# Patient Record
Sex: Female | Born: 1945 | Race: White | Hispanic: No | Marital: Married | Smoking: Former smoker
Health system: Southern US, Community
[De-identification: ages and names within clinical notes are randomized; demographics above are authoritative.]

## PROBLEM LIST (undated history)

## (undated) DIAGNOSIS — M8580 Other specified disorders of bone density and structure, unspecified site: Secondary | ICD-10-CM

## (undated) DIAGNOSIS — K219 Gastro-esophageal reflux disease without esophagitis: Secondary | ICD-10-CM

## (undated) DIAGNOSIS — E079 Disorder of thyroid, unspecified: Secondary | ICD-10-CM

## (undated) DIAGNOSIS — I10 Essential (primary) hypertension: Secondary | ICD-10-CM

## (undated) HISTORY — DX: Disorder of thyroid, unspecified: E07.9

## (undated) HISTORY — DX: Gastro-esophageal reflux disease without esophagitis: K21.9

## (undated) HISTORY — PX: ROTATOR CUFF REPAIR: SHX139

## (undated) HISTORY — DX: Essential (primary) hypertension: I10

## (undated) HISTORY — PX: TONSILLECTOMY: SUR1361

## (undated) HISTORY — DX: Other specified disorders of bone density and structure, unspecified site: M85.80

## (undated) HISTORY — PX: VEIN LIGATION AND STRIPPING: SHX2653

## (undated) HISTORY — PX: EAR TUBE REMOVAL: SHX1486

---

## 1998-08-21 ENCOUNTER — Other Ambulatory Visit: Admission: RE | Admit: 1998-08-21 | Discharge: 1998-08-21 | Payer: Self-pay | Admitting: Family Medicine

## 1999-09-05 ENCOUNTER — Other Ambulatory Visit: Admission: RE | Admit: 1999-09-05 | Discharge: 1999-09-05 | Payer: Self-pay | Admitting: Family Medicine

## 2000-09-17 ENCOUNTER — Other Ambulatory Visit: Admission: RE | Admit: 2000-09-17 | Discharge: 2000-09-17 | Payer: Self-pay | Admitting: Family Medicine

## 2002-10-04 ENCOUNTER — Other Ambulatory Visit: Admission: RE | Admit: 2002-10-04 | Discharge: 2002-10-04 | Payer: Self-pay | Admitting: Family Medicine

## 2003-02-14 ENCOUNTER — Encounter: Admission: RE | Admit: 2003-02-14 | Discharge: 2003-03-14 | Payer: Self-pay

## 2003-05-18 ENCOUNTER — Encounter: Admission: RE | Admit: 2003-05-18 | Discharge: 2003-06-14 | Payer: Self-pay | Admitting: Family Medicine

## 2003-10-17 ENCOUNTER — Other Ambulatory Visit: Admission: RE | Admit: 2003-10-17 | Discharge: 2003-10-17 | Payer: Self-pay | Admitting: Family Medicine

## 2004-10-29 ENCOUNTER — Other Ambulatory Visit: Admission: RE | Admit: 2004-10-29 | Discharge: 2004-10-29 | Payer: Self-pay | Admitting: Family Medicine

## 2005-10-23 ENCOUNTER — Ambulatory Visit: Payer: Self-pay | Admitting: Cardiology

## 2005-11-18 ENCOUNTER — Other Ambulatory Visit: Admission: RE | Admit: 2005-11-18 | Discharge: 2005-11-18 | Payer: Self-pay | Admitting: Family Medicine

## 2006-01-04 ENCOUNTER — Encounter: Admission: RE | Admit: 2006-01-04 | Discharge: 2006-01-04 | Payer: Self-pay | Admitting: Specialist

## 2006-03-12 ENCOUNTER — Ambulatory Visit (HOSPITAL_COMMUNITY): Admission: RE | Admit: 2006-03-12 | Discharge: 2006-03-13 | Payer: Self-pay | Admitting: Specialist

## 2006-04-07 ENCOUNTER — Encounter: Admission: RE | Admit: 2006-04-07 | Discharge: 2006-07-03 | Payer: Self-pay | Admitting: Specialist

## 2009-09-24 ENCOUNTER — Encounter (INDEPENDENT_AMBULATORY_CARE_PROVIDER_SITE_OTHER): Payer: Self-pay

## 2009-09-25 ENCOUNTER — Ambulatory Visit: Payer: Self-pay | Admitting: Internal Medicine

## 2009-09-27 ENCOUNTER — Telehealth: Payer: Self-pay | Admitting: Internal Medicine

## 2009-10-03 ENCOUNTER — Ambulatory Visit: Payer: Self-pay | Admitting: Internal Medicine

## 2010-07-28 ENCOUNTER — Encounter: Payer: Self-pay | Admitting: Specialist

## 2010-08-06 NOTE — Miscellaneous (Signed)
Summary: Lec previsit  Clinical Lists Changes  Medications: Added new medication of MIRALAX   POWD (POLYETHYLENE GLYCOL 3350) As per prep  instructions. - Signed Added new medication of REGLAN 10 MG  TABS (METOCLOPRAMIDE HCL) As per prep instructions. - Signed Added new medication of DULCOLAX 5 MG  TBEC (BISACODYL) Day before procedure take 2 at 3pm and 2 at 8pm. - Signed Rx of MIRALAX   POWD (POLYETHYLENE GLYCOL 3350) As per prep  instructions.;  #255gm x 0;  Signed;  Entered by: Ulis Rias RN;  Authorized by: Hart Carwin MD;  Method used: Electronically to [facility] [phone]*, [address], [place], [place], [place]  [address], Ph: [phone] or [phone], Fax: [phone] Rx of REGLAN 10 MG  TABS (METOCLOPRAMIDE HCL) As per prep instructions.;  #2 x 0;  Signed;  Entered by: Ulis Rias RN;  Authorized by: Hart Carwin MD;  Method used: Electronically to [facility] [phone]*, [address] [place], [place], [place]  [address], Ph: [phone] or [phone], Fax: [phone] Rx of DULCOLAX 5 MG  TBEC (BISACODYL) Day before procedure take 2 at 3pm and 2 at 8pm.;  #4 x 0;  Signed;  Entered by: Ulis Rias RN;  Authorized by: Hart Carwin MD;  Method used: Electronically to [facility] [phone]*, [address], [place], [place], [place]  [address], Ph: [phone] or [phone], Fax: [phone] Allergies: Added new allergy or adverse reaction of * SULFUR Added new allergy or adverse reaction of NEOMYCIN Observations: Added new observation of NKA: F (09/25/2009 13:22)    Prescriptions: DULCOLAX 5 MG  TBEC (BISACODYL) Day before procedure take 2 at 3pm and 2 at 8pm.  #4 x 0   Entered by:   Ulis Rias RN   Authorized by:   Hart Carwin MD   Signed by:   Ulis Rias RN on 09/25/2009   Method used:   Electronically to        [facility] [phone]* (retail)       [address] [place], [place]   [address]       Ph: [phone] or [phone]       Fax: [phone]   RxID:   [id number] REGLAN 10 MG  TABS (METOCLOPRAMIDE HCL) As per prep instructions.  #2 x 0   Entered by:   Ulis Rias RN   Authorized by:   Hart Carwin MD   Signed by:   Ulis Rias RN on 09/25/2009   Method used:   Electronically to        [facility] [phone]* (retail)       [address] [place], [place]  [address]       Ph: [phone] or [phone]       Fax: [phone]   RxID:   [id number] MIRALAX   POWD (POLYETHYLENE GLYCOL 3350) As per prep  instructions.  #255gm x 0   Entered by:   Ulis Rias RN   Authorized by:   Hart Carwin MD   Signed by:   Ulis Rias RN on 09/25/2009   Method used:   Electronically to        [facility] [phone]* (retail)       [address] [place]  [place], [place]  [address]       Ph: [phone] or [phone]       Fax: [phone]   RxID:   [id number]

## 2010-08-06 NOTE — Procedures (Signed)
Summary: Colonoscopy  Patient: Abigail Moss Note: All result statuses are Final unless otherwise noted.  Tests: (1) Colonoscopy (COL)   COL Colonoscopy           DONE     [facility]     [address]     [place], [place]  [address]           COLONOSCOPY PROCEDURE REPORT           PATIENT:  Abigail Moss, Abigail Moss  MR#:  [id number]     BIRTHDATE:  01-04-1946, 63 yrs. old  GENDER:  female     ENDOSCOPIST:  Hedwig Morton. Juanda Chance, MD     REF. BY:  Rudi Heap, M.D.     PROCEDURE DATE:  10/03/2009     PROCEDURE:  Colonoscopy 19147     ASA CLASS:  Class I     INDICATIONS:  Routine Risk Screening     MEDICATIONS:   Versed 9 mg, Fentanyl 75 mcg           DESCRIPTION OF PROCEDURE:   After the risks benefits and     alternatives of the procedure were thoroughly explained, informed     consent was obtained.  Digital rectal exam was performed and     revealed no rectal masses.   The LB CF-[id number] endoscope     was introduced through the anus and advanced to the cecum, which     was identified by both the appendix and ileocecal valve, without     limitations.  The quality of the prep was good, using MiraLax.     The instrument was then slowly withdrawn as the colon was fully     examined.     <<PROCEDUREIMAGES>>           FINDINGS:  Internal hemorrhoids were found (see image10).     Moderate diverticulosis was found in the sigmoid colon (see image9,     image8, image7, image2, and image1).  This was otherwise a normal     examination of the colon (see image3, image4, image5, and image6).     Retroflexed views in the rectum revealed no abnormalities.    The     scope was then withdrawn from the patient and the procedure     completed.           COMPLICATIONS:  None     ENDOSCOPIC IMPRESSION:     1) Internal hemorrhoids     2) Moderate diverticulosis in the sigmoid colon     3) Otherwise normal examination     RECOMMENDATIONS:     1) high fiber diet     REPEAT EXAM:  In 10 year(s)  for.           ______________________________     Hedwig Morton. Juanda Chance, MD           CC:           n.     eSIGNED:   Hedwig Morton. Jadie Comas at 10/03/2009 12:05 PM           Consuelo Pandy, [phone]  Note: An exclamation mark (!) indicates a result that was not dispersed into the flowsheet. Document Creation Date: 10/03/2009 12:06 PM _______________________________________________________________________  (1) Order result status: Final Collection or observation date-time: 10/03/2009 11:54 Requested date-time:  Receipt date-time:  Reported date-time:  Referring Physician:   Ordering Physician: Lina Sar [phone]) Specimen Source:  Source: Launa Grill Order Number: [id number] Lab site:  Appended Document: Colonoscopy    Clinical Lists Changes  Observations: Added new observation of COLONNXTDUE: 09/2019 (10/03/2009 12:47)

## 2010-08-06 NOTE — Letter (Signed)
Summary: [facility] Instructions  [facility]  [address] [place], [place] [address]   Phone: [phone]  Fax: [phone]       Abigail Moss    18-Dec-1945    MRN: [id number]       Procedure Day Dorna Bloom:  Wednesday 10/03/2009     Arrival Time: 10:30 am_     Procedure Time:  11:30 am     Location of Procedure:                    _ x_  [facility] (4th Floor)    PREPARATION FOR COLONOSCOPY WITH MIRALAX  Starting 5 days prior to your procedure Friday 3/25 do not eat nuts, seeds, popcorn, corn, beans, peas,  salads, or any raw vegetables.  Do not take any fiber supplements (e.g. Metamucil, Citrucel, and Benefiber). ____________________________________________________________________________________________________   THE DAY BEFORE YOUR PROCEDURE         DATE: Tuesday 3/29  1   Drink clear liquids the entire day-NO SOLID FOOD  2   Do not drink anything colored red or purple.  Avoid juices with pulp.  No orange juice.  3   Drink at least 64 oz. (8 glasses) of fluid/clear liquids during the day to prevent dehydration and help the prep work efficiently.  CLEAR LIQUIDS INCLUDE: Water Jello Ice Popsicles Tea (sugar ok, no milk/cream) Powdered fruit flavored drinks Coffee (sugar ok, no milk/cream) Gatorade Juice: apple, white grape, white cranberry  Lemonade Clear bullion, consomm, broth Carbonated beverages (any kind) Strained chicken noodle soup Hard Candy  4   Mix the entire bottle of Miralax with 64 oz. of Gatorade/Powerade in the morning and put in the refrigerator to chill.  5   At 3:00 pm take 2 Dulcolax/Bisacodyl tablets.  6   At 4:30 pm take one Reglan/Metoclopramide tablet.  7  Starting at 5:00 pm drink one 8 oz glass of the Miralax mixture every 15-20 minutes until you have finished drinking the entire 64 oz.  You should finish drinking prep around 7:30 or 8:00 pm.  8   If you are nauseated, you may take the 2nd Reglan/Metoclopramide  tablet at 6:30 pm.        9    At 8:00 pm take 2 more DULCOLAX/Bisacodyl tablets.     THE DAY OF YOUR PROCEDURE      DATE:  Wednesday 3/30  You may drink clear liquids until 9:30 am  (2 HOURS BEFORE PROCEDURE).   MEDICATION INSTRUCTIONS  Unless otherwise instructed, you should take regular prescription medications with a small sip of water as early as possible the morning of your procedure.           OTHER INSTRUCTIONS  You will need a responsible adult at least 67 years of age to accompany you and drive you home.   This person must remain in the waiting room during your procedure.  Wear loose fitting clothing that is easily removed.  Leave jewelry and other valuables at home.  However, you may wish to bring a book to read or an iPod/MP3 player to listen to music as you wait for your procedure to start.  Remove all body piercing jewelry and leave at home.  Total time from sign-in until discharge is approximately 2-3 hours.  You should go home directly after your procedure and rest.  You can resume normal activities the day after your procedure.  The day of your procedure you should not:   Drive  Make legal decisions   Operate machinery   Drink alcohol   Return to work  You will receive specific instructions about eating, activities and medications before you leave.   The above instructions have been reviewed and explained to me by   Ulis Rias RN  September 25, 2009 2:00 PM     I fully understand and can verbalize these instructions _____________________________ Date _______

## 2010-08-06 NOTE — Progress Notes (Signed)
Summary: Questions about paperwork  Phone Note Call from Patient Call back at Home Phone [phone]   Caller: Patient Call For: Dr. Juanda Chance Summary of Call: Pt. has some questions about the paper work she signed for her procedure.  Initial call taken by: Karna Christmas,  September 27, 2009 8:23 AM  Follow-up for Phone Call        Patient was concerned that someone other than our staff would be in the room while she had her procedure.  She stated that she didn't want any one else in the room.  She also stated that she didn't want her name published in any journal.  I assured her that we needed permission to do that and she was ok. Follow-up by: Clide Cliff RN,  September 27, 2009 9:14 AM

## 2010-11-22 NOTE — Op Note (Signed)
NAMEPEYTON, Abigail Moss                  ACCOUNT NO.:  [id number]   MEDICAL RECORD NO.:  [id number]          PATIENT TYPE:  AMB   LOCATION:  DAY                          FACILITY:  [facility]   PHYSICIAN:  Jene Every, M.D.    DATE OF BIRTH:  [birth year]   DATE OF PROCEDURE:  03/12/2006  DATE OF DISCHARGE:                                 OPERATIVE REPORT   PREOPERATIVE DIAGNOSIS:  Rotator cuff tear, impingement syndrome of the left  shoulder.   POSTOPERATIVE DIAGNOSIS:  Rotator cuff tear, impingement syndrome of the  left shoulder.   PROCEDURE:  1. Open rotator cuff repair.  2. Acromioplasty.  3. Bursectomy.  4. Lysis of adhesions.  5. Exam under anesthesia.   BRIEF HISTORY AND INDICATIONS:  This is a 65 year old with refractory  shoulder pain with a MRI indicating a full-thickness rotator cuff tear.  Operative intervention was indicated for repair of the cuff. Risks and  benefits discussed including bleeding, infection, no change in symptoms,  worsening of symptoms, adhesive capsulitis, need for repeat repair, etc.   TECHNIQUE:  The patient in supine position. After induction of adequate  general anesthesia, 1 g of Kefzol placed in the beach-chair position. The  left shoulder and upper extremity was prepped and draped in the usual  sterile fashion.   An incision was made over the anterior aspect of the acromion, and along  this line, subcutaneous tissue was dissected. Electrocautery was utilized to  achieve hemostasis. Raphe between the anterior and lateral heads of the  deltoid was identified, divided and subperiosteally elevated from the  anterior and anterolateral aspect of the acromion. CA ligament was attached  and excised. A rongeur and a 3-mm Kerrison was utilized to perform an  acromioplasty of an anterolateral spur off of the acromion. Bursa was  incised. Inspection reveals a full-thickness rotator cuff tear 1 cm in  slight retraction. The edges were debrided. Trough was  fashioned with  a  rongeur. Tendon was advanced. We digitally lysed the subacromial space prior  to that. We ranged her as well, and she had good range of motion. Two Mitek  suture anchors were placed in the greater tuberosity, and the tendon was  delivered into the bed and sutured. Good range of motion with no tension on  the cuff. There was full coverage noted. Wound cuff was irrigated. I  repaired the raphe with #1 Vicryl interrupted figure-of-eight sutures to and  through the acromion. Subcutaneous tissues were reapproximated with 2-0  Vicryl simple sutures. Skin was reapproximated with 4-0  subcuticular Prolene. Wound reinforced with Steri-Strips and sterile  dressing applied. I placed in a sling, extubated without difficulty and  transported to the recovery room in satisfactory condition.   The patient tolerated the procedure well with no complications.      Jene Every, M.D.  Electronically Signed     JB/MEDQ  D:  03/12/2006  T:  03/12/2006  Job:  [id number]

## 2010-11-22 NOTE — Op Note (Signed)
Abigail Moss, RIDGELY                  ACCOUNT NO.:  [id number]   MEDICAL RECORD NO.:  [id number]          PATIENT TYPE:  OIB   LOCATION:  1507                         FACILITY:  [facility]   PHYSICIAN:  Jene Every, M.D.    DATE OF BIRTH:  February 02, 1946   DATE OF PROCEDURE:  03/12/2006  DATE OF DISCHARGE:  03/13/2006                                 OPERATIVE REPORT   ADDENDUM:  :   SURGEON:  Jene Every, M.D.   ASSISTANT:  Roma Schanz, P.A.      Jene Every, M.D.  Electronically Signed     JB/MEDQ  D:  03/17/2006  T:  03/17/2006  Job:  [id number]

## 2012-12-07 ENCOUNTER — Telehealth: Payer: Self-pay | Admitting: Family Medicine

## 2012-12-07 NOTE — Telephone Encounter (Signed)
APPT MADE

## 2012-12-08 ENCOUNTER — Ambulatory Visit (INDEPENDENT_AMBULATORY_CARE_PROVIDER_SITE_OTHER): Payer: Medicare Other | Admitting: Physician Assistant

## 2012-12-08 ENCOUNTER — Encounter: Payer: Self-pay | Admitting: Physician Assistant

## 2012-12-08 VITALS — BP 134/86 | HR 68 | Temp 98.8°F | Ht 78.5 in | Wt 182.0 lb

## 2012-12-08 DIAGNOSIS — J329 Chronic sinusitis, unspecified: Secondary | ICD-10-CM

## 2012-12-08 DIAGNOSIS — K219 Gastro-esophageal reflux disease without esophagitis: Secondary | ICD-10-CM | POA: Insufficient documentation

## 2012-12-08 DIAGNOSIS — I10 Essential (primary) hypertension: Secondary | ICD-10-CM | POA: Insufficient documentation

## 2012-12-08 DIAGNOSIS — J309 Allergic rhinitis, unspecified: Secondary | ICD-10-CM

## 2012-12-08 MED ORDER — PREDNISONE 10 MG PO TABS: 10.0000 mg | ORAL_TABLET | Freq: Every day | ORAL | Status: DC

## 2012-12-08 MED ORDER — AZITHROMYCIN 250 MG PO TABS: 250.0000 mg | ORAL_TABLET | Freq: Every day | ORAL | Status: DC

## 2012-12-08 MED ORDER — CETIRIZINE HCL 10 MG PO TABS: 10.0000 mg | ORAL_TABLET | Freq: Every day | ORAL | Status: DC

## 2012-12-08 NOTE — Patient Instructions (Signed)

## 2012-12-08 NOTE — Progress Notes (Signed)
Subjective:     Patient ID: Abigail Moss, female   DOB: [birth year], 67 y.o.   MRN: [id number]  HPI Pt with long hx of intermit chronic sinusitis She has had progressive sinus pressure and now pain Pt has used her reg meds but sx cont Denies fever/chills  Review of Systems  All other systems reviewed and are negative.       Objective:   Physical Exam  Nursing note and vitals reviewed.  Ears- canals nl, TM with blue tubes bilat Oral- no lesions, no tonsils No cerv nodes Heart- RRR w/o M Lungs-CTA + maxillary, frontal sinus TTP    Assessment:     1. Sinusitis   2. Rhinitis, allergic        Plan:     Zyrtec rf done today She has prev done well on Zpack on 6 day Pred pack so also repeated Nasonex sample given F/U prn

## 2013-03-08 ENCOUNTER — Other Ambulatory Visit: Payer: Self-pay | Admitting: Family Medicine

## 2013-03-10 NOTE — Telephone Encounter (Signed)
Last seen 09/02/12  MMM

## 2013-03-15 ENCOUNTER — Other Ambulatory Visit: Payer: Self-pay

## 2013-03-15 MED ORDER — LOSARTAN POTASSIUM 50 MG PO TABS: 50.0000 mg | ORAL_TABLET | Freq: Every day | ORAL | Status: DC

## 2013-03-18 ENCOUNTER — Other Ambulatory Visit: Payer: Self-pay | Admitting: *Deleted

## 2013-03-18 MED ORDER — BUPROPION HCL ER (XL) 300 MG PO TB24: 300.0000 mg | ORAL_TABLET | Freq: Every day | ORAL | Status: DC

## 2013-03-23 ENCOUNTER — Telehealth: Payer: Self-pay | Admitting: Nurse Practitioner

## 2013-03-23 ENCOUNTER — Ambulatory Visit (INDEPENDENT_AMBULATORY_CARE_PROVIDER_SITE_OTHER): Payer: Medicare Other | Admitting: Nurse Practitioner

## 2013-03-23 ENCOUNTER — Encounter: Payer: Self-pay | Admitting: Nurse Practitioner

## 2013-03-23 VITALS — BP 155/93 | HR 72 | Temp 97.4°F | Ht 66.5 in | Wt 186.0 lb

## 2013-03-23 DIAGNOSIS — R599 Enlarged lymph nodes, unspecified: Secondary | ICD-10-CM

## 2013-03-23 MED ORDER — AMOXICILLIN 875 MG PO TABS: 875.0000 mg | ORAL_TABLET | Freq: Two times a day (BID) | ORAL | Status: DC

## 2013-03-23 MED ORDER — OMEPRAZOLE 40 MG PO CPDR: 40.0000 mg | DELAYED_RELEASE_CAPSULE | Freq: Every day | ORAL | Status: DC

## 2013-03-23 NOTE — Telephone Encounter (Signed)
Lymph node enlargement under left ear. Has some discomfort in the area when she eats or drinks. Denies throat pain. Symptoms began last night. Has had this problem in the past and was prescribed antibiotics.   Appt scheduled.  Patient aware.

## 2013-03-23 NOTE — Telephone Encounter (Signed)
appt scheduled

## 2013-03-23 NOTE — Progress Notes (Signed)
  Subjective:    Patient ID: Abigail Moss, female    DOB: [birth year], 67 y.o.   MRN: [id number]  HPI Patient in C/o swollen lymph node on right- slight pain in right ear- Slightly painful to swallow- Noticed last night- This has happened in the past and antibiotics helped.    Review of Systems  Constitutional: Negative for fever, activity change, appetite change and fatigue.  HENT: Positive for ear pain (slight). Negative for hearing loss, sore throat, sneezing, trouble swallowing, neck stiffness, voice change and sinus pressure.   Respiratory: Negative for cough, choking and shortness of breath.   Cardiovascular: Negative.   Gastrointestinal: Negative.        Objective:   Physical Exam  Constitutional: She appears well-developed and well-nourished.  HENT:  Head: Normocephalic.  Right Ear: External ear normal.  Left Ear: External ear normal.  Nose: Nose normal.  Mouth/Throat: Oropharynx is clear and moist.  Eyes: EOM are normal. Pupils are equal, round, and reactive to light.  Neck: Normal range of motion. Neck supple.  Cardiovascular: Normal rate and normal heart sounds.   Pulmonary/Chest: Effort normal and breath sounds normal.  Lymphadenopathy:    She has cervical adenopathy (right anterior cervical chain).          Assessment & Plan:  1. Enlargement of lymph nodes Keep close check on area If doesn't resolve with antibiotic will need to do u/s - amoxicillin (AMOXIL) 875 MG tablet; Take 1 tablet (875 mg total) by mouth 2 (two) times daily.  Dispense: 20 tablet; Refill: 0  Mary-Margaret Daphine Deutscher, FNP

## 2013-04-07 ENCOUNTER — Encounter: Payer: Self-pay | Admitting: General Practice

## 2013-04-07 ENCOUNTER — Ambulatory Visit (INDEPENDENT_AMBULATORY_CARE_PROVIDER_SITE_OTHER): Payer: Medicare Other | Admitting: General Practice

## 2013-04-07 DIAGNOSIS — Z23 Encounter for immunization: Secondary | ICD-10-CM

## 2013-04-07 DIAGNOSIS — Z124 Encounter for screening for malignant neoplasm of cervix: Secondary | ICD-10-CM

## 2013-04-07 DIAGNOSIS — J309 Allergic rhinitis, unspecified: Secondary | ICD-10-CM

## 2013-04-07 DIAGNOSIS — Z0000 Encounter for general adult medical examination without abnormal findings: Secondary | ICD-10-CM

## 2013-04-07 DIAGNOSIS — F411 Generalized anxiety disorder: Secondary | ICD-10-CM

## 2013-04-07 DIAGNOSIS — E039 Hypothyroidism, unspecified: Secondary | ICD-10-CM

## 2013-04-07 DIAGNOSIS — K219 Gastro-esophageal reflux disease without esophagitis: Secondary | ICD-10-CM

## 2013-04-07 DIAGNOSIS — F329 Major depressive disorder, single episode, unspecified: Secondary | ICD-10-CM

## 2013-04-07 DIAGNOSIS — I10 Essential (primary) hypertension: Secondary | ICD-10-CM

## 2013-04-07 DIAGNOSIS — J302 Other seasonal allergic rhinitis: Secondary | ICD-10-CM

## 2013-04-07 LAB — POCT UA - MICROSCOPIC ONLY
Casts, Ur, LPF, POC: NEGATIVE
Crystals, Ur, HPF, POC: NEGATIVE
Epithelial cells, urine per micros: NEGATIVE
Mucus, UA: NEGATIVE
Yeast, UA: NEGATIVE

## 2013-04-07 LAB — POCT CBC
Hemoglobin: 13 g/dL (ref 12.2–16.2)
Lymph, poc: 1.5 (ref 0.6–3.4)
MCH, POC: 31.7 pg — AB (ref 27–31.2)
MCHC: 34.3 g/dL (ref 31.8–35.4)
MCV: 92.2 fL (ref 80–97)
Platelet Count, POC: 251 10*3/uL (ref 142–424)
RBC: 4.1 M/uL (ref 4.04–5.48)

## 2013-04-07 LAB — POCT URINALYSIS DIPSTICK
Ketones, UA: NEGATIVE
Protein, UA: NEGATIVE
Spec Grav, UA: 1.015
Urobilinogen, UA: NEGATIVE
pH, UA: 5

## 2013-04-07 MED ORDER — FLUTICASONE PROPIONATE 50 MCG/ACT NA SUSP: 2.0000 | Freq: Every day | NASAL | Status: DC

## 2013-04-07 MED ORDER — LOSARTAN POTASSIUM 50 MG PO TABS: 50.0000 mg | ORAL_TABLET | Freq: Every day | ORAL | Status: DC

## 2013-04-07 MED ORDER — BUPROPION HCL ER (XL) 300 MG PO TB24: 300.0000 mg | ORAL_TABLET | Freq: Every day | ORAL | Status: DC

## 2013-04-07 MED ORDER — OMEPRAZOLE 40 MG PO CPDR: 40.0000 mg | DELAYED_RELEASE_CAPSULE | Freq: Every day | ORAL | Status: DC

## 2013-04-07 MED ORDER — LEVOTHYROXINE SODIUM 137 MCG PO TABS: 137.0000 ug | ORAL_TABLET | Freq: Every day | ORAL | Status: DC

## 2013-04-07 MED ORDER — ALPRAZOLAM 0.5 MG PO TABS: 0.5000 mg | ORAL_TABLET | Freq: Every evening | ORAL | Status: DC | PRN

## 2013-04-07 MED ORDER — MONTELUKAST SODIUM 10 MG PO TABS: 10.0000 mg | ORAL_TABLET | Freq: Every day | ORAL | Status: DC

## 2013-04-07 NOTE — Progress Notes (Signed)
Subjective:    Patient ID: Abigail Moss, female    DOB: 05/25/1946, 67 y.o.   MRN: [id number]  HPI Patient presents today for annual physical. She has a history of anxiety, depression, asthma, allergic rhinitis, hypothyroidism, hypertension, and GERD. Elvis verbalized anxiety and mood is well controlled with medications. Reports checking blood pressures at home and range 120's/70's. She reports taking medications as prescribed. Reports eating a regular diet and walks for exercise daily. Denies having concerns or issues that she wishes to discuss.     Review of Systems  Constitutional: Negative for fever and chills.  HENT: Negative for ear pain, neck pain and neck stiffness.   Respiratory: Negative for chest tightness, shortness of breath and wheezing.   Cardiovascular: Negative for chest pain and palpitations.  Gastrointestinal: Negative for nausea, vomiting, abdominal pain, diarrhea, constipation and blood in stool.  Genitourinary: Negative for dysuria, hematuria, flank pain and difficulty urinating.  Musculoskeletal: Negative for back pain.       Right shoulder pain, due to rotator cuff tear/surgery in the past  Neurological: Negative for dizziness, weakness and headaches.       Objective:   Physical Exam  Constitutional: She is oriented to person, place, and time. She appears well-developed and well-nourished.  HENT:  Head: Normocephalic and atraumatic.  Right Ear: External ear normal.  Left Ear: External ear normal.  Mouth/Throat: Oropharynx is clear and moist.  Eyes: Conjunctivae and EOM are normal. Pupils are equal, round, and reactive to light.  Neck: Normal range of motion. Neck supple. No thyromegaly present.  Cardiovascular: Normal rate, regular rhythm, normal heart sounds and intact distal pulses.   Pulmonary/Chest: Effort normal and breath sounds normal. No respiratory distress. Right breast exhibits no inverted nipple, no mass, no nipple discharge, no skin change and no  tenderness. Left breast exhibits no inverted nipple, no mass, no nipple discharge, no skin change and no tenderness. Breasts are symmetrical.  Abdominal: Soft. Bowel sounds are normal. She exhibits no distension.  Genitourinary: Vagina normal and uterus normal. No breast swelling, tenderness, discharge or bleeding. No labial fusion. There is no rash, tenderness, lesion or injury on the right labia. There is no rash, tenderness, lesion or injury on the left labia. Uterus is not deviated, not enlarged, not fixed and not tender. Cervix exhibits no motion tenderness, no discharge and no friability. Right adnexum displays no mass, no tenderness and no fullness. Left adnexum displays no mass, no tenderness and no fullness. No erythema, tenderness or bleeding around the vagina. No foreign body around the vagina. No signs of injury around the vagina. No vaginal discharge found.  Lymphadenopathy:    She has no cervical adenopathy.  Neurological: She is alert and oriented to person, place, and time.  Skin: Skin is warm and dry.  Psychiatric: She has a normal mood and affect.          Assessment & Plan:  1. Annual physical exam  - POCT urinalysis dipstick - POCT UA - Microscopic Only - POCT CBC  2. Hypertension  - NMR, lipoprofile - CMP14+EGFR - losartan (COZAAR) 50 MG tablet; Take 1 tablet (50 mg total) by mouth daily.  Dispense: 90 tablet; Refill: 1  3. Unspecified hypothyroidism  - Thyroid Panel With TSH - levothyroxine (SYNTHROID, LEVOTHROID) 137 MCG tablet; Take 1 tablet (137 mcg total) by mouth daily before breakfast.  Dispense: 90 tablet; Refill: 1  4. Anxiety state, unspecified  - ALPRAZolam (XANAX) 0.5 MG tablet; Take 1 tablet (0.5 mg total)  by mouth at bedtime as needed for sleep.  Dispense: 30 tablet; Refill: 0  5. GERD (gastroesophageal reflux disease)  - omeprazole (PRILOSEC) 40 MG capsule; Take 1 capsule (40 mg total) by mouth daily.  Dispense: 90 capsule; Refill: 1  6.  Depression  - buPROPion (WELLBUTRIN XL) 300 MG 24 hr tablet; Take 1 tablet (300 mg total) by mouth daily.  Dispense: 90 tablet; Refill: 1  7. Seasonal allergic rhinitis  - montelukast (SINGULAIR) 10 MG tablet; Take 1 tablet (10 mg total) by mouth at bedtime.  Dispense: 90 tablet; Refill: 1 - fluticasone (FLONASE) 50 MCG/ACT nasal spray; Place 2 sprays into the nose daily.  Dispense: 16 g; Refill: 3  8. Need for prophylactic vaccination and inoculation against influenza Continue all current medications Labs pending F/u in 3 months Discussed exercise and diet  Patient verbalized understanding Coralie Keens, FNP-C

## 2013-04-09 LAB — THYROID PANEL WITH TSH
Free Thyroxine Index: 3.1 (ref 1.2–4.9)
T3 Uptake Ratio: 28 % (ref 24–39)

## 2013-04-09 LAB — CMP14+EGFR
ALT: 19 IU/L (ref 0–32)
AST: 19 IU/L (ref 0–40)
Albumin: 4.3 g/dL (ref 3.6–4.8)
Alkaline Phosphatase: 67 IU/L (ref 39–117)
BUN: 16 mg/dL (ref 8–27)
GFR calc Af Amer: 80 mL/min/{1.73_m2} (ref >59)
GFR calc non Af Amer: 69 mL/min/{1.73_m2} (ref >59)
Glucose: 78 mg/dL (ref 65–99)
Sodium: 143 mmol/L (ref 134–144)
Total Protein: 6.9 g/dL (ref 6.0–8.5)

## 2013-04-09 LAB — NMR, LIPOPROFILE
HDL Cholesterol by NMR: 53 mg/dL (ref >=40)
LDLC SERPL CALC-MCNC: 108 mg/dL — ABNORMAL HIGH (ref <100)
Triglycerides by NMR: 124 mg/dL (ref <150)

## 2013-04-12 ENCOUNTER — Other Ambulatory Visit: Payer: Self-pay | Admitting: General Practice

## 2013-04-12 DIAGNOSIS — R7989 Other specified abnormal findings of blood chemistry: Secondary | ICD-10-CM

## 2013-04-12 DIAGNOSIS — E039 Hypothyroidism, unspecified: Secondary | ICD-10-CM

## 2013-04-12 LAB — PAP IG W/ RFLX HPV ASCU: PAP Smear Comment: 0

## 2013-04-12 MED ORDER — LEVOTHYROXINE SODIUM 125 MCG PO TABS: 137.0000 ug | ORAL_TABLET | Freq: Every day | ORAL | Status: DC

## 2013-04-18 ENCOUNTER — Telehealth: Payer: Self-pay | Admitting: General Practice

## 2013-04-18 NOTE — Telephone Encounter (Signed)
Returning call about labs

## 2013-04-26 ENCOUNTER — Other Ambulatory Visit: Payer: Self-pay

## 2013-04-26 NOTE — Telephone Encounter (Signed)
Last seen 04/07/13  Mae  Not on [facility] list

## 2013-04-27 MED ORDER — DM-GUAIFENESIN ER 30-600 MG PO TB12: 1.0000 | ORAL_TABLET | Freq: Two times a day (BID) | ORAL | Status: DC

## 2013-05-03 ENCOUNTER — Encounter: Payer: Self-pay | Admitting: *Deleted

## 2013-05-04 ENCOUNTER — Other Ambulatory Visit: Payer: Self-pay | Admitting: *Deleted

## 2013-05-05 ENCOUNTER — Other Ambulatory Visit: Payer: Self-pay

## 2013-05-05 NOTE — Telephone Encounter (Signed)
Last seen 04/07/13  Abigail Moss  This was not on [facility]

## 2013-05-16 ENCOUNTER — Ambulatory Visit (INDEPENDENT_AMBULATORY_CARE_PROVIDER_SITE_OTHER): Payer: Medicare Other | Admitting: General Practice

## 2013-05-16 ENCOUNTER — Encounter: Payer: Self-pay | Admitting: General Practice

## 2013-05-16 VITALS — BP 132/88 | HR 81 | Temp 98.2°F | Ht 66.5 in | Wt 188.5 lb

## 2013-05-16 DIAGNOSIS — N390 Urinary tract infection, site not specified: Secondary | ICD-10-CM

## 2013-05-16 DIAGNOSIS — R300 Dysuria: Secondary | ICD-10-CM

## 2013-05-16 DIAGNOSIS — J329 Chronic sinusitis, unspecified: Secondary | ICD-10-CM

## 2013-05-16 LAB — POCT UA - MICROSCOPIC ONLY
Casts, Ur, LPF, POC: NEGATIVE
Crystals, Ur, HPF, POC: NEGATIVE
RBC, urine, microscopic: NEGATIVE

## 2013-05-16 LAB — POCT URINALYSIS DIPSTICK
Blood, UA: NEGATIVE
Ketones, UA: NEGATIVE
Nitrite, UA: NEGATIVE
Protein, UA: NEGATIVE
Spec Grav, UA: <=1.005
Urobilinogen, UA: NEGATIVE
pH, UA: 5

## 2013-05-16 MED ORDER — PHENAZOPYRIDINE HCL 200 MG PO TABS: 200.0000 mg | ORAL_TABLET | Freq: Three times a day (TID) | ORAL | Status: DC | PRN

## 2013-05-16 MED ORDER — AZITHROMYCIN 250 MG PO TABS: ORAL_TABLET | ORAL | Status: DC

## 2013-05-16 MED ORDER — DM-GUAIFENESIN ER 30-600 MG PO TB12: 1.0000 | ORAL_TABLET | Freq: Two times a day (BID) | ORAL | Status: DC

## 2013-05-16 NOTE — Patient Instructions (Signed)
Urinary Tract Infection  Urinary tract infections (UTIs) can develop anywhere along your urinary tract. Your urinary tract is your body's drainage system for removing wastes and extra water. Your urinary tract includes two kidneys, two ureters, a bladder, and a urethra. Your kidneys are a pair of bean-shaped organs. Each kidney is about the size of your fist. They are located below your ribs, one on each side of your spine.  CAUSES  Infections are caused by microbes, which are microscopic organisms, including fungi, viruses, and bacteria. These organisms are so small that they can only be seen through a microscope. Bacteria are the microbes that most commonly cause UTIs.  SYMPTOMS   Symptoms of UTIs may vary by age and gender of the patient and by the location of the infection. Symptoms in young women typically include a frequent and intense urge to urinate and a painful, burning feeling in the bladder or urethra during urination. Older women and men are more likely to be tired, shaky, and weak and have muscle aches and abdominal pain. A fever may mean the infection is in your kidneys. Other symptoms of a kidney infection include pain in your back or sides below the ribs, nausea, and vomiting.  DIAGNOSIS  To diagnose a UTI, your caregiver will ask you about your symptoms. Your caregiver also will ask to provide a urine sample. The urine sample will be tested for bacteria and white blood cells. White blood cells are made by your body to help fight infection.  TREATMENT   Typically, UTIs can be treated with medication. Because most UTIs are caused by a bacterial infection, they usually can be treated with the use of antibiotics. The choice of antibiotic and length of treatment depend on your symptoms and the type of bacteria causing your infection.  HOME CARE INSTRUCTIONS   If you were prescribed antibiotics, take them exactly as your caregiver instructs you. Finish the medication even if you feel better after you  have only taken some of the medication.   Drink enough water and fluids to keep your urine clear or pale yellow.   Avoid caffeine, tea, and carbonated beverages. They tend to irritate your bladder.   Empty your bladder often. Avoid holding urine for long periods of time.   Empty your bladder before and after sexual intercourse.   After a bowel movement, women should cleanse from front to back. Use each tissue only once.  SEEK MEDICAL CARE IF:    You have back pain.   You develop a fever.   Your symptoms do not begin to resolve within 3 days.  SEEK IMMEDIATE MEDICAL CARE IF:    You have severe back pain or lower abdominal pain.   You develop chills.   You have nausea or vomiting.   You have continued burning or discomfort with urination.  MAKE SURE YOU:    Understand these instructions.   Will watch your condition.   Will get help right away if you are not doing well or get worse.  Document Released: 04/02/2005 Document Revised: 12/23/2011 Document Reviewed: 08/01/2011  Exit[employer] Patient Information 2014 [employer].

## 2013-05-16 NOTE — Progress Notes (Signed)
Subjective:    Patient ID: Abigail Moss, female    DOB: [birth year], 67 y.o.   MRN: [id number]  Urinary Tract Infection  This is a new problem. The current episode started in the past 7 days. The problem has been gradually worsening. The patient is experiencing no pain. She is not sexually active. There is no history of pyelonephritis. Associated symptoms include frequency and urgency. Pertinent negatives include no chills, flank pain or hematuria. She has tried increased fluids for the symptoms. There is no history of catheterization, a single kidney or a urological procedure.  Sinusitis This is a new problem. The current episode started today. The problem has been gradually worsening since onset. Associated symptoms include sinus pressure. Pertinent negatives include no chills, headaches, shortness of breath or sore throat. Past treatments include nothing.      Review of Systems  Constitutional: Negative for chills.  HENT: Positive for sinus pressure. Negative for sore throat.   Respiratory: Negative for chest tightness and shortness of breath.   Cardiovascular: Negative for chest pain and palpitations.  Gastrointestinal: Negative for abdominal pain.  Genitourinary: Positive for dysuria, urgency, frequency and enuresis. Negative for hematuria, flank pain and difficulty urinating.  Neurological: Negative for dizziness, weakness and headaches.       Objective:   Physical Exam  Constitutional: She is oriented to person, place, and time. She appears well-developed and well-nourished.  HENT:  Head: Normocephalic and atraumatic.  Mouth/Throat: Posterior oropharyngeal erythema present.  Cardiovascular: Normal rate, regular rhythm and normal heart sounds.   Pulmonary/Chest: Effort normal and breath sounds normal.  Abdominal: Soft. Bowel sounds are normal. She exhibits no distension. There is tenderness in the suprapubic area. There is no CVA tenderness.  Neurological: She is alert and  oriented to person, place, and time.  Skin: Skin is warm and dry.  Psychiatric: She has a normal mood and affect.      Results for orders placed in visit on 05/16/13  POCT UA - MICROSCOPIC ONLY      Result Value Range   WBC, Ur, HPF, POC 10-15     RBC, urine, microscopic neg     Bacteria, U Microscopic mod     Mucus, UA neg     Epithelial cells, urine per micros occ     Crystals, Ur, HPF, POC neg     Casts, Ur, LPF, POC neg     Yeast, UA neg    POCT URINALYSIS DIPSTICK      Result Value Range   Color, UA yellow     Clarity, UA clear     Glucose, UA neg     Bilirubin, UA neg     Ketones, UA neg     Spec Grav, UA <=1.005     Blood, UA neg     pH, UA 5.0     Protein, UA neg     Urobilinogen, UA negative     Nitrite, UA neg     Leukocytes, UA Trace         Assessment & Plan:  1. Urinary tract infection, site not specified  - POCT UA - Microscopic Only - POCT urinalysis dipstick - azithromycin (ZITHROMAX) 250 MG tablet; Take as directed  Dispense: 6 tablet; Refill: 0  2. Sinusitis  - azithromycin (ZITHROMAX) 250 MG tablet; Take as directed  Dispense: 6 tablet; Refill: 0  3. Dysuria  - phenazopyridine (PYRIDIUM) 200 MG tablet; Take 1 tablet (200 mg total) by mouth 3 (three) times daily as  needed for pain.  Dispense: 6 tablet; Refill: 0 -Increase fluid intake Frequent voiding Proper perineal hygiene RTO prn Patient verbalized understanding Coralie Keens, FNP-C

## 2013-05-18 ENCOUNTER — Other Ambulatory Visit: Payer: Self-pay | Admitting: General Practice

## 2013-05-18 ENCOUNTER — Telehealth: Payer: Self-pay | Admitting: General Practice

## 2013-05-18 DIAGNOSIS — N390 Urinary tract infection, site not specified: Secondary | ICD-10-CM

## 2013-05-18 MED ORDER — CIPROFLOXACIN HCL 500 MG PO TABS: 500.0000 mg | ORAL_TABLET | Freq: Two times a day (BID) | ORAL | Status: DC

## 2013-05-18 NOTE — Telephone Encounter (Signed)
Reviewed

## 2013-05-18 NOTE — Telephone Encounter (Signed)
Pt requesting cipro for UTI.

## 2013-05-27 ENCOUNTER — Ambulatory Visit: Payer: Medicare Other | Admitting: General Practice

## 2013-05-27 DIAGNOSIS — Z01419 Encounter for gynecological examination (general) (routine) without abnormal findings: Secondary | ICD-10-CM

## 2013-05-27 NOTE — Progress Notes (Addendum)
  Subjective:    Patient ID: Abigail Moss, female    DOB: Oct 18, 1945, 67 y.o.   MRN: [id number]  HPI Patient presents today for pap recollection. She denies any problems.     Review of Systems  Respiratory: Negative for chest tightness.   Cardiovascular: Negative for chest pain and palpitations.  Gastrointestinal: Negative for abdominal pain.  Genitourinary: Negative for difficulty urinating.       Objective:   Physical Exam  Constitutional: She is oriented to person, place, and time.  Neck: No thyromegaly present.  Cardiovascular: Normal rate, regular rhythm and normal heart sounds.   Pulmonary/Chest: Effort normal and breath sounds normal. No respiratory distress.  Abdominal: Soft. Bowel sounds are normal. She exhibits no distension. There is no tenderness.  Genitourinary: Vagina normal and uterus normal. There is no rash, tenderness, lesion or injury on the right labia. There is no rash, tenderness, lesion or injury on the left labia. Uterus is not deviated, not enlarged, not fixed and not tender. Cervix exhibits no motion tenderness, no discharge and no friability. Right adnexum displays no mass, no tenderness and no fullness. Left adnexum displays no mass, no tenderness and no fullness. No erythema, tenderness or bleeding around the vagina. No foreign body around the vagina. No signs of injury around the vagina. No vaginal discharge found.  Lymphadenopathy:    She has no cervical adenopathy.  Neurological: She is alert and oriented to person, place, and time.  Skin: Skin is warm and dry.  Psychiatric: She has a normal mood and affect.          Assessment & Plan:  1. Visit for pelvic exam - Pap IG w/ reflex to HPV when ASC-U -recollection of specimen Patient verbalized understanding Coralie Keens, FNP-C

## 2013-05-27 NOTE — Addendum Note (Signed)
Addended by: Lisbeth Ply C on: 05/27/2013 11:34 AM   Modules accepted: Orders

## 2013-06-02 LAB — PAP IG W/ RFLX HPV ASCU: PAP Smear Comment: 0

## 2013-07-12 ENCOUNTER — Encounter: Payer: Self-pay | Admitting: *Deleted

## 2013-07-18 ENCOUNTER — Encounter: Payer: Medicare Other | Admitting: General Practice

## 2013-07-18 ENCOUNTER — Other Ambulatory Visit: Payer: Self-pay | Admitting: General Practice

## 2013-07-18 DIAGNOSIS — R350 Frequency of micturition: Secondary | ICD-10-CM

## 2013-07-19 ENCOUNTER — Other Ambulatory Visit: Payer: Self-pay | Admitting: General Practice

## 2013-07-19 ENCOUNTER — Other Ambulatory Visit (INDEPENDENT_AMBULATORY_CARE_PROVIDER_SITE_OTHER): Payer: Medicare Other

## 2013-07-19 DIAGNOSIS — N390 Urinary tract infection, site not specified: Secondary | ICD-10-CM

## 2013-07-19 LAB — POCT UA - MICROSCOPIC ONLY
Bacteria, U Microscopic: NEGATIVE
CRYSTALS, UR, HPF, POC: NEGATIVE
Casts, Ur, LPF, POC: NEGATIVE
Mucus, UA: NEGATIVE
RBC, URINE, MICROSCOPIC: NEGATIVE
Yeast, UA: NEGATIVE

## 2013-07-19 LAB — POCT URINALYSIS DIPSTICK
BILIRUBIN UA: NEGATIVE
Blood, UA: NEGATIVE
Glucose, UA: NEGATIVE
KETONES UA: NEGATIVE
Nitrite, UA: NEGATIVE
PH UA: 6.5
Protein, UA: NEGATIVE
Spec Grav, UA: 1.01
Urobilinogen, UA: NEGATIVE

## 2013-07-19 MED ORDER — CIPROFLOXACIN HCL 500 MG PO TABS: 500.0000 mg | ORAL_TABLET | Freq: Two times a day (BID) | ORAL | Status: DC

## 2013-07-19 NOTE — Progress Notes (Signed)
Pt dropped off urine only 

## 2013-07-26 NOTE — Progress Notes (Signed)
Patient ID: Abigail SprangRosa S Eanes, female   DOB: [birth year], 68 y.o.   MRN: [id number]  Patient not seen today

## 2013-10-06 ENCOUNTER — Ambulatory Visit: Payer: Medicare Other | Admitting: General Practice

## 2013-10-07 ENCOUNTER — Ambulatory Visit: Payer: Medicare Other | Admitting: General Practice

## 2013-10-10 ENCOUNTER — Ambulatory Visit: Payer: Medicare Other | Admitting: General Practice

## 2013-10-11 ENCOUNTER — Ambulatory Visit (INDEPENDENT_AMBULATORY_CARE_PROVIDER_SITE_OTHER): Payer: Medicare Other | Admitting: General Practice

## 2013-10-11 DIAGNOSIS — I10 Essential (primary) hypertension: Secondary | ICD-10-CM

## 2013-10-11 DIAGNOSIS — E039 Hypothyroidism, unspecified: Secondary | ICD-10-CM

## 2013-10-11 DIAGNOSIS — J0100 Acute maxillary sinusitis, unspecified: Secondary | ICD-10-CM

## 2013-10-11 DIAGNOSIS — J309 Allergic rhinitis, unspecified: Secondary | ICD-10-CM

## 2013-10-11 DIAGNOSIS — J302 Other seasonal allergic rhinitis: Secondary | ICD-10-CM

## 2013-10-11 DIAGNOSIS — E785 Hyperlipidemia, unspecified: Secondary | ICD-10-CM

## 2013-10-11 MED ORDER — AZITHROMYCIN 250 MG PO TABS: ORAL_TABLET | ORAL | Status: DC

## 2013-10-11 MED ORDER — OFLOXACIN 0.3 % OT SOLN: 5.0000 [drp] | Freq: Every day | OTIC | Status: DC

## 2013-10-11 MED ORDER — FLUTICASONE PROPIONATE 50 MCG/ACT NA SUSP: 1.0000 | Freq: Every day | NASAL | Status: DC

## 2013-10-11 NOTE — Patient Instructions (Signed)
Allergic Rhinitis Allergic rhinitis is when the mucous membranes in the nose respond to allergens. Allergens are particles in the air that cause your body to have an allergic reaction. This causes you to release allergic antibodies. Through a chain of events, these eventually cause you to release histamine into the blood stream. Although meant to protect the body, it is this release of histamine that causes your discomfort, such as frequent sneezing, congestion, and an itchy, runny nose.  CAUSES  Seasonal allergic rhinitis (hay fever) is caused by pollen allergens that may come from grasses, trees, and weeds. Year-round allergic rhinitis (perennial allergic rhinitis) is caused by allergens such as house dust mites, pet dander, and mold spores.  SYMPTOMS   Nasal stuffiness (congestion).  Itchy, runny nose with sneezing and tearing of the eyes. DIAGNOSIS  Your health care provider can help you determine the allergen or allergens that trigger your symptoms. If you and your health care provider are unable to determine the allergen, skin or blood testing may be used. TREATMENT  Allergic Rhinitis does not have a cure, but it can be controlled by:  Medicines and allergy shots (immunotherapy).  Avoiding the allergen. Hay fever may often be treated with antihistamines in pill or nasal spray forms. Antihistamines block the effects of histamine. There are over-the-counter medicines that may help with nasal congestion and swelling around the eyes. Check with your health care provider before taking or giving this medicine.  If avoiding the allergen or the medicine prescribed do not work, there are many new medicines your health care provider can prescribe. Stronger medicine may be used if initial measures are ineffective. Desensitizing injections can be used if medicine and avoidance does not work. Desensitization is when a patient is given ongoing shots until the body becomes less sensitive to the allergen.  Make sure you follow up with your health care provider if problems continue. HOME CARE INSTRUCTIONS It is not possible to completely avoid allergens, but you can reduce your symptoms by taking steps to limit your exposure to them. It helps to know exactly what you are allergic to so that you can avoid your specific triggers. SEEK MEDICAL CARE IF:   You have a fever.  You develop a cough that does not stop easily (persistent).  You have shortness of breath.  You start wheezing.  Symptoms interfere with normal daily activities. Document Released: 03/18/2001 Document Revised: 04/13/2013 Document Reviewed: 02/28/2013 [employer] Patient Information 2014 [employer].  

## 2013-10-11 NOTE — Progress Notes (Signed)
   Subjective:    Patient ID: Abigail Moss, female    DOB: February 24, 1946, 68 y.o.   MRN: [id number]   Patient presents today for chronic health follow up. History of anxiety, depression, asthma, allergic rhinitis, hypothyroidism, hypertension, and GERD. Anxiety and mood is well controlled with medications. Checking blood pressures at home and range 120's/70's. Taking medications as prescribed. Reports eating a regular diet and walks for exercise daily.   Sinusitis This is a new problem. The current episode started in the past 7 days. The problem has been gradually worsening since onset. There has been no fever. Associated symptoms include congestion, sinus pressure and sneezing. Pertinent negatives include no chills, headaches or shortness of breath. Past treatments include oral decongestants. The treatment provided no relief.      Review of Systems  Constitutional: Negative for fever and chills.  HENT: Positive for congestion, sinus pressure and sneezing.   Respiratory: Negative for chest tightness and shortness of breath.   Cardiovascular: Negative for chest pain and palpitations.  Neurological: Negative for dizziness, weakness and headaches.       Objective:   Physical Exam  Constitutional: She appears well-developed and well-nourished.  HENT:  Head: Normocephalic and atraumatic.  Nose: Right sinus exhibits maxillary sinus tenderness and frontal sinus tenderness. Left sinus exhibits maxillary sinus tenderness and frontal sinus tenderness.  Mouth/Throat: Posterior oropharyngeal erythema present.  Cardiovascular: Normal rate, regular rhythm and normal heart sounds.   No murmur heard. Pulmonary/Chest: Effort normal and breath sounds normal. No respiratory distress. She exhibits no tenderness.  Neurological: She is alert.  Skin: Skin is warm and dry. No rash noted.  Psychiatric: She has a normal mood and affect.          Assessment & Plan:  1. Sinusitis, acute maxillary  -  azithromycin (ZITHROMAX) 250 MG tablet; Take as directed  Dispense: 6 tablet; Refill: 0  2. Seasonal allergic rhinitis  - fluticasone (FLONASE) 50 MCG/ACT nasal spray; Place 1 spray into both nostrils daily.  Dispense: 16 g; Refill: 3  3. Hypothyroidism  - Thyroid Panel With TSH  4. Hyperlipidemia  - Lipid panel  5. Hypertension  - CMP14+EGFR -Continue all current medications Labs pending F/u in 3 months Discussed benefits of regular exercise and healthy eating Patient verbalized understanding Erby Pian, FNP-C

## 2013-10-12 ENCOUNTER — Encounter: Payer: Self-pay | Admitting: General Practice

## 2013-10-12 LAB — CMP14+EGFR
ALT: 20 IU/L (ref 0–32)
AST: 17 IU/L (ref 0–40)
Albumin/Globulin Ratio: 1.3 (ref 1.1–2.5)
Albumin: 3.9 g/dL (ref 3.6–4.8)
Alkaline Phosphatase: 69 IU/L (ref 39–117)
BILIRUBIN TOTAL: 0.3 mg/dL (ref 0.0–1.2)
BUN/Creatinine Ratio: 18 (ref 11–26)
BUN: 15 mg/dL (ref 8–27)
CO2: 25 mmol/L (ref 18–29)
CREATININE: 0.85 mg/dL (ref 0.57–1.00)
Calcium: 9.7 mg/dL (ref 8.7–10.3)
Chloride: 103 mmol/L (ref 97–108)
GFR, EST AFRICAN AMERICAN: 81 mL/min/{1.73_m2} (ref >59)
GFR, EST NON AFRICAN AMERICAN: 70 mL/min/{1.73_m2} (ref >59)
GLUCOSE: 85 mg/dL (ref 65–99)
Globulin, Total: 2.9 g/dL (ref 1.5–4.5)
POTASSIUM: 4.2 mmol/L (ref 3.5–5.2)
Sodium: 142 mmol/L (ref 134–144)
Total Protein: 6.8 g/dL (ref 6.0–8.5)

## 2013-10-12 LAB — LIPID PANEL
CHOL/HDL RATIO: 3 ratio (ref 0.0–4.4)
Cholesterol, Total: 170 mg/dL (ref 100–199)
HDL: 57 mg/dL (ref >39)
LDL CALC: 100 mg/dL — AB (ref 0–99)
Triglycerides: 65 mg/dL (ref 0–149)
VLDL Cholesterol Cal: 13 mg/dL (ref 5–40)

## 2013-10-12 LAB — THYROID PANEL WITH TSH
Free Thyroxine Index: 3.1 (ref 1.2–4.9)
T3 Uptake Ratio: 29 % (ref 24–39)
T4 TOTAL: 10.7 ug/dL (ref 4.5–12.0)
TSH: 1.34 u[IU]/mL (ref 0.450–4.500)

## 2013-10-13 ENCOUNTER — Telehealth: Payer: Self-pay | Admitting: Nurse Practitioner

## 2013-10-13 NOTE — Telephone Encounter (Signed)
Appt scheduled for tomorrow. Pt aware.

## 2013-10-14 ENCOUNTER — Encounter: Payer: Self-pay | Admitting: Nurse Practitioner

## 2013-10-14 ENCOUNTER — Other Ambulatory Visit: Payer: Self-pay | Admitting: General Practice

## 2013-10-14 ENCOUNTER — Ambulatory Visit (INDEPENDENT_AMBULATORY_CARE_PROVIDER_SITE_OTHER): Payer: Medicare Other | Admitting: Nurse Practitioner

## 2013-10-14 VITALS — BP 142/89 | HR 76 | Temp 97.5°F | Ht 66.5 in | Wt 186.0 lb

## 2013-10-14 DIAGNOSIS — I10 Essential (primary) hypertension: Secondary | ICD-10-CM

## 2013-10-14 DIAGNOSIS — N390 Urinary tract infection, site not specified: Secondary | ICD-10-CM

## 2013-10-14 DIAGNOSIS — IMO0001 Reserved for inherently not codable concepts without codable children: Secondary | ICD-10-CM

## 2013-10-14 DIAGNOSIS — R350 Frequency of micturition: Secondary | ICD-10-CM

## 2013-10-14 LAB — POCT UA - MICROSCOPIC ONLY
Casts, Ur, LPF, POC: NEGATIVE
Crystals, Ur, HPF, POC: NEGATIVE
MUCUS UA: NEGATIVE
Yeast, UA: NEGATIVE

## 2013-10-14 LAB — POCT URINALYSIS DIPSTICK
Bilirubin, UA: NEGATIVE
Glucose, UA: NEGATIVE
KETONES UA: NEGATIVE
Nitrite, UA: POSITIVE
Protein, UA: NEGATIVE
Urobilinogen, UA: NEGATIVE
pH, UA: 5

## 2013-10-14 MED ORDER — LOSARTAN POTASSIUM 50 MG PO TABS: 50.0000 mg | ORAL_TABLET | Freq: Every day | ORAL | Status: DC

## 2013-10-14 MED ORDER — CIPROFLOXACIN HCL 500 MG PO TABS: 500.0000 mg | ORAL_TABLET | Freq: Two times a day (BID) | ORAL | Status: DC

## 2013-10-14 NOTE — Progress Notes (Signed)
   Subjective:    Patient ID: Abigail Moss, female    DOB: [birth year], 68 y.o.   MRN: [id number]  HPI Patient in c/o dysuria and frequency was up every hour last night going to the bathroom with scant amounts.    Review of Systems  Respiratory: Negative.   Cardiovascular: Negative.   Genitourinary: Positive for dysuria, urgency and frequency.  Psychiatric/Behavioral: Negative.   All other systems reviewed and are negative.      Objective:   Physical Exam  Constitutional: She appears well-developed and well-nourished.  Cardiovascular: Normal rate, regular rhythm and normal heart sounds.   Pulmonary/Chest: Effort normal and breath sounds normal.  Abdominal: Soft. There is no tenderness.  Genitourinary:  No CVA tenderness  Skin: Skin is warm.    BP 142/89  Pulse 76  Temp(Src) 97.5 F (36.4 C) (Oral)  Ht 5' 6.5" (1.689 m)  Wt 186 lb (84.369 kg)  BMI 29.57 kg/m2       Assessment & Plan:   1. Frequency   2. UTI (urinary tract infection)    Meds ordered this encounter  Medications  . ciprofloxacin (CIPRO) 500 MG tablet    Sig: Take 1 tablet (500 mg total) by mouth 2 (two) times daily.    Dispense:  14 tablet    Refill:  0    Order Specific Question:  Supervising Provider    Answer:  Ernestina PennaMOORE, DONALD W [[id number]]   Orders Placed This Encounter  Procedures  . Urine culture  . POCT urinalysis dipstick  . POCT UA - Microscopic Only   Force fluids AZO over the counter X2 days RTO prn Culture pending  Mary-Margaret Daphine DeutscherMartin, FNP

## 2013-10-14 NOTE — Patient Instructions (Signed)
Urinary Tract Infection  Urinary tract infections (UTIs) can develop anywhere along your urinary tract. Your urinary tract is your body's drainage system for removing wastes and extra water. Your urinary tract includes two kidneys, two ureters, a bladder, and a urethra. Your kidneys are a pair of bean-shaped organs. Each kidney is about the size of your fist. They are located below your ribs, one on each side of your spine.  CAUSES  Infections are caused by microbes, which are microscopic organisms, including fungi, viruses, and bacteria. These organisms are so small that they can only be seen through a microscope. Bacteria are the microbes that most commonly cause UTIs.  SYMPTOMS   Symptoms of UTIs may vary by age and gender of the patient and by the location of the infection. Symptoms in young women typically include a frequent and intense urge to urinate and a painful, burning feeling in the bladder or urethra during urination. Older women and men are more likely to be tired, shaky, and weak and have muscle aches and abdominal pain. A fever may mean the infection is in your kidneys. Other symptoms of a kidney infection include pain in your back or sides below the ribs, nausea, and vomiting.  DIAGNOSIS  To diagnose a UTI, your caregiver will ask you about your symptoms. Your caregiver also will ask to provide a urine sample. The urine sample will be tested for bacteria and white blood cells. White blood cells are made by your body to help fight infection.  TREATMENT   Typically, UTIs can be treated with medication. Because most UTIs are caused by a bacterial infection, they usually can be treated with the use of antibiotics. The choice of antibiotic and length of treatment depend on your symptoms and the type of bacteria causing your infection.  HOME CARE INSTRUCTIONS   If you were prescribed antibiotics, take them exactly as your caregiver instructs you. Finish the medication even if you feel better after you  have only taken some of the medication.   Drink enough water and fluids to keep your urine clear or pale yellow.   Avoid caffeine, tea, and carbonated beverages. They tend to irritate your bladder.   Empty your bladder often. Avoid holding urine for long periods of time.   Empty your bladder before and after sexual intercourse.   After a bowel movement, women should cleanse from front to back. Use each tissue only once.  SEEK MEDICAL CARE IF:    You have back pain.   You develop a fever.   Your symptoms do not begin to resolve within 3 days.  SEEK IMMEDIATE MEDICAL CARE IF:    You have severe back pain or lower abdominal pain.   You develop chills.   You have nausea or vomiting.   You have continued burning or discomfort with urination.  MAKE SURE YOU:    Understand these instructions.   Will watch your condition.   Will get help right away if you are not doing well or get worse.  Document Released: 04/02/2005 Document Revised: 12/23/2011 Document Reviewed: 08/01/2011  Exit[employer] Patient Information 2014 [employer].

## 2013-10-16 LAB — URINE CULTURE

## 2013-10-17 ENCOUNTER — Other Ambulatory Visit: Payer: Self-pay | Admitting: Nurse Practitioner

## 2013-10-17 MED ORDER — NITROFURANTOIN MONOHYD MACRO 100 MG PO CAPS: 100.0000 mg | ORAL_CAPSULE | Freq: Two times a day (BID) | ORAL | Status: DC

## 2013-11-04 ENCOUNTER — Other Ambulatory Visit: Payer: Self-pay | Admitting: General Practice

## 2013-11-04 DIAGNOSIS — E039 Hypothyroidism, unspecified: Secondary | ICD-10-CM

## 2013-11-04 MED ORDER — LEVOTHYROXINE SODIUM 125 MCG PO TABS: 137.0000 ug | ORAL_TABLET | Freq: Every day | ORAL | Status: DC

## 2013-11-24 ENCOUNTER — Telehealth: Payer: Self-pay | Admitting: Nurse Practitioner

## 2013-11-24 NOTE — Telephone Encounter (Signed)
Dose was decreased from Levothyroxine 137mcg to 125mcg because TSH was decreased in Oct 2014. It was normal in April 2015. She should continue current dose of 125mcg. Explained to patient and she stated understanding.

## 2013-11-30 ENCOUNTER — Telehealth: Payer: Self-pay | Admitting: Nurse Practitioner

## 2013-11-30 NOTE — Telephone Encounter (Signed)
appt given for Abigail Moss tomorrow at 4:15

## 2013-12-01 ENCOUNTER — Other Ambulatory Visit: Payer: Self-pay | Admitting: General Practice

## 2013-12-01 ENCOUNTER — Ambulatory Visit: Payer: Medicare Other | Admitting: Family Medicine

## 2013-12-09 ENCOUNTER — Other Ambulatory Visit: Payer: Self-pay | Admitting: General Practice

## 2014-03-07 ENCOUNTER — Other Ambulatory Visit: Payer: Self-pay | Admitting: General Practice

## 2014-03-07 ENCOUNTER — Other Ambulatory Visit: Payer: Self-pay | Admitting: Nurse Practitioner

## 2014-03-08 ENCOUNTER — Other Ambulatory Visit: Payer: Self-pay | Admitting: *Deleted

## 2014-03-08 NOTE — Telephone Encounter (Signed)
Last ov 4/15. 

## 2014-03-09 MED ORDER — BUPROPION HCL ER (XL) 300 MG PO TB24: ORAL_TABLET | ORAL | Status: DC

## 2014-03-20 ENCOUNTER — Encounter: Payer: Self-pay | Admitting: Internal Medicine

## 2014-04-03 ENCOUNTER — Encounter: Payer: Self-pay | Admitting: Family Medicine

## 2014-04-03 ENCOUNTER — Ambulatory Visit (INDEPENDENT_AMBULATORY_CARE_PROVIDER_SITE_OTHER): Payer: Medicare Other | Admitting: Family Medicine

## 2014-04-03 VITALS — BP 124/80 | HR 84 | Temp 98.6°F | Ht 66.5 in | Wt 184.8 lb

## 2014-04-03 DIAGNOSIS — H6500 Acute serous otitis media, unspecified ear: Secondary | ICD-10-CM

## 2014-04-03 DIAGNOSIS — H6504 Acute serous otitis media, recurrent, right ear: Secondary | ICD-10-CM

## 2014-04-03 DIAGNOSIS — J302 Other seasonal allergic rhinitis: Secondary | ICD-10-CM

## 2014-04-03 DIAGNOSIS — J209 Acute bronchitis, unspecified: Secondary | ICD-10-CM

## 2014-04-03 DIAGNOSIS — H60399 Other infective otitis externa, unspecified ear: Secondary | ICD-10-CM

## 2014-04-03 DIAGNOSIS — J208 Acute bronchitis due to other specified organisms: Secondary | ICD-10-CM

## 2014-04-03 DIAGNOSIS — J309 Allergic rhinitis, unspecified: Secondary | ICD-10-CM

## 2014-04-03 DIAGNOSIS — H60391 Other infective otitis externa, right ear: Secondary | ICD-10-CM

## 2014-04-03 MED ORDER — HYDROCORTISONE-ACETIC ACID 1-2 % OT SOLN: 4.0000 [drp] | Freq: Four times a day (QID) | OTIC | Status: DC

## 2014-04-03 MED ORDER — AMOXICILLIN 875 MG PO TABS: 875.0000 mg | ORAL_TABLET | Freq: Two times a day (BID) | ORAL | Status: DC

## 2014-04-03 MED ORDER — FLUTICASONE PROPIONATE 50 MCG/ACT NA SUSP: 1.0000 | Freq: Every day | NASAL | Status: DC

## 2014-04-03 MED ORDER — METHYLPREDNISOLONE (PAK) 4 MG PO TABS: ORAL_TABLET | ORAL | Status: DC

## 2014-04-03 NOTE — Progress Notes (Signed)
   Subjective:    Patient ID: Abigail Moss, female    DOB: 29-Nov-1945, 68 y.o.   MRN: [id number]  HPI This 68 y.o. female presents for evaluation of right ear discomfort and persistent cough and wheezing at HS.   Review of Systems No chest pain, SOB, HA, dizziness, vision change, N/V, diarrhea, constipation, dysuria, urinary urgency or frequency, myalgias, arthralgias or rash.     Objective:   Physical Exam  Vital signs noted  Well developed well nourished female.  HEENT - Head atraumatic Normocephalic                Eyes - PERRLA, Conjuctiva - clear                 Ears - EAC's Wnl right tm injected and left tm normal with PE tube                Nose - Nares patent                 Throat - oropharanx wnl Respiratory - Lungs CTA bilateral Cardiac - RRR S1 and S2 without murmur GI - Abdomen soft Nontender and bowel sounds active x 4 Extremities - No edema. Neuro - Grossly intact.      Assessment & Plan:  Seasonal allergic rhinitis - Plan: fluticasone (FLONASE) 50 MCG/ACT nasal spray  Recurrent acute serous otitis media of right ear - Plan: acetic acid-hydrocortisone (VOSOL-HC) otic solution, amoxicillin (AMOXIL) 875 MG tablet  Otitis, externa, infective, right - Plan: acetic acid-hydrocortisone (VOSOL-HC) otic solution, amoxicillin (AMOXIL) 875 MG tablet  Acute bronchitis due to other specified organisms - Plan: methylPREDNIsolone (MEDROL DOSPACK) 4 MG tablet  Push po fluids, rest, tylenol and motrin otc prn as directed for fever, arthralgias, and myalgias.  Follow up prn if sx's continue or persist.  Deatra Canter FNP

## 2014-04-04 ENCOUNTER — Telehealth: Payer: Self-pay | Admitting: Family Medicine

## 2014-04-05 ENCOUNTER — Other Ambulatory Visit: Payer: Self-pay | Admitting: Family Medicine

## 2014-04-05 MED ORDER — NEOMYCIN-POLYMYXIN-HC 3.5-10000-1 OT SOLN: 3.0000 [drp] | Freq: Four times a day (QID) | OTIC | Status: DC

## 2014-04-05 NOTE — Telephone Encounter (Signed)
New ear gtt med sent to pharmacy

## 2014-04-06 NOTE — Telephone Encounter (Signed)
Patient aware.

## 2014-04-17 ENCOUNTER — Other Ambulatory Visit: Payer: Self-pay | Admitting: *Deleted

## 2014-04-17 ENCOUNTER — Telehealth: Payer: Self-pay | Admitting: Family Medicine

## 2014-04-17 ENCOUNTER — Other Ambulatory Visit: Payer: Self-pay | Admitting: Family Medicine

## 2014-04-17 MED ORDER — LOSARTAN POTASSIUM 50 MG PO TABS: 50.0000 mg | ORAL_TABLET | Freq: Every day | ORAL | Status: DC

## 2014-05-01 NOTE — Telephone Encounter (Signed)
RX refilled 10-12

## 2014-05-18 ENCOUNTER — Encounter: Payer: Self-pay | Admitting: Family Medicine

## 2014-05-18 ENCOUNTER — Ambulatory Visit (INDEPENDENT_AMBULATORY_CARE_PROVIDER_SITE_OTHER): Payer: Medicare Other | Admitting: Family Medicine

## 2014-05-18 VITALS — BP 127/83 | HR 70 | Temp 97.5°F | Ht 66.5 in | Wt 185.4 lb

## 2014-05-18 DIAGNOSIS — J208 Acute bronchitis due to other specified organisms: Secondary | ICD-10-CM

## 2014-05-18 DIAGNOSIS — E038 Other specified hypothyroidism: Secondary | ICD-10-CM

## 2014-05-18 MED ORDER — MONTELUKAST SODIUM 10 MG PO TABS: ORAL_TABLET | ORAL | Status: DC

## 2014-05-18 MED ORDER — OMEPRAZOLE 40 MG PO CPDR
DELAYED_RELEASE_CAPSULE | ORAL | Status: DC
Start: 2014-05-18 — End: 2014-11-21

## 2014-05-18 MED ORDER — METHYLPREDNISOLONE (PAK) 4 MG PO TABS: ORAL_TABLET | ORAL | Status: DC

## 2014-05-18 MED ORDER — AMOXICILLIN 875 MG PO TABS: 875.0000 mg | ORAL_TABLET | Freq: Two times a day (BID) | ORAL | Status: DC

## 2014-05-18 MED ORDER — BUPROPION HCL ER (XL) 300 MG PO TB24: ORAL_TABLET | ORAL | Status: DC

## 2014-05-18 MED ORDER — LOSARTAN POTASSIUM 50 MG PO TABS: 50.0000 mg | ORAL_TABLET | Freq: Every day | ORAL | Status: DC

## 2014-05-18 MED ORDER — LEVOTHYROXINE SODIUM 125 MCG PO TABS: 137.0000 ug | ORAL_TABLET | Freq: Every day | ORAL | Status: DC

## 2014-05-18 NOTE — Progress Notes (Signed)
   Subjective:    Patient ID: Abigail SprangRosa S Fortin, female    DOB: [birth year], 68 y.o.   MRN: [id number]  HPI Patient c/o persistent cough and sinus pressure.  She is feeling weak and she has some halotosis.  Review of Systems  Constitutional: Negative for fever.  HENT: Negative for ear pain.   Eyes: Negative for discharge.  Respiratory: Negative for cough.   Cardiovascular: Negative for chest pain.  Gastrointestinal: Negative for abdominal distention.  Endocrine: Negative for polyuria.  Genitourinary: Negative for difficulty urinating.  Musculoskeletal: Negative for gait problem and neck pain.  Skin: Negative for color change and rash.  Neurological: Negative for speech difficulty and headaches.  Psychiatric/Behavioral: Negative for agitation.       Objective:    BP 127/83 mmHg  Pulse 70  Temp(Src) 97.5 F (36.4 C) (Oral)  Ht 5' 6.5" (1.689 m)  Wt 185 lb 6.4 oz (84.097 kg)  BMI 29.48 kg/m2 Physical Exam  Constitutional: She is oriented to person, place, and time. She appears well-developed and well-nourished.  HENT:  Head: Normocephalic and atraumatic.  Mouth/Throat: Oropharynx is clear and moist.  Eyes: Pupils are equal, round, and reactive to light.  Neck: Normal range of motion. Neck supple.  Cardiovascular: Normal rate and regular rhythm.   No murmur heard. Pulmonary/Chest: Effort normal and breath sounds normal.  Abdominal: Soft. Bowel sounds are normal. There is no tenderness.  Neurological: She is alert and oriented to person, place, and time.  Skin: Skin is warm and dry.  Psychiatric: She has a normal mood and affect.          Assessment & Plan:     ICD-9-CM ICD-10-CM   1. Other specified hypothyroidism 244.8 E03.8 levothyroxine (SYNTHROID, LEVOTHROID) 125 MCG tablet  2. Acute bronchitis due to other specified organisms 466.0 J20.8 amoxicillin (AMOXIL) 875 MG tablet     methylPREDNIsolone (MEDROL DOSPACK) 4 MG tablet     Return if symptoms worsen or fail  to improve.  Deatra CanterWilliam J Oxford FNP

## 2014-06-21 ENCOUNTER — Other Ambulatory Visit: Payer: Self-pay | Admitting: Family Medicine

## 2014-06-21 NOTE — Telephone Encounter (Signed)
Pt notifed RX was sent into [facility]

## 2014-06-21 NOTE — Telephone Encounter (Signed)
Last seen 05/18/14  B [place]

## 2014-06-21 NOTE — Telephone Encounter (Signed)
Please review and advise.

## 2014-06-22 ENCOUNTER — Telehealth: Payer: Self-pay | Admitting: Nurse Practitioner

## 2014-06-22 ENCOUNTER — Ambulatory Visit: Payer: Medicare Other | Admitting: Family Medicine

## 2014-06-22 NOTE — Telephone Encounter (Signed)
Patient coming for evening appt today.

## 2014-06-23 ENCOUNTER — Ambulatory Visit (INDEPENDENT_AMBULATORY_CARE_PROVIDER_SITE_OTHER): Payer: Medicare Other | Admitting: Family Medicine

## 2014-06-23 ENCOUNTER — Encounter: Payer: Self-pay | Admitting: Family Medicine

## 2014-06-23 VITALS — BP 144/84 | HR 78 | Temp 97.4°F | Ht 66.5 in | Wt 184.2 lb

## 2014-06-23 DIAGNOSIS — J0101 Acute recurrent maxillary sinusitis: Secondary | ICD-10-CM

## 2014-06-23 MED ORDER — PREDNISONE 10 MG PO TABS: ORAL_TABLET | ORAL | Status: DC

## 2014-06-23 MED ORDER — AZITHROMYCIN 250 MG PO TABS: ORAL_TABLET | ORAL | Status: DC

## 2014-06-23 NOTE — Progress Notes (Signed)
Subjective:    Patient ID: Abigail SprangRosa S Horst, female    DOB: 06/29/1946, 68 y.o.   MRN: [id number]  HPI  68-.year-old female with complaints of sore throat, hoarseness, cough, sinus congestion. She has not had fever. She does not smoke. She has a history of sinus and ear problems and is usually given Zithromax    Review of Systems  HENT: Positive for congestion, sinus pressure, sore throat and voice change.   Respiratory: Positive for cough.        Objective:   Physical Exam  Constitutional: She appears well-developed and well-nourished.  HENT:  Mouth/Throat: Oropharynx is clear and moist. No oropharyngeal exudate.  P E tube is in place left one is almost extruded Sinuses are tender in the maxillary areas  Cardiovascular: Normal rate and regular rhythm.   Pulmonary/Chest: Effort normal and breath sounds normal.   BP 144/84 mmHg  Pulse 78  Temp(Src) 97.4 F (36.3 C) (Oral)  Ht 5' 6.5" (1.689 m)  Wt 184 lb 3.2 oz (83.553 kg)  BMI 29.29 kg/m2       Assessment & Plan:  1. Acute recurrent maxillary sinusitis Rx: Zithromax and tapered dose of prednisone. Increased humidification and Mucinex  Frederica KusterStephen M Darby Shadwick MD   Medication Sig Start Date End Date Taking? Authorizing Provider  acetic acid-hydrocortisone (VOSOL-HC) otic solution Place 4 drops into both ears 4 (four) times daily. 04/03/14  Yes Deatra CanterWilliam J Oxford, FNP  ALPRAZolam Prudy Feeler(XANAX) 0.5 MG tablet Take 1 tablet (0.5 mg total) by mouth at bedtime as needed for sleep. 04/07/13  Yes Mae Shelda AltesE Haliburton, FNP  aspirin EC 81 MG tablet Take 81 mg by mouth daily.   Yes Historical Provider, MD  buPROPion (WELLBUTRIN XL) 300 MG 24 hr tablet TAKE 1 TABLET (300 MG TOTAL)  BY MOUTH DAILY. 05/18/14  Yes Deatra CanterWilliam J Oxford, FNP  Cholecalciferol (VITAMIN D-3 PO) Take 800 mg by mouth daily.   Yes Historical Provider, MD  fluticasone (FLONASE) 50 MCG/ACT nasal spray Place 1 spray into both nostrils daily. 04/03/14  Yes Deatra CanterWilliam J Oxford, FNP    levothyroxine (SYNTHROID, LEVOTHROID) 125 MCG tablet Take 1 tablet (125 mcg total) by mouth daily before breakfast. 05/18/14  Yes Deatra CanterWilliam J Oxford, FNP  losartan (COZAAR) 50 MG tablet Take 1 tablet (50 mg total) by mouth daily. 05/18/14  Yes Deatra CanterWilliam J Oxford, FNP  montelukast (SINGULAIR) 10 MG tablet TAKE ONE TABLET BY MOUTH AT BEDTIME 05/18/14  Yes Deatra CanterWilliam J Oxford, FNP  MULTIPLE VITAMIN PO Take by mouth.   Yes Historical Provider, MD  omeprazole (PRILOSEC) 40 MG capsule TAKE 1 CAPSULE (40 MG TOTAL)  BY MOUTH DAILY. 05/18/14  Yes Deatra CanterWilliam J Oxford, FNP  azithromycin (ZITHROMAX) 250 MG tablet 2 tab stat, then 1 qd x 4 days 06/23/14   Frederica KusterStephen M Kit Brubacher, MD  predniSONE (DELTASONE) 10 MG tablet 4 tab, then 3 tab, 2 tab, 1 tab on consecutive days 06/23/14   Frederica KusterStephen M Sulaiman Imbert, MD    Allergies:  Allergies  Allergen Reactions  . Neomycin     REACTION: itching  . Sulfur     REACTION: itching    History   Social History  . Marital Status: Married    Spouse Name: N/A    Number of Children: N/A  . Years of Education: N/A   Occupational History  . Not on file.   Social History Main Topics  . Smoking status: Former Smoker    Quit date: 07/07/1996  . Smokeless tobacco: Never Used  .  Alcohol Use: No  . Drug Use: No  . Sexual Activity: Not on file   Other Topics Concern  . Not on file   Social History Narrative     Review of Systems: Constitutional: negative for chills, fever, night sweats, weight changes, or fatigue  HEENT: negative for vision changes, hearing loss, congestion, rhinorrhea, ST, epistaxis, or sinus pressure Cardiovascular: negative for chest pain or palpitations Respiratory: negative for hemoptysis, wheezing, shortness of breath, or cough Abdominal: negative for abdominal pain, nausea, vomiting, diarrhea, or constipation Dermatological: negative for rash Neurologic: negative for headache, dizziness, or syncope All other systems reviewed and are otherwise negative  with the exception to those above and in the HPI.   Physical Exam: Blood pressure 144/84, pulse 78, temperature 97.4 F (36.3 C), temperature source Oral, height 5' 6.5" (1.689 m), weight 184 lb 3.2 oz (83.553 kg)., Body mass index is 29.29 kg/(m^2). General: Well developed, well nourished, in no acute distress. Head: Normocephalic, atraumatic, eyes without discharge, sclera non-icteric, nares are without discharge. Bilateral auditory canals clear, TM's are without perforation, pearly grey and translucent with reflective cone of light bilaterally. Oral cavity moist, posterior pharynx without exudate, erythema, peritonsillar abscess, or post nasal drip.  Neck: Supple. No thyromegaly. Full ROM. No lymphadenopathy. Lungs: Clear bilaterally to auscultation without wheezes, rales, or rhonchi. Breathing is unlabored. Heart: RRR with S1 S2. No murmurs, rubs, or gallops appreciated. Abdomen: Soft, non-tender, non-distended with normoactive bowel sounds. No hepatomegaly. No rebound/guarding. No obvious abdominal masses. Msk:  Strength and tone normal for age. Extremities/Skin: Warm and dry. No clubbing or cyanosis. No edema. No rashes or suspicious lesions. Neuro: Alert and oriented X 3. Moves all extremities spontaneously. Gait is normal. CNII-XII grossly in tact. Psych:  Responds to questions appropriately with a normal affect.   Labs:   ASSESSMENT AND PLAN:  68 y.o. year old female with    06/23/2014 4:50 PM

## 2014-07-21 ENCOUNTER — Ambulatory Visit (INDEPENDENT_AMBULATORY_CARE_PROVIDER_SITE_OTHER): Payer: Medicare Other | Admitting: Family Medicine

## 2014-07-21 VITALS — BP 132/77 | HR 80 | Temp 97.6°F | Ht 66.5 in | Wt 186.0 lb

## 2014-07-21 DIAGNOSIS — N390 Urinary tract infection, site not specified: Secondary | ICD-10-CM

## 2014-07-21 DIAGNOSIS — R300 Dysuria: Secondary | ICD-10-CM | POA: Diagnosis not present

## 2014-07-21 LAB — POCT UA - MICROSCOPIC ONLY
Casts, Ur, LPF, POC: NEGATIVE
Crystals, Ur, HPF, POC: NEGATIVE
Epithelial cells, urine per micros: NEGATIVE
Mucus, UA: NEGATIVE
RBC, urine, microscopic: NEGATIVE
Yeast, UA: NEGATIVE

## 2014-07-21 LAB — POCT URINALYSIS DIPSTICK
Bilirubin, UA: NEGATIVE
Glucose, UA: NEGATIVE
Ketones, UA: NEGATIVE
Nitrite, UA: NEGATIVE
Protein, UA: NEGATIVE
Spec Grav, UA: <=1.005
Urobilinogen, UA: NEGATIVE
pH, UA: 6

## 2014-07-21 MED ORDER — CIPROFLOXACIN HCL 500 MG PO TABS: 500.0000 mg | ORAL_TABLET | Freq: Two times a day (BID) | ORAL | Status: DC

## 2014-07-21 NOTE — Progress Notes (Signed)
   Subjective:    Patient ID: Abigail Moss, female    DOB: [birth year], 69 y.o.   MRN: [id number]  HPI Patient is here for c/o urinary sx's.  Review of Systems  Constitutional: Negative for fever.  HENT: Negative for ear pain.   Eyes: Negative for discharge.  Respiratory: Negative for cough.   Cardiovascular: Negative for chest pain.  Gastrointestinal: Negative for abdominal distention.  Endocrine: Negative for polyuria.  Genitourinary: Negative for difficulty urinating.  Musculoskeletal: Negative for gait problem and neck pain.  Skin: Negative for color change and rash.  Neurological: Negative for speech difficulty and headaches.  Psychiatric/Behavioral: Negative for agitation.       Objective:    BP 132/77 mmHg  Pulse 80  Temp(Src) 97.6 F (36.4 C) (Oral)  Ht 5' 6.5" (1.689 m)  Wt 186 lb (84.369 kg)  BMI 29.57 kg/m2 Physical Exam  Constitutional: She is oriented to person, place, and time. She appears well-developed and well-nourished.  HENT:  Head: Normocephalic and atraumatic.  Mouth/Throat: Oropharynx is clear and moist.  Eyes: Pupils are equal, round, and reactive to light.  Neck: Normal range of motion. Neck supple.  Cardiovascular: Normal rate and regular rhythm.   No murmur heard. Pulmonary/Chest: Effort normal and breath sounds normal.  Abdominal: Soft. Bowel sounds are normal. There is no tenderness.  Neurological: She is alert and oriented to person, place, and time.  Skin: Skin is warm and dry.  Psychiatric: She has a normal mood and affect.          Assessment & Plan:     ICD-9-CM ICD-10-CM   1. Dysuria 788.1 R30.0 POCT UA - Microscopic Only     POCT urinalysis dipstick     ciprofloxacin (CIPRO) 500 MG tablet  2. Urinary tract infection without hematuria, site unspecified 599.0 N39.0 ciprofloxacin (CIPRO) 500 MG tablet     No Follow-up on file.  Deatra CanterWilliam J Celisa Schoenberg FNP

## 2014-08-11 DIAGNOSIS — M25511 Pain in right shoulder: Secondary | ICD-10-CM | POA: Diagnosis not present

## 2014-08-29 ENCOUNTER — Encounter: Payer: Self-pay | Admitting: Family

## 2014-08-29 ENCOUNTER — Ambulatory Visit (INDEPENDENT_AMBULATORY_CARE_PROVIDER_SITE_OTHER): Payer: Medicare Other | Admitting: Family

## 2014-08-29 VITALS — BP 135/88 | HR 83 | Temp 97.9°F | Ht 66.5 in | Wt 184.8 lb

## 2014-08-29 DIAGNOSIS — F411 Generalized anxiety disorder: Secondary | ICD-10-CM | POA: Diagnosis not present

## 2014-08-29 DIAGNOSIS — J019 Acute sinusitis, unspecified: Secondary | ICD-10-CM

## 2014-08-29 MED ORDER — AMOXICILLIN 500 MG PO CAPS: 500.0000 mg | ORAL_CAPSULE | Freq: Two times a day (BID) | ORAL | Status: DC

## 2014-08-29 MED ORDER — METHYLPREDNISOLONE (PAK) 4 MG PO TABS: ORAL_TABLET | ORAL | Status: DC

## 2014-08-29 MED ORDER — ALPRAZOLAM 0.5 MG PO TABS
0.5000 mg | ORAL_TABLET | Freq: Every evening | ORAL | Status: DC | PRN
Start: 2014-08-29 — End: 2015-02-26

## 2014-08-29 NOTE — Patient Instructions (Signed)
Sinusitis Sinusitis is redness, soreness, and inflammation of the paranasal sinuses. Paranasal sinuses are air pockets within the bones of your face (beneath the eyes, the middle of the forehead, or above the eyes). In healthy paranasal sinuses, mucus is able to drain out, and air is able to circulate through them by way of your nose. However, when your paranasal sinuses are inflamed, mucus and air can become trapped. This can allow bacteria and other germs to grow and cause infection. Sinusitis can develop quickly and last only a short time (acute) or continue over a long period (chronic). Sinusitis that lasts for more than 12 weeks is considered chronic.  CAUSES  Causes of sinusitis include:  Allergies.  Structural abnormalities, such as displacement of the cartilage that separates your nostrils (deviated septum), which can decrease the air flow through your nose and sinuses and affect sinus drainage.  Functional abnormalities, such as when the small hairs (cilia) that line your sinuses and help remove mucus do not work properly or are not present. SIGNS AND SYMPTOMS  Symptoms of acute and chronic sinusitis are the same. The primary symptoms are pain and pressure around the affected sinuses. Other symptoms include:  Upper toothache.  Earache.  Headache.  Bad breath.  Decreased sense of smell and taste.  A cough, which worsens when you are lying flat.  Fatigue.  Fever.  Thick drainage from your nose, which often is green and may contain pus (purulent).  Swelling and warmth over the affected sinuses. DIAGNOSIS  Your health care provider will perform a physical exam. During the exam, your health care provider may:  Look in your nose for signs of abnormal growths in your nostrils (nasal polyps).  Tap over the affected sinus to check for signs of infection.  View the inside of your sinuses (endoscopy) using an imaging device that has a light attached (endoscope). If your health  care provider suspects that you have chronic sinusitis, one or more of the following tests may be recommended:  Allergy tests.  Nasal culture. A sample of mucus is taken from your nose, sent to a lab, and screened for bacteria.  Nasal cytology. A sample of mucus is taken from your nose and examined by your health care provider to determine if your sinusitis is related to an allergy. TREATMENT  Most cases of acute sinusitis are related to a viral infection and will resolve on their own within 10 days. Sometimes medicines are prescribed to help relieve symptoms (pain medicine, decongestants, nasal steroid sprays, or saline sprays).  However, for sinusitis related to a bacterial infection, your health care provider will prescribe antibiotic medicines. These are medicines that will help kill the bacteria causing the infection.  Rarely, sinusitis is caused by a fungal infection. In theses cases, your health care provider will prescribe antifungal medicine. For some cases of chronic sinusitis, surgery is needed. Generally, these are cases in which sinusitis recurs more than 3 times per year, despite other treatments. HOME CARE INSTRUCTIONS   Drink plenty of water. Water helps thin the mucus so your sinuses can drain more easily.  Use a humidifier.  Inhale steam 3 to 4 times a day (for example, sit in the bathroom with the shower running).  Apply a warm, moist washcloth to your face 3 to 4 times a day, or as directed by your health care provider.  Use saline nasal sprays to help moisten and clean your sinuses.  Take medicines only as directed by your health care provider.    If you were prescribed either an antibiotic or antifungal medicine, finish it all even if you start to feel better. SEEK IMMEDIATE MEDICAL CARE IF:  You have increasing pain or severe headaches.  You have nausea, vomiting, or drowsiness.  You have swelling around your face.  You have vision problems.  You have a stiff  neck.  You have difficulty breathing. MAKE SURE YOU:   Understand these instructions.  Will watch your condition.  Will get help right away if you are not doing well or get worse. Document Released: 06/23/2005 Document Revised: 11/07/2013 Document Reviewed: 07/08/2011 [employer] Patient Information 2015 [employer]. This information is not intended to replace advice given to you by your health care provider. Make sure you discuss any questions you have with your health care provider.  - Take meds as prescribed - Use a cool mist humidifier  -Use saline nose sprays frequently -Saline irrigations of the nose can be very helpful if done frequently.  * 4X daily for 1 week*  * Use of a nettie pot can be helpful with this. Follow directions with this* -Force fluids -For any cough or congestion  Use plain Mucinex- regular strength or max strength is fine   * Children- consult with Pharmacist for dosing -For fever or aces or pains- take tylenol or ibuprofen appropriate for age and weight.  * for fevers greater than 101 orally you may alternate ibuprofen and tylenol every  3 hours. -Throat lozenges if help   Daymond Cordts, FNP  

## 2014-08-29 NOTE — Progress Notes (Signed)
Subjective:    Patient ID: Abigail Moss, female    DOB: [birth year], 69 y.o.   MRN: [id number]  Headache  Associated symptoms include ear pain and sinus pressure. Pertinent negatives include no coughing or sore throat.  Sinusitis This is a recurrent problem. The current episode started in the past 7 days. The problem has been gradually worsening since onset. There has been no fever. Her pain is at a severity of 4/10. The pain is mild. Associated symptoms include chills, congestion, ear pain, headaches, sinus pressure and sneezing. Pertinent negatives include no coughing, shortness of breath or sore throat. Past treatments include oral decongestants. The treatment provided mild relief.      Review of Systems  Constitutional: Positive for chills.  HENT: Positive for congestion, ear pain, sinus pressure and sneezing. Negative for sore throat.   Eyes: Negative.   Respiratory: Negative.  Negative for cough and shortness of breath.   Cardiovascular: Negative.  Negative for palpitations.  Gastrointestinal: Negative.   Endocrine: Negative.   Genitourinary: Negative.   Musculoskeletal: Negative.   Neurological: Positive for headaches.  Hematological: Negative.   Psychiatric/Behavioral: Negative.   All other systems reviewed and are negative.      Objective:   Physical Exam  Constitutional: She is oriented to person, place, and time. She appears well-developed and well-nourished. No distress.  HENT:  Head: Normocephalic and atraumatic.  Right Ear: External ear normal.  Left Ear: External ear normal.  Mouth/Throat: Oropharynx is clear and moist.  Nasal passage erythemas with mild swelling    Eyes: Pupils are equal, round, and reactive to light.  Neck: Normal range of motion. Neck supple. No thyromegaly present.  Cardiovascular: Normal rate, regular rhythm, normal heart sounds and intact distal pulses.   No murmur heard. Pulmonary/Chest: Effort normal and breath sounds normal. No  respiratory distress. She has no wheezes.  Abdominal: Soft. Bowel sounds are normal. She exhibits no distension. There is no tenderness.  Musculoskeletal: Normal range of motion. She exhibits no edema or tenderness.  Neurological: She is alert and oriented to person, place, and time. She has normal reflexes. No cranial nerve deficit.  Skin: Skin is warm and dry.  Psychiatric: She has a normal mood and affect. Her behavior is normal. Judgment and thought content normal.  Vitals reviewed.     BP 135/88 mmHg  Pulse 83  Temp(Src) 97.9 F (36.6 C) (Oral)  Ht 5' 6.5" (1.689 m)  Wt 184 lb 12.8 oz (83.825 kg)  BMI 29.38 kg/m2     Assessment & Plan:  1. Acute sinusitis, recurrence not specified, unspecified location - Take meds as prescribed - Use a cool mist humidifier  -Use saline nose sprays frequently -Saline irrigations of the nose can be very helpful if done frequently.  * 4X daily for 1 week*  * Use of a nettie pot can be helpful with this. Follow directions with this* -Force fluids -For any cough or congestion  Use plain Mucinex- regular strength or max strength is fine   * Children- consult with Pharmacist for dosing -For fever or aces or pains- take tylenol or ibuprofen appropriate for age and weight.  * for fevers greater than 101 orally you may alternate ibuprofen and tylenol every  3 hours. -Throat lozenges if help - methylPREDNIsolone (MEDROL DOSPACK) 4 MG tablet; follow package directions  Dispense: 21 tablet; Refill: 0 - amoxicillin (AMOXIL) 500 MG capsule; Take 1 capsule (500 mg total) by mouth 2 (two) times daily.  Dispense: 14 capsule;  Refill: 0  2. Anxiety state - ALPRAZolam (XANAX) 0.5 MG tablet; Take 1 tablet (0.5 mg total) by mouth at bedtime as needed for sleep.  Dispense: 30 tablet; Refill: 2  Jannifer Rodney, FNP

## 2014-09-27 ENCOUNTER — Other Ambulatory Visit: Payer: Self-pay | Admitting: Family

## 2014-11-01 ENCOUNTER — Ambulatory Visit: Payer: Medicare Other | Admitting: *Deleted

## 2014-11-21 ENCOUNTER — Encounter: Payer: Self-pay | Admitting: *Deleted

## 2014-11-21 ENCOUNTER — Ambulatory Visit (INDEPENDENT_AMBULATORY_CARE_PROVIDER_SITE_OTHER): Payer: Medicare Other

## 2014-11-21 ENCOUNTER — Ambulatory Visit (INDEPENDENT_AMBULATORY_CARE_PROVIDER_SITE_OTHER): Payer: Medicare Other | Admitting: *Deleted

## 2014-11-21 VITALS — BP 126/84 | HR 66 | Ht 66.0 in | Wt 189.0 lb

## 2014-11-21 DIAGNOSIS — E038 Other specified hypothyroidism: Secondary | ICD-10-CM

## 2014-11-21 DIAGNOSIS — J302 Other seasonal allergic rhinitis: Secondary | ICD-10-CM

## 2014-11-21 DIAGNOSIS — Z780 Asymptomatic menopausal state: Secondary | ICD-10-CM | POA: Diagnosis not present

## 2014-11-21 DIAGNOSIS — Z0189 Encounter for other specified special examinations: Secondary | ICD-10-CM | POA: Diagnosis not present

## 2014-11-21 DIAGNOSIS — E039 Hypothyroidism, unspecified: Secondary | ICD-10-CM

## 2014-11-21 DIAGNOSIS — H6504 Acute serous otitis media, recurrent, right ear: Secondary | ICD-10-CM

## 2014-11-21 DIAGNOSIS — Z23 Encounter for immunization: Secondary | ICD-10-CM

## 2014-11-21 DIAGNOSIS — Z0000 Encounter for general adult medical examination without abnormal findings: Secondary | ICD-10-CM | POA: Diagnosis not present

## 2014-11-21 DIAGNOSIS — H60391 Other infective otitis externa, right ear: Secondary | ICD-10-CM

## 2014-11-21 LAB — POCT URINALYSIS DIPSTICK
BILIRUBIN UA: NEGATIVE
Glucose, UA: NEGATIVE
KETONES UA: NEGATIVE
Leukocytes, UA: NEGATIVE
Nitrite, UA: NEGATIVE
PH UA: 6
Protein, UA: NEGATIVE
RBC UA: NEGATIVE
SPEC GRAV UA: 1.015
Urobilinogen, UA: NEGATIVE

## 2014-11-21 LAB — POCT UA - MICROSCOPIC ONLY
Bacteria, U Microscopic: NEGATIVE
CASTS, UR, LPF, POC: NEGATIVE
CRYSTALS, UR, HPF, POC: NEGATIVE
Epithelial cells, urine per micros: NEGATIVE
Mucus, UA: NEGATIVE
RBC, URINE, MICROSCOPIC: NEGATIVE
WBC, Ur, HPF, POC: NEGATIVE
Yeast, UA: NEGATIVE

## 2014-11-21 MED ORDER — BUPROPION HCL ER (XL) 300 MG PO TB24: ORAL_TABLET | ORAL | Status: DC

## 2014-11-21 MED ORDER — LOSARTAN POTASSIUM 50 MG PO TABS: 50.0000 mg | ORAL_TABLET | Freq: Every day | ORAL | Status: DC

## 2014-11-21 MED ORDER — LEVOTHYROXINE SODIUM 125 MCG PO TABS: 137.0000 ug | ORAL_TABLET | Freq: Every day | ORAL | Status: DC

## 2014-11-21 MED ORDER — FLUTICASONE PROPIONATE 50 MCG/ACT NA SUSP: 1.0000 | Freq: Every day | NASAL | Status: DC

## 2014-11-21 MED ORDER — HYDROCORTISONE-ACETIC ACID 1-2 % OT SOLN: 4.0000 [drp] | Freq: Four times a day (QID) | OTIC | Status: DC

## 2014-11-21 MED ORDER — MONTELUKAST SODIUM 10 MG PO TABS: ORAL_TABLET | ORAL | Status: DC

## 2014-11-21 MED ORDER — OMEPRAZOLE 40 MG PO CPDR: DELAYED_RELEASE_CAPSULE | ORAL | Status: DC

## 2014-11-21 NOTE — Patient Instructions (Addendum)
Keep appointment for physical on 12/11/14 Increase exercise to include walking for 30 minutes at least 3 days per week  Health Maintenance Adopting a healthy lifestyle and getting preventive care can go a long way to promote health and wellness. Talk with your health care provider about what schedule of regular examinations is right for you. This is a good chance for you to check in with your provider about disease prevention and staying healthy. In between checkups, there are plenty of things you can do on your own. Experts have done a lot of research about which lifestyle changes and preventive measures are most likely to keep you healthy. Ask your health care provider for more information. WEIGHT AND DIET  Eat a healthy diet  Be sure to include plenty of vegetables, fruits, low-fat dairy products, and lean protein.  Do not eat a lot of foods high in solid fats, added sugars, or salt.  Get regular exercise. This is one of the most important things you can do for your health.  Most adults should exercise for at least 150 minutes each week. The exercise should increase your heart rate and make you sweat (moderate-intensity exercise).  Most adults should also do strengthening exercises at least twice a week. This is in addition to the moderate-intensity exercise.  Maintain a healthy weight  Body mass index (BMI) is a measurement that can be used to identify possible weight problems. It estimates body fat based on height and weight. Your health care provider can help determine your BMI and help you achieve or maintain a healthy weight.  For females 18 years of age and older:   A BMI below 18.5 is considered underweight.  A BMI of 18.5 to 24.9 is normal.  A BMI of 25 to 29.9 is considered overweight.  A BMI of 30 and above is considered obese.  Watch levels of cholesterol and blood lipids  You should start having your blood tested for lipids and cholesterol at 71 years of age, then  have this test every 5 years.  You may need to have your cholesterol levels checked more often if:  Your lipid or cholesterol levels are high.  You are older than 71 years of age.  You are at high risk for heart disease.  CANCER SCREENING   Lung Cancer  Lung cancer screening is recommended for adults 48-71 years old who are at high risk for lung cancer because of a history of smoking.  A yearly low-dose CT scan of the lungs is recommended for people who:  Currently smoke.  Have quit within the past 15 years.  Have at least a 30-pack-year history of smoking. A pack year is smoking an average of one pack of cigarettes a day for 1 year.  Yearly screening should continue until it has been 15 years since you quit.  Yearly screening should stop if you develop a health problem that would prevent you from having lung cancer treatment.  Breast Cancer  Practice breast self-awareness. This means understanding how your breasts normally appear and feel.  It also means doing regular breast self-exams. Let your health care provider know about any changes, no matter how small.  If you are in your 20s or 30s, you should have a clinical breast exam (CBE) by a health care provider every 1-3 years as part of a regular health exam.  If you are 49 or older, have a CBE every year. Also consider having a breast X-ray (mammogram) every year.  If  you have a family history of breast cancer, talk to your health care provider about genetic screening.  If you are at high risk for breast cancer, talk to your health care provider about having an MRI and a mammogram every year.  Breast cancer gene (BRCA) assessment is recommended for women who have family members with BRCA-related cancers. BRCA-related cancers include:  Breast.  Ovarian.  Tubal.  Peritoneal cancers.  Results of the assessment will determine the need for genetic counseling and BRCA1 and BRCA2 testing. Cervical Cancer Routine  pelvic examinations to screen for cervical cancer are no longer recommended for nonpregnant women who are considered low risk for cancer of the pelvic organs (ovaries, uterus, and vagina) and who do not have symptoms. A pelvic examination may be necessary if you have symptoms including those associated with pelvic infections. Ask your health care provider if a screening pelvic exam is right for you.   The Pap test is the screening test for cervical cancer for women who are considered at risk.  If you had a hysterectomy for a problem that was not cancer or a condition that could lead to cancer, then you no longer need Pap tests.  If you are older than 65 years, and you have had normal Pap tests for the past 10 years, you no longer need to have Pap tests.  If you have had past treatment for cervical cancer or a condition that could lead to cancer, you need Pap tests and screening for cancer for at least 20 years after your treatment.  If you no longer get a Pap test, assess your risk factors if they change (such as having a new sexual partner). This can affect whether you should start being screened again.  Some women have medical problems that increase their chance of getting cervical cancer. If this is the case for you, your health care provider may recommend more frequent screening and Pap tests.  The human papillomavirus (HPV) test is another test that may be used for cervical cancer screening. The HPV test looks for the virus that can cause cell changes in the cervix. The cells collected during the Pap test can be tested for HPV.  The HPV test can be used to screen women 81 years of age and older. Getting tested for HPV can extend the interval between normal Pap tests from three to five years.  An HPV test also should be used to screen women of any age who have unclear Pap test results.  After 71 years of age, women should have HPV testing as often as Pap tests.  Colorectal Cancer  This  type of cancer can be detected and often prevented.  Routine colorectal cancer screening usually begins at 71 years of age and continues through 71 years of age.  Your health care provider may recommend screening at an earlier age if you have risk factors for colon cancer.  Your health care provider may also recommend using home test kits to check for hidden blood in the stool.  A small camera at the end of a tube can be used to examine your colon directly (sigmoidoscopy or colonoscopy). This is done to check for the earliest forms of colorectal cancer.  Routine screening usually begins at age 8.  Direct examination of the colon should be repeated every 5-10 years through 71 years of age. However, you may need to be screened more often if early forms of precancerous polyps or small growths are found. Skin Cancer  Check your skin from head to toe regularly.  Tell your health care provider about any new moles or changes in moles, especially if there is a change in a mole's shape or color.  Also tell your health care provider if you have a mole that is larger than the size of a pencil eraser.  Always use sunscreen. Apply sunscreen liberally and repeatedly throughout the day.  Protect yourself by wearing long sleeves, pants, a wide-brimmed hat, and sunglasses whenever you are outside. HEART DISEASE, DIABETES, AND HIGH BLOOD PRESSURE   Have your blood pressure checked at least every 1-2 years. High blood pressure causes heart disease and increases the risk of stroke.  If you are between 39 years and 50 years old, ask your health care provider if you should take aspirin to prevent strokes.  Have regular diabetes screenings. This involves taking a blood sample to check your fasting blood sugar level.  If you are at a normal weight and have a low risk for diabetes, have this test once every three years after 71 years of age.  If you are overweight and have a high risk for diabetes,  consider being tested at a younger age or more often. PREVENTING INFECTION  Hepatitis B  If you have a higher risk for hepatitis B, you should be screened for this virus. You are considered at high risk for hepatitis B if:  You were born in a country where hepatitis B is common. Ask your health care provider which countries are considered high risk.  Your parents were born in a high-risk country, and you have not been immunized against hepatitis B (hepatitis B vaccine).  You have HIV or AIDS.  You use needles to inject street drugs.  You live with someone who has hepatitis B.  You have had sex with someone who has hepatitis B.  You get hemodialysis treatment.  You take certain medicines for conditions, including cancer, organ transplantation, and autoimmune conditions. Hepatitis C  Blood testing is recommended for:  Everyone born from [birth year] through 1967.  Anyone with known risk factors for hepatitis C. Sexually transmitted infections (STIs)  You should be screened for sexually transmitted infections (STIs) including gonorrhea and chlamydia if:  You are sexually active and are younger than 71 years of age.  You are older than 71 years of age and your health care provider tells you that you are at risk for this type of infection.  Your sexual activity has changed since you were last screened and you are at an increased risk for chlamydia or gonorrhea. Ask your health care provider if you are at risk.  If you do not have HIV, but are at risk, it may be recommended that you take a prescription medicine daily to prevent HIV infection. This is called pre-exposure prophylaxis (PrEP). You are considered at risk if:  You are sexually active and do not regularly use condoms or know the HIV status of your partner(s).  You take drugs by injection.  You are sexually active with a partner who has HIV. Talk with your health care provider about whether you are at high risk of being  infected with HIV. If you choose to begin PrEP, you should first be tested for HIV. You should then be tested every 3 months for as long as you are taking PrEP.  PREGNANCY   If you are premenopausal and you may become pregnant, ask your health care provider about preconception counseling.  If you may become pregnant,  take 400 to 800 micrograms (mcg) of folic acid every day.  If you want to prevent pregnancy, talk to your health care provider about birth control (contraception). OSTEOPOROSIS AND MENOPAUSE   Osteoporosis is a disease in which the bones lose minerals and strength with aging. This can result in serious bone fractures. Your risk for osteoporosis can be identified using a bone density scan.  If you are 55 years of age or older, or if you are at risk for osteoporosis and fractures, ask your health care provider if you should be screened.  Ask your health care provider whether you should take a calcium or vitamin D supplement to lower your risk for osteoporosis.  Menopause may have certain physical symptoms and risks.  Hormone replacement therapy may reduce some of these symptoms and risks. Talk to your health care provider about whether hormone replacement therapy is right for you.  HOME CARE INSTRUCTIONS   Schedule regular health, dental, and eye exams.  Stay current with your immunizations.   Do not use any tobacco products including cigarettes, chewing tobacco, or electronic cigarettes.  If you are pregnant, do not drink alcohol.  If you are breastfeeding, limit how much and how often you drink alcohol.  Limit alcohol intake to no more than 1 drink per day for nonpregnant women. One drink equals 12 ounces of beer, 5 ounces of wine, or 1 ounces of hard liquor.  Do not use street drugs.  Do not share needles.  Ask your health care provider for help if you need support or information about quitting drugs.  Tell your health care provider if you often feel  depressed.  Tell your health care provider if you have ever been abused or do not feel safe at home. Document Released: 01/06/2011 Document Revised: 11/07/2013 Document Reviewed: 05/25/2013 [facility] Patient Information 2015 [place], [place]. This information is not intended to replace advice given to you by your health care provider. Make sure you discuss any questions you have with your health care provider.   Pneumococcal Vaccine, Polyvalent suspension for injection What is this medicine? PNEUMOCOCCAL VACCINE, POLYVALENT (NEU mo KOK al vak SEEN, pol ee VEY luhnt) is a vaccine to prevent pneumococcus bacteria infection. These bacteria are a major cause of ear infections, 'Strep throat' infections, and serious pneumonia, meningitis, or blood infections worldwide. These vaccines help the body to produce antibodies (protective substances) that help your body defend against these bacteria. This vaccine is recommended for infants and young children. This vaccine will not treat an infection. This medicine may be used for other purposes; ask your health care provider or pharmacist if you have questions. COMMON BRAND NAME(S): Prevnar 13 What should I tell my health care provider before I take this medicine? They need to know if you have any of these conditions: -bleeding problems -fever -immune system problems -low platelet count in the blood -seizures -an unusual or allergic reaction to pneumococcal vaccine, diphtheria toxoid, other vaccines, latex, other medicines, foods, dyes, or preservatives -pregnant or trying to get pregnant -breast-feeding How should I use this medicine? This vaccine is for injection into a muscle. It is given by a health care professional. A copy of Vaccine Information Statements will be given before each vaccination. Read this sheet carefully each time. The sheet may change frequently. Talk to your pediatrician regarding the use of this medicine in children. While this  drug may be prescribed for children as young as 40 weeks old for selected conditions, precautions do apply. Overdosage: If  you think you have taken too much of this medicine contact a poison control center or emergency room at once. NOTE: This medicine is only for you. Do not share this medicine with others. What if I miss a dose? It is important not to miss your dose. Call your doctor or health care professional if you are unable to keep an appointment. What may interact with this medicine? -medicines for cancer chemotherapy -medicines that suppress your immune function -medicines that treat or prevent blood clots like warfarin, enoxaparin, and dalteparin -steroid medicines like prednisone or cortisone This list may not describe all possible interactions. Give your health care provider a list of all the medicines, herbs, non-prescription drugs, or dietary supplements you use. Also tell them if you smoke, drink alcohol, or use illegal drugs. Some items may interact with your medicine. What should I watch for while using this medicine? Mild fever and pain should go away in 3 days or less. Report any unusual symptoms to your doctor or health care professional. What side effects may I notice from receiving this medicine? Side effects that you should report to your doctor or health care professional as soon as possible: -allergic reactions like skin rash, itching or hives, swelling of the face, lips, or tongue -breathing problems -confused -fever over 102 degrees F -pain, tingling, numbness in the hands or feet -seizures -unusual bleeding or bruising -unusual muscle weakness Side effects that usually do not require medical attention (report to your doctor or health care professional if they continue or are bothersome): -aches and pains -diarrhea -fever of 102 degrees F or less -headache -irritable -loss of appetite -pain, tender at site where injected -trouble sleeping This list may not  describe all possible side effects. Call your doctor for medical advice about side effects. You may report side effects to FDA at 1-800-FDA-1088. Where should I keep my medicine? This does not apply. This vaccine is given in a clinic, pharmacy, doctor's office, or other health care setting and will not be stored at home. NOTE: This sheet is a summary. It may not cover all possible information. If you have questions about this medicine, talk to your doctor, pharmacist, or health care provider.  2015, [employer]vier/Gold Standard. (2008-09-05 10:17:22)

## 2014-11-21 NOTE — Progress Notes (Signed)
Patient ID: Abigail Moss, female   DOB: [birth year], 69 y.o.   MRN: [id number]   Subjective:   Abigail Moss is a 69 y.o. female who presents for an Initial Medicare Annual Wellness Visit. Abigail Moss is married and lives at home with her husband. They have children and grandchildren. She works around her house a lot and stays moving. When resting she enjoys reading.  Review of Systems     Cardiac Risk Factors include: advanced age (>47men, >60 women);hypertension  Musculoskeletal: Intermittent right knee pain and mild swelling after activity. It improves with rest, ice, and Aleve.  Other systems negative     Objective:   BP 126/84 P 66 Wt 189 Ht  BMI 30.51  Current Medications (verified) Outpatient Encounter Prescriptions as of 11/21/2014  Medication Sig  . acetic acid-hydrocortisone (VOSOL-HC) otic solution Place 4 drops into both ears 4 (four) times daily.  Marland Kitchen ALPRAZolam (XANAX) 0.5 MG tablet Take 1 tablet (0.5 mg total) by mouth at bedtime as needed for sleep.  Marland Kitchen aspirin EC 81 MG tablet Take 81 mg by mouth daily.  Marland Kitchen buPROPion (WELLBUTRIN XL) 300 MG 24 hr tablet TAKE 1 TABLET (300 MG TOTAL)  BY MOUTH DAILY.  Marland Kitchen Cholecalciferol (VITAMIN D-3 PO) Take 800 mg by mouth daily.  . fluticasone (FLONASE) 50 MCG/ACT nasal spray Place 1 spray into both nostrils daily.  Marland Kitchen levothyroxine (SYNTHROID, LEVOTHROID) 125 MCG tablet Take 1 tablet (125 mcg total) by mouth daily before breakfast.  . losartan (COZAAR) 50 MG tablet Take 1 tablet (50 mg total) by mouth daily.  . montelukast (SINGULAIR) 10 MG tablet TAKE ONE TABLET BY MOUTH AT BEDTIME  . MULTIPLE VITAMIN PO Take by mouth.  Marland Kitchen omeprazole (PRILOSEC) 40 MG capsule TAKE 1 CAPSULE (40 MG TOTAL)  BY MOUTH DAILY.  . [DISCONTINUED] acetic acid-hydrocortisone (VOSOL-HC) otic solution Place 4 drops into both ears 4 (four) times daily.  . [DISCONTINUED] amoxicillin (AMOXIL) 500 MG capsule TAKE 1 CAPSULE (500 MG TOTAL) BY MOUTH 2 (TWO) TIMES DAILY.  .  [DISCONTINUED] buPROPion (WELLBUTRIN XL) 300 MG 24 hr tablet TAKE 1 TABLET (300 MG TOTAL)  BY MOUTH DAILY.  . [DISCONTINUED] fluticasone (FLONASE) 50 MCG/ACT nasal spray Place 1 spray into both nostrils daily.  . [DISCONTINUED] levothyroxine (SYNTHROID, LEVOTHROID) 125 MCG tablet Take 1 tablet (125 mcg total) by mouth daily before breakfast.  . [DISCONTINUED] losartan (COZAAR) 50 MG tablet Take 1 tablet (50 mg total) by mouth daily.  . [DISCONTINUED] methylPREDNIsolone (MEDROL DOSPACK) 4 MG tablet follow package directions  . [DISCONTINUED] montelukast (SINGULAIR) 10 MG tablet TAKE ONE TABLET BY MOUTH AT BEDTIME  . [DISCONTINUED] omeprazole (PRILOSEC) 40 MG capsule TAKE 1 CAPSULE (40 MG TOTAL)  BY MOUTH DAILY.   No facility-administered encounter medications on file as of 11/21/2014.    Allergies (verified) Neomycin; Sulfur; and Latex -Rash  History: Past Medical History  Diagnosis Date  . GERD (gastroesophageal reflux disease)   . Asthma   . Hypertension   . Allergy   . CAD (coronary artery disease)   . Osteopenia   . Thyroid disease    Past Surgical History  Procedure Laterality Date  . Tonsillectomy    . Ear tube removal    . Rotator cuff repair Left   . Vein ligation and stripping     Family History  Problem Relation Age of Onset  . Heart disease Mother   . Diabetes Father   . Heart disease Sister   . Cancer Brother   .  Cancer Sister     throat   Social History   Occupational History  . Not on file.   Social History Main Topics  . Smoking status: Former Smoker    Quit date: 07/07/1996  . Smokeless tobacco: Never Used  . Alcohol Use: No  . Drug Use: No  . Sexual Activity: Not on file    Activities of Daily Living In your present state of health, do you have any difficulty performing the following activities: 11/21/2014  Hearing? Y  Vision? N  Difficulty concentrating or making decisions? N  Walking or climbing stairs? N  Dressing or bathing? N  Doing  errands, shopping? N  Preparing Food and eating ? N  Using the Toilet? N  In the past six months, have you accidently leaked urine? N  Do you have problems with loss of bowel control? N  Managing your Medications? N  Managing your Finances? N  Housekeeping or managing your Housekeeping? N    Immunizations and Health Maintenance Immunization History  Administered Date(s) Administered  . Influenza,inj,Quad PF,36+ Mos 04/07/2013  . Pneumococcal Conjugate-13 11/21/2014  . Pneumococcal Polysaccharide-23 01/02/2011  . Td 04/06/2006   Health Maintenance Due  Topic Date Due  . DEXA SCAN  02/16/2009  . PNA vac Low Risk Adult (1 of 2 - PCV13) 02/16/2009  Dexa scan done today and Prevnar given  Patient Care Team: Frederica KusterStephen M Miller, MD as PCP - General (Family Medicine) Hart Carwinora M Brodie, MD as Consulting Physician (Gastroenterology) Landry Mellowarey Pahel, AUD (Audiology) Claretta FraiseYen Thi Mardene SayerHong Le, MD as Referring Physician (Optometry)   Assessment:   This is a routine wellness examination for Abigail Moss.   Hearing/Vision screen No hearing or vision deficits noted during visit today  Dietary issues and exercise activities discussed: Current Exercise Habits:: Home exercise routine (Patient does not sit around a lot. She moves around her house most of the day.), Type of exercise: walking, Time (Minutes): 15, Frequency (Times/Week): 2, Weekly Exercise (Minutes/Week): 30, Intensity: Mild  Goals    . Exercise 3x per week (30 min per time)     Walk for at least 30 minutes 3 times per week      Depression Screen PHQ 2/9 Scores 11/21/2014 05/18/2014 10/11/2013  PHQ - 2 Score 0 0 0    Fall Risk Fall Risk  11/21/2014 05/18/2014 10/11/2013  Falls in the past year? No No No    Cognitive Function: MMSE - Mini Mental State Exam 11/21/2014  Orientation to time 5  Orientation to Place 5  Registration 3  Attention/ Calculation 5  Recall 3  Language- name 2 objects 2  Language- repeat 1  Language- follow 3 step command  3  Language- read & follow direction 1  Write a sentence 1  Copy design 1  Total score 30    Screening Tests Health Maintenance  Topic Date Due  . DEXA SCAN  02/16/2009  . PNA vac Low Risk Adult (1 of 2 - PCV13) 02/16/2009  . ZOSTAVAX  12/22/2014 (Originally 02/17/2004)  . INFLUENZA VACCINE  02/05/2015  . TETANUS/TDAP  04/06/2016  . MAMMOGRAM  06/05/2016  . COLONOSCOPY  10/04/2019      Plan:   Physical exam scheduled with Jannifer Rodneyhristy [name], FNP for 12/11/14. Review Dexa and labs. Return FOBT Continue to read and exercise her mind Review advanced directives with family. Once signed and notarized bring a copy to our office.   During the course of the visit, Abigail Moss was educated and counseled about the following appropriate  screening and preventive services:   Vaccines to include Pneumoccal-complete, Influenza-needed yearly, Tdap-due 2017, Zostavax-too expensive, will check cost at next visit  Electrocardiogram-Due during physical exam  Cardiovascular disease screening-FOBT given. Last colonoscopy 2011 and due in 2021,  Bone density screening-Done today  Diabetes screening-Glucose checked at routine office visits  Glaucoma screening-Eye exam was 02/2014. Asked to have report sent to our office  Mammography- done 06/05/14  Exercise-Encouraged her to do more formal exercises like moderately intense walking for 30 minutes at least 3 days per week in addition to her normal routine    Patient Instructions (the written plan) were given to the patient.    Demetrios LollKristen Hudy, RN   11/21/2014      I have reviewed and agree with the above AWV documentation.  Mechele ClaudeWarren Stacks, M.D.

## 2014-11-22 ENCOUNTER — Other Ambulatory Visit: Payer: Self-pay | Admitting: Family

## 2014-11-22 LAB — CMP14+EGFR
ALT: 22 IU/L (ref 0–32)
AST: 24 IU/L (ref 0–40)
Albumin/Globulin Ratio: 1.6 (ref 1.1–2.5)
Albumin: 4.1 g/dL (ref 3.5–4.8)
Alkaline Phosphatase: 62 IU/L (ref 39–117)
BUN/Creatinine Ratio: 17 (ref 11–26)
BUN: 12 mg/dL (ref 8–27)
Bilirubin Total: 0.4 mg/dL (ref 0.0–1.2)
CALCIUM: 9.3 mg/dL (ref 8.7–10.3)
CO2: 26 mmol/L (ref 18–29)
CREATININE: 0.71 mg/dL (ref 0.57–1.00)
Chloride: 105 mmol/L (ref 97–108)
GFR calc Af Amer: 100 mL/min/{1.73_m2} (ref >59)
GFR calc non Af Amer: 87 mL/min/{1.73_m2} (ref >59)
Globulin, Total: 2.6 g/dL (ref 1.5–4.5)
Glucose: 85 mg/dL (ref 65–99)
POTASSIUM: 4.3 mmol/L (ref 3.5–5.2)
Sodium: 144 mmol/L (ref 134–144)
TOTAL PROTEIN: 6.7 g/dL (ref 6.0–8.5)

## 2014-11-22 LAB — NMR, LIPOPROFILE
CHOLESTEROL: 157 mg/dL (ref 100–199)
HDL Cholesterol by NMR: 51 mg/dL (ref >39)
HDL Particle Number: 29.2 umol/L — ABNORMAL LOW (ref >=30.5)
LDL Particle Number: 1183 nmol/L — ABNORMAL HIGH (ref <1000)
LDL Size: 20.7 nm (ref >20.5)
LDL-C: 86 mg/dL (ref 0–99)
LP-IR SCORE: 51 — AB (ref <=45)
Small LDL Particle Number: 534 nmol/L — ABNORMAL HIGH (ref <=527)
Triglycerides by NMR: 102 mg/dL (ref 0–149)

## 2014-11-22 LAB — THYROID PANEL WITH TSH
FREE THYROXINE INDEX: 3 (ref 1.2–4.9)
T3 UPTAKE RATIO: 28 % (ref 24–39)
T4, Total: 10.8 ug/dL (ref 4.5–12.0)
TSH: 0.421 u[IU]/mL — ABNORMAL LOW (ref 0.450–4.500)

## 2014-11-22 MED ORDER — LEVOTHYROXINE SODIUM 112 MCG PO TABS: 112.0000 ug | ORAL_TABLET | Freq: Every day | ORAL | Status: DC

## 2014-11-22 NOTE — Progress Notes (Signed)
Quick Note:  Urine negative for UTI Kidney and liver function stable Cholesterol levels elevated- Pt needs to be on low fat diet Thyroid levels abnormal- Levothyroxine decreased to 112 mcg- Prescription sent to pharmacy   ______

## 2014-12-11 ENCOUNTER — Encounter: Payer: Self-pay | Admitting: Family

## 2014-12-11 ENCOUNTER — Ambulatory Visit (INDEPENDENT_AMBULATORY_CARE_PROVIDER_SITE_OTHER): Payer: Medicare Other | Admitting: Family

## 2014-12-11 VITALS — BP 139/94 | HR 69 | Temp 97.9°F | Ht 66.0 in | Wt 188.4 lb

## 2014-12-11 DIAGNOSIS — Z0000 Encounter for general adult medical examination without abnormal findings: Secondary | ICD-10-CM | POA: Diagnosis not present

## 2014-12-11 DIAGNOSIS — Z1212 Encounter for screening for malignant neoplasm of rectum: Secondary | ICD-10-CM

## 2014-12-11 DIAGNOSIS — I4510 Unspecified right bundle-branch block: Secondary | ICD-10-CM

## 2014-12-11 DIAGNOSIS — J309 Allergic rhinitis, unspecified: Secondary | ICD-10-CM | POA: Insufficient documentation

## 2014-12-11 DIAGNOSIS — M810 Age-related osteoporosis without current pathological fracture: Secondary | ICD-10-CM | POA: Insufficient documentation

## 2014-12-11 DIAGNOSIS — E039 Hypothyroidism, unspecified: Secondary | ICD-10-CM

## 2014-12-11 DIAGNOSIS — E559 Vitamin D deficiency, unspecified: Secondary | ICD-10-CM

## 2014-12-11 DIAGNOSIS — I10 Essential (primary) hypertension: Secondary | ICD-10-CM | POA: Diagnosis not present

## 2014-12-11 DIAGNOSIS — K219 Gastro-esophageal reflux disease without esophagitis: Secondary | ICD-10-CM

## 2014-12-11 MED ORDER — ALENDRONATE SODIUM 70 MG PO TABS: 70.0000 mg | ORAL_TABLET | ORAL | Status: DC

## 2014-12-11 MED ORDER — LOSARTAN POTASSIUM 100 MG PO TABS: 100.0000 mg | ORAL_TABLET | Freq: Every day | ORAL | Status: DC

## 2014-12-11 NOTE — Addendum Note (Signed)
Addended by: Prescott GumLAND, Adalbert Alberto M on: 12/11/2014 02:34 PM   Modules accepted: Orders, SmartSet

## 2014-12-11 NOTE — Progress Notes (Signed)
Subjective:    Patient ID: Cameron SprangRosa S Mom, female    DOB: 11/19/1945, 69 y.o.   MRN: [id number]  Hypertension This is a chronic problem. The current episode started more than 1 year ago. The problem has been resolved since onset. The problem is controlled. Associated symptoms include anxiety. Pertinent negatives include no headaches, palpitations, peripheral edema or shortness of breath. Risk factors for coronary artery disease include family history, obesity and post-menopausal state. Past treatments include angiotensin blockers. The current treatment provides moderate improvement. Hypertensive end-organ damage includes a thyroid problem. There is no history of kidney disease, CAD/MI, CVA or heart failure. There is no history of sleep apnea.  Thyroid Problem Presents for follow-up visit. Patient reports no anxiety, depressed mood, diarrhea, dry skin, hair loss or palpitations. The symptoms have been stable. Past treatments include levothyroxine. The treatment provided significant relief. There is no history of heart failure.  Gastrophageal Reflux She reports no belching, no coughing, no heartburn or no sore throat. This is a chronic problem. The current episode started more than 1 year ago. The problem occurs rarely. The problem has been resolved. The symptoms are aggravated by certain foods. She has tried a PPI for the symptoms. The treatment provided significant relief.  Anxiety Presents for follow-up visit. Patient reports no depressed mood, excessive worry, irritability, nervous/anxious behavior, palpitations or shortness of breath. The severity of symptoms is mild.   Her past medical history is significant for anxiety/panic attacks. Past treatments include benzodiazephines and SSRIs. The treatment provided significant relief.      Review of Systems  Constitutional: Negative.  Negative for irritability.  HENT: Negative.  Negative for sore throat.   Eyes: Negative.   Respiratory: Negative.   Negative for cough and shortness of breath.   Cardiovascular: Negative.  Negative for palpitations.  Gastrointestinal: Negative.  Negative for heartburn and diarrhea.  Endocrine: Negative.   Genitourinary: Negative.   Musculoskeletal: Negative.   Neurological: Negative.  Negative for headaches.  Hematological: Negative.   Psychiatric/Behavioral: Negative.  The patient is not nervous/anxious.   All other systems reviewed and are negative.      Objective:   Physical Exam  Constitutional: She is oriented to person, place, and time. She appears well-developed and well-nourished. No distress.  HENT:  Head: Normocephalic and atraumatic.  Right Ear: External ear normal.  Left Ear: External ear normal.  Nose: Nose normal.  Mouth/Throat: Oropharynx is clear and moist.  Eyes: Pupils are equal, round, and reactive to light.  Neck: Normal range of motion. Neck supple. No thyromegaly present.  Cardiovascular: Normal rate, regular rhythm, normal heart sounds and intact distal pulses.   No murmur heard. Pulmonary/Chest: Effort normal and breath sounds normal. No respiratory distress. She has no wheezes.  Abdominal: Soft. Bowel sounds are normal. She exhibits no distension. There is no tenderness.  Musculoskeletal: Normal range of motion. She exhibits no edema or tenderness.  Neurological: She is alert and oriented to person, place, and time. She has normal reflexes. No cranial nerve deficit.  Skin: Skin is warm and dry.  Psychiatric: She has a normal mood and affect. Her behavior is normal. Judgment and thought content normal.  Vitals reviewed.    BP 139/94 mmHg  Pulse 69  Temp(Src) 97.9 F (36.6 C) (Oral)  Ht 5\' 6"  (1.676 m)  Wt 188 lb 6.4 oz (85.458 kg)  BMI 30.42 kg/m2      Assessment & Plan:  1. Essential hypertension Losartan increased to 100 mg from 50 mg  today -Dash diet information given -Exercise encouraged - Stress Management  -Continue current meds -RTO in 2 weeks -   losartan (COZAAR) 100 MG tablet; Take 1 tablet (100 mg total) by mouth daily.  Dispense: 90 tablet; Refill: 3  2. Gastroesophageal reflux disease, esophagitis presence not specified  3. Annual physical exam - EKG 12-Lead  4. Hypothyroidism, unspecified hypothyroidism type  5. Allergic rhinitis, unspecified allergic rhinitis type  6. Vitamin D deficiency  7. Osteoporosis - alendronate (FOSAMAX) 70 MG tablet; Take 1 tablet (70 mg total) by mouth every 7 (seven) days. Take with a full glass of water on an empty stomach.  Dispense: 4 tablet; Refill: 11  8. Right bundle branch block - Ambulatory referral to Cardiology   Continue all meds Labs pending Health Maintenance reviewed Diet and exercise encouraged RTO 2 weeks to recheck HTN  Jannifer Rodney, FNP

## 2014-12-11 NOTE — Patient Instructions (Addendum)
DASH Eating Plan DASH stands for "Dietary Approaches to Stop Hypertension." The DASH eating plan is a healthy eating plan that has been shown to reduce high blood pressure (hypertension). Additional health benefits may include reducing the risk of type 2 diabetes mellitus, heart disease, and stroke. The DASH eating plan may also help with weight loss. WHAT DO I NEED TO KNOW ABOUT THE DASH EATING PLAN? For the DASH eating plan, you will follow these general guidelines:  Choose foods with a percent daily value for sodium of less than 5% (as listed on the food label).  Use salt-free seasonings or herbs instead of table salt or sea salt.  Check with your health care provider or pharmacist before using salt substitutes.  Eat lower-sodium products, often labeled as "lower sodium" or "no salt added."  Eat fresh foods.  Eat more vegetables, fruits, and low-fat dairy products.  Choose whole grains. Look for the word "whole" as the first word in the ingredient list.  Choose fish and skinless chicken or turkey more often than red meat. Limit fish, poultry, and meat to 6 oz (170 g) each day.  Limit sweets, desserts, sugars, and sugary drinks.  Choose heart-healthy fats.  Limit cheese to 1 oz (28 g) per day.  Eat more home-cooked food and less restaurant, buffet, and fast food.  Limit fried foods.  Cook foods using methods other than frying.  Limit canned vegetables. If you do use them, rinse them well to decrease the sodium.  When eating at a restaurant, ask that your food be prepared with less salt, or no salt if possible. WHAT FOODS CAN I EAT? Seek help from a dietitian for individual calorie needs. Grains Whole grain or whole wheat bread. Brown rice. Whole grain or whole wheat pasta. Quinoa, bulgur, and whole grain cereals. Low-sodium cereals. Corn or whole wheat flour tortillas. Whole grain cornbread. Whole grain crackers. Low-sodium crackers. Vegetables Fresh or frozen vegetables  (raw, steamed, roasted, or grilled). Low-sodium or reduced-sodium tomato and vegetable juices. Low-sodium or reduced-sodium tomato sauce and paste. Low-sodium or reduced-sodium canned vegetables.  Fruits All fresh, canned (in natural juice), or frozen fruits. Meat and Other Protein Products Ground beef (85% or leaner), grass-fed beef, or beef trimmed of fat. Skinless chicken or turkey. Ground chicken or turkey. Pork trimmed of fat. All fish and seafood. Eggs. Dried beans, peas, or lentils. Unsalted nuts and seeds. Unsalted canned beans. Dairy Low-fat dairy products, such as skim or 1% milk, 2% or reduced-fat cheeses, low-fat ricotta or cottage cheese, or plain low-fat yogurt. Low-sodium or reduced-sodium cheeses. Fats and Oils Tub margarines without trans fats. Light or reduced-fat mayonnaise and salad dressings (reduced sodium). Avocado. Safflower, olive, or canola oils. Natural peanut or almond butter. Other Unsalted popcorn and pretzels. The items listed above may not be a complete list of recommended foods or beverages. Contact your dietitian for more options. WHAT FOODS ARE NOT RECOMMENDED? Grains White bread. White pasta. White rice. Refined cornbread. Bagels and croissants. Crackers that contain trans fat. Vegetables Creamed or fried vegetables. Vegetables in a cheese sauce. Regular canned vegetables. Regular canned tomato sauce and paste. Regular tomato and vegetable juices. Fruits Dried fruits. Canned fruit in light or heavy syrup. Fruit juice. Meat and Other Protein Products Fatty cuts of meat. Ribs, chicken wings, bacon, sausage, bologna, salami, chitterlings, fatback, hot dogs, bratwurst, and packaged luncheon meats. Salted nuts and seeds. Canned beans with salt. Dairy Whole or 2% milk, cream, half-and-half, and cream cheese. Whole-fat or sweetened yogurt. Full-fat   cheeses or blue cheese. Nondairy creamers and whipped toppings. Processed cheese, cheese spreads, or cheese  curds. Condiments Onion and garlic salt, seasoned salt, table salt, and sea salt. Canned and packaged gravies. Worcestershire sauce. Tartar sauce. Barbecue sauce. Teriyaki sauce. Soy sauce, including reduced sodium. Steak sauce. Fish sauce. Oyster sauce. Cocktail sauce. Horseradish. Ketchup and mustard. Meat flavorings and tenderizers. Bouillon cubes. Hot sauce. Tabasco sauce. Marinades. Taco seasonings. Relishes. Fats and Oils Butter, stick margarine, lard, shortening, ghee, and bacon fat. Coconut, palm kernel, or palm oils. Regular salad dressings. Other Pickles and olives. Salted popcorn and pretzels. The items listed above may not be a complete list of foods and beverages to avoid. Contact your dietitian for more information. WHERE CAN I FIND MORE INFORMATION? National Heart, Lung, and Blood Institute: travelstabloid.com Document Released: 06/12/2011 Document Revised: 11/07/2013 Document Reviewed: 04/27/2013 [facility] Patient Information 2015 [place], [place]. This information is not intended to replace advice given to you by your health care provider. Make sure you discuss any questions you have with your health care provider. Hypertension Hypertension, commonly called high blood pressure, is when the force of blood pumping through your arteries is too strong. Your arteries are the blood vessels that carry blood from your heart throughout your body. A blood pressure reading consists of a higher number over a lower number, such as 110/72. The higher number (systolic) is the pressure inside your arteries when your heart pumps. The lower number (diastolic) is the pressure inside your arteries when your heart relaxes. Ideally you want your blood pressure below 120/80. Hypertension forces your heart to work harder to pump blood. Your arteries may become narrow or stiff. Having hypertension puts you at risk for heart disease, stroke, and other problems.  RISK  FACTORS Some risk factors for high blood pressure are controllable. Others are not.  Risk factors you cannot control include:   Race. You may be at higher risk if you are African American.  Age. Risk increases with age.  Gender. Men are at higher risk than women before age 37 years. After age 55, women are at higher risk than men. Risk factors you can control include:  Not getting enough exercise or physical activity.  Being overweight.  Getting too much fat, sugar, calories, or salt in your diet.  Drinking too much alcohol. SIGNS AND SYMPTOMS Hypertension does not usually cause signs or symptoms. Extremely high blood pressure (hypertensive crisis) may cause headache, anxiety, shortness of breath, and nosebleed. DIAGNOSIS  To check if you have hypertension, your health care provider will measure your blood pressure while you are seated, with your arm held at the level of your heart. It should be measured at least twice using the same arm. Certain conditions can cause a difference in blood pressure between your right and left arms. A blood pressure reading that is higher than normal on one occasion does not mean that you need treatment. If one blood pressure reading is high, ask your health care provider about having it checked again. TREATMENT  Treating high blood pressure includes making lifestyle changes and possibly taking medicine. Living a healthy lifestyle can help lower high blood pressure. You may need to change some of your habits. Lifestyle changes may include:  Following the DASH diet. This diet is high in fruits, vegetables, and whole grains. It is low in salt, red meat, and added sugars.  Getting at least 2 hours of brisk physical activity every week.  Losing weight if necessary.  Not smoking.  Limiting  alcoholic beverages.  Learning ways to reduce stress. If lifestyle changes are not enough to get your blood pressure under control, your health care provider may  prescribe medicine. You may need to take more than one. Work closely with your health care provider to understand the risks and benefits. HOME CARE INSTRUCTIONS  Have your blood pressure rechecked as directed by your health care provider.   Take medicines only as directed by your health care provider. Follow the directions carefully. Blood pressure medicines must be taken as prescribed. The medicine does not work as well when you skip doses. Skipping doses also puts you at risk for problems.   Do not smoke.   Monitor your blood pressure at home as directed by your health care provider. SEEK MEDICAL CARE IF:   You think you are having a reaction to medicines taken.  You have recurrent headaches or feel dizzy.  You have swelling in your ankles.  You have trouble with your vision. SEEK IMMEDIATE MEDICAL CARE IF:  You develop a severe headache or confusion.  You have unusual weakness, numbness, or feel faint.  You have severe chest or abdominal pain.  You vomit repeatedly.  You have trouble breathing. MAKE SURE YOU:   Understand these instructions.  Will watch your condition.  Will get help right away if you are not doing well or get worse. Document Released: 06/23/2005 Document Revised: 11/07/2013 Document Reviewed: 04/15/2013 [facility] Patient Information 2015 [place], [place]. This information is not intended to replace advice given to you by your health care provider. Make sure you discuss any questions you have with your health care provider. Osteoporosis Throughout your life, your body breaks down old bone and replaces it with new bone. As you get older, your body does not replace bone as quickly as it breaks it down. By the age of 30 years, most people begin to gradually lose bone because of the imbalance between bone loss and replacement. Some people lose more bone than others. Bone loss beyond a specified normal degree is considered osteoporosis.  Osteoporosis affects  the strength and durability of your bones. The inside of the ends of your bones and your flat bones, like the bones of your pelvis, look like honeycomb, filled with tiny open spaces. As bone loss occurs, your bones become less dense. This means that the open spaces inside your bones become bigger and the walls between these spaces become thinner. This makes your bones weaker. Bones of a person with osteoporosis can become so weak that they can break (fracture) during minor accidents, such as a simple fall. CAUSES  The following factors have been associated with the development of osteoporosis:  Smoking.  Drinking more than 2 alcoholic drinks several days per week.  Long-term use of certain medicines:  Corticosteroids.  Chemotherapy medicines.  Thyroid medicines.  Antiepileptic medicines.  Gonadal hormone suppression medicine.  Immunosuppression medicine.  Being underweight.  Lack of physical activity.  Lack of exposure to the sun. This can lead to vitamin D deficiency.  Certain medical conditions:  Certain inflammatory bowel diseases, such as Crohn disease and ulcerative colitis.  Diabetes.  Hyperthyroidism.  Hyperparathyroidism. RISK FACTORS Anyone can develop osteoporosis. However, the following factors can increase your risk of developing osteoporosis:  Gender--Women are at higher risk than men.  Age--Being older than 50 years increases your risk.  Ethnicity--White and Asian people have an increased risk.  Weight --Being extremely underweight can increase your risk of osteoporosis.  Family history of osteoporosis--Having a family  member who has developed osteoporosis can increase your risk. SYMPTOMS  Usually, people with osteoporosis have no symptoms.  DIAGNOSIS  Signs during a physical exam that may prompt your caregiver to suspect osteoporosis include:  Decreased height. This is usually caused by the compression of the bones that form your spine (vertebrae)  because they have weakened and become fractured.  A curving or rounding of the upper back (kyphosis). To confirm signs of osteoporosis, your caregiver may request a procedure that uses 2 low-dose X-ray beams with different levels of energy to measure your bone mineral density (dual-energy X-ray absorptiometry [DXA]). Also, your caregiver may check your level of vitamin D. TREATMENT  The goal of osteoporosis treatment is to strengthen bones in order to decrease the risk of bone fractures. There are different types of medicines available to help achieve this goal. Some of these medicines work by slowing the processes of bone loss. Some medicines work by increasing bone density. Treatment also involves making sure that your levels of calcium and vitamin D are adequate. PREVENTION  There are things you can do to help prevent osteoporosis. Adequate intake of calcium and vitamin D can help you achieve optimal bone mineral density. Regular exercise can also help, especially resistance and weight-bearing activities. If you smoke, quitting smoking is an important part of osteoporosis prevention. MAKE SURE YOU:  Understand these instructions.  Will watch your condition.  Will get help right away if you are not doing well or get worse. FOR MORE INFORMATION www.osteo.org and [employer].cawww.nof.org Document Released: 04/02/2005 Document Revised: 10/18/2012 Document Reviewed: 06/07/2011 [facility] Patient Information 2015 [place]. This information is not intended to replace advice given to you by your health care provider. Make sure you discuss any questions you have with your health care provider.

## 2014-12-13 LAB — FECAL OCCULT BLOOD, IMMUNOCHEMICAL: Fecal Occult Bld: POSITIVE — AB

## 2014-12-21 ENCOUNTER — Other Ambulatory Visit (INDEPENDENT_AMBULATORY_CARE_PROVIDER_SITE_OTHER): Payer: Medicare Other

## 2014-12-21 DIAGNOSIS — K921 Melena: Secondary | ICD-10-CM | POA: Diagnosis not present

## 2014-12-21 LAB — POCT CBC
Granulocyte percent: 55.9 %G (ref 37–80)
HEMATOCRIT: 38 % (ref 37.7–47.9)
Hemoglobin: 12.5 g/dL (ref 12.2–16.2)
Lymph, poc: 1.6 (ref 0.6–3.4)
MCH: 30.8 pg (ref 27–31.2)
MCHC: 32.8 g/dL (ref 31.8–35.4)
MCV: 93.9 fL (ref 80–97)
MPV: 7 fL (ref 0–99.8)
POC GRANULOCYTE: 2.7 (ref 2–6.9)
POC LYMPH PERCENT: 32.4 %L (ref 10–50)
Platelet Count, POC: 232 10*3/uL (ref 142–424)
RBC: 4.04 M/uL (ref 4.04–5.48)
RDW, POC: 13 %
WBC: 4.9 10*3/uL (ref 4.6–10.2)

## 2014-12-21 NOTE — Progress Notes (Signed)
Lab only 

## 2014-12-26 ENCOUNTER — Encounter: Payer: Self-pay | Admitting: Cardiovascular Disease

## 2014-12-27 ENCOUNTER — Encounter: Payer: Self-pay | Admitting: Family

## 2014-12-27 ENCOUNTER — Ambulatory Visit (INDEPENDENT_AMBULATORY_CARE_PROVIDER_SITE_OTHER): Payer: Medicare Other | Admitting: Family

## 2014-12-27 VITALS — BP 137/87 | HR 65 | Temp 98.2°F | Ht 66.0 in | Wt 187.5 lb

## 2014-12-27 DIAGNOSIS — I10 Essential (primary) hypertension: Secondary | ICD-10-CM

## 2014-12-27 NOTE — Progress Notes (Signed)
   Subjective:    Patient ID: Abigail Moss, female    DOB: 03/27/1946, 69 y.o.   MRN: [id number]  Pt presents to the office to recheck HTN. Pt's BP is at goal today.  Hypertension The current episode started more than 1 year ago. The problem has been waxing and waning since onset. The problem is controlled. Pertinent negatives include no anxiety, headaches, palpitations, peripheral edema or shortness of breath. Risk factors for coronary artery disease include post-menopausal state, obesity and family history. Past treatments include angiotensin blockers. The current treatment provides significant improvement. Hypertensive end-organ damage includes a thyroid problem. There is no history of kidney disease, CAD/MI, CVA or heart failure. There is no history of sleep apnea.      Review of Systems  Constitutional: Negative.   HENT: Negative.   Eyes: Negative.   Respiratory: Negative.  Negative for shortness of breath.   Cardiovascular: Negative.  Negative for palpitations.  Gastrointestinal: Negative.   Endocrine: Negative.   Genitourinary: Negative.   Musculoskeletal: Negative.   Neurological: Negative.  Negative for headaches.  Hematological: Negative.   Psychiatric/Behavioral: Negative.   All other systems reviewed and are negative.      Objective:   Physical Exam  Constitutional: She is oriented to person, place, and time. She appears well-developed and well-nourished. No distress.  HENT:  Head: Normocephalic and atraumatic.  Eyes: Pupils are equal, round, and reactive to light.  Neck: Normal range of motion. Neck supple. No thyromegaly present.  Cardiovascular: Normal rate, regular rhythm, normal heart sounds and intact distal pulses.   No murmur heard. Pulmonary/Chest: Effort normal and breath sounds normal. No respiratory distress. She has no wheezes.  Abdominal: Soft. Bowel sounds are normal. She exhibits no distension. There is no tenderness.  Musculoskeletal: Normal range  of motion. She exhibits no edema or tenderness.  Neurological: She is alert and oriented to person, place, and time. She has normal reflexes. No cranial nerve deficit.  Skin: Skin is warm and dry.  Psychiatric: She has a normal mood and affect. Her behavior is normal. Judgment and thought content normal.  Vitals reviewed.     BP 137/92 mmHg  Pulse 72  Temp(Src) 98.2 F (36.8 C) (Oral)  Ht $R'5\' 6"'pe$  (1.676 m)  Wt 187 lb 8 oz (85.049 kg)  BMI 30.28 kg/m2     Assessment & Plan:  1. Essential hypertension -Daily blood pressure log given with instructions on how to fill out and told to bring to next visit -Dash diet information given -Exercise encouraged - Stress Management  -Continue current meds -RTO in 6 months - BMP8+EGFR  [name], FNP

## 2014-12-27 NOTE — Patient Instructions (Signed)
DASH Eating Plan DASH stands for "Dietary Approaches to Stop Hypertension." The DASH eating plan is a healthy eating plan that has been shown to reduce high blood pressure (hypertension). Additional health benefits may include reducing the risk of type 2 diabetes mellitus, heart disease, and stroke. The DASH eating plan may also help with weight loss. WHAT DO I NEED TO KNOW ABOUT THE DASH EATING PLAN? For the DASH eating plan, you will follow these general guidelines:  Choose foods with a percent daily value for sodium of less than 5% (as listed on the food label).  Use salt-free seasonings or herbs instead of table salt or sea salt.  Check with your health care provider or pharmacist before using salt substitutes.  Eat lower-sodium products, often labeled as "lower sodium" or "no salt added."  Eat fresh foods.  Eat more vegetables, fruits, and low-fat dairy products.  Choose whole grains. Look for the word "whole" as the first word in the ingredient list.  Choose fish and skinless chicken or turkey more often than red meat. Limit fish, poultry, and meat to 6 oz (170 g) each day.  Limit sweets, desserts, sugars, and sugary drinks.  Choose heart-healthy fats.  Limit cheese to 1 oz (28 g) per day.  Eat more home-cooked food and less restaurant, buffet, and fast food.  Limit fried foods.  Cook foods using methods other than frying.  Limit canned vegetables. If you do use them, rinse them well to decrease the sodium.  When eating at a restaurant, ask that your food be prepared with less salt, or no salt if possible. WHAT FOODS CAN I EAT? Seek help from a dietitian for individual calorie needs. Grains Whole grain or whole wheat bread. Brown rice. Whole grain or whole wheat pasta. Quinoa, bulgur, and whole grain cereals. Low-sodium cereals. Corn or whole wheat flour tortillas. Whole grain cornbread. Whole grain crackers. Low-sodium crackers. Vegetables Fresh or frozen vegetables  (raw, steamed, roasted, or grilled). Low-sodium or reduced-sodium tomato and vegetable juices. Low-sodium or reduced-sodium tomato sauce and paste. Low-sodium or reduced-sodium canned vegetables.  Fruits All fresh, canned (in natural juice), or frozen fruits. Meat and Other Protein Products Ground beef (85% or leaner), grass-fed beef, or beef trimmed of fat. Skinless chicken or turkey. Ground chicken or turkey. Pork trimmed of fat. All fish and seafood. Eggs. Dried beans, peas, or lentils. Unsalted nuts and seeds. Unsalted canned beans. Dairy Low-fat dairy products, such as skim or 1% milk, 2% or reduced-fat cheeses, low-fat ricotta or cottage cheese, or plain low-fat yogurt. Low-sodium or reduced-sodium cheeses. Fats and Oils Tub margarines without trans fats. Light or reduced-fat mayonnaise and salad dressings (reduced sodium). Avocado. Safflower, olive, or canola oils. Natural peanut or almond butter. Other Unsalted popcorn and pretzels. The items listed above may not be a complete list of recommended foods or beverages. Contact your dietitian for more options. WHAT FOODS ARE NOT RECOMMENDED? Grains White bread. White pasta. White rice. Refined cornbread. Bagels and croissants. Crackers that contain trans fat. Vegetables Creamed or fried vegetables. Vegetables in a cheese sauce. Regular canned vegetables. Regular canned tomato sauce and paste. Regular tomato and vegetable juices. Fruits Dried fruits. Canned fruit in light or heavy syrup. Fruit juice. Meat and Other Protein Products Fatty cuts of meat. Ribs, chicken wings, bacon, sausage, bologna, salami, chitterlings, fatback, hot dogs, bratwurst, and packaged luncheon meats. Salted nuts and seeds. Canned beans with salt. Dairy Whole or 2% milk, cream, half-and-half, and cream cheese. Whole-fat or sweetened yogurt. Full-fat   cheeses or blue cheese. Nondairy creamers and whipped toppings. Processed cheese, cheese spreads, or cheese  curds. Condiments Onion and garlic salt, seasoned salt, table salt, and sea salt. Canned and packaged gravies. Worcestershire sauce. Tartar sauce. Barbecue sauce. Teriyaki sauce. Soy sauce, including reduced sodium. Steak sauce. Fish sauce. Oyster sauce. Cocktail sauce. Horseradish. Ketchup and mustard. Meat flavorings and tenderizers. Bouillon cubes. Hot sauce. Tabasco sauce. Marinades. Taco seasonings. Relishes. Fats and Oils Butter, stick margarine, lard, shortening, ghee, and bacon fat. Coconut, palm kernel, or palm oils. Regular salad dressings. Other Pickles and olives. Salted popcorn and pretzels. The items listed above may not be a complete list of foods and beverages to avoid. Contact your dietitian for more information. WHERE CAN I FIND MORE INFORMATION? National Heart, Lung, and [employer] Institute: www.nhlbi.nih.gov/health/health-topics/topics/dash/ Document Released: 06/12/2011 Document Revised: 11/07/2013 Document Reviewed: 04/27/2013 ExitCare Patient Information 2015 [employer]. This information is not intended to replace advice given to you by your health care provider. Make sure you discuss any questions you have with your health care provider. Hypertension Hypertension, commonly called high blood pressure, is when the force of blood pumping through your arteries is too strong. Your arteries are the blood vessels that carry blood from your heart throughout your body. A blood pressure reading consists of a higher number over a lower number, such as 110/72. The higher number (systolic) is the pressure inside your arteries when your heart pumps. The lower number (diastolic) is the pressure inside your arteries when your heart relaxes. Ideally you want your blood pressure below 120/80. Hypertension forces your heart to work harder to pump blood. Your arteries may become narrow or stiff. Having hypertension puts you at risk for heart disease, stroke, and other problems.  RISK  FACTORS Some risk factors for high blood pressure are controllable. Others are not.  Risk factors you cannot control include:   Race. You may be at higher risk if you are African American.  Age. Risk increases with age.  Gender. Men are at higher risk than women before age 45 years. After age 65, women are at higher risk than men. Risk factors you can control include:  Not getting enough exercise or physical activity.  Being overweight.  Getting too much fat, sugar, calories, or salt in your diet.  Drinking too much alcohol. SIGNS AND SYMPTOMS Hypertension does not usually cause signs or symptoms. Extremely high blood pressure (hypertensive crisis) may cause headache, anxiety, shortness of breath, and nosebleed. DIAGNOSIS  To check if you have hypertension, your health care provider will measure your blood pressure while you are seated, with your arm held at the level of your heart. It should be measured at least twice using the same arm. Certain conditions can cause a difference in blood pressure between your right and left arms. A blood pressure reading that is higher than normal on one occasion does not mean that you need treatment. If one blood pressure reading is high, ask your health care provider about having it checked again. TREATMENT  Treating high blood pressure includes making lifestyle changes and possibly taking medicine. Living a healthy lifestyle can help lower high blood pressure. You may need to change some of your habits. Lifestyle changes may include:  Following the DASH diet. This diet is high in fruits, vegetables, and whole grains. It is low in salt, red meat, and added sugars.  Getting at least 2 hours of brisk physical activity every week.  Losing weight if necessary.  Not smoking.  Limiting   alcoholic beverages.  Learning ways to reduce stress. If lifestyle changes are not enough to get your blood pressure under control, your health care provider may  prescribe medicine. You may need to take more than one. Work closely with your health care provider to understand the risks and benefits. HOME CARE INSTRUCTIONS  Have your blood pressure rechecked as directed by your health care provider.   Take medicines only as directed by your health care provider. Follow the directions carefully. Blood pressure medicines must be taken as prescribed. The medicine does not work as well when you skip doses. Skipping doses also puts you at risk for problems.   Do not smoke.   Monitor your blood pressure at home as directed by your health care provider. SEEK MEDICAL CARE IF:   You think you are having a reaction to medicines taken.  You have recurrent headaches or feel dizzy.  You have swelling in your ankles.  You have trouble with your vision. SEEK IMMEDIATE MEDICAL CARE IF:  You develop a severe headache or confusion.  You have unusual weakness, numbness, or feel faint.  You have severe chest or abdominal pain.  You vomit repeatedly.  You have trouble breathing. MAKE SURE YOU:   Understand these instructions.  Will watch your condition.  Will get help right away if you are not doing well or get worse. Document Released: 06/23/2005 Document Revised: 11/07/2013 Document Reviewed: 04/15/2013 ExitCare Patient Information 2015 [employer]. This information is not intended to replace advice given to you by your health care provider. Make sure you discuss any questions you have with your health care provider.  

## 2014-12-28 LAB — BMP8+EGFR
BUN/Creatinine Ratio: 16 (ref 11–26)
BUN: 12 mg/dL (ref 8–27)
CALCIUM: 9.3 mg/dL (ref 8.7–10.3)
CO2: 23 mmol/L (ref 18–29)
Chloride: 106 mmol/L (ref 97–108)
Creatinine, Ser: 0.73 mg/dL (ref 0.57–1.00)
GFR calc Af Amer: 96 mL/min/{1.73_m2} (ref >59)
GFR calc non Af Amer: 84 mL/min/{1.73_m2} (ref >59)
Glucose: 79 mg/dL (ref 65–99)
Potassium: 4 mmol/L (ref 3.5–5.2)
Sodium: 143 mmol/L (ref 134–144)

## 2015-02-01 DIAGNOSIS — H908 Mixed conductive and sensorineural hearing loss, unspecified: Secondary | ICD-10-CM | POA: Diagnosis not present

## 2015-02-03 ENCOUNTER — Ambulatory Visit (INDEPENDENT_AMBULATORY_CARE_PROVIDER_SITE_OTHER): Payer: Medicare Other | Admitting: Family Medicine

## 2015-02-03 VITALS — BP 138/88 | HR 62 | Temp 97.8°F | Ht 66.0 in | Wt 188.2 lb

## 2015-02-03 DIAGNOSIS — H6505 Acute serous otitis media, recurrent, left ear: Secondary | ICD-10-CM

## 2015-02-03 MED ORDER — PREDNISONE 10 MG PO TABS: 10.0000 mg | ORAL_TABLET | Freq: Every day | ORAL | Status: DC

## 2015-02-03 MED ORDER — AMOXICILLIN 875 MG PO TABS: 875.0000 mg | ORAL_TABLET | Freq: Two times a day (BID) | ORAL | Status: DC

## 2015-02-03 NOTE — Progress Notes (Signed)
Subjective:  Patient ID: Abigail Moss, female    DOB: 1945/12/18  Age: 69 y.o. MRN: [id number]  CC: Ear Pain and Allergies   HPI Abigail Moss presents for left ear pain onset 3 days ago, mild. Worsening until moderate today. No fever. Denies decrease in hearing acutely, but some chronically.   History Abigail Moss has a past medical history of GERD (gastroesophageal reflux disease); Asthma; Hypertension; Allergy; CAD (coronary artery disease); Osteopenia; and Thyroid disease.   She has past surgical history that includes Tonsillectomy; Ear tube removal; Rotator cuff repair (Left); and Vein ligation and stripping.   Her family history includes Cancer in her brother and sister; Diabetes in her father; Heart disease in her mother and sister.She reports that she quit smoking about 18 years ago. She has never used smokeless tobacco. She reports that she does not drink alcohol or use illicit drugs.  Outpatient Prescriptions Prior to Visit  Medication Sig Dispense Refill  . alendronate (FOSAMAX) 70 MG tablet Take 1 tablet (70 mg total) by mouth every 7 (seven) days. Take with a full glass of water on an empty stomach. 4 tablet 11  . ALPRAZolam (XANAX) 0.5 MG tablet Take 1 tablet (0.5 mg total) by mouth at bedtime as needed for sleep. 30 tablet 2  . aspirin EC 81 MG tablet Take 81 mg by mouth daily.    Marland Kitchen buPROPion (WELLBUTRIN XL) 300 MG 24 hr tablet TAKE 1 TABLET (300 MG TOTAL)  BY MOUTH DAILY. 90 tablet 1  . Calcium Citrate-Vitamin D (CALCIUM CITRATE + PO) Take 600 mg by mouth daily.    . Cholecalciferol (VITAMIN D-3 PO) Take 800 mg by mouth daily.    . fluticasone (FLONASE) 50 MCG/ACT nasal spray Place 1 spray into both nostrils daily. 16 g 2  . levothyroxine (SYNTHROID) 112 MCG tablet Take 1 tablet (112 mcg total) by mouth daily before breakfast. 90 tablet 3  . losartan (COZAAR) 100 MG tablet Take 1 tablet (100 mg total) by mouth daily. 90 tablet 3  . montelukast (SINGULAIR) 10 MG tablet TAKE ONE  TABLET BY MOUTH AT BEDTIME 90 tablet 1  . MULTIPLE VITAMIN PO Take by mouth.    Marland Kitchen omeprazole (PRILOSEC) 40 MG capsule TAKE 1 CAPSULE (40 MG TOTAL)  BY MOUTH DAILY. 90 capsule 1  . acetic acid-hydrocortisone (VOSOL-HC) otic solution Place 4 drops into both ears 4 (four) times daily. 10 mL 0   No facility-administered medications prior to visit.    ROS Review of Systems  Constitutional: Negative for fever, chills, activity change and appetite change.  HENT: Positive for congestion, postnasal drip, rhinorrhea and sinus pressure. Negative for ear discharge, ear pain, hearing loss, nosebleeds, sneezing and trouble swallowing.   Respiratory: Negative for chest tightness and shortness of breath.   Cardiovascular: Negative for chest pain and palpitations.  Skin: Negative for rash.    Objective:  BP 138/88 mmHg  Pulse 62  Temp(Src) 97.8 F (36.6 C) (Oral)  Ht  (1.676 m)  Wt 188 lb 3.2 oz (85.367 kg)  BMI 30.39 kg/m2  BP Readings from Last 3 Encounters:  02/03/15 138/88  12/27/14 137/87  12/11/14 139/94    Wt Readings from Last 3 Encounters:  02/03/15 188 lb 3.2 oz (85.367 kg)  12/27/14 187 lb 8 oz (85.049 kg)  12/11/14 188 lb 6.4 oz (85.458 kg)     Physical Exam  Constitutional: She appears well-developed and well-nourished.  HENT:  Head: Normocephalic and atraumatic.  Right Ear: Tympanic  membrane and external ear normal.  Left Ear: External ear normal. No mastoid tenderness. Tympanic membrane is injected and erythematous. Tympanic membrane is not perforated, not retracted and not bulging. A middle ear effusion is present. No hemotympanum.  Nose: Mucosal edema present. Right sinus exhibits no frontal sinus tenderness. Left sinus exhibits no frontal sinus tenderness.  Mouth/Throat: No oropharyngeal exudate or posterior oropharyngeal erythema.  Neck: No Brudzinski's sign noted.  Pulmonary/Chest: Breath sounds normal. No respiratory distress.  Lymphadenopathy:       Head  (right side): No preauricular adenopathy present.       Head (left side): No preauricular adenopathy present.       Right cervical: No superficial cervical adenopathy present.      Left cervical: No superficial cervical adenopathy present.    No results found for: HGBA1C  Lab Results  Component Value Date   WBC 4.9 12/21/2014   HGB 12.5 12/21/2014   HCT 38.0 12/21/2014   GLUCOSE 79 12/27/2014   CHOL 157 11/21/2014   TRIG 102 11/21/2014   HDL 51 11/21/2014   LDLCALC 100* 10/11/2013   ALT 22 11/21/2014   AST 24 11/21/2014   NA 143 12/27/2014   K 4.0 12/27/2014   CL 106 12/27/2014   CREATININE 0.73 12/27/2014   BUN 12 12/27/2014   CO2 23 12/27/2014   TSH 0.421* 11/21/2014    No results found.  Assessment & Plan:   Abigail Moss was seen today for ear pain and allergies.  Diagnoses and all orders for this visit:  Recurrent acute serous otitis media of left ear Orders: -     amoxicillin (AMOXIL) 875 MG tablet; Take 1 tablet (875 mg total) by mouth 2 (two) times daily.  Other orders -     predniSONE (DELTASONE) 10 MG tablet; Take 1 tablet (10 mg total) by mouth daily. Pt to take 6 day pack as direct  I am having Abigail Moss maintain her aspirin EC, MULTIPLE VITAMIN PO, Cholecalciferol (VITAMIN D-3 PO), ALPRAZolam, omeprazole, montelukast, buPROPion, fluticasone, acetic acid-hydrocortisone, levothyroxine, Calcium Citrate-Vitamin D (CALCIUM CITRATE + PO), losartan, alendronate, amoxicillin, and predniSONE.  Meds ordered this encounter  Medications  . amoxicillin (AMOXIL) 875 MG tablet    Sig: Take 1 tablet (875 mg total) by mouth 2 (two) times daily.    Dispense:  20 tablet    Refill:  0  . predniSONE (DELTASONE) 10 MG tablet    Sig: Take 1 tablet (10 mg total) by mouth daily. Pt to take 6 day pack as direct    Dispense:  24 tablet    Refill:  0     Follow-up: Return if symptoms worsen or fail to improve.  Mechele Claude, M.D.

## 2015-02-21 ENCOUNTER — Ambulatory Visit: Payer: Self-pay | Admitting: Cardiovascular Disease

## 2015-02-26 ENCOUNTER — Other Ambulatory Visit: Payer: Self-pay | Admitting: Family

## 2015-02-26 NOTE — Telephone Encounter (Signed)
Refill called to [facility] 

## 2015-02-26 NOTE — Telephone Encounter (Signed)
Last filled 01/05/15, last seen 12/27/14. Route to pool, nurse call in at [facility]

## 2015-02-28 ENCOUNTER — Ambulatory Visit: Payer: Self-pay | Admitting: Cardiovascular Disease

## 2015-03-14 ENCOUNTER — Ambulatory Visit (INDEPENDENT_AMBULATORY_CARE_PROVIDER_SITE_OTHER): Payer: Medicare Other | Admitting: Cardiology

## 2015-03-14 ENCOUNTER — Encounter: Payer: Self-pay | Admitting: Cardiology

## 2015-03-14 VITALS — BP 124/85 | HR 69 | Ht 66.0 in | Wt 185.0 lb

## 2015-03-14 DIAGNOSIS — R9431 Abnormal electrocardiogram [ECG] [EKG]: Secondary | ICD-10-CM | POA: Diagnosis not present

## 2015-03-14 DIAGNOSIS — I10 Essential (primary) hypertension: Secondary | ICD-10-CM

## 2015-03-14 NOTE — Progress Notes (Signed)
Cardiology Office Note   Date:  03/14/2015   ID:  Abigail, Moss 1945/12/07, MRN [id number]  PCP:  Mechele Claude, MD  Cardiologist:   Rollene Rotunda, MD   No chief complaint on file.     History of Present Illness: Abigail Moss is a 69 y.o. female who presents for evaluation of right bundle branch block. This was newly noticed on EKG although I don't see an old one for comparison.   She does not have a past cardiac history. She doesn't report any prior cardiac testing. She actually feels well. She does household chores and occasionally walks for exercise. With this level of activity she denies any cardiovascular symptoms. The patient denies any new symptoms such as chest discomfort, neck or arm discomfort. There has been no new shortness of breath, PND or orthopnea. There have been no reported palpitations, presyncope or syncope.  Past Medical History  Diagnosis Date  . GERD (gastroesophageal reflux disease)   . Hypertension   . Osteopenia   . Thyroid disease     Past Surgical History  Procedure Laterality Date  . Tonsillectomy    . Ear tube removal    . Rotator cuff repair Left   . Vein ligation and stripping       Current Outpatient Prescriptions  Medication Sig Dispense Refill  . acetic acid-hydrocortisone (VOSOL-HC) otic solution Place 4 drops into both ears 4 (four) times daily. (Patient taking differently: Place 4 drops into both ears as needed. ) 10 mL 0  . alendronate (FOSAMAX) 70 MG tablet Take 1 tablet (70 mg total) by mouth every 7 (seven) days. Take with a full glass of water on an empty stomach. 4 tablet 11  . ALPRAZolam (XANAX) 0.5 MG tablet TAKE ONE TABLET BY MOUTH AT BEDTIME AS NEEDED FOR SLEEP (Patient taking differently: TAKE ONE TABLET BY MOUTH AT BEDTIME AS NEEDED) 30 tablet 2  . aspirin EC 81 MG tablet Take 81 mg by mouth daily.    Marland Kitchen buPROPion (WELLBUTRIN XL) 300 MG 24 hr tablet TAKE 1 TABLET (300 MG TOTAL)  BY MOUTH DAILY. 90 tablet 1  . Calcium  Citrate-Vitamin D (CALCIUM CITRATE + PO) Take 600 mg by mouth daily.    . Cholecalciferol (VITAMIN D-3 PO) Take 800 mg by mouth daily.    . Cranberry 250 MG CAPS Take 1 capsule by mouth daily.    . fluticasone (FLONASE) 50 MCG/ACT nasal spray Place 1 spray into both nostrils daily. 16 g 2  . levothyroxine (SYNTHROID) 112 MCG tablet Take 1 tablet (112 mcg total) by mouth daily before breakfast. 90 tablet 3  . losartan (COZAAR) 100 MG tablet Take 1 tablet (100 mg total) by mouth daily. 90 tablet 3  . montelukast (SINGULAIR) 10 MG tablet TAKE ONE TABLET BY MOUTH AT BEDTIME 90 tablet 1  . MULTIPLE VITAMIN PO Take by mouth.    Marland Kitchen omeprazole (PRILOSEC) 40 MG capsule TAKE 1 CAPSULE (40 MG TOTAL)  BY MOUTH DAILY. 90 capsule 1   No current facility-administered medications for this visit.    Allergies:   Neomycin; Sulfur; and Latex    Social History:  The patient  reports that she quit smoking about 18 years ago. She has never used smokeless tobacco. She reports that she does not drink alcohol or use illicit drugs.   Family History:  The patient's family history includes Cancer in her brother and sister; Diabetes in her father; Heart disease in her sister; Heart disease (age  of onset: 67) in her mother.    ROS:  Please see the history of present illness.   Otherwise, review of systems are positive for  Decreased hearing and occasional dizziness.   All other systems are reviewed and negative.    PHYSICAL EXAM: VS:  BP 124/85 mmHg  Pulse 69  Ht 5\' 6"  (1.676 m)  Wt 185 lb (83.915 kg)  BMI 29.87 kg/m2  SpO2 97% , BMI Body mass index is 29.87 kg/(m^2). GENERAL:  Well appearing HEENT:  Pupils equal round and reactive, fundi not visualized, oral mucosa unremarkable NECK:  No jugular venous distention, waveform within normal limits, carotid upstroke brisk and symmetric, no bruits, no thyromegaly LYMPHATICS:  No cervical, inguinal adenopathy LUNGS:  Clear to auscultation bilaterally BACK:  No CVA  tenderness CHEST:  Unremarkable HEART:  PMI not displaced or sustained,S1 and S2 within normal limits, no S3, no S4, no clicks, no rubs, no murmurs ABD:  Flat, positive bowel sounds normal in frequency in pitch, no bruits, no rebound, no guarding, no midline pulsatile mass, no hepatomegaly, no splenomegaly EXT:  2 plus pulses throughout, no edema, no cyanosis no clubbing SKIN:  No rashes no nodules NEURO:  Cranial nerves II through XII grossly intact, motor grossly intact throughout PSYCH:  Cognitively intact, oriented to person place and time    EKG:  EKG is ordered today. The ekg ordered today demonstrates  Sinus rhythm, rate 66, right bundle branch block , no acute ST-T wave changes.   Recent Labs: 11/21/2014: ALT 22; TSH 0.421* 12/21/2014: Hemoglobin 12.5 12/27/2014: BUN 12; Creatinine, Ser 0.73; Potassium 4.0; Sodium 143    Lipid Panel    Component Value Date/Time   CHOL 157 11/21/2014 1108   CHOL 170 10/11/2013 1013   TRIG 102 11/21/2014 1108   TRIG 65 10/11/2013 1013   HDL 51 11/21/2014 1108   HDL 57 10/11/2013 1013   CHOLHDL 3.0 10/11/2013 1013   LDLCALC 100* 10/11/2013 1013   LDLCALC 108* 04/07/2013 1120      Wt Readings from Last 3 Encounters:  03/14/15 185 lb (83.915 kg)  02/03/15 188 lb 3.2 oz (85.367 kg)  12/27/14 187 lb 8 oz (85.049 kg)      Other studies Reviewed: Additional studies/ records that were reviewed today include: EKG from June. Review of the above records demonstrates:  Please see elsewhere in the note.     ASSESSMENT AND PLAN:  RBBB:   The patient has no cardiovascular symptoms. She has no symptomatic bradycardia arrhythmias. She has a normal physical exam and no significant risk factors. At this point I would not suspect significant structural heart disease or conduction disease that would require further evaluation or treatment. We discussed this at length.  HTN:   Her blood pressure is well controlled. She will continue the meds as  listed.  OVERWEIGHT:   We did discuss weight loss with a specific suggestion for exercise.    Current medicines are reviewed at length with the patient today.  The patient does not have concerns regarding medicines.  The following changes have been made:  no change  Labs/ tests ordered today include:   Orders Placed This Encounter  Procedures  . EKG 12-Lead     Disposition:   FU with me as needed.      Signed, Rollene Rotunda, MD  03/14/2015 1:45 PM    [facility]

## 2015-03-14 NOTE — Patient Instructions (Signed)
Medication Instructions:  The current medical regimen is effective;  continue present plan and medications.  Follow-Up: Follow up as needed.  Thank you for choosing [facility]!!     

## 2015-04-11 DIAGNOSIS — M7541 Impingement syndrome of right shoulder: Secondary | ICD-10-CM | POA: Diagnosis not present

## 2015-04-24 DIAGNOSIS — M2041 Other hammer toe(s) (acquired), right foot: Secondary | ICD-10-CM | POA: Diagnosis not present

## 2015-04-24 DIAGNOSIS — M779 Enthesopathy, unspecified: Secondary | ICD-10-CM | POA: Diagnosis not present

## 2015-04-24 DIAGNOSIS — M79676 Pain in unspecified toe(s): Secondary | ICD-10-CM | POA: Diagnosis not present

## 2015-05-11 ENCOUNTER — Encounter: Payer: Self-pay | Admitting: Family

## 2015-05-11 ENCOUNTER — Ambulatory Visit (INDEPENDENT_AMBULATORY_CARE_PROVIDER_SITE_OTHER): Payer: Medicare Other | Admitting: Family

## 2015-05-11 VITALS — BP 125/88 | HR 78 | Temp 97.5°F | Ht 66.0 in | Wt 181.1 lb

## 2015-05-11 DIAGNOSIS — H65114 Acute and subacute allergic otitis media (mucoid) (sanguinous) (serous), recurrent, right ear: Secondary | ICD-10-CM | POA: Diagnosis not present

## 2015-05-11 DIAGNOSIS — J0100 Acute maxillary sinusitis, unspecified: Secondary | ICD-10-CM

## 2015-05-11 DIAGNOSIS — J302 Other seasonal allergic rhinitis: Secondary | ICD-10-CM

## 2015-05-11 MED ORDER — AMOXICILLIN-POT CLAVULANATE 875-125 MG PO TABS: 1.0000 | ORAL_TABLET | Freq: Two times a day (BID) | ORAL | Status: DC

## 2015-05-11 MED ORDER — FLUTICASONE PROPIONATE 50 MCG/ACT NA SUSP: 2.0000 | Freq: Every day | NASAL | Status: DC

## 2015-05-11 NOTE — Progress Notes (Signed)
Subjective:    Patient ID: Abigail Moss, female    DOB: 1946/04/18, 69 y.o.   MRN: [id number]  Sinus Problem This is a new problem. The current episode started in the past 7 days. The problem has been gradually worsening since onset. There has been no fever. Her pain is at a severity of 3/10. The pain is moderate. Associated symptoms include congestion, coughing ("a little"), ear pain, headaches and sinus pressure. Pertinent negatives include no hoarse voice, shortness of breath or sneezing. Past treatments include acetaminophen and oral decongestants. The treatment provided mild relief.      Review of Systems  Constitutional: Negative.   HENT: Positive for congestion, ear pain and sinus pressure. Negative for hoarse voice and sneezing.   Eyes: Negative.   Respiratory: Positive for cough ("a little"). Negative for shortness of breath.   Cardiovascular: Negative.  Negative for palpitations.  Gastrointestinal: Negative.   Endocrine: Negative.   Genitourinary: Negative.   Musculoskeletal: Negative.   Neurological: Positive for headaches.  Hematological: Negative.   Psychiatric/Behavioral: Negative.   All other systems reviewed and are negative.      Objective:   Physical Exam  Constitutional: She is oriented to person, place, and time. She appears well-developed and well-nourished. No distress.  HENT:  Head: Normocephalic and atraumatic.  Left Ear: There is drainage, swelling and tenderness. Tympanic membrane is bulging. A middle ear effusion is present.  Nose: Right sinus exhibits maxillary sinus tenderness. Left sinus exhibits maxillary sinus tenderness.  Nasal passage erythemas with mild swelling  Oropharynx erythemas   Eyes: Pupils are equal, round, and reactive to light.  Neck: Normal range of motion. Neck supple. No thyromegaly present.  Cardiovascular: Normal rate, regular rhythm, normal heart sounds and intact distal pulses.   No murmur heard. Pulmonary/Chest: Effort  normal and breath sounds normal. No respiratory distress. She has no wheezes.  Abdominal: Soft. Bowel sounds are normal. She exhibits no distension. There is no tenderness.  Musculoskeletal: Normal range of motion. She exhibits no edema or tenderness.  Neurological: She is alert and oriented to person, place, and time. She has normal reflexes. No cranial nerve deficit.  Skin: Skin is warm and dry.  Psychiatric: She has a normal mood and affect. Her behavior is normal. Judgment and thought content normal.  Vitals reviewed.     BP 125/88 mmHg  Pulse 78  Temp(Src) 97.5 F (36.4 C) (Oral)  Ht  (1.676 m)  Wt 181 lb 1.6 oz (82.146 kg)  BMI 29.24 kg/m2     Assessment & Plan:  1. Seasonal allergic rhinitis - fluticasone (FLONASE) 50 MCG/ACT nasal spray; Place 2 sprays into both nostrils daily.  Dispense: 16 g; Refill: 2  2. Acute maxillary sinusitis, recurrence not specified -- Take meds as prescribed - Use a cool mist humidifier  -Use saline nose sprays frequently -Saline irrigations of the nose can be very helpful if done frequently.  * 4X daily for 1 week*  * Use of a nettie pot can be helpful with this. Follow directions with this* -Force fluids -For any cough or congestion  Use plain Mucinex- regular strength or max strength is fine   * Children- consult with Pharmacist for dosing -For fever or aces or pains- take tylenol or ibuprofen appropriate for age and weight.  * for fevers greater than 101 orally you may alternate ibuprofen and tylenol every  3 hours. -Throat lozenges if helps  3. 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  Dispense: 14 tablet; Refill: 0  Jannifer Rodneyhristy Keen Ewalt, FNP

## 2015-05-11 NOTE — Patient Instructions (Signed)
Sinusitis, Adult Sinusitis is redness, soreness, and inflammation of the paranasal sinuses. Paranasal sinuses are air pockets within the bones of your face. They are located beneath your eyes, in the middle of your forehead, and above your eyes. In healthy paranasal sinuses, mucus is able to drain out, and air is able to circulate through them by way of your nose. However, when your paranasal sinuses are inflamed, mucus and air can become trapped. This can allow bacteria and other germs to grow and cause infection. Sinusitis can develop quickly and last only a short time (acute) or continue over a long period (chronic). Sinusitis that lasts for more than 12 weeks is considered chronic. CAUSES Causes of sinusitis include:  Allergies.  Structural abnormalities, such as displacement of the cartilage that separates your nostrils (deviated septum), which can decrease the air flow through your nose and sinuses and affect sinus drainage.  Functional abnormalities, such as when the small hairs (cilia) that line your sinuses and help remove mucus do not work properly or are not present. SIGNS AND SYMPTOMS Symptoms of acute and chronic sinusitis are the same. The primary symptoms are pain and pressure around the affected sinuses. Other symptoms include:  Upper toothache.  Earache.  Headache.  Bad breath.  Decreased sense of smell and taste.  A cough, which worsens when you are lying flat.  Fatigue.  Fever.  Thick drainage from your nose, which often is green and may contain pus (purulent).  Swelling and warmth over the affected sinuses. DIAGNOSIS Your health care provider will perform a physical exam. During your exam, your health care provider may perform any of the following to help determine if you have acute sinusitis or chronic sinusitis:  Look in your nose for signs of abnormal growths in your nostrils (nasal polyps).  Tap over the affected sinus to check for signs of  infection.  View the inside of your sinuses using an imaging device that has a light attached (endoscope). If your health care provider suspects that you have chronic sinusitis, one or more of the following tests may be recommended:  Allergy tests.  Nasal culture. A sample of mucus is taken from your nose, sent to a lab, and screened for bacteria.  Nasal cytology. A sample of mucus is taken from your nose and examined by your health care provider to determine if your sinusitis is related to an allergy. TREATMENT Most cases of acute sinusitis are related to a viral infection and will resolve on their own within 10 days. Sometimes, medicines are prescribed to help relieve symptoms of both acute and chronic sinusitis. These may include pain medicines, decongestants, nasal steroid sprays, or saline sprays. However, for sinusitis related to a bacterial infection, your health care provider will prescribe antibiotic medicines. These are medicines that will help kill the bacteria causing the infection. Rarely, sinusitis is caused by a fungal infection. In these cases, your health care provider will prescribe antifungal medicine. For some cases of chronic sinusitis, surgery is needed. Generally, these are cases in which sinusitis recurs more than 3 times per year, despite other treatments. HOME CARE INSTRUCTIONS  Drink plenty of water. Water helps thin the mucus so your sinuses can drain more easily.  Use a humidifier.  Inhale steam 3-4 times a day (for example, sit in the bathroom with the shower running).  Apply a warm, moist washcloth to your face 3-4 times a day, or as directed by your health care provider.  Use saline nasal sprays to help   moisten and clean your sinuses.  Take medicines only as directed by your health care provider.  If you were prescribed either an antibiotic or antifungal medicine, finish it all even if you start to feel better. SEEK IMMEDIATE MEDICAL CARE IF:  You have  increasing pain or severe headaches.  You have nausea, vomiting, or drowsiness.  You have swelling around your face.  You have vision problems.  You have a stiff neck.  You have difficulty breathing.   This information is not intended to replace advice given to you by your health care provider. Make sure you discuss any questions you have with your health care provider.   Document Released: 06/23/2005 Document Revised: 07/14/2014 Document Reviewed: 07/08/2011 Elsevier Interactive Patient Education 2016 [employer]  - Take meds as prescribed - Use a cool mist humidifier  -Use saline nose sprays frequently -Saline irrigations of the nose can be very helpful if done frequently.  * 4X daily for 1 week*  * Use of a nettie pot can be helpful with this. Follow directions with this* -Force fluids -For any cough or congestion  Use plain Mucinex- regular strength or max strength is fine   * Children- consult with Pharmacist for dosing -For fever or aces or pains- take tylenol or ibuprofen appropriate for age and weight.  * for fevers greater than 101 orally you may alternate ibuprofen and tylenol every  3 hours. -Throat lozenges if help   Nyomie Ehrlich, FNP   

## 2015-05-18 ENCOUNTER — Telehealth: Payer: Self-pay | Admitting: Family

## 2015-05-18 MED ORDER — LEVOFLOXACIN 500 MG PO TABS: 500.0000 mg | ORAL_TABLET | Freq: Every day | ORAL | Status: DC

## 2015-05-18 NOTE — Telephone Encounter (Signed)
Pt to stop Augmentin and start levaquin 500 mg daily

## 2015-05-18 NOTE — Telephone Encounter (Signed)
Left detialed message stating to stop current antibiotic and to start taking new one, call back with any further questions or concerns.

## 2015-05-22 DIAGNOSIS — M79671 Pain in right foot: Secondary | ICD-10-CM | POA: Diagnosis not present

## 2015-05-22 DIAGNOSIS — M2041 Other hammer toe(s) (acquired), right foot: Secondary | ICD-10-CM | POA: Diagnosis not present

## 2015-06-12 ENCOUNTER — Other Ambulatory Visit: Payer: Self-pay | Admitting: Family

## 2015-07-06 DIAGNOSIS — Z1231 Encounter for screening mammogram for malignant neoplasm of breast: Secondary | ICD-10-CM | POA: Diagnosis not present

## 2015-07-06 LAB — HM MAMMOGRAPHY: HM Mammogram: NEGATIVE

## 2015-07-11 DIAGNOSIS — H2513 Age-related nuclear cataract, bilateral: Secondary | ICD-10-CM | POA: Diagnosis not present

## 2015-07-11 DIAGNOSIS — H40033 Anatomical narrow angle, bilateral: Secondary | ICD-10-CM | POA: Diagnosis not present

## 2015-07-12 ENCOUNTER — Encounter: Payer: Self-pay | Admitting: *Deleted

## 2015-07-23 DIAGNOSIS — M7541 Impingement syndrome of right shoulder: Secondary | ICD-10-CM | POA: Diagnosis not present

## 2015-07-25 ENCOUNTER — Telehealth: Payer: Self-pay | Admitting: Family Medicine

## 2015-07-25 NOTE — Telephone Encounter (Signed)
PT needs to be seen

## 2015-07-25 NOTE — Telephone Encounter (Signed)
Pt is having sinus pressure, has been taken Flonase and sudafed without relief would like something called into pharmacy.

## 2015-07-25 NOTE — Telephone Encounter (Signed)
Pt given appt with Christy tomorrow at 10:40.

## 2015-07-26 ENCOUNTER — Ambulatory Visit (INDEPENDENT_AMBULATORY_CARE_PROVIDER_SITE_OTHER): Payer: Medicare Other | Admitting: Family

## 2015-07-26 ENCOUNTER — Encounter: Payer: Self-pay | Admitting: Family

## 2015-07-26 VITALS — BP 136/86 | HR 74 | Temp 97.0°F | Ht 66.0 in | Wt 184.6 lb

## 2015-07-26 DIAGNOSIS — J0100 Acute maxillary sinusitis, unspecified: Secondary | ICD-10-CM

## 2015-07-26 MED ORDER — AMOXICILLIN-POT CLAVULANATE 875-125 MG PO TABS: 1.0000 | ORAL_TABLET | Freq: Two times a day (BID) | ORAL | Status: DC

## 2015-07-26 NOTE — Patient Instructions (Signed)
Sinusitis, Adult Sinusitis is redness, soreness, and inflammation of the paranasal sinuses. Paranasal sinuses are air pockets within the bones of your face. They are located beneath your eyes, in the middle of your forehead, and above your eyes. In healthy paranasal sinuses, mucus is able to drain out, and air is able to circulate through them by way of your nose. However, when your paranasal sinuses are inflamed, mucus and air can become trapped. This can allow bacteria and other germs to grow and cause infection. Sinusitis can develop quickly and last only a short time (acute) or continue over a long period (chronic). Sinusitis that lasts for more than 12 weeks is considered chronic. CAUSES Causes of sinusitis include:  Allergies.  Structural abnormalities, such as displacement of the cartilage that separates your nostrils (deviated septum), which can decrease the air flow through your nose and sinuses and affect sinus drainage.  Functional abnormalities, such as when the small hairs (cilia) that line your sinuses and help remove mucus do not work properly or are not present. SIGNS AND SYMPTOMS Symptoms of acute and chronic sinusitis are the same. The primary symptoms are pain and pressure around the affected sinuses. Other symptoms include:  Upper toothache.  Earache.  Headache.  Bad breath.  Decreased sense of smell and taste.  A cough, which worsens when you are lying flat.  Fatigue.  Fever.  Thick drainage from your nose, which often is green and may contain pus (purulent).  Swelling and warmth over the affected sinuses. DIAGNOSIS Your health care provider will perform a physical exam. During your exam, your health care provider may perform any of the following to help determine if you have acute sinusitis or chronic sinusitis:  Look in your nose for signs of abnormal growths in your nostrils (nasal polyps).  Tap over the affected sinus to check for signs of  infection.  View the inside of your sinuses using an imaging device that has a light attached (endoscope). If your health care provider suspects that you have chronic sinusitis, one or more of the following tests may be recommended:  Allergy tests.  Nasal culture. A sample of mucus is taken from your nose, sent to a lab, and screened for bacteria.  Nasal cytology. A sample of mucus is taken from your nose and examined by your health care provider to determine if your sinusitis is related to an allergy. TREATMENT Most cases of acute sinusitis are related to a viral infection and will resolve on their own within 10 days. Sometimes, medicines are prescribed to help relieve symptoms of both acute and chronic sinusitis. These may include pain medicines, decongestants, nasal steroid sprays, or saline sprays. However, for sinusitis related to a bacterial infection, your health care provider will prescribe antibiotic medicines. These are medicines that will help kill the bacteria causing the infection. Rarely, sinusitis is caused by a fungal infection. In these cases, your health care provider will prescribe antifungal medicine. For some cases of chronic sinusitis, surgery is needed. Generally, these are cases in which sinusitis recurs more than 3 times per year, despite other treatments. HOME CARE INSTRUCTIONS  Drink plenty of water. Water helps thin the mucus so your sinuses can drain more easily.  Use a humidifier.  Inhale steam 3-4 times a day (for example, sit in the bathroom with the shower running).  Apply a warm, moist washcloth to your face 3-4 times a day, or as directed by your health care provider.  Use saline nasal sprays to help   moisten and clean your sinuses.  Take medicines only as directed by your health care provider.  If you were prescribed either an antibiotic or antifungal medicine, finish it all even if you start to feel better. SEEK IMMEDIATE MEDICAL CARE IF:  You have  increasing pain or severe headaches.  You have nausea, vomiting, or drowsiness.  You have swelling around your face.  You have vision problems.  You have a stiff neck.  You have difficulty breathing.   This information is not intended to replace advice given to you by your health care provider. Make sure you discuss any questions you have with your health care provider.   Document Released: 06/23/2005 Document Revised: 07/14/2014 Document Reviewed: 07/08/2011 Elsevier Interactive Patient Education 2016 [employer]  - Take meds as prescribed - Use a cool mist humidifier  -Use saline nose sprays frequently -Saline irrigations of the nose can be very helpful if done frequently.  * 4X daily for 1 week*  * Use of a nettie pot can be helpful with this. Follow directions with this* -Force fluids -For any cough or congestion  Use plain Mucinex- regular strength or max strength is fine   * Children- consult with Pharmacist for dosing -For fever or aces or pains- take tylenol or ibuprofen appropriate for age and weight.  * for fevers greater than 101 orally you may alternate ibuprofen and tylenol every  3 hours. -Throat lozenges if help   Tamasha Laplante, FNP   

## 2015-07-26 NOTE — Progress Notes (Signed)
   Subjective:    Patient ID: Abigail Moss, female    DOB: 05-Jan-1946, 70 y.o.   MRN: [id number]  Sinus Problem This is a new problem. The current episode started in the past 7 days. The problem has been gradually worsening since onset. There has been no fever. Her pain is at a severity of 4/10. Associated symptoms include congestion, coughing, ear pain, headaches, a hoarse voice, sinus pressure and sneezing. Pertinent negatives include no chills, neck pain, shortness of breath or sore throat. Past treatments include oral decongestants, spray decongestants and sitting up. The treatment provided mild relief.      Review of Systems  Constitutional: Negative.  Negative for chills.  HENT: Positive for congestion, ear pain, hoarse voice, sinus pressure and sneezing. Negative for sore throat.   Eyes: Negative.   Respiratory: Positive for cough. Negative for shortness of breath.   Cardiovascular: Negative.  Negative for palpitations.  Gastrointestinal: Negative.   Endocrine: Negative.   Genitourinary: Negative.   Musculoskeletal: Negative.  Negative for neck pain.  Neurological: Positive for headaches.  Hematological: Negative.   Psychiatric/Behavioral: Negative.   All other systems reviewed and are negative.      Objective:   Physical Exam  Constitutional: She is oriented to person, place, and time. She appears well-developed and well-nourished. No distress.  HENT:  Head: Normocephalic and atraumatic.  Right Ear: External ear normal.  Left Ear: External ear normal.  Nose: Right sinus exhibits maxillary sinus tenderness and frontal sinus tenderness. Left sinus exhibits maxillary sinus tenderness and frontal sinus tenderness.  Nasal passage erythemas with mild swelling  Oropharynx erythemas  Eyes: Pupils are equal, round, and reactive to light.  Neck: Normal range of motion. Neck supple. No thyromegaly present.  Cardiovascular: Normal rate, regular rhythm, normal heart sounds and  intact distal pulses.   No murmur heard. Pulmonary/Chest: Effort normal and breath sounds normal. No respiratory distress. She has no wheezes.  Abdominal: Soft. Bowel sounds are normal. She exhibits no distension. There is no tenderness.  Musculoskeletal: Normal range of motion. She exhibits no edema or tenderness.  Neurological: She is alert and oriented to person, place, and time. She has normal reflexes. No cranial nerve deficit.  Skin: Skin is warm and dry.  Psychiatric: She has a normal mood and affect. Her behavior is normal. Judgment and thought content normal.  Vitals reviewed.     BP 136/86 mmHg  Pulse 74  Temp(Src) 97 F (36.1 C) (Oral)  Ht  (1.676 m)  Wt 184 lb 9.6 oz (83.734 kg)  BMI 29.81 kg/m2     Assessment & Plan:  1. Acute maxillary sinusitis, recurrence not specified -- Take meds as prescribed - Use a cool mist humidifier  -Use saline nose sprays frequently -Saline irrigations of the nose can be very helpful if done frequently.  * 4X daily for 1 week*  * Use of a nettie pot can be helpful with this. Follow directions with this* -Force fluids -For any cough or congestion  Use plain Mucinex- regular strength or max strength is fine   * Children- consult with Pharmacist for dosing -For fever or aces or pains- take tylenol or ibuprofen appropriate for age and weight.  * for fevers greater than 101 orally you may alternate ibuprofen and tylenol every  3 hours. -Throat lozenges if help - amoxicillin-clavulanate (AUGMENTIN) 875-125 MG tablet; Take 1 tablet by mouth 2 (two) times daily.  Dispense: 14 tablet; Refill: 0  Jannifer Rodney, FNP

## 2015-07-30 ENCOUNTER — Telehealth: Payer: Self-pay | Admitting: Family

## 2015-07-30 MED ORDER — LEVOFLOXACIN 500 MG PO TABS: 500.0000 mg | ORAL_TABLET | Freq: Every day | ORAL | Status: DC

## 2015-07-30 NOTE — Telephone Encounter (Signed)
Pt aware.

## 2015-07-30 NOTE — Telephone Encounter (Signed)
Pt to stop Augmentin and start Levaquin. Continue mucinex

## 2015-08-14 ENCOUNTER — Encounter: Payer: Self-pay | Admitting: Family

## 2015-08-14 ENCOUNTER — Ambulatory Visit (INDEPENDENT_AMBULATORY_CARE_PROVIDER_SITE_OTHER): Payer: Medicare Other | Admitting: Family

## 2015-08-14 VITALS — BP 134/89 | HR 73 | Temp 97.8°F | Ht 66.0 in | Wt 187.2 lb

## 2015-08-14 DIAGNOSIS — M79605 Pain in left leg: Secondary | ICD-10-CM

## 2015-08-14 DIAGNOSIS — J0100 Acute maxillary sinusitis, unspecified: Secondary | ICD-10-CM | POA: Diagnosis not present

## 2015-08-14 DIAGNOSIS — M79604 Pain in right leg: Secondary | ICD-10-CM | POA: Diagnosis not present

## 2015-08-14 MED ORDER — METHYLPREDNISOLONE ACETATE 80 MG/ML IJ SUSP
80.0000 mg | Freq: Once | INTRAMUSCULAR | Status: AC
  Administered 2015-08-14: 80 mg via INTRAMUSCULAR

## 2015-08-14 NOTE — Progress Notes (Signed)
Subjective:    Patient ID: SHAKIRA LOS, female    DOB: Jun 23, 1946, 70 y.o.   MRN: [id number]  Leg Pain  The incident occurred more than 1 week ago. There was no injury mechanism. The pain is present in the left leg and right leg. The quality of the pain is described as aching. The pain is at a severity of 4/10. The pain is mild. The pain has been fluctuating since onset. Pertinent negatives include no loss of motion, numbness or tingling. She reports no foreign bodies present. The symptoms are aggravated by movement. She has tried acetaminophen and NSAIDs for the symptoms. The treatment provided mild relief.  Sinus Problem This is a recurrent problem. The current episode started in the past 7 days. There has been no fever. The pain is mild. Associated symptoms include congestion, ear pain and sinus pressure. Pertinent negatives include no coughing, headaches, hoarse voice, shortness of breath, sneezing or sore throat. Past treatments include acetaminophen. The treatment provided mild relief.      Review of Systems  Constitutional: Negative.   HENT: Positive for congestion, ear pain and sinus pressure. Negative for hoarse voice, sneezing and sore throat.   Eyes: Negative.   Respiratory: Negative.  Negative for cough and shortness of breath.   Cardiovascular: Negative.  Negative for palpitations.  Gastrointestinal: Negative.   Endocrine: Negative.   Genitourinary: Negative.   Musculoskeletal: Negative.   Neurological: Negative.  Negative for tingling, numbness and headaches.  Hematological: Negative.   Psychiatric/Behavioral: Negative.   All other systems reviewed and are negative.      Objective:   Physical Exam  Constitutional: She is oriented to person, place, and time. She appears well-developed and well-nourished. No distress.  HENT:  Head: Normocephalic and atraumatic.  Right Ear: External ear normal.  Left Ear: External ear normal.  Nose: Right sinus exhibits maxillary  sinus tenderness. Right sinus exhibits no frontal sinus tenderness. Left sinus exhibits maxillary sinus tenderness. Left sinus exhibits no frontal sinus tenderness.  Oropharynx erythemas   Eyes: Pupils are equal, round, and reactive to light.  Neck: Normal range of motion. Neck supple. No thyromegaly present.  Cardiovascular: Normal rate, regular rhythm, normal heart sounds and intact distal pulses.   No murmur heard. Pulmonary/Chest: Effort normal and breath sounds normal. No respiratory distress. She has no wheezes.  Abdominal: Soft. Bowel sounds are normal. She exhibits no distension. There is no tenderness.  Musculoskeletal: Normal range of motion. She exhibits no edema or tenderness.  Neurological: She is alert and oriented to person, place, and time. She has normal reflexes. No cranial nerve deficit.  Skin: Skin is warm and dry.  Psychiatric: She has a normal mood and affect. Her behavior is normal. Judgment and thought content normal.  Vitals reviewed.   BP 134/89 mmHg  Pulse 73  Temp(Src) 97.8 F (36.6 C) (Oral)  Ht  (1.676 m)  Wt 187 lb 3.2 oz (84.913 kg)  BMI 30.23 kg/m2       Assessment & Plan:  1. Bilateral leg pain -Rest -Encourage ROM and stretching - methylPREDNISolone acetate (DEPO-MEDROL) injection 80 mg; Inject 1 mL (80 mg total) into the muscle once.  2. Acute maxillary sinusitis, recurrence not specified -- Take meds as prescribed - Use a cool mist humidifier  -Use saline nose sprays frequently -Saline irrigations of the nose can be very helpful if done frequently.  * 4X daily for 1 week*  * Use of a nettie pot can be helpful with  this. Follow directions with this* -Force fluids -For any cough or congestion  Use plain Mucinex- regular strength or max strength is fine   * Children- consult with Pharmacist for dosing -For fever or aces or pains- take tylenol or ibuprofen appropriate for age and weight.  * for fevers greater than 101 orally you may  alternate ibuprofen and tylenol every  3 hours. -Throat lozenges if help - methylPREDNISolone acetate (DEPO-MEDROL) injection 80 mg; Inject 1 mL (80 mg total) into the muscle once.  Jannifer Rodney, FNP

## 2015-08-14 NOTE — Patient Instructions (Signed)
Sinusitis, Adult Sinusitis is redness, soreness, and inflammation of the paranasal sinuses. Paranasal sinuses are air pockets within the bones of your face. They are located beneath your eyes, in the middle of your forehead, and above your eyes. In healthy paranasal sinuses, mucus is able to drain out, and air is able to circulate through them by way of your nose. However, when your paranasal sinuses are inflamed, mucus and air can become trapped. This can allow bacteria and other germs to grow and cause infection. Sinusitis can develop quickly and last only a short time (acute) or continue over a long period (chronic). Sinusitis that lasts for more than 12 weeks is considered chronic. CAUSES Causes of sinusitis include:  Allergies.  Structural abnormalities, such as displacement of the cartilage that separates your nostrils (deviated septum), which can decrease the air flow through your nose and sinuses and affect sinus drainage.  Functional abnormalities, such as when the small hairs (cilia) that line your sinuses and help remove mucus do not work properly or are not present. SIGNS AND SYMPTOMS Symptoms of acute and chronic sinusitis are the same. The primary symptoms are pain and pressure around the affected sinuses. Other symptoms include:  Upper toothache.  Earache.  Headache.  Bad breath.  Decreased sense of smell and taste.  A cough, which worsens when you are lying flat.  Fatigue.  Fever.  Thick drainage from your nose, which often is green and may contain pus (purulent).  Swelling and warmth over the affected sinuses. DIAGNOSIS Your health care provider will perform a physical exam. During your exam, your health care provider may perform any of the following to help determine if you have acute sinusitis or chronic sinusitis:  Look in your nose for signs of abnormal growths in your nostrils (nasal polyps).  Tap over the affected sinus to check for signs of  infection.  View the inside of your sinuses using an imaging device that has a light attached (endoscope). If your health care provider suspects that you have chronic sinusitis, one or more of the following tests may be recommended:  Allergy tests.  Nasal culture. A sample of mucus is taken from your nose, sent to a lab, and screened for bacteria.  Nasal cytology. A sample of mucus is taken from your nose and examined by your health care provider to determine if your sinusitis is related to an allergy. TREATMENT Most cases of acute sinusitis are related to a viral infection and will resolve on their own within 10 days. Sometimes, medicines are prescribed to help relieve symptoms of both acute and chronic sinusitis. These may include pain medicines, decongestants, nasal steroid sprays, or saline sprays. However, for sinusitis related to a bacterial infection, your health care provider will prescribe antibiotic medicines. These are medicines that will help kill the bacteria causing the infection. Rarely, sinusitis is caused by a fungal infection. In these cases, your health care provider will prescribe antifungal medicine. For some cases of chronic sinusitis, surgery is needed. Generally, these are cases in which sinusitis recurs more than 3 times per year, despite other treatments. HOME CARE INSTRUCTIONS  Drink plenty of water. Water helps thin the mucus so your sinuses can drain more easily.  Use a humidifier.  Inhale steam 3-4 times a day (for example, sit in the bathroom with the shower running).  Apply a warm, moist washcloth to your face 3-4 times a day, or as directed by your health care provider.  Use saline nasal sprays to help   moisten and clean your sinuses.  Take medicines only as directed by your health care provider.  If you were prescribed either an antibiotic or antifungal medicine, finish it all even if you start to feel better. SEEK IMMEDIATE MEDICAL CARE IF:  You have  increasing pain or severe headaches.  You have nausea, vomiting, or drowsiness.  You have swelling around your face.  You have vision problems.  You have a stiff neck.  You have difficulty breathing.   This information is not intended to replace advice given to you by your health care provider. Make sure you discuss any questions you have with your health care provider.   Document Released: 06/23/2005 Document Revised: 07/14/2014 Document Reviewed: 07/08/2011 Elsevier Interactive Patient Education 2016 [employer]  - Take meds as prescribed - Use a cool mist humidifier  -Use saline nose sprays frequently -Saline irrigations of the nose can be very helpful if done frequently.  * 4X daily for 1 week*  * Use of a nettie pot can be helpful with this. Follow directions with this* -Force fluids -For any cough or congestion  Use plain Mucinex- regular strength or max strength is fine   * Children- consult with Pharmacist for dosing -For fever or aces or pains- take tylenol or ibuprofen appropriate for age and weight.  * for fevers greater than 101 orally you may alternate ibuprofen and tylenol every  3 hours. -Throat lozenges if help   Abigail Chrisley, FNP   

## 2015-09-09 ENCOUNTER — Other Ambulatory Visit: Payer: Self-pay | Admitting: Family

## 2015-09-19 ENCOUNTER — Ambulatory Visit: Payer: Medicare Other | Admitting: Family Medicine

## 2015-09-20 ENCOUNTER — Ambulatory Visit: Payer: Medicare Other | Admitting: Family

## 2015-09-20 ENCOUNTER — Encounter: Payer: Self-pay | Admitting: Family

## 2015-09-20 ENCOUNTER — Ambulatory Visit (INDEPENDENT_AMBULATORY_CARE_PROVIDER_SITE_OTHER): Payer: Medicare Other | Admitting: Family

## 2015-09-20 VITALS — BP 138/89 | HR 72 | Temp 97.1°F | Ht 66.0 in | Wt 184.0 lb

## 2015-09-20 DIAGNOSIS — J309 Allergic rhinitis, unspecified: Secondary | ICD-10-CM

## 2015-09-20 DIAGNOSIS — J069 Acute upper respiratory infection, unspecified: Secondary | ICD-10-CM | POA: Diagnosis not present

## 2015-09-20 DIAGNOSIS — R062 Wheezing: Secondary | ICD-10-CM

## 2015-09-20 MED ORDER — ALBUTEROL SULFATE HFA 108 (90 BASE) MCG/ACT IN AERS: 2.0000 | INHALATION_SPRAY | Freq: Four times a day (QID) | RESPIRATORY_TRACT | Status: DC | PRN

## 2015-09-20 NOTE — Progress Notes (Signed)
Subjective:    Patient ID: Abigail Moss, female    DOB: 03-Aug-1945, 70 y.o.   MRN: [id number]  Cough This is a new problem. The current episode started in the past 7 days. The problem has been unchanged. The problem occurs every few minutes. The cough is non-productive. Associated symptoms include chills, ear pain, nasal congestion, postnasal drip and wheezing. Pertinent negatives include no fever, headaches, myalgias, rhinorrhea, sore throat or shortness of breath. The symptoms are aggravated by lying down. She has tried OTC cough suppressant for the symptoms. The treatment provided mild relief. Her past medical history is significant for asthma. There is no history of COPD.      Review of Systems  Constitutional: Positive for chills. Negative for fever.  HENT: Positive for ear pain and postnasal drip. Negative for rhinorrhea and sore throat.   Eyes: Negative.   Respiratory: Positive for cough and wheezing. Negative for shortness of breath.   Cardiovascular: Negative.  Negative for palpitations.  Gastrointestinal: Negative.   Endocrine: Negative.   Genitourinary: Negative.   Musculoskeletal: Negative.  Negative for myalgias.  Neurological: Negative.  Negative for headaches.  Hematological: Negative.   Psychiatric/Behavioral: Negative.   All other systems reviewed and are negative.      Objective:   Physical Exam  Constitutional: She is oriented to person, place, and time. She appears well-developed and well-nourished. No distress.  HENT:  Head: Normocephalic and atraumatic.  Right Ear: External ear normal.  Left Ear: External ear normal.  Nasal passage erythemas with mild swelling  Oropharynx erythemas   Eyes: Pupils are equal, round, and reactive to light.  Neck: Normal range of motion. Neck supple. No thyromegaly present.  Cardiovascular: Normal rate, regular rhythm, normal heart sounds and intact distal pulses.   No murmur heard. Pulmonary/Chest: Effort normal and  breath sounds normal. No respiratory distress. She has no wheezes.  Tight cough   Abdominal: Soft. Bowel sounds are normal. She exhibits no distension. There is no tenderness.  Musculoskeletal: Normal range of motion. She exhibits no edema or tenderness.  Neurological: She is alert and oriented to person, place, and time. She has normal reflexes. No cranial nerve deficit.  Skin: Skin is warm and dry.  Psychiatric: She has a normal mood and affect. Her behavior is normal. Judgment and thought content normal.  Vitals reviewed.     BP 138/89 mmHg  Pulse 72  Temp(Src) 97.1 F (36.2 C) (Oral)  Ht 5\' 6"  (1.676 m)  Wt 184 lb (83.462 kg)  BMI 29.71 kg/m2     Assessment & Plan:  1. Allergic rhinitis, unspecified allergic rhinitis type - albuterol (PROVENTIL HFA;VENTOLIN HFA) 108 (90 Base) MCG/ACT inhaler; Inhale 2 puffs into the lungs every 6 (six) hours as needed for wheezing or shortness of breath.  Dispense: 1 Inhaler; Refill: 2  2. Wheezing - albuterol (PROVENTIL HFA;VENTOLIN HFA) 108 (90 Base) MCG/ACT inhaler; Inhale 2 puffs into the lungs every 6 (six) hours as needed for wheezing or shortness of breath.  Dispense: 1 Inhaler; Refill: 2  3. Acute upper respiratory infection -- Take meds as prescribed - Use a cool mist humidifier  -Use saline nose sprays frequently -Saline irrigations of the nose can be very helpful if done frequently.  * 4X daily for 1 week*  * Use of a nettie pot can be helpful with this. Follow directions with this* -Force fluids -For any cough or congestion  Use plain Mucinex- regular strength or max strength is fine   *  Children- consult with Pharmacist for dosing -For fever or aces or pains- take tylenol or ibuprofen appropriate for age and weight.  * for fevers greater than 101 orally you may alternate ibuprofen and tylenol every  3 hours. -Throat lozenges if help  Jannifer Rodney, FNP

## 2015-09-20 NOTE — Patient Instructions (Signed)
Upper Respiratory Infection, Adult Most upper respiratory infections (URIs) are a viral infection of the air passages leading to the lungs. A URI affects the nose, throat, and upper air passages. The most common type of URI is nasopharyngitis and is typically referred to as "the common cold." URIs run their course and usually go away on their own. Most of the time, a URI does not require medical attention, but sometimes a bacterial infection in the upper airways can follow a viral infection. This is called a secondary infection. Sinus and middle ear infections are common types of secondary upper respiratory infections. Bacterial pneumonia can also complicate a URI. A URI can worsen asthma and chronic obstructive pulmonary disease (COPD). Sometimes, these complications can require emergency medical care and may be life threatening.  CAUSES Almost all URIs are caused by viruses. A virus is a type of germ and can spread from one person to another.  RISKS FACTORS You may be at risk for a URI if:   You smoke.   You have chronic heart or lung disease.  You have a weakened defense (immune) system.   You are very young or very old.   You have nasal allergies or asthma.  You work in crowded or poorly ventilated areas.  You work in health care facilities or schools. SIGNS AND SYMPTOMS  Symptoms typically develop 2-3 days after you come in contact with a cold virus. Most viral URIs last 7-10 days. However, viral URIs from the influenza virus (flu virus) can last 14-18 days and are typically more severe. Symptoms may include:   Runny or stuffy (congested) nose.   Sneezing.   Cough.   Sore throat.   Headache.   Fatigue.   Fever.   Loss of appetite.   Pain in your forehead, behind your eyes, and over your cheekbones (sinus pain).  Muscle aches.  DIAGNOSIS  Your health care provider may diagnose a URI by:  Physical exam.  Tests to check that your symptoms are not due to  another condition such as:  Strep throat.  Sinusitis.  Pneumonia.  Asthma. TREATMENT  A URI goes away on its own with time. It cannot be cured with medicines, but medicines may be prescribed or recommended to relieve symptoms. Medicines may help:  Reduce your fever.  Reduce your cough.  Relieve nasal congestion. HOME CARE INSTRUCTIONS   Take medicines only as directed by your health care provider.   Gargle warm saltwater or take cough drops to comfort your throat as directed by your health care provider.  Use a warm mist humidifier or inhale steam from a shower to increase air moisture. This may make it easier to breathe.  Drink enough fluid to keep your urine clear or pale yellow.   Eat soups and other clear broths and maintain good nutrition.   Rest as needed.   Return to work when your temperature has returned to normal or as your health care provider advises. You may need to stay home longer to avoid infecting others. You can also use a face mask and careful hand washing to prevent spread of the virus.  Increase the usage of your inhaler if you have asthma.   Do not use any tobacco products, including cigarettes, chewing tobacco, or electronic cigarettes. If you need help quitting, ask your health care provider. PREVENTION  The best way to protect yourself from getting a cold is to practice good hygiene.   Avoid oral or hand contact with people with cold   symptoms.   Wash your hands often if contact occurs.  There is no clear evidence that vitamin C, vitamin E, echinacea, or exercise reduces the chance of developing a cold. However, it is always recommended to get plenty of rest, exercise, and practice good nutrition.  SEEK MEDICAL CARE IF:   You are getting worse rather than better.   Your symptoms are not controlled by medicine.   You have chills.  You have worsening shortness of breath.  You have brown or red mucus.  You have yellow or brown nasal  discharge.  You have pain in your face, especially when you bend forward.  You have a fever.  You have swollen neck glands.  You have pain while swallowing.  You have white areas in the back of your throat. SEEK IMMEDIATE MEDICAL CARE IF:   You have severe or persistent:  Headache.  Ear pain.  Sinus pain.  Chest pain.  You have chronic lung disease and any of the following:  Wheezing.  Prolonged cough.  Coughing up blood.  A change in your usual mucus.  You have a stiff neck.  You have changes in your:  Vision.  Hearing.  Thinking.  Mood. MAKE SURE YOU:   Understand these instructions.  Will watch your condition.  Will get help right away if you are not doing well or get worse.   This information is not intended to replace advice given to you by your health care provider. Make sure you discuss any questions you have with your health care provider.   Document Released: 12/17/2000 Document Revised: 11/07/2014 Document Reviewed: 09/28/2013 Elsevier Interactive Patient Education 2016 [employer]  

## 2015-09-24 ENCOUNTER — Telehealth: Payer: Self-pay | Admitting: Family

## 2015-09-24 MED ORDER — LEVOFLOXACIN 500 MG PO TABS: 500.0000 mg | ORAL_TABLET | Freq: Every day | ORAL | Status: DC

## 2015-09-24 NOTE — Telephone Encounter (Signed)
Left detailed message that rx was sent to pharmacy and to CB with any further questions or concerns. 

## 2015-09-24 NOTE — Telephone Encounter (Signed)
Prescription sent to pharmacy.

## 2015-10-30 ENCOUNTER — Other Ambulatory Visit: Payer: Self-pay | Admitting: Family

## 2015-10-31 NOTE — Telephone Encounter (Signed)
Last seen 09/21/15  Abigail Moss  Dr Darlyn ReadStacks  PCP   Last thyroid 11/21/14  Requesting 90 day supply

## 2015-10-31 NOTE — Telephone Encounter (Signed)
Authorize 30 days only. Then contact the patient letting them know that they will need an appointment before any further prescriptions can be sent in. 

## 2015-11-27 ENCOUNTER — Encounter: Payer: Medicare Other | Admitting: Family

## 2015-12-01 ENCOUNTER — Other Ambulatory Visit: Payer: Self-pay | Admitting: Family Medicine

## 2015-12-04 NOTE — Telephone Encounter (Signed)
Authorize 30 days only. Then contact the patient letting them know that they will need an appointment before any further prescriptions can be sent in. 

## 2015-12-04 NOTE — Telephone Encounter (Signed)
Last labs 11/21/2014. Please review

## 2015-12-11 ENCOUNTER — Encounter: Payer: Medicare Other | Admitting: Family

## 2015-12-18 ENCOUNTER — Encounter: Payer: Self-pay | Admitting: Family

## 2015-12-18 ENCOUNTER — Ambulatory Visit (INDEPENDENT_AMBULATORY_CARE_PROVIDER_SITE_OTHER): Payer: Medicare Other | Admitting: Family

## 2015-12-18 VITALS — BP 136/86 | HR 70 | Temp 98.0°F | Ht 66.0 in | Wt 184.0 lb

## 2015-12-18 DIAGNOSIS — I10 Essential (primary) hypertension: Secondary | ICD-10-CM

## 2015-12-18 DIAGNOSIS — E039 Hypothyroidism, unspecified: Secondary | ICD-10-CM

## 2015-12-18 DIAGNOSIS — Z1159 Encounter for screening for other viral diseases: Secondary | ICD-10-CM | POA: Diagnosis not present

## 2015-12-18 DIAGNOSIS — E559 Vitamin D deficiency, unspecified: Secondary | ICD-10-CM | POA: Diagnosis not present

## 2015-12-18 DIAGNOSIS — M810 Age-related osteoporosis without current pathological fracture: Secondary | ICD-10-CM | POA: Diagnosis not present

## 2015-12-18 DIAGNOSIS — E663 Overweight: Secondary | ICD-10-CM | POA: Diagnosis not present

## 2015-12-18 DIAGNOSIS — Z713 Dietary counseling and surveillance: Secondary | ICD-10-CM | POA: Diagnosis not present

## 2015-12-18 DIAGNOSIS — K219 Gastro-esophageal reflux disease without esophagitis: Secondary | ICD-10-CM

## 2015-12-18 DIAGNOSIS — F411 Generalized anxiety disorder: Secondary | ICD-10-CM | POA: Insufficient documentation

## 2015-12-18 DIAGNOSIS — J309 Allergic rhinitis, unspecified: Secondary | ICD-10-CM | POA: Diagnosis not present

## 2015-12-18 DIAGNOSIS — E669 Obesity, unspecified: Secondary | ICD-10-CM | POA: Insufficient documentation

## 2015-12-18 MED ORDER — OMEPRAZOLE 40 MG PO CPDR: 40.0000 mg | DELAYED_RELEASE_CAPSULE | Freq: Every day | ORAL | Status: DC

## 2015-12-18 MED ORDER — MONTELUKAST SODIUM 10 MG PO TABS: 10.0000 mg | ORAL_TABLET | Freq: Every day | ORAL | Status: DC

## 2015-12-18 MED ORDER — ALENDRONATE SODIUM 70 MG PO TABS: 70.0000 mg | ORAL_TABLET | ORAL | Status: DC

## 2015-12-18 MED ORDER — PHENTERMINE HCL 37.5 MG PO CAPS: 37.5000 mg | ORAL_CAPSULE | ORAL | Status: DC

## 2015-12-18 MED ORDER — ALPRAZOLAM 0.5 MG PO TABS: 0.5000 mg | ORAL_TABLET | Freq: Every evening | ORAL | Status: DC | PRN

## 2015-12-18 MED ORDER — BUPROPION HCL ER (XL) 300 MG PO TB24: ORAL_TABLET | ORAL | Status: DC

## 2015-12-18 MED ORDER — LEVOTHYROXINE SODIUM 112 MCG PO TABS: ORAL_TABLET | ORAL | Status: DC

## 2015-12-18 NOTE — Patient Instructions (Signed)
Health Maintenance, Female Adopting a healthy lifestyle and getting preventive care can go a long way to promote health and wellness. Talk with your health care provider about what schedule of regular examinations is right for you. This is a good chance for you to check in with your provider about disease prevention and staying healthy. In between checkups, there are plenty of things you can do on your own. Experts have done a lot of research about which lifestyle changes and preventive measures are most likely to keep you healthy. Ask your health care provider for more information. WEIGHT AND DIET  Eat a healthy diet  Be sure to include plenty of vegetables, fruits, low-fat dairy products, and lean protein.  Do not eat a lot of foods high in solid fats, added sugars, or salt.  Get regular exercise. This is one of the most important things you can do for your health.  Most adults should exercise for at least 150 minutes each week. The exercise should increase your heart rate and make you sweat (moderate-intensity exercise).  Most adults should also do strengthening exercises at least twice a week. This is in addition to the moderate-intensity exercise.  Maintain a healthy weight  Body mass index (BMI) is a measurement that can be used to identify possible weight problems. It estimates body fat based on height and weight. Your health care provider can help determine your BMI and help you achieve or maintain a healthy weight.  For females 20 years of age and older:   A BMI below 18.5 is considered underweight.  A BMI of 18.5 to 24.9 is normal.  A BMI of 25 to 29.9 is considered overweight.  A BMI of 30 and above is considered obese.  Watch levels of cholesterol and blood lipids  You should start having your blood tested for lipids and cholesterol at 72 years of age, then have this test every 5 years.  You may need to have your cholesterol levels checked more often if:  Your lipid  or cholesterol levels are high.  You are older than 72 years of age.  You are at high risk for heart disease.  CANCER SCREENING   Lung Cancer  Lung cancer screening is recommended for adults 55-80 years old who are at high risk for lung cancer because of a history of smoking.  A yearly low-dose CT scan of the lungs is recommended for people who:  Currently smoke.  Have quit within the past 15 years.  Have at least a 30-pack-year history of smoking. A pack year is smoking an average of one pack of cigarettes a day for 1 year.  Yearly screening should continue until it has been 15 years since you quit.  Yearly screening should stop if you develop a health problem that would prevent you from having lung cancer treatment.  Breast Cancer  Practice breast self-awareness. This means understanding how your breasts normally appear and feel.  It also means doing regular breast self-exams. Let your health care provider know about any changes, no matter how small.  If you are in your 20s or 30s, you should have a clinical breast exam (CBE) by a health care provider every 1-3 years as part of a regular health exam.  If you are 40 or older, have a CBE every year. Also consider having a breast X-ray (mammogram) every year.  If you have a family history of breast cancer, talk to your health care provider about genetic screening.  If you   are at high risk for breast cancer, talk to your health care provider about having an MRI and a mammogram every year.  Breast cancer gene (BRCA) assessment is recommended for women who have family members with BRCA-related cancers. BRCA-related cancers include:  Breast.  Ovarian.  Tubal.  Peritoneal cancers.  Results of the assessment will determine the need for genetic counseling and BRCA1 and BRCA2 testing. Cervical Cancer Your health care provider may recommend that you be screened regularly for cancer of the pelvic organs (ovaries, uterus, and  vagina). This screening involves a pelvic examination, including checking for microscopic changes to the surface of your cervix (Pap test). You may be encouraged to have this screening done every 3 years, beginning at age 21.  For women ages 30-65, health care providers may recommend pelvic exams and Pap testing every 3 years, or they may recommend the Pap and pelvic exam, combined with testing for human papilloma virus (HPV), every 5 years. Some types of HPV increase your risk of cervical cancer. Testing for HPV may also be done on women of any age with unclear Pap test results.  Other health care providers may not recommend any screening for nonpregnant women who are considered low risk for pelvic cancer and who do not have symptoms. Ask your health care provider if a screening pelvic exam is right for you.  If you have had past treatment for cervical cancer or a condition that could lead to cancer, you need Pap tests and screening for cancer for at least 20 years after your treatment. If Pap tests have been discontinued, your risk factors (such as having a new sexual partner) need to be reassessed to determine if screening should resume. Some women have medical problems that increase the chance of getting cervical cancer. In these cases, your health care provider may recommend more frequent screening and Pap tests. Colorectal Cancer  This type of cancer can be detected and often prevented.  Routine colorectal cancer screening usually begins at 72 years of age and continues through 72 years of age.  Your health care provider may recommend screening at an earlier age if you have risk factors for colon cancer.  Your health care provider may also recommend using home test kits to check for hidden blood in the stool.  A small camera at the end of a tube can be used to examine your colon directly (sigmoidoscopy or colonoscopy). This is done to check for the earliest forms of colorectal  cancer.  Routine screening usually begins at age 50.  Direct examination of the colon should be repeated every 5-10 years through 72 years of age. However, you may need to be screened more often if early forms of precancerous polyps or small growths are found. Skin Cancer  Check your skin from head to toe regularly.  Tell your health care provider about any new moles or changes in moles, especially if there is a change in a mole's shape or color.  Also tell your health care provider if you have a mole that is larger than the size of a pencil eraser.  Always use sunscreen. Apply sunscreen liberally and repeatedly throughout the day.  Protect yourself by wearing long sleeves, pants, a wide-brimmed hat, and sunglasses whenever you are outside. HEART DISEASE, DIABETES, AND HIGH BLOOD PRESSURE   High blood pressure causes heart disease and increases the risk of stroke. High blood pressure is more likely to develop in:  People who have blood pressure in the high end   of the normal range (130-139/85-89 mm Hg).  People who are overweight or obese.  People who are African American.  If you are 38-23 years of age, have your blood pressure checked every 3-5 years. If you are 61 years of age or older, have your blood pressure checked every year. You should have your blood pressure measured twice--once when you are at a hospital or clinic, and once when you are not at a hospital or clinic. Record the average of the two measurements. To check your blood pressure when you are not at a hospital or clinic, you can use:  An automated blood pressure machine at a pharmacy.  A home blood pressure monitor.  If you are between 45 years and 39 years old, ask your health care provider if you should take aspirin to prevent strokes.  Have regular diabetes screenings. This involves taking a blood sample to check your fasting blood sugar level.  If you are at a normal weight and have a low risk for diabetes,  have this test once every three years after 72 years of age.  If you are overweight and have a high risk for diabetes, consider being tested at a younger age or more often. PREVENTING INFECTION  Hepatitis B  If you have a higher risk for hepatitis B, you should be screened for this virus. You are considered at high risk for hepatitis B if:  You were born in a country where hepatitis B is common. Ask your health care provider which countries are considered high risk.  Your parents were born in a high-risk country, and you have not been immunized against hepatitis B (hepatitis B vaccine).  You have HIV or AIDS.  You use needles to inject street drugs.  You live with someone who has hepatitis B.  You have had sex with someone who has hepatitis B.  You get hemodialysis treatment.  You take certain medicines for conditions, including cancer, organ transplantation, and autoimmune conditions. Hepatitis C  Blood testing is recommended for:  Everyone born from [birth year] through 1967.  Anyone with known risk factors for hepatitis C. Sexually transmitted infections (STIs)  You should be screened for sexually transmitted infections (STIs) including gonorrhea and chlamydia if:  You are sexually active and are younger than 72 years of age.  You are older than 72 years of age and your health care provider tells you that you are at risk for this type of infection.  Your sexual activity has changed since you were last screened and you are at an increased risk for chlamydia or gonorrhea. Ask your health care provider if you are at risk.  If you do not have HIV, but are at risk, it may be recommended that you take a prescription medicine daily to prevent HIV infection. This is called pre-exposure prophylaxis (PrEP). You are considered at risk if:  You are sexually active and do not regularly use condoms or know the HIV status of your partner(s).  You take drugs by injection.  You are sexually  active with a partner who has HIV. Talk with your health care provider about whether you are at high risk of being infected with HIV. If you choose to begin PrEP, you should first be tested for HIV. You should then be tested every 3 months for as long as you are taking PrEP.  PREGNANCY   If you are premenopausal and you may become pregnant, ask your health care provider about preconception counseling.  If you may  become pregnant, take 400 to 800 micrograms (mcg) of folic acid every day.  If you want to prevent pregnancy, talk to your health care provider about birth control (contraception). OSTEOPOROSIS AND MENOPAUSE   Osteoporosis is a disease in which the bones lose minerals and strength with aging. This can result in serious bone fractures. Your risk for osteoporosis can be identified using a bone density scan.  If you are 61 years of age or older, or if you are at risk for osteoporosis and fractures, ask your health care provider if you should be screened.  Ask your health care provider whether you should take a calcium or vitamin D supplement to lower your risk for osteoporosis.  Menopause may have certain physical symptoms and risks.  Hormone replacement therapy may reduce some of these symptoms and risks. Talk to your health care provider about whether hormone replacement therapy is right for you.  HOME CARE INSTRUCTIONS   Schedule regular health, dental, and eye exams.  Stay current with your immunizations.   Do not use any tobacco products including cigarettes, chewing tobacco, or electronic cigarettes.  If you are pregnant, do not drink alcohol.  If you are breastfeeding, limit how much and how often you drink alcohol.  Limit alcohol intake to no more than 1 drink per day for nonpregnant women. One drink equals 12 ounces of beer, 5 ounces of wine, or 1 ounces of hard liquor.  Do not use street drugs.  Do not share needles.  Ask your health care provider for help if  you need support or information about quitting drugs.  Tell your health care provider if you often feel depressed.  Tell your health care provider if you have ever been abused or do not feel safe at home.   This information is not intended to replace advice given to you by your health care provider. Make sure you discuss any questions you have with your health care provider.   Document Released: 01/06/2011 Document Revised: 07/14/2014 Document Reviewed: 05/25/2013 Elsevier Interactive Patient Education [employer].

## 2015-12-18 NOTE — Progress Notes (Signed)
Subjective:    Patient ID: Abigail Moss, female    DOB: 1946/02/01, 70 y.o.   MRN: [id number]  PT presents to the office today for chronic follow up.  Hypertension This is a chronic problem. The current episode started more than 1 year ago. The problem has been resolved since onset. The problem is controlled. Associated symptoms include anxiety. Pertinent negatives include no headaches, palpitations, peripheral edema or shortness of breath. Risk factors for coronary artery disease include family history, obesity and post-menopausal state. Past treatments include angiotensin blockers. The current treatment provides moderate improvement. Hypertensive end-organ damage includes a thyroid problem. There is no history of kidney disease, CAD/MI, CVA or heart failure. There is no history of sleep apnea.  Thyroid Problem Presents for follow-up visit. Patient reports no anxiety, depressed mood, diarrhea, dry skin, hair loss or palpitations. The symptoms have been stable. Past treatments include levothyroxine. The treatment provided significant relief. There is no history of heart failure.  Gastroesophageal Reflux She reports no belching, no coughing, no heartburn or no sore throat. This is a chronic problem. The current episode started more than 1 year ago. The problem occurs rarely. The problem has been resolved. The symptoms are aggravated by certain foods. She has tried a PPI for the symptoms. The treatment provided significant relief.  Anxiety Presents for follow-up visit. Onset was more than 5 years ago. The problem has been waxing and waning. Patient reports no depressed mood, excessive worry, irritability, nervous/anxious behavior, palpitations or shortness of breath. The severity of symptoms is mild.   Her past medical history is significant for anxiety/panic attacks. Past treatments include benzodiazephines and SSRIs. The treatment provided significant relief.  Osteoporosis PT states she took  fosamax 70 mg weekly, calcium, and vit D. Pt's last Dexa Scan was 11/21/14    Review of Systems  Constitutional: Negative.  Negative for irritability.  HENT: Negative.  Negative for sore throat.   Eyes: Negative.   Respiratory: Negative.  Negative for cough and shortness of breath.   Cardiovascular: Negative.  Negative for palpitations.  Gastrointestinal: Negative.  Negative for heartburn and diarrhea.  Endocrine: Negative.   Genitourinary: Negative.   Musculoskeletal: Negative.   Neurological: Negative.  Negative for headaches.  Hematological: Negative.   Psychiatric/Behavioral: Negative.  The patient is not nervous/anxious.   All other systems reviewed and are negative.      Objective:   Physical Exam  Constitutional: She is oriented to person, place, and time. She appears well-developed and well-nourished. No distress.  HENT:  Head: Normocephalic and atraumatic.  Right Ear: External ear normal.  Left Ear: External ear normal.  Nose: Nose normal.  Mouth/Throat: Oropharynx is clear and moist.  Eyes: Pupils are equal, round, and reactive to light.  Neck: Normal range of motion. Neck supple. No thyromegaly present.  Cardiovascular: Normal rate, regular rhythm, normal heart sounds and intact distal pulses.   No murmur heard. Pulmonary/Chest: Effort normal and breath sounds normal. No respiratory distress. She has no wheezes.  Abdominal: Soft. Bowel sounds are normal. She exhibits no distension. There is no tenderness.  Musculoskeletal: Normal range of motion. She exhibits no edema or tenderness.  Neurological: She is alert and oriented to person, place, and time. She has normal reflexes. No cranial nerve deficit.  Skin: Skin is warm and dry.  Psychiatric: She has a normal mood and affect. Her behavior is normal. Judgment and thought content normal.  Vitals reviewed.    BP 136/86 mmHg  Pulse 70  Temp(Src)  98 F (36.7 C) (Oral)  Ht _0  (1.676 m)  Wt 184 lb (83.462 kg)   BMI 29.71 kg/m2      Assessment & Plan:  1. Essential hypertension - CMP14+EGFR  2. Allergic rhinitis, unspecified allergic rhinitis type - CMP14+EGFR - montelukast (SINGULAIR) 10 MG tablet; Take 1 tablet (10 mg total) by mouth at bedtime.  Dispense: 90 tablet; Refill: 1  3. Vitamin D deficiency - CMP14+EGFR - VITAMIN D 25 Hydroxy (Vit-D Deficiency, Fractures)  4. Osteoporosis - CMP14+EGFR - VITAMIN D 25 Hydroxy (Vit-D Deficiency, Fractures) - alendronate (FOSAMAX) 70 MG tablet; Take 1 tablet (70 mg total) by mouth every 7 (seven) days. Take with a full glass of water on an empty stomach.  Dispense: 4 tablet; Refill: 11  5. Hypothyroidism, unspecified hypothyroidism type - CMP14+EGFR - Thyroid Panel With TSH - levothyroxine (SYNTHROID, LEVOTHROID) 112 MCG tablet; TAKE ONE TABLET BY MOUTH ONE TIME DAILY BEFORE BREAKFAST  Dispense: 30 tablet; Refill: 0  6. Gastroesophageal reflux disease, esophagitis presence not specified  - CMP14+EGFR - omeprazole (PRILOSEC) 40 MG capsule; Take 1 capsule (40 mg total) by mouth daily.  Dispense: 90 capsule; Refill: 1  7. GAD (generalized anxiety disorder) - CMP14+EGFR - buPROPion (WELLBUTRIN XL) 300 MG 24 hr tablet; TAKE 1 TABLET (300 MG TOTAL) BY MOUTH DAILY.  Dispense: 90 tablet; Refill: 1 - ALPRAZolam (XANAX) 0.5 MG tablet; Take 1 tablet (0.5 mg total) by mouth at bedtime as needed.  Dispense: 30 tablet; Refill: 3  8. Need for hepatitis C screening test - CMP14+EGFR - Hepatitis C Antibody  9. Weight loss counseling, encounter for -Discussed diet and exercise - phentermine 37.5 MG capsule; Take 1 capsule (37.5 mg total) by mouth every morning.  Dispense: 30 capsule; Refill: 2  10. Overweight (BMI 25.0-29.9) - phentermine 37.5 MG capsule; Take 1 capsule (37.5 mg total) by mouth every morning.  Dispense: 30 capsule; Refill: 2   Continue all meds Labs pending Health Maintenance reviewed Diet and exercise encouraged RTO 6  months  [name], FNP

## 2015-12-19 ENCOUNTER — Other Ambulatory Visit: Payer: Self-pay | Admitting: *Deleted

## 2015-12-19 DIAGNOSIS — E039 Hypothyroidism, unspecified: Secondary | ICD-10-CM

## 2015-12-19 LAB — CMP14+EGFR
ALK PHOS: 65 IU/L (ref 39–117)
ALT: 21 IU/L (ref 0–32)
AST: 22 IU/L (ref 0–40)
Albumin/Globulin Ratio: 1.6 (ref 1.2–2.2)
Albumin: 4.3 g/dL (ref 3.5–4.8)
BILIRUBIN TOTAL: 0.3 mg/dL (ref 0.0–1.2)
BUN/Creatinine Ratio: 18 (ref 12–28)
BUN: 17 mg/dL (ref 8–27)
CHLORIDE: 103 mmol/L (ref 96–106)
CO2: 25 mmol/L (ref 18–29)
CREATININE: 0.92 mg/dL (ref 0.57–1.00)
Calcium: 9.6 mg/dL (ref 8.7–10.3)
GFR calc Af Amer: 72 mL/min/{1.73_m2} (ref >59)
GFR calc non Af Amer: 63 mL/min/{1.73_m2} (ref >59)
GLOBULIN, TOTAL: 2.7 g/dL (ref 1.5–4.5)
Glucose: 84 mg/dL (ref 65–99)
POTASSIUM: 4.2 mmol/L (ref 3.5–5.2)
SODIUM: 143 mmol/L (ref 134–144)
Total Protein: 7 g/dL (ref 6.0–8.5)

## 2015-12-19 LAB — VITAMIN D 25 HYDROXY (VIT D DEFICIENCY, FRACTURES): VIT D 25 HYDROXY: 48.2 ng/mL (ref 30.0–100.0)

## 2015-12-19 LAB — HEPATITIS C ANTIBODY: Hep C Virus Ab: <0.1 s/co ratio (ref 0.0–0.9)

## 2015-12-19 LAB — THYROID PANEL WITH TSH
Free Thyroxine Index: 2.4 (ref 1.2–4.9)
T3 Uptake Ratio: 28 % (ref 24–39)
T4 TOTAL: 8.6 ug/dL (ref 4.5–12.0)
TSH: 2.35 u[IU]/mL (ref 0.450–4.500)

## 2015-12-19 MED ORDER — LEVOTHYROXINE SODIUM 112 MCG PO TABS: ORAL_TABLET | ORAL | Status: DC

## 2015-12-25 ENCOUNTER — Ambulatory Visit (INDEPENDENT_AMBULATORY_CARE_PROVIDER_SITE_OTHER): Payer: Medicare Other | Admitting: Family

## 2015-12-25 ENCOUNTER — Encounter: Payer: Self-pay | Admitting: Family

## 2015-12-25 VITALS — BP 138/86 | HR 79 | Temp 97.4°F | Ht 66.0 in | Wt 187.0 lb

## 2015-12-25 DIAGNOSIS — J0100 Acute maxillary sinusitis, unspecified: Secondary | ICD-10-CM | POA: Diagnosis not present

## 2015-12-25 MED ORDER — AMOXICILLIN-POT CLAVULANATE 875-125 MG PO TABS: 1.0000 | ORAL_TABLET | Freq: Two times a day (BID) | ORAL | Status: DC

## 2015-12-25 MED ORDER — PREDNISONE 10 MG (21) PO TBPK: 10.0000 mg | ORAL_TABLET | Freq: Every day | ORAL | Status: DC

## 2015-12-25 NOTE — Progress Notes (Signed)
Subjective:    Patient ID: Abigail Moss, female    DOB: 09-18-1945, 70 y.o.   MRN: [id number]  Sinus Problem This is a new problem. The current episode started yesterday. The problem is unchanged. There has been no fever. Her pain is at a severity of 5/10. The pain is mild. Associated symptoms include congestion, coughing, ear pain, headaches, a hoarse voice, sinus pressure, sneezing and a sore throat. Pertinent negatives include no neck pain or shortness of breath. Past treatments include oral decongestants. The treatment provided mild relief.      Review of Systems  Constitutional: Negative.   HENT: Positive for congestion, ear pain, hoarse voice, sinus pressure, sneezing and sore throat.   Eyes: Negative.   Respiratory: Positive for cough. Negative for shortness of breath.   Cardiovascular: Negative.  Negative for palpitations.  Gastrointestinal: Negative.   Endocrine: Negative.   Genitourinary: Negative.   Musculoskeletal: Negative.  Negative for neck pain.  Neurological: Positive for headaches.  Hematological: Negative.   Psychiatric/Behavioral: Negative.   All other systems reviewed and are negative.      Objective:   Physical Exam  Constitutional: She is oriented to person, place, and time. She appears well-developed and well-nourished. No distress.  HENT:  Head: Normocephalic and atraumatic.  Right Ear: There is drainage (tube present).  Left Ear: There is drainage (present).  Nose: Right sinus exhibits maxillary sinus tenderness and frontal sinus tenderness. Left sinus exhibits maxillary sinus tenderness and frontal sinus tenderness.  Mouth/Throat: Posterior oropharyngeal erythema present.  Eyes: Pupils are equal, round, and reactive to light.  Neck: Normal range of motion. Neck supple. No thyromegaly present.  Cardiovascular: Normal rate, regular rhythm, normal heart sounds and intact distal pulses.   No murmur heard. Pulmonary/Chest: Effort normal and breath  sounds normal. No respiratory distress. She has no wheezes.  Abdominal: Soft. Bowel sounds are normal. She exhibits no distension. There is no tenderness.  Musculoskeletal: Normal range of motion. She exhibits no edema or tenderness.  Neurological: She is alert and oriented to person, place, and time. She has normal reflexes. No cranial nerve deficit.  Skin: Skin is warm and dry.  Psychiatric: She has a normal mood and affect. Her behavior is normal. Judgment and thought content normal.  Vitals reviewed.     BP 138/86 mmHg  Pulse 79  Temp(Src) 97.4 F (36.3 C) (Oral)  Ht  (1.676 m)  Wt 187 lb (84.823 kg)  BMI 30.20 kg/m2     Assessment & Plan:  1. Acute maxillary sinusitis, recurrence not specified -- Take meds as prescribed - Use a cool mist humidifier  -Use saline nose sprays frequently -Saline irrigations of the nose can be very helpful if done frequently.  * 4X daily for 1 week*  * Use of a nettie pot can be helpful with this. Follow directions with this* -Force fluids -For any cough or congestion  Use plain Mucinex- regular strength or max strength is fine   * Children- consult with Pharmacist for dosing -For fever or aces or pains- take tylenol or ibuprofen appropriate for age and weight.  * for fevers greater than 101 orally you may alternate ibuprofen and tylenol every  3 hours. -Throat lozenges if help - predniSONE (STERAPRED UNI-PAK 21 TAB) 10 MG (21) TBPK tablet; Take 1 tablet (10 mg total) by mouth daily. As directed x 6 days  Dispense: 21 tablet; Refill: 0 - amoxicillin-clavulanate (AUGMENTIN) 875-125 MG tablet; Take 1 tablet by mouth 2 (two) times  daily.  Dispense: 14 tablet; Refill: 0  Jannifer Rodneyhristy Yuliana Vandrunen, FNP

## 2015-12-25 NOTE — Patient Instructions (Signed)
Sinusitis, Adult Sinusitis is redness, soreness, and inflammation of the paranasal sinuses. Paranasal sinuses are air pockets within the bones of your face. They are located beneath your eyes, in the middle of your forehead, and above your eyes. In healthy paranasal sinuses, mucus is able to drain out, and air is able to circulate through them by way of your nose. However, when your paranasal sinuses are inflamed, mucus and air can become trapped. This can allow bacteria and other germs to grow and cause infection. Sinusitis can develop quickly and last only a short time (acute) or continue over a long period (chronic). Sinusitis that lasts for more than 12 weeks is considered chronic. CAUSES Causes of sinusitis include:  Allergies.  Structural abnormalities, such as displacement of the cartilage that separates your nostrils (deviated septum), which can decrease the air flow through your nose and sinuses and affect sinus drainage.  Functional abnormalities, such as when the small hairs (cilia) that line your sinuses and help remove mucus do not work properly or are not present. SIGNS AND SYMPTOMS Symptoms of acute and chronic sinusitis are the same. The primary symptoms are pain and pressure around the affected sinuses. Other symptoms include:  Upper toothache.  Earache.  Headache.  Bad breath.  Decreased sense of smell and taste.  A cough, which worsens when you are lying flat.  Fatigue.  Fever.  Thick drainage from your nose, which often is green and may contain pus (purulent).  Swelling and warmth over the affected sinuses. DIAGNOSIS Your health care provider will perform a physical exam. During your exam, your health care provider may perform any of the following to help determine if you have acute sinusitis or chronic sinusitis:  Look in your nose for signs of abnormal growths in your nostrils (nasal polyps).  Tap over the affected sinus to check for signs of  infection.  View the inside of your sinuses using an imaging device that has a light attached (endoscope). If your health care provider suspects that you have chronic sinusitis, one or more of the following tests may be recommended:  Allergy tests.  Nasal culture. A sample of mucus is taken from your nose, sent to a lab, and screened for bacteria.  Nasal cytology. A sample of mucus is taken from your nose and examined by your health care provider to determine if your sinusitis is related to an allergy. TREATMENT Most cases of acute sinusitis are related to a viral infection and will resolve on their own within 10 days. Sometimes, medicines are prescribed to help relieve symptoms of both acute and chronic sinusitis. These may include pain medicines, decongestants, nasal steroid sprays, or saline sprays. However, for sinusitis related to a bacterial infection, your health care provider will prescribe antibiotic medicines. These are medicines that will help kill the bacteria causing the infection. Rarely, sinusitis is caused by a fungal infection. In these cases, your health care provider will prescribe antifungal medicine. For some cases of chronic sinusitis, surgery is needed. Generally, these are cases in which sinusitis recurs more than 3 times per year, despite other treatments. HOME CARE INSTRUCTIONS  Drink plenty of water. Water helps thin the mucus so your sinuses can drain more easily.  Use a humidifier.  Inhale steam 3-4 times a day (for example, sit in the bathroom with the shower running).  Apply a warm, moist washcloth to your face 3-4 times a day, or as directed by your health care provider.  Use saline nasal sprays to help   moisten and clean your sinuses.  Take medicines only as directed by your health care provider.  If you were prescribed either an antibiotic or antifungal medicine, finish it all even if you start to feel better. SEEK IMMEDIATE MEDICAL CARE IF:  You have  increasing pain or severe headaches.  You have nausea, vomiting, or drowsiness.  You have swelling around your face.  You have vision problems.  You have a stiff neck.  You have difficulty breathing.   This information is not intended to replace advice given to you by your health care provider. Make sure you discuss any questions you have with your health care provider.   Document Released: 06/23/2005 Document Revised: 07/14/2014 Document Reviewed: 07/08/2011 Elsevier Interactive Patient Education 2016 [employer]  - Take meds as prescribed - Use a cool mist humidifier  -Use saline nose sprays frequently -Saline irrigations of the nose can be very helpful if done frequently.  * 4X daily for 1 week*  * Use of a nettie pot can be helpful with this. Follow directions with this* -Force fluids -For any cough or congestion  Use plain Mucinex- regular strength or max strength is fine   * Children- consult with Pharmacist for dosing -For fever or aces or pains- take tylenol or ibuprofen appropriate for age and weight.  * for fevers greater than 101 orally you may alternate ibuprofen and tylenol every  3 hours. -Throat lozenges if help   Christy [name], FNP   

## 2016-01-02 ENCOUNTER — Other Ambulatory Visit: Payer: Self-pay | Admitting: Family

## 2016-02-26 ENCOUNTER — Encounter: Payer: Self-pay | Admitting: Family

## 2016-02-26 ENCOUNTER — Ambulatory Visit (INDEPENDENT_AMBULATORY_CARE_PROVIDER_SITE_OTHER): Payer: Medicare Other | Admitting: Family

## 2016-02-26 VITALS — BP 133/89 | HR 70 | Temp 97.1°F | Ht 66.0 in | Wt 184.0 lb

## 2016-02-26 DIAGNOSIS — J320 Chronic maxillary sinusitis: Secondary | ICD-10-CM

## 2016-02-26 DIAGNOSIS — J302 Other seasonal allergic rhinitis: Secondary | ICD-10-CM | POA: Diagnosis not present

## 2016-02-26 MED ORDER — FLUTICASONE PROPIONATE 50 MCG/ACT NA SUSP: 2.0000 | Freq: Every day | NASAL | 2 refills | Status: DC

## 2016-02-26 MED ORDER — AMOXICILLIN-POT CLAVULANATE 875-125 MG PO TABS: 1.0000 | ORAL_TABLET | Freq: Two times a day (BID) | ORAL | 0 refills | Status: DC

## 2016-02-26 NOTE — Patient Instructions (Signed)

## 2016-02-26 NOTE — Progress Notes (Signed)
   Subjective:    Patient ID: Abigail SprangRosa S Cimino, female    DOB: 26-Oct-1945, 70 y.o.   MRN: [id number]  Sinus Problem  This is a recurrent problem. The current episode started in the past 7 days. The problem has been waxing and waning since onset. There has been no fever. Her pain is at a severity of 3/10. The pain is mild. Associated symptoms include congestion, coughing, headaches, a hoarse voice, sinus pressure and a sore throat. Pertinent negatives include no ear pain. Past treatments include saline sprays, oral decongestants, spray decongestants and sitting up. The treatment provided mild relief.      Review of Systems  HENT: Positive for congestion, hoarse voice, sinus pressure and sore throat. Negative for ear pain.   Respiratory: Positive for cough.   Neurological: Positive for headaches.  All other systems reviewed and are negative.      Objective:   Physical Exam  Constitutional: She is oriented to person, place, and time. She appears well-developed and well-nourished. No distress.  HENT:  Head: Normocephalic and atraumatic.  Right Ear: There is drainage.  Left Ear: There is drainage (tube present). A middle ear effusion is present.  Nose: Mucosal edema and rhinorrhea present. Right sinus exhibits maxillary sinus tenderness. Left sinus exhibits maxillary sinus tenderness.  Mouth/Throat: Oropharynx is clear and moist.  Eyes: Pupils are equal, round, and reactive to light.  Neck: Normal range of motion. Neck supple. No thyromegaly present.  Cardiovascular: Normal rate, regular rhythm, normal heart sounds and intact distal pulses.   No murmur heard. Pulmonary/Chest: Effort normal and breath sounds normal. No respiratory distress. She has no wheezes.  Abdominal: Soft. Bowel sounds are normal. She exhibits no distension. There is no tenderness.  Musculoskeletal: Normal range of motion. She exhibits no edema or tenderness.  Neurological: She is alert and oriented to person, place, and  time. She has normal reflexes. No cranial nerve deficit.  Skin: Skin is warm and dry.  Psychiatric: She has a normal mood and affect. Her behavior is normal. Judgment and thought content normal.  Vitals reviewed.     BP 133/89   Pulse 70   Temp 97.1 F (36.2 C) (Oral)   Ht 5\' 6"  (1.676 m)   Wt 184 lb (83.5 kg)   BMI 29.70 kg/m      Assessment & Plan:  1. Seasonal allergic rhinitis - fluticasone (FLONASE) 50 MCG/ACT nasal spray; Place 2 sprays into both nostrils daily.  Dispense: 16 g; Refill: 2 - Ambulatory referral to ENT  2. Chronic maxillary sinusitis -Continue Singulair daily - Take meds as prescribed - Use a cool mist humidifier  -Use saline nose sprays frequently -Saline irrigations of the nose can be very helpful if done frequently.  * 4X daily for 1 week*  * Use of a nettie pot can be helpful with this. Follow directions with this* -Force fluids -For any cough or congestion  Use plain Mucinex- regular strength or max strength is fine   * Children- consult with Pharmacist for dosing -For fever or aces or pains- take tylenol or ibuprofen appropriate for age and weight.  * for fevers greater than 101 orally you may alternate ibuprofen and tylenol every  3 hours. -Throat lozenges if help - Ambulatory referral to ENT - amoxicillin-clavulanate (AUGMENTIN) 875-125 MG tablet; Take 1 tablet by mouth 2 (two) times daily.  Dispense: 14 tablet; Refill: 0  Jannifer Rodneyhristy [name], FNP

## 2016-04-15 DIAGNOSIS — M25511 Pain in right shoulder: Secondary | ICD-10-CM | POA: Diagnosis not present

## 2016-04-15 DIAGNOSIS — G8929 Other chronic pain: Secondary | ICD-10-CM | POA: Diagnosis not present

## 2016-04-18 ENCOUNTER — Telehealth: Payer: Self-pay | Admitting: Family Medicine

## 2016-05-26 ENCOUNTER — Ambulatory Visit (INDEPENDENT_AMBULATORY_CARE_PROVIDER_SITE_OTHER): Payer: Medicare Other | Admitting: Family Medicine

## 2016-05-26 ENCOUNTER — Encounter: Payer: Self-pay | Admitting: Family Medicine

## 2016-05-26 VITALS — BP 143/88 | HR 78 | Temp 97.2°F | Ht 66.0 in | Wt 187.0 lb

## 2016-05-26 DIAGNOSIS — J0101 Acute recurrent maxillary sinusitis: Secondary | ICD-10-CM

## 2016-05-26 MED ORDER — PREDNISONE 20 MG PO TABS: ORAL_TABLET | ORAL | 0 refills | Status: DC

## 2016-05-26 MED ORDER — AZITHROMYCIN 250 MG PO TABS: ORAL_TABLET | ORAL | 0 refills | Status: DC

## 2016-05-26 NOTE — Progress Notes (Signed)
BP (!) 141/92   Pulse 78   Temp 97.2 F (36.2 C) (Oral)   Ht 5\' 6"  (1.676 m)   Wt 187 lb (84.8 kg)   BMI 30.18 kg/m    Subjective:    Patient ID: Abigail Moss, female    DOB: May 21, 1946, 70 y.o.   MRN: [id number]  HPI: Abigail Moss is a 70 y.o. female presenting on 05/26/2016 for Laryngitis (couple days, mucinex, worse yesterday, no known fever); Sore Throat; and Facial Pain   HPI Sore throat and sinus congestion and pressure Patient is been having sore throat and sinus congestion is been going on for the past 2 days. She is concerned because when she does get benefit flares up and usually gets worse in Thanksgiving is coming to try to get an early. She denies any fevers or chills but does have a lot of sinus pressure anteriorly in her frontal and maxillary sinuses. She is having a cough and sore throat and the cough is productive of yellow-green sputum. She denies any shortness of breath or wheezing. She is using her Flonase and she did use her albuterol inhaler once. And she is also using Mucinex.  Relevant past medical, surgical, family and social history reviewed and updated as indicated. Interim medical history since our last visit reviewed. Allergies and medications reviewed and updated.  Review of Systems  Constitutional: Negative for chills and fever.  HENT: Positive for congestion, postnasal drip, rhinorrhea, sinus pressure, sneezing and sore throat. Negative for ear discharge and ear pain.   Eyes: Negative for pain, redness and visual disturbance.  Respiratory: Positive for cough. Negative for chest tightness, shortness of breath and wheezing.   Cardiovascular: Negative for chest pain and leg swelling.  Genitourinary: Negative for difficulty urinating and dysuria.  Musculoskeletal: Negative for back pain and gait problem.  Skin: Negative for rash.  Neurological: Negative for light-headedness and headaches.  Psychiatric/Behavioral: Negative for agitation and behavioral  problems.  All other systems reviewed and are negative.   Per HPI unless specifically indicated above      Objective:    BP (!) 141/92   Pulse 78   Temp 97.2 F (36.2 C) (Oral)   Ht 5\' 6"  (1.676 m)   Wt 187 lb (84.8 kg)   BMI 30.18 kg/m   Wt Readings from Last 3 Encounters:  05/26/16 187 lb (84.8 kg)  02/26/16 184 lb (83.5 kg)  12/25/15 187 lb (84.8 kg)    Physical Exam  Constitutional: She is oriented to person, place, and time. She appears well-developed and well-nourished. No distress.  HENT:  Right Ear: Tympanic membrane, external ear and ear canal normal.  Left Ear: Tympanic membrane, external ear and ear canal normal.  Nose: Mucosal edema and rhinorrhea present. No epistaxis. Right sinus exhibits maxillary sinus tenderness and frontal sinus tenderness. Left sinus exhibits maxillary sinus tenderness and frontal sinus tenderness.  Mouth/Throat: Uvula is midline and mucous membranes are normal. Posterior oropharyngeal edema and posterior oropharyngeal erythema present. No oropharyngeal exudate or tonsillar abscesses.  Eyes: Conjunctivae are normal.  Cardiovascular: Normal rate, regular rhythm, normal heart sounds and intact distal pulses.   No murmur heard. Pulmonary/Chest: Effort normal and breath sounds normal. No respiratory distress. She has no wheezes. She has no rales.  Musculoskeletal: Normal range of motion. She exhibits no edema or tenderness.  Neurological: She is alert and oriented to person, place, and time. Coordination normal.  Skin: Skin is warm and dry. No rash noted. She is not  diaphoretic.  Psychiatric: She has a normal mood and affect. Her behavior is normal.  Vitals reviewed.     Assessment & Plan:   Problem List Items Addressed This Visit    None    Visit Diagnoses    Acute recurrent maxillary sinusitis    -  Primary   Relevant Medications   azithromycin (ZITHROMAX) 250 MG tablet   predniSONE (DELTASONE) 20 MG tablet       Follow up  plan: Return if symptoms worsen or fail to improve.  Counseling provided for all of the vaccine components No orders of the defined types were placed in this encounter.   Arville CareJoshua Dettinger, MD [facility] 05/26/2016, 11:44 AM

## 2016-06-17 ENCOUNTER — Other Ambulatory Visit: Payer: Self-pay | Admitting: Family

## 2016-06-17 DIAGNOSIS — K219 Gastro-esophageal reflux disease without esophagitis: Secondary | ICD-10-CM

## 2016-06-17 DIAGNOSIS — J309 Allergic rhinitis, unspecified: Secondary | ICD-10-CM

## 2016-06-19 ENCOUNTER — Encounter: Payer: Self-pay | Admitting: Family

## 2016-06-19 ENCOUNTER — Telehealth: Payer: Self-pay | Admitting: Family

## 2016-06-19 ENCOUNTER — Ambulatory Visit (INDEPENDENT_AMBULATORY_CARE_PROVIDER_SITE_OTHER): Payer: Medicare Other | Admitting: Family

## 2016-06-19 VITALS — BP 135/84 | HR 71 | Temp 97.3°F | Ht 66.0 in | Wt 185.0 lb

## 2016-06-19 DIAGNOSIS — S161XXA Strain of muscle, fascia and tendon at neck level, initial encounter: Secondary | ICD-10-CM

## 2016-06-19 DIAGNOSIS — M542 Cervicalgia: Secondary | ICD-10-CM

## 2016-06-19 MED ORDER — NAPROXEN 500 MG PO TABS: 500.0000 mg | ORAL_TABLET | Freq: Two times a day (BID) | ORAL | 1 refills | Status: DC

## 2016-06-19 MED ORDER — CYCLOBENZAPRINE HCL 5 MG PO TABS: 5.0000 mg | ORAL_TABLET | Freq: Three times a day (TID) | ORAL | 0 refills | Status: DC | PRN

## 2016-06-19 MED ORDER — PREDNISONE 10 MG (21) PO TBPK: ORAL_TABLET | ORAL | 0 refills | Status: DC

## 2016-06-19 NOTE — Patient Instructions (Signed)
Cervical Sprain A cervical sprain is a stretch or tear in one or more of the tough, cord-like tissues that connect bones (ligaments) in the neck. Cervical sprains can range from mild to severe. Severe cervical sprains can cause the spinal bones (vertebrae) in the neck to be unstable. This can lead to spinal cord damage and can result in serious nervous system problems. The amount of time that it takes for a cervical sprain to get better depends on the cause and extent of the injury. Most cervical sprains heal in 4-6 weeks. What are the causes? Cervical sprains may be caused by an injury (trauma), such as from a motor vehicle accident, a fall, or sudden forward and backward whipping movement of the head and neck (whiplash injury). Mild cervical sprains may be caused by wear and tear over time, such as from poor posture, sitting in a chair that does not provide support, or looking up or down for long periods of time. What increases the risk? The following factors may make you more likely to develop this condition:  Participating in activities that have a high risk of trauma to the neck. These include contact sports, auto racing, gymnastics, and diving.  Taking risks when driving or riding in a motor vehicle, such as speeding.  Having osteoarthritis of the spine.  Having poor strength and flexibility of the neck.  A previous neck injury.  Having poor posture.  Spending a lot of time in certain positions that put stress on the neck, such as sitting at a computer for long periods of time. What are the signs or symptoms? Symptoms of this condition include:  Pain, soreness, stiffness, tenderness, swelling, or a burning sensation in the front, back, or sides of the neck.  Sudden tightening of neck muscles that you cannot control (muscle spasms).  Pain in the shoulders or upper back.  Limited ability to move the neck.  Headache.  Dizziness.  Nausea.  Vomiting.  Weakness, numbness, or  tingling in a hand or an arm. Symptoms may develop right away after injury, or they may develop over a few days. In some cases, symptoms may go away with treatment and return (recur) over time. How is this diagnosed? This condition may be diagnosed based on:  Your medical history.  Your symptoms.  Any recent injuries or known neck problems that you have, such as arthritis in the neck.  A physical exam.  Imaging tests, such as:  X-rays.  MRI.  CT scan. How is this treated? This condition is treated by resting and icing the injured area and doing physical therapy exercises. Depending on the severity of your condition, treatment may also include:  Keeping your neck in place (immobilized) for periods of time. This may be done using:  A cervical collar. This supports your chin and the back of your head.  A cervical traction device. This is a sling that holds up your head. This removes weight and pressure from your neck, and it may help to relieve pain.  Medicines that help to relieve pain and inflammation.  Medicines that help to relax your muscles (muscle relaxants).  Surgery. This is rare. Follow these instructions at home: If you have a cervical collar:   Wear it as told by your health care provider. Do not remove the collar unless instructed by your health care provider.  Ask your health care provider before you make any adjustments to your collar.  If you have long hair, keep it outside of the collar.    Ask your health care provider if you can remove the collar for cleaning and bathing. If you are allowed to remove the collar for cleaning or bathing:  Follow instructions from your health care provider about how to remove the collar safely.  Clean the collar by wiping it with mild soap and water and drying it completely.  If your collar has removable pads, remove them every 1-2 days and wash them by hand with soap and water. Let them air-dry completely before you put  them back in the collar.  Check your skin under the collar for irritation or sores. If you see any, tell your health care provider. Managing pain, stiffness, and swelling   If directed, use a cervical traction device as told by your health care provider.  If directed, apply heat to the affected area before you do your physical therapy or as often as told by your health care provider. Use the heat source that your health care provider recommends, such as a moist heat pack or a heating pad.  Place a towel between your skin and the heat source.  Leave the heat on for 20-30 minutes.  Remove the heat if your skin turns bright red. This is especially important if you are unable to feel pain, heat, or cold. You may have a greater risk of getting burned.  If directed, put ice on the affected area:  Put ice in a plastic bag.  Place a towel between your skin and the bag.  Leave the ice on for 20 minutes, 2-3 times a day. Activity   Do not drive while wearing a cervical collar. If you do not have a cervical collar, ask your health care provider if it is safe to drive while your neck heals.  Do not drive or use heavy machinery while taking prescription pain medicine or muscle relaxants, unless your health care provider approves.  Do not lift anything that is heavier than 10 lb (4.5 kg) until your health care provider tells you that it is safe.  Rest as directed by your health care provider. Avoid positions and activities that make your symptoms worse. Ask your health care provider what activities are safe for you.  If physical therapy was prescribed, do exercises as told by your health care provider or physical therapist. General instructions   Take over-the-counter and prescription medicines only as told by your health care provider.  Do not use any products that contain nicotine or tobacco, such as cigarettes and e-cigarettes. These can delay healing. If you need help quitting, ask your  health care provider.  Keep all follow-up visits as told by your health care provider or physical therapist. This is important. How is this prevented? To prevent a cervical sprain from happening again:  Use and maintain good posture. Make any needed adjustments to your workstation to help you use good posture.  Exercise regularly as directed by your health care provider or physical therapist.  Avoid risky activities that may cause a cervical sprain. Contact a health care provider if:  You have symptoms that get worse or do not get better after 2 weeks of treatment.  You have pain that gets worse or does not get better with medicine.  You develop new, unexplained symptoms.  You have sores or irritated skin on your neck from wearing your cervical collar. Get help right away if:  You have severe pain.  You develop numbness, tingling, or weakness in any part of your body.  You cannot move   a part of your body (you have paralysis).  You have neck pain along with:  Severe dizziness.  Headache. Summary  A cervical sprain is a stretch or tear in one or more of the tough, cord-like tissues that connect bones (ligaments) in the neck.  Cervical sprains may be caused by an injury (trauma), such as from a motor vehicle accident, a fall, or sudden forward and backward whipping movement of the head and neck (whiplash injury).  Symptoms may develop right away after injury, or they may develop over a few days.  This condition is treated by resting and icing the injured area and doing physical therapy exercises. This information is not intended to replace advice given to you by your health care provider. Make sure you discuss any questions you have with your health care provider. Document Released: 04/20/2007 Document Revised: 02/20/2016 Document Reviewed: 02/20/2016 Elsevier Interactive Patient Education  2017 [employer]  

## 2016-06-19 NOTE — Telephone Encounter (Signed)
Pt wants to know if she can get Methocarbamol instead of flexeril.

## 2016-06-19 NOTE — Progress Notes (Signed)
   Subjective:    Patient ID: Abigail Moss, female    DOB: 1946/03/12, 70 y.o.   MRN: [id number]  Neck Pain   This is a new problem. The current episode started 1 to 4 weeks ago. The problem occurs intermittently. The problem has been waxing and waning. The pain is associated with a sleep position. The pain is present in the left side. The quality of the pain is described as aching. The pain is at a severity of 5/10. The pain is moderate. The symptoms are aggravated by twisting. Pertinent negatives include no headaches, leg pain, numbness, photophobia, trouble swallowing, weakness or weight loss. She has tried bed rest and NSAIDs for the symptoms. The treatment provided mild relief.      Review of Systems  Constitutional: Negative for weight loss.  HENT: Negative for trouble swallowing.   Eyes: Negative for photophobia.  Musculoskeletal: Positive for neck pain.  Neurological: Negative for weakness, numbness and headaches.  All other systems reviewed and are negative.      Objective:   Physical Exam  Constitutional: She is oriented to person, place, and time. She appears well-developed and well-nourished.  HENT:  Head: Normocephalic.  Neck: Normal range of motion. Neck supple.  Cardiovascular: Normal rate, regular rhythm, normal heart sounds and intact distal pulses.   Pulmonary/Chest: Effort normal and breath sounds normal.  Musculoskeletal: Normal range of motion. She exhibits tenderness (mild tendernss in  left posterior neck).  Neurological: She is alert and oriented to person, place, and time.      BP 135/84   Pulse 71   Temp 97.3 F (36.3 C) (Oral)   Ht 5\' 6"  (1.676 m)   Wt 185 lb (83.9 kg)   BMI 29.86 kg/m      Assessment & Plan:  1. Neck pain - predniSONE (STERAPRED UNI-PAK 21 TAB) 10 MG (21) TBPK tablet; Use as directed  Dispense: 21 tablet; Refill: 0 - cyclobenzaprine (FLEXERIL) 5 MG tablet; Take 1 tablet (5 mg total) by mouth 3 (three) times daily as needed for  muscle spasms.  Dispense: 30 tablet; Refill: 0 - naproxen (NAPROSYN) 500 MG tablet; Take 1 tablet (500 mg total) by mouth 2 (two) times daily with a meal.  Dispense: 60 tablet; Refill: 1  2. Strain of neck muscle, initial encounter -Rest -Ice and heat -No other NSAID's -Sedation precaution discussed RTO prn  - predniSONE (STERAPRED UNI-PAK 21 TAB) 10 MG (21) TBPK tablet; Use as directed  Dispense: 21 tablet; Refill: 0 - cyclobenzaprine (FLEXERIL) 5 MG tablet; Take 1 tablet (5 mg total) by mouth 3 (three) times daily as needed for muscle spasms.  Dispense: 30 tablet; Refill: 0 - naproxen (NAPROSYN) 500 MG tablet; Take 1 tablet (500 mg total) by mouth 2 (two) times daily with a meal.  Dispense: 60 tablet; Refill: 1  Jannifer Rodneyhristy Rodricus Candelaria, FNP

## 2016-06-23 MED ORDER — METHOCARBAMOL 500 MG PO TABS: 500.0000 mg | ORAL_TABLET | Freq: Four times a day (QID) | ORAL | 1 refills | Status: DC

## 2016-06-23 NOTE — Telephone Encounter (Signed)
Flexeril changed to Robaxin

## 2016-06-24 ENCOUNTER — Other Ambulatory Visit: Payer: Self-pay | Admitting: Family

## 2016-06-24 DIAGNOSIS — J309 Allergic rhinitis, unspecified: Secondary | ICD-10-CM

## 2016-06-24 DIAGNOSIS — F411 Generalized anxiety disorder: Secondary | ICD-10-CM

## 2016-06-26 ENCOUNTER — Encounter: Payer: Self-pay | Admitting: Family

## 2016-06-26 ENCOUNTER — Ambulatory Visit (INDEPENDENT_AMBULATORY_CARE_PROVIDER_SITE_OTHER): Payer: Medicare Other | Admitting: Family

## 2016-06-26 DIAGNOSIS — M542 Cervicalgia: Secondary | ICD-10-CM | POA: Diagnosis not present

## 2016-06-26 DIAGNOSIS — S161XXD Strain of muscle, fascia and tendon at neck level, subsequent encounter: Secondary | ICD-10-CM

## 2016-06-26 MED ORDER — PREDNISONE 10 MG (21) PO TBPK: ORAL_TABLET | ORAL | 0 refills | Status: DC

## 2016-06-26 NOTE — Progress Notes (Signed)
   Subjective:    Patient ID: Abigail Moss, female    DOB: May 24, 1946, 70 y.o.   MRN: [id number]  Neck Pain   This is a recurrent problem. The current episode started 1 to 4 weeks ago. The problem occurs intermittently. The problem has been waxing and waning. Associated with: mopped and swept her floors. The pain is present in the left side. The pain is at a severity of 8/10. The pain is moderate. The symptoms are aggravated by twisting and bending. Pertinent negatives include no headaches, leg pain, photophobia, syncope, visual change or weakness. She has tried ice (naproxen, robaxin, and prednisone) for the symptoms. The treatment provided significant (Pt states this helped and was feeling great but then "over did it" woking on her house) relief.      Review of Systems  Eyes: Negative for photophobia.  Cardiovascular: Negative for syncope.  Musculoskeletal: Positive for neck pain.  Neurological: Negative for weakness and headaches.  All other systems reviewed and are negative.      Objective:   Physical Exam  Constitutional: She is oriented to person, place, and time. She appears well-developed and well-nourished. No distress.  HENT:  Head: Normocephalic.  Neck: Normal range of motion.  Left Neck tenderness present with rotation and flexion  Cardiovascular: Normal rate, regular rhythm, normal heart sounds and intact distal pulses.   No murmur heard. Pulmonary/Chest: Effort normal and breath sounds normal. No respiratory distress. She has no wheezes.  Abdominal: Soft. Bowel sounds are normal. She exhibits no distension. There is no tenderness.  Musculoskeletal: Normal range of motion. She exhibits no edema or tenderness.  Neurological: She is alert and oriented to person, place, and time. She has normal reflexes. No cranial nerve deficit.  Skin: Skin is warm and dry.  Psychiatric: She has a normal mood and affect. Her behavior is normal. Judgment and thought content normal.  Vitals  reviewed.     BP 130/88   Pulse 71   Temp 97.3 F (36.3 C) (Oral)   Ht 5\' 6"  (1.676 m)   Wt 188 lb (85.3 kg)   BMI 30.34 kg/m      Assessment & Plan:  1. Neck pain - predniSONE (STERAPRED UNI-PAK 21 TAB) 10 MG (21) TBPK tablet; Use as directed  Dispense: 21 tablet; Refill: 0  2. Strain of neck muscle, subsequent encounter - predniSONE (STERAPRED UNI-PAK 21 TAB) 10 MG (21) TBPK tablet; Use as directed  Dispense: 21 tablet; Refill: 0  Rest Ice and heat ROM exercises discussed Discussed that we will give one more rx of prednisone, but that we could not refill this again Continue naproxen and Robaxin  RTO prn  Jannifer Rodneyhristy Tamiko Leopard, FNP

## 2016-06-26 NOTE — Patient Instructions (Signed)
Cervical Strain and Sprain Rehab Ask your health care provider which exercises are safe for you. Do exercises exactly as told by your health care provider and adjust them as directed. It is normal to feel mild stretching, pulling, tightness, or discomfort as you do these exercises, but you should stop right away if you feel sudden pain or your pain gets worse.Do not begin these exercises until told by your health care provider. Stretching and range of motion exercises These exercises warm up your muscles and joints and improve the movement and flexibility of your neck. These exercises also help to relieve pain, numbness, and tingling. Exercise A: Cervical side bend 1. Using good posture, sit on a stable chair or stand up. 2. Without moving your shoulders, slowly tilt your left / right ear to your shoulder until you feel a stretch in your neck muscles. You should be looking straight ahead. 3. Hold for __________ seconds. 4. Repeat with the other side of your neck. Repeat __________ times. Complete this exercise __________ times a day. Exercise B: Cervical rotation 1. Using good posture, sit on a stable chair or stand up. 2. Slowly turn your head to the side as if you are looking over your left / right shoulder.  Keep your eyes level with the ground.  Stop when you feel a stretch along the side and the back of your neck. 3. Hold for __________ seconds. 4. Repeat this by turning to your other side. Repeat __________ times. Complete this exercise __________ times a day. Exercise C: Thoracic extension and pectoral stretch 1. Roll a towel or a small blanket so it is about 4 inches (10 cm) in diameter. 2. Lie down on your back on a firm surface. 3. Put the towel lengthwise, under your spine in the middle of your back. It should not be not under your shoulder blades. The towel should line up with your spine from your middle back to your lower back. 4. Put your hands behind your head and let your  elbows fall out to your sides. 5. Hold for __________ seconds. Repeat __________ times. Complete this exercise __________ times a day. Strengthening exercises These exercises build strength and endurance in your neck. Endurance is the ability to use your muscles for a long time, even after your muscles get tired. Exercise D: Upper cervical flexion, isometric 1. Lie on your back with a thin pillow behind your head and a small rolled-up towel under your neck. 2. Gently tuck your chin toward your chest and nod your head down to look toward your feet. Do not lift your head off the pillow. 3. Hold for __________ seconds. 4. Release the tension slowly. Relax your neck muscles completely before you repeat this exercise. Repeat __________ times. Complete this exercise __________ times a day. Exercise E: Cervical extension, isometric 1. Stand about 6 inches (15 cm) away from a wall, with your back facing the wall. 2. Place a soft object, about 6-8 inches (15-20 cm) in diameter, between the back of your head and the wall. A soft object could be a small pillow, a ball, or a folded towel. 3. Gently tilt your head back and press into the soft object. Keep your jaw and forehead relaxed. 4. Hold for __________ seconds. 5. Release the tension slowly. Relax your neck muscles completely before you repeat this exercise. Repeat __________ times. Complete this exercise __________ times a day. Posture and body mechanics   Body mechanics refers to the movements and positions of your body   while you do your daily activities. Posture is part of body mechanics. Good posture and healthy body mechanics can help to relieve stress in your body's tissues and joints. Good posture means that your spine is in its natural S-curve position (your spine is neutral), your shoulders are pulled back slightly, and your head is not tipped forward. The following are general guidelines for applying improved posture and body mechanics to your  everyday activities. Standing  When standing, keep your spine neutral and keep your feet about hip-width apart. Keep a slight bend in your knees. Your ears, shoulders, and hips should line up.  When you do a task in which you stand in one place for a long time, place one foot up on a stable object that is 2-4 inches (5-10 cm) high, such as a footstool. This helps keep your spine neutral. Sitting  When sitting, keep your spine neutral and your keep feet flat on the floor. Use a footrest, if necessary, and keep your thighs parallel to the floor. Avoid rounding your shoulders, and avoid tilting your head forward.  When working at a desk or a computer, keep your desk at a height where your hands are slightly lower than your elbows. Slide your chair under your desk so you are close enough to maintain good posture.  When working at a computer, place your monitor at a height where you are looking straight ahead and you do not have to tilt your head forward or downward to look at the screen. Resting When lying down and resting, avoid positions that are most painful for you. Try to support your neck in a neutral position. You can use a contour pillow or a small rolled-up towel. Your pillow should support your neck but not push on it. This information is not intended to replace advice given to you by your health care provider. Make sure you discuss any questions you have with your health care provider. Document Released: 06/23/2005 Document Revised: 02/28/2016 Document Reviewed: 05/30/2015 Elsevier Interactive Patient Education  2017 Elsevier Inc.  

## 2016-07-08 DIAGNOSIS — G44219 Episodic tension-type headache, not intractable: Secondary | ICD-10-CM | POA: Diagnosis not present

## 2016-07-08 DIAGNOSIS — H40033 Anatomical narrow angle, bilateral: Secondary | ICD-10-CM | POA: Diagnosis not present

## 2016-07-15 ENCOUNTER — Other Ambulatory Visit: Payer: Self-pay | Admitting: Family

## 2016-07-15 DIAGNOSIS — E039 Hypothyroidism, unspecified: Secondary | ICD-10-CM

## 2016-07-18 ENCOUNTER — Encounter: Payer: Self-pay | Admitting: Family

## 2016-07-18 ENCOUNTER — Telehealth: Payer: Self-pay | Admitting: Family

## 2016-07-18 ENCOUNTER — Ambulatory Visit (INDEPENDENT_AMBULATORY_CARE_PROVIDER_SITE_OTHER): Payer: Medicare Other | Admitting: Family

## 2016-07-18 VITALS — BP 145/82 | HR 90 | Temp 97.0°F | Ht 66.0 in | Wt 187.6 lb

## 2016-07-18 DIAGNOSIS — N3001 Acute cystitis with hematuria: Secondary | ICD-10-CM

## 2016-07-18 DIAGNOSIS — R300 Dysuria: Secondary | ICD-10-CM

## 2016-07-18 LAB — URINALYSIS, COMPLETE
Bilirubin, UA: NEGATIVE
GLUCOSE, UA: NEGATIVE
KETONES UA: NEGATIVE
Leukocytes, UA: NEGATIVE
NITRITE UA: NEGATIVE
Protein, UA: NEGATIVE
Specific Gravity, UA: <1.005 — ABNORMAL LOW (ref 1.005–1.030)
UUROB: 0.2 mg/dL (ref 0.2–1.0)
pH, UA: 6 (ref 5.0–7.5)

## 2016-07-18 LAB — MICROSCOPIC EXAMINATION: Renal Epithel, UA: NONE SEEN /hpf

## 2016-07-18 MED ORDER — NITROFURANTOIN MONOHYD MACRO 100 MG PO CAPS: 100.0000 mg | ORAL_CAPSULE | Freq: Two times a day (BID) | ORAL | 0 refills | Status: DC

## 2016-07-18 MED ORDER — CIPROFLOXACIN HCL 500 MG PO TABS: 500.0000 mg | ORAL_TABLET | Freq: Two times a day (BID) | ORAL | 0 refills | Status: DC

## 2016-07-18 NOTE — Addendum Note (Signed)
Addended by: Jannifer RodneyHAWKS, Real Cona A on: 07/18/2016 02:18 PM   Modules accepted: Orders

## 2016-07-18 NOTE — Progress Notes (Signed)
   Subjective:    Patient ID: Abigail Moss, female    DOB: [birth year], 71 y.o.   MRN: [id number]  Dysuria   The problem occurs intermittently. The problem has been gradually worsening. The pain is at a severity of 0/10. The patient is experiencing no pain. Associated symptoms include flank pain, frequency and urgency. Pertinent negatives include no hematuria, hesitancy, nausea or vomiting. The treatment provided mild relief.  Back Pain  This is a new problem. Associated symptoms include dysuria.      Review of Systems  Gastrointestinal: Negative for nausea and vomiting.  Genitourinary: Positive for dysuria, flank pain, frequency and urgency. Negative for hematuria and hesitancy.  Musculoskeletal: Positive for back pain.  All other systems reviewed and are negative.      Objective:   Physical Exam  Constitutional: She is oriented to person, place, and time. She appears well-developed and well-nourished. No distress.  HENT:  Head: Normocephalic.  Eyes: Pupils are equal, round, and reactive to light.  Cardiovascular: Normal rate, regular rhythm, normal heart sounds and intact distal pulses.   No murmur heard. Pulmonary/Chest: Effort normal and breath sounds normal. No respiratory distress. She has no wheezes.  Abdominal: Soft. Bowel sounds are normal. She exhibits no distension. There is tenderness (mild lower abd).  Musculoskeletal: Normal range of motion. She exhibits no edema or tenderness.  Neurological: She is alert and oriented to person, place, and time.  Skin: Skin is warm and dry.  Psychiatric: She has a normal mood and affect. Her behavior is normal. Judgment and thought content normal.  Vitals reviewed.   BP (!) 145/82   Pulse 90   Temp 97 F (36.1 C) (Oral)   Ht 5\' 6"  (1.676 m)   Wt 187 lb 9.6 oz (85.1 kg)   BMI 30.28 kg/m        Assessment & Plan:  1. Dysuria - Urinalysis, Complete  2. Acute cystitis with hematuria Force fluids AZO over the counter X2  days RTO prn Culture pending - nitrofurantoin, macrocrystal-monohydrate, (MACROBID) 100 MG capsule; Take 1 capsule (100 mg total) by mouth 2 (two) times daily.  Dispense: 10 capsule; Refill: 0 - Urine culture  Jannifer Rodneyhristy [name], FNP

## 2016-07-18 NOTE — Telephone Encounter (Signed)
Macrobid too high. Patient would like something else called in. Please advise.

## 2016-07-18 NOTE — Telephone Encounter (Signed)
cipro Prescription sent to pharmacy   

## 2016-07-18 NOTE — Patient Instructions (Signed)
Asymptomatic Bacteriuria, Female  Asymptomatic bacteriuria is the presence of a large number of bacteria in your urine without the usual symptoms of burning or frequent urination. The following conditions increase the risk of asymptomatic bacteriuria:   Diabetes mellitus.   Advanced age.   Pregnancy in the first trimester.   Kidney stones.   Kidney transplants.   Leaky kidney tube valve in young children (reflux).  Treatment for this condition is not needed in most people and can lead to other problems such as too much yeast and growth of resistant bacteria. However, some people, such as pregnant women, do need treatment to prevent kidney infection. Asymptomatic bacteriuria in pregnancy is also associated with fetal growth restriction, premature labor, and newborn death.  HOME CARE INSTRUCTIONS  Monitor your condition for any changes. The following actions may help to relieve any discomfort you are feeling:   Drink enough water and fluids to keep your urine clear or pale yellow. Go to the bathroom more often to keep your bladder empty.   Keep the area around your vagina and rectum clean. Wipe yourself from front to back after urinating.  SEEK IMMEDIATE MEDICAL CARE IF:   You develop signs of an infection such as:    Burning with urination.    Frequency of voiding.    Back pain.    Fever.   You have blood in the urine.   You develop a fever.  MAKE SURE YOU:   Understand these instructions.   Will watch your condition.   Will get help right away if you are not doing well or get worse.     This information is not intended to replace advice given to you by your health care provider. Make sure you discuss any questions you have with your health care provider.     Document Released: 06/23/2005 Document Revised: 07/14/2014 Document Reviewed: 12/13/2012  Elsevier Interactive Patient Education 2017 Elsevier Inc.

## 2016-07-20 LAB — URINE CULTURE

## 2016-07-28 ENCOUNTER — Ambulatory Visit (INDEPENDENT_AMBULATORY_CARE_PROVIDER_SITE_OTHER): Payer: Medicare Other | Admitting: Nurse Practitioner

## 2016-07-28 ENCOUNTER — Encounter: Payer: Self-pay | Admitting: Nurse Practitioner

## 2016-07-28 VITALS — BP 132/84 | HR 79 | Temp 97.0°F | Ht 66.0 in | Wt 186.0 lb

## 2016-07-28 DIAGNOSIS — M545 Low back pain, unspecified: Secondary | ICD-10-CM

## 2016-07-28 MED ORDER — METHYLPREDNISOLONE ACETATE 80 MG/ML IJ SUSP
80.0000 mg | Freq: Once | INTRAMUSCULAR | Status: AC
  Administered 2016-07-28: 80 mg via INTRAMUSCULAR

## 2016-07-28 MED ORDER — MELOXICAM 15 MG PO TABS: 15.0000 mg | ORAL_TABLET | Freq: Every day | ORAL | 0 refills | Status: DC

## 2016-07-28 NOTE — Patient Instructions (Signed)
Back Pain, Adult Introduction Back pain is very common. The pain often gets better over time. The cause of back pain is usually not dangerous. Most people can learn to manage their back pain on their own. Follow these instructions at home: Watch your back pain for any changes. The following actions may help to lessen any pain you are feeling:  Stay active. Start with short walks on flat ground if you can. Try to walk farther each day.  Exercise regularly as told by your doctor. Exercise helps your back heal faster. It also helps avoid future injury by keeping your muscles strong and flexible.  Do not sit, drive, or stand in one place for more than 30 minutes.  Do not stay in bed. Resting more than 1-2 days can slow down your recovery.  Be careful when you bend or lift an object. Use good form when lifting:  Bend at your knees.  Keep the object close to your body.  Do not twist.  Sleep on a firm mattress. Lie on your side, and bend your knees. If you lie on your back, put a pillow under your knees.  Take medicines only as told by your doctor.  Put ice on the injured area.  Put ice in a plastic bag.  Place a towel between your skin and the bag.  Leave the ice on for 20 minutes, 2-3 times a day for the first 2-3 days. After that, you can switch between ice and heat packs.  Avoid feeling anxious or stressed. Find good ways to deal with stress, such as exercise.  Maintain a healthy weight. Extra weight puts stress on your back. Contact a doctor if:  You have pain that does not go away with rest or medicine.  You have worsening pain that goes down into your legs or buttocks.  You have pain that does not get better in one week.  You have pain at night.  You lose weight.  You have a fever or chills. Get help right away if:  You cannot control when you poop (bowel movement) or pee (urinate).  Your arms or legs feel weak.  Your arms or legs lose feeling  (numbness).  You feel sick to your stomach (nauseous) or throw up (vomit).  You have belly (abdominal) pain.  You feel like you may pass out (faint). This information is not intended to replace advice given to you by your health care provider. Make sure you discuss any questions you have with your health care provider. Document Released: 12/10/2007 Document Revised: 11/29/2015 Document Reviewed: 10/25/2013  2017 Elsevier  

## 2016-07-28 NOTE — Progress Notes (Signed)
   Subjective:    Patient ID: Abigail SprangRosa S Penna, female    DOB: July 22, 1945, 71 y.o.   MRN: [id number]  HPI  Patient in today c/o ow back pain that started 3 weeks ago- she denies any injury. Rates pain 5/10 currently. Says that getting in and out of car causes the worse pain. Pain does not radiate down either leg. SHe has been using ice on it at home and resting which has helped some.   Review of Systems  Constitutional: Negative.   HENT: Negative.   Respiratory: Negative.   Cardiovascular: Negative.   Musculoskeletal: Positive for back pain.  Neurological: Negative.   Psychiatric/Behavioral: Negative.   All other systems reviewed and are negative.      Objective:   Physical Exam  Constitutional: She is oriented to person, place, and time. She appears well-nourished.  Cardiovascular: Normal rate and regular rhythm.   Pulmonary/Chest: Effort normal.  Musculoskeletal:  Rises slowly from sitting position Pain with full flexion and slight extension Negative SLR bil Motor strength and sensation distally intact  Neurological: She is alert and oriented to person, place, and time. She has normal reflexes.  Skin: Skin is warm.  Psychiatric: She has a normal mood and affect. Her behavior is normal. Judgment and thought content normal.   BP 132/84   Pulse 79   Temp 97 F (36.1 C) (Oral)   Ht 5\' 6"  (1.676 m)   Wt 186 lb (84.4 kg)   BMI 30.02 kg/m         Assessment & Plan:   1. Acute midline low back pain without sciatica    Meds ordered this encounter  Medications  . methylPREDNISolone acetate (DEPO-MEDROL) injection 80 mg  . meloxicam (MOBIC) 15 MG tablet    Sig: Take 1 tablet (15 mg total) by mouth daily.    Dispense:  30 tablet    Refill:  0    Order Specific Question:   Supervising Provider    Answer:   Oswaldo DoneVINCENT, CAROL L [[id number]]   Do not start mobic until tomorrow Moist heat to back No heavy lifting RTO prn  Mary-Margaret Daphine DeutscherMartin, FNP

## 2016-08-05 ENCOUNTER — Ambulatory Visit: Payer: Medicare Other | Admitting: Family

## 2016-08-05 ENCOUNTER — Ambulatory Visit (INDEPENDENT_AMBULATORY_CARE_PROVIDER_SITE_OTHER): Payer: Medicare Other | Admitting: Family

## 2016-08-05 ENCOUNTER — Encounter: Payer: Self-pay | Admitting: Family

## 2016-08-05 VITALS — BP 135/82 | HR 72 | Temp 97.5°F | Ht 66.0 in | Wt 187.0 lb

## 2016-08-05 DIAGNOSIS — M5441 Lumbago with sciatica, right side: Secondary | ICD-10-CM | POA: Diagnosis not present

## 2016-08-05 MED ORDER — CYCLOBENZAPRINE HCL 5 MG PO TABS: 5.0000 mg | ORAL_TABLET | Freq: Three times a day (TID) | ORAL | 0 refills | Status: DC | PRN

## 2016-08-05 NOTE — Patient Instructions (Signed)

## 2016-08-05 NOTE — Progress Notes (Signed)
   Subjective:    Patient ID: Abigail Moss S Moss, female    DOB: 19-Dec-1945, 71 y.o.   MRN: [id number]  Back Pain  This is a new problem. The current episode started 1 to 4 weeks ago. The problem occurs intermittently. The problem has been gradually worsening since onset. The pain is present in the lumbar spine. The quality of the pain is described as aching. The pain does not radiate. The pain is at a severity of 3/10. The pain is mild. The symptoms are aggravated by lying down and standing. Associated symptoms include leg pain. Pertinent negatives include no bladder incontinence, bowel incontinence, dysuria, tingling or weakness. Risk factors include recent trauma (fell on her back). She has tried bed rest, muscle relaxant and NSAIDs for the symptoms. The treatment provided mild relief.      Review of Systems  Gastrointestinal: Negative for bowel incontinence.  Genitourinary: Negative for bladder incontinence and dysuria.  Musculoskeletal: Positive for back pain.  Neurological: Negative for tingling and weakness.  All other systems reviewed and are negative.      Objective:   Physical Exam  Constitutional: She is oriented to person, place, and time. She appears well-developed and well-nourished. No distress.  HENT:  Head: Normocephalic.  Eyes: Pupils are equal, round, and reactive to light.  Neck: Normal range of motion. Neck supple. No thyromegaly present.  Cardiovascular: Normal rate, regular rhythm, normal heart sounds and intact distal pulses.   No murmur heard. Pulmonary/Chest: Effort normal and breath sounds normal. No respiratory distress. She has no wheezes.  Abdominal: Soft. Bowel sounds are normal. She exhibits no distension. There is no tenderness.  Musculoskeletal: Normal range of motion. She exhibits no edema or tenderness.  Rises slowly from sitting position Pain with  flexion Negative straight leg raise  Neurological: She is alert and oriented to person, place, and time.    Skin: Skin is warm and dry.  Psychiatric: She has a normal mood and affect. Her behavior is normal. Judgment and thought content normal.  Vitals reviewed.     BP 135/82   Pulse 72   Temp 97.5 F (36.4 C) (Oral)   Ht 5\' 6"  (1.676 m)   Wt 187 lb (84.8 kg)   BMI 30.18 kg/m      Assessment & Plan:  1. Acute bilateral low back pain with right-sided sciatica -Rest -Ice and heat as needed -Continue mobic -Flexeril as needed- sedation precaution discussed RTO prn  - cyclobenzaprine (FLEXERIL) 5 MG tablet; Take 1 tablet (5 mg total) by mouth 3 (three) times daily as needed for muscle spasms.  Dispense: 60 tablet; Refill: 0   Jannifer Rodneyhristy Carmita Boom, FNP

## 2016-08-22 ENCOUNTER — Ambulatory Visit (INDEPENDENT_AMBULATORY_CARE_PROVIDER_SITE_OTHER): Payer: Medicare Other | Admitting: Family Medicine

## 2016-08-22 ENCOUNTER — Encounter: Payer: Self-pay | Admitting: Family Medicine

## 2016-08-22 ENCOUNTER — Encounter (INDEPENDENT_AMBULATORY_CARE_PROVIDER_SITE_OTHER): Payer: Self-pay

## 2016-08-22 VITALS — BP 119/81 | HR 71 | Temp 97.3°F | Ht 66.0 in | Wt 185.8 lb

## 2016-08-22 DIAGNOSIS — J0101 Acute recurrent maxillary sinusitis: Secondary | ICD-10-CM | POA: Diagnosis not present

## 2016-08-22 MED ORDER — AZITHROMYCIN 250 MG PO TABS: ORAL_TABLET | ORAL | 0 refills | Status: DC

## 2016-08-22 NOTE — Patient Instructions (Signed)
Great to meet you!  Be sure to finish all antibiotics  Come back with any questions.    Sinusitis, Adult Sinusitis is soreness and inflammation of your sinuses. Sinuses are hollow spaces in the bones around your face. They are located:  Around your eyes.  In the middle of your forehead.  Behind your nose.  In your cheekbones. Your sinuses and nasal passages are lined with a stringy fluid (mucus). Mucus normally drains out of your sinuses. When your nasal tissues get inflamed or swollen, the mucus can get trapped or blocked so air cannot flow through your sinuses. This lets bacteria, viruses, and funguses grow, and that leads to infection. Follow these instructions at home: Medicines  Take, use, or apply over-the-counter and prescription medicines only as told by your doctor. These may include nasal sprays.  If you were prescribed an antibiotic medicine, take it as told by your doctor. Do not stop taking the antibiotic even if you start to feel better. Hydrate and Humidify  Drink enough water to keep your pee (urine) clear or pale yellow.  Use a cool mist humidifier to keep the humidity level in your home above 50%.  Breathe in steam for 10-15 minutes, 3-4 times a day or as told by your doctor. You can do this in the bathroom while a hot shower is running.  Try not to spend time in cool or dry air. Rest  Rest as much as possible.  Sleep with your head raised (elevated).  Make sure to get enough sleep each night. General instructions  Put a warm, moist washcloth on your face 3-4 times a day or as told by your doctor. This will help with discomfort.  Wash your hands often with soap and water. If there is no soap and water, use hand sanitizer.  Do not smoke. Avoid being around people who are smoking (secondhand smoke).  Keep all follow-up visits as told by your doctor. This is important. Contact a doctor if:  You have a fever.  Your symptoms get worse.  Your  symptoms do not get better within 10 days. Get help right away if:  You have a very bad headache.  You cannot stop throwing up (vomiting).  You have pain or swelling around your face or eyes.  You have trouble seeing.  You feel confused.  Your neck is stiff.  You have trouble breathing. This information is not intended to replace advice given to you by your health care provider. Make sure you discuss any questions you have with your health care provider. Document Released: 12/10/2007 Document Revised: 02/17/2016 Document Reviewed: 04/18/2015 Elsevier Interactive Patient Education  2017 [employer]

## 2016-08-22 NOTE — Progress Notes (Signed)
   HPI  Patient presents today symptoms of facial pressure and maxillary area, cough productive of thick white sputum, nasal congestion, and frontal headache.  She also ate she's had one day of malaise.  She's tried Mucinex without improvement.  We discussed antibiotics, patient states that she has the best reaction to azithromycin, she requests this as well as prednisone.  Patient states her last prednisone course was approximately 2 months ago,  PMH: Smoking status noted ROS: Per HPI  Objective: BP 119/81   Pulse 71   Temp 97.3 F (36.3 C) (Oral)   Ht 5\' 6"  (1.676 m)   Wt 185 lb 12.8 oz (84.3 kg)   BMI 29.99 kg/m  Gen: NAD, alert, cooperative with exam HEENT: NCAT, pharynx moist and clear, TMs normal bilaterally, nares clear, tenderness to palpation of bilateral maxillary sinus CV: RRR, good S1/S2, no murmur Resp: CTABL, no wheezes, non-labored Ext: No edema, warm Neuro: Alert and oriented, No gross deficits  Assessment and plan:  # Acute maxillary sinusitis Treat with azithromycin, discussed that this is nottechnically good coverage but the pt states that it is affordable and effective for her Follow with ceftin or augmentin if needed RTC with any concerns No prednisone today- osteoporosis   Meds ordered this encounter  Medications  . azithromycin (ZITHROMAX) 250 MG tablet    Sig: Take 2 tablets on day 1 and 1 tablet daily after that    Dispense:  6 tablet    Refill:  0    Murtis SinkSam Bradshaw, MD [facility] 08/22/2016, 10:09 AM

## 2016-08-24 ENCOUNTER — Other Ambulatory Visit: Payer: Self-pay | Admitting: Nurse Practitioner

## 2016-08-24 DIAGNOSIS — M545 Low back pain, unspecified: Secondary | ICD-10-CM

## 2016-09-10 ENCOUNTER — Other Ambulatory Visit: Payer: Self-pay | Admitting: Family Medicine

## 2016-09-10 DIAGNOSIS — K219 Gastro-esophageal reflux disease without esophagitis: Secondary | ICD-10-CM

## 2016-10-02 ENCOUNTER — Ambulatory Visit (INDEPENDENT_AMBULATORY_CARE_PROVIDER_SITE_OTHER): Payer: Medicare Other | Admitting: Family

## 2016-10-02 ENCOUNTER — Encounter: Payer: Self-pay | Admitting: Family

## 2016-10-02 VITALS — BP 120/80 | HR 71 | Temp 97.3°F | Ht 66.0 in | Wt 189.0 lb

## 2016-10-02 DIAGNOSIS — K219 Gastro-esophageal reflux disease without esophagitis: Secondary | ICD-10-CM

## 2016-10-02 DIAGNOSIS — E663 Overweight: Secondary | ICD-10-CM | POA: Diagnosis not present

## 2016-10-02 DIAGNOSIS — I10 Essential (primary) hypertension: Secondary | ICD-10-CM

## 2016-10-02 DIAGNOSIS — J301 Allergic rhinitis due to pollen: Secondary | ICD-10-CM | POA: Diagnosis not present

## 2016-10-02 DIAGNOSIS — M810 Age-related osteoporosis without current pathological fracture: Secondary | ICD-10-CM

## 2016-10-02 DIAGNOSIS — F411 Generalized anxiety disorder: Secondary | ICD-10-CM | POA: Diagnosis not present

## 2016-10-02 DIAGNOSIS — R5383 Other fatigue: Secondary | ICD-10-CM

## 2016-10-02 DIAGNOSIS — E559 Vitamin D deficiency, unspecified: Secondary | ICD-10-CM | POA: Diagnosis not present

## 2016-10-02 DIAGNOSIS — E039 Hypothyroidism, unspecified: Secondary | ICD-10-CM

## 2016-10-02 NOTE — Patient Instructions (Signed)

## 2016-10-02 NOTE — Progress Notes (Signed)
Subjective:    Patient ID: Abigail Moss, female    DOB: [birth year], 71 y.o.   MRN: [id number]  PT presents to the office today for chronic follow up. PT complaining of complaints of fatigue over the last week.  Hypertension  This is a chronic problem. The current episode started more than 1 year ago. The problem has been resolved since onset. The problem is controlled. Associated symptoms include anxiety. Pertinent negatives include no headaches, palpitations, peripheral edema or shortness of breath. Risk factors for coronary artery disease include family history, obesity and post-menopausal state. Past treatments include angiotensin blockers. The current treatment provides moderate improvement. There is no history of kidney disease, CAD/MI, CVA or heart failure. Identifiable causes of hypertension include a thyroid problem. There is no history of sleep apnea.  Thyroid Problem  Presents for follow-up visit. Symptoms include fatigue. Patient reports no anxiety, depressed mood, diarrhea, dry skin, hair loss or palpitations. The symptoms have been stable. Past treatments include levothyroxine. The treatment provided significant relief. There is no history of heart failure.  Gastroesophageal Reflux  She reports no belching, no coughing, no heartburn or no sore throat. This is a chronic problem. The current episode started more than 1 year ago. The problem occurs rarely. The problem has been resolved. The symptoms are aggravated by certain foods. Associated symptoms include fatigue. She has tried a PPI for the symptoms. The treatment provided significant relief.  Anxiety  Presents for follow-up visit. Onset was more than 5 years ago. The problem has been waxing and waning. Patient reports no depressed mood, excessive worry, irritability, nervous/anxious behavior, palpitations or shortness of breath. The severity of symptoms is mild.   Her past medical history is significant for anxiety/panic attacks. Past  treatments include benzodiazephines and SSRIs. The treatment provided significant relief.  Osteoporosis PT states she took fosamax 70 mg weekly, calcium, and vit D. Pt's last Dexa Scan was 11/21/14    Review of Systems  Constitutional: Positive for fatigue. Negative for irritability.  HENT: Negative.  Negative for sore throat.   Eyes: Negative.   Respiratory: Negative.  Negative for cough and shortness of breath.   Cardiovascular: Negative.  Negative for palpitations.  Gastrointestinal: Negative.  Negative for diarrhea and heartburn.  Endocrine: Negative.   Genitourinary: Negative.   Musculoskeletal: Negative.   Neurological: Negative.  Negative for headaches.  Hematological: Negative.   Psychiatric/Behavioral: Negative.  The patient is not nervous/anxious.   All other systems reviewed and are negative.      Objective:   Physical Exam  Constitutional: She is oriented to person, place, and time. She appears well-developed and well-nourished. No distress.  HENT:  Head: Normocephalic and atraumatic.  Right Ear: External ear normal.  Left Ear: External ear normal.  Nose: Nose normal.  Mouth/Throat: Oropharynx is clear and moist.  Eyes: Pupils are equal, round, and reactive to light.  Neck: Normal range of motion. Neck supple. No thyromegaly present.  Cardiovascular: Normal rate, regular rhythm, normal heart sounds and intact distal pulses.   No murmur heard. Pulmonary/Chest: Effort normal and breath sounds normal. No respiratory distress. She has no wheezes.  Abdominal: Soft. Bowel sounds are normal. She exhibits no distension. There is no tenderness.  Musculoskeletal: Normal range of motion. She exhibits no edema or tenderness.  Neurological: She is alert and oriented to person, place, and time. She has normal reflexes. No cranial nerve deficit.  Skin: Skin is warm and dry.  Psychiatric: She has a normal mood and affect.  Her behavior is normal. Judgment and thought content  normal.  Vitals reviewed.    BP 120/80   Pulse 71   Temp 97.3 F (36.3 C) (Oral)   Ht '5\' 6"'  (1.676 m)   Wt 189 lb (85.7 kg)   BMI 30.51 kg/m       Assessment & Plan:  1. Essential hypertension - CMP14+EGFR  2. Seasonal allergic rhinitis due to pollen, unspecified chronicity - CMP14+EGFR  3. GAD (generalized anxiety disorder) - CMP14+EGFR  4. Overweight (BMI 25.0-29.9) - CMP14+EGFR  5. Vitamin D deficiency - CMP14+EGFR  6. Osteoporosis, unspecified osteoporosis type, unspecified pathological fracture presence - CMP14+EGFR  7. Gastroesophageal reflux disease, esophagitis presence not specified - CMP14+EGFR  8. Hypothyroidism, unspecified type  - CMP14+EGFR - Thyroid Panel With TSH  9. Other fatigue - CMP14+EGFR - Thyroid Panel With TSH - Anemia Profile B   Continue all meds Labs pending Health Maintenance reviewed Diet and exercise encouraged RTO 6 months   [name], FNP

## 2016-10-03 LAB — ANEMIA PROFILE B
BASOS ABS: 0.1 10*3/uL (ref 0.0–0.2)
Basos: 1 %
EOS (ABSOLUTE): 0.2 10*3/uL (ref 0.0–0.4)
Eos: 3 %
FERRITIN: 41 ng/mL (ref 15–150)
Folate: >20 ng/mL (ref >3.0)
HEMOGLOBIN: 12.3 g/dL (ref 11.1–15.9)
Hematocrit: 37.1 % (ref 34.0–46.6)
IMMATURE GRANULOCYTES: 0 %
IRON SATURATION: 27 % (ref 15–55)
Immature Grans (Abs): 0 10*3/uL (ref 0.0–0.1)
Iron: 75 ug/dL (ref 27–139)
LYMPHS ABS: 1.4 10*3/uL (ref 0.7–3.1)
Lymphs: 27 %
MCH: 31.3 pg (ref 26.6–33.0)
MCHC: 33.2 g/dL (ref 31.5–35.7)
MCV: 94 fL (ref 79–97)
MONOS ABS: 0.4 10*3/uL (ref 0.1–0.9)
Monocytes: 7 %
NEUTROS PCT: 62 %
Neutrophils Absolute: 3.2 10*3/uL (ref 1.4–7.0)
PLATELETS: 258 10*3/uL (ref 150–379)
RBC: 3.93 x10E6/uL (ref 3.77–5.28)
RDW: 13.6 % (ref 12.3–15.4)
Retic Ct Pct: 1.2 % (ref 0.6–2.6)
Total Iron Binding Capacity: 273 ug/dL (ref 250–450)
UIBC: 198 ug/dL (ref 118–369)
Vitamin B-12: 540 pg/mL (ref 232–1245)
WBC: 5.3 10*3/uL (ref 3.4–10.8)

## 2016-10-03 LAB — CMP14+EGFR
A/G RATIO: 1.7 (ref 1.2–2.2)
ALT: 18 IU/L (ref 0–32)
AST: 24 IU/L (ref 0–40)
Albumin: 4.2 g/dL (ref 3.5–4.8)
Alkaline Phosphatase: 73 IU/L (ref 39–117)
BUN/Creatinine Ratio: 24 (ref 12–28)
BUN: 18 mg/dL (ref 8–27)
Bilirubin Total: 0.3 mg/dL (ref 0.0–1.2)
CALCIUM: 9.5 mg/dL (ref 8.7–10.3)
CO2: 26 mmol/L (ref 18–29)
CREATININE: 0.76 mg/dL (ref 0.57–1.00)
Chloride: 102 mmol/L (ref 96–106)
GFR calc Af Amer: 91 mL/min/{1.73_m2} (ref >59)
GFR, EST NON AFRICAN AMERICAN: 79 mL/min/{1.73_m2} (ref >59)
GLUCOSE: 83 mg/dL (ref 65–99)
Globulin, Total: 2.5 g/dL (ref 1.5–4.5)
POTASSIUM: 4.1 mmol/L (ref 3.5–5.2)
Sodium: 143 mmol/L (ref 134–144)
TOTAL PROTEIN: 6.7 g/dL (ref 6.0–8.5)

## 2016-10-03 LAB — THYROID PANEL WITH TSH
FREE THYROXINE INDEX: 2 (ref 1.2–4.9)
T3 UPTAKE RATIO: 26 % (ref 24–39)
T4 TOTAL: 7.7 ug/dL (ref 4.5–12.0)
TSH: 5.39 u[IU]/mL — ABNORMAL HIGH (ref 0.450–4.500)

## 2016-10-06 ENCOUNTER — Other Ambulatory Visit: Payer: Self-pay | Admitting: Family

## 2016-10-06 MED ORDER — LEVOTHYROXINE SODIUM 125 MCG PO TABS: 125.0000 ug | ORAL_TABLET | Freq: Every day | ORAL | 1 refills | Status: DC

## 2016-10-08 ENCOUNTER — Other Ambulatory Visit: Payer: Self-pay | Admitting: Family

## 2016-10-08 DIAGNOSIS — J302 Other seasonal allergic rhinitis: Secondary | ICD-10-CM

## 2016-10-19 ENCOUNTER — Other Ambulatory Visit: Payer: Self-pay | Admitting: Family Medicine

## 2016-10-19 DIAGNOSIS — F411 Generalized anxiety disorder: Secondary | ICD-10-CM

## 2016-10-31 ENCOUNTER — Ambulatory Visit (INDEPENDENT_AMBULATORY_CARE_PROVIDER_SITE_OTHER): Payer: Medicare Other | Admitting: Family

## 2016-10-31 ENCOUNTER — Encounter: Payer: Self-pay | Admitting: Family

## 2016-10-31 ENCOUNTER — Other Ambulatory Visit: Payer: Self-pay | Admitting: Family

## 2016-10-31 VITALS — BP 126/86 | HR 83 | Temp 97.2°F | Ht 66.0 in | Wt 191.8 lb

## 2016-10-31 DIAGNOSIS — J329 Chronic sinusitis, unspecified: Secondary | ICD-10-CM

## 2016-10-31 DIAGNOSIS — J301 Allergic rhinitis due to pollen: Secondary | ICD-10-CM | POA: Diagnosis not present

## 2016-10-31 MED ORDER — AMOXICILLIN-POT CLAVULANATE 875-125 MG PO TABS: 1.0000 | ORAL_TABLET | Freq: Two times a day (BID) | ORAL | 0 refills | Status: DC

## 2016-10-31 NOTE — Progress Notes (Signed)
   Subjective:    Patient ID: Abigail Moss, female    DOB: 03-23-1946, 71 y.o.   MRN: [id number]  Sinus Problem  This is a recurrent problem. The current episode started in the past 7 days. The problem has been waxing and waning since onset. There has been no fever. Her pain is at a severity of 2/10. She is experiencing no pain. Associated symptoms include congestion, coughing, ear pain, headaches, sinus pressure and a sore throat. Pertinent negatives include no hoarse voice or sneezing. Past treatments include oral decongestants, spray decongestants and saline sprays. The treatment provided mild relief.      Review of Systems  HENT: Positive for congestion, ear pain, sinus pressure and sore throat. Negative for hoarse voice and sneezing.   Respiratory: Positive for cough.   Neurological: Positive for headaches.  All other systems reviewed and are negative.      Objective:   Physical Exam  Constitutional: She is oriented to person, place, and time. She appears well-developed and well-nourished. No distress.  HENT:  Head: Normocephalic and atraumatic.  Right Ear: There is drainage (tubes present).  Left Ear: There is drainage (tubes present) and swelling.  Nose: Mucosal edema and rhinorrhea present. Right sinus exhibits maxillary sinus tenderness. Left sinus exhibits maxillary sinus tenderness.  Mouth/Throat: Posterior oropharyngeal erythema present.  Eyes: Pupils are equal, round, and reactive to light.  Neck: Normal range of motion. Neck supple. No thyromegaly present.  Cardiovascular: Normal rate, regular rhythm, normal heart sounds and intact distal pulses.   No murmur heard. Pulmonary/Chest: Effort normal and breath sounds normal. No respiratory distress. She has no wheezes.  Abdominal: Soft. Bowel sounds are normal. She exhibits no distension. There is no tenderness.  Musculoskeletal: Normal range of motion. She exhibits no edema or tenderness.  Neurological: She is alert and  oriented to person, place, and time. She has normal reflexes. No cranial nerve deficit.  Skin: Skin is warm and dry.  Psychiatric: She has a normal mood and affect. Her behavior is normal. Judgment and thought content normal.  Vitals reviewed.     BP 126/86   Pulse 83   Temp 97.2 F (36.2 C) (Oral)   Ht  (1.676 m)   Wt 191 lb 12.8 oz (87 kg)   BMI 30.96 kg/m      Assessment & Plan:  1. Seasonal allergic rhinitis due to pollen - Ambulatory referral to ENT  2. Chronic recurrent sinusitis - Take meds as prescribed - Use a cool mist humidifier  -Use saline nose sprays frequently -Saline irrigations of the nose can be very helpful if done frequently.  * 4X daily for 1 week*  * Use of a nettie pot can be helpful with this. Follow directions with this* -Force fluids -For any cough or congestion  Use plain Mucinex- regular strength or max strength is fine   * Children- consult with Pharmacist for dosing -For fever or aces or pains- take tylenol or ibuprofen appropriate for age and weight.  * for fevers greater than 101 orally you may alternate ibuprofen and tylenol every  3 hours. -Throat lozenges if he - Ambulatory referral to ENT  Continue singulair, flonase, and mucinex Long discussion with patient about this probably related to viral or allergies. Pt worried symptoms will worsen during the weekend. I will send in Augmentin, but pt not to start until Monday or uJENINE KRISHERptoms continue to worsen.     Jannifer Rodney, FNP

## 2016-10-31 NOTE — Patient Instructions (Signed)

## 2016-11-24 ENCOUNTER — Ambulatory Visit (INDEPENDENT_AMBULATORY_CARE_PROVIDER_SITE_OTHER): Payer: Medicare Other | Admitting: Family

## 2016-11-24 ENCOUNTER — Encounter: Payer: Self-pay | Admitting: Family

## 2016-11-24 ENCOUNTER — Telehealth: Payer: Self-pay | Admitting: *Deleted

## 2016-11-24 VITALS — BP 107/73 | HR 81 | Temp 97.3°F | Ht 66.0 in | Wt 187.0 lb

## 2016-11-24 DIAGNOSIS — R300 Dysuria: Secondary | ICD-10-CM

## 2016-11-24 DIAGNOSIS — N3001 Acute cystitis with hematuria: Secondary | ICD-10-CM

## 2016-11-24 LAB — URINALYSIS, COMPLETE
BILIRUBIN UA: NEGATIVE
Ketones, UA: NEGATIVE
Nitrite, UA: POSITIVE — AB
PH UA: 5 (ref 5.0–7.5)
Protein, UA: NEGATIVE
Specific Gravity, UA: <1.005 — ABNORMAL LOW (ref 1.005–1.030)
Urobilinogen, Ur: 1 mg/dL (ref 0.2–1.0)

## 2016-11-24 LAB — MICROSCOPIC EXAMINATION
Epithelial Cells (non renal): >10 /hpf — AB (ref 0–10)
RENAL EPITHEL UA: NONE SEEN /HPF

## 2016-11-24 MED ORDER — CIPROFLOXACIN HCL 500 MG PO TABS: 500.0000 mg | ORAL_TABLET | Freq: Two times a day (BID) | ORAL | 0 refills | Status: DC

## 2016-11-24 MED ORDER — SULFAMETHOXAZOLE-TRIMETHOPRIM 800-160 MG PO TABS: 1.0000 | ORAL_TABLET | Freq: Two times a day (BID) | ORAL | 0 refills | Status: DC

## 2016-11-24 NOTE — Progress Notes (Signed)
   Subjective:    Patient ID: Abigail SprangRosa S Moss, female    DOB: 1946-03-29, 71 y.o.   MRN: [id number]  Dysuria   The current episode started in the past 7 days. The problem occurs every urination. The problem has been gradually worsening. The pain is at a severity of 0/10. The pain is mild. Associated symptoms include frequency, hesitancy and urgency. Pertinent negatives include no flank pain, hematuria, nausea or vomiting. She has tried increased fluids for the symptoms. The treatment provided mild relief.      Review of Systems  Gastrointestinal: Negative for nausea and vomiting.  Genitourinary: Positive for dysuria, frequency, hesitancy and urgency. Negative for flank pain and hematuria.  All other systems reviewed and are negative.      Objective:   Physical Exam  Constitutional: She is oriented to person, place, and time. She appears well-developed and well-nourished. No distress.  HENT:  Head: Normocephalic.  Eyes: Pupils are equal, round, and reactive to light.  Cardiovascular: Normal rate, regular rhythm, normal heart sounds and intact distal pulses.   No murmur heard. Pulmonary/Chest: Effort normal and breath sounds normal. No respiratory distress. She has no wheezes.  Abdominal: Soft. Bowel sounds are normal. She exhibits no distension. There is no tenderness.  Musculoskeletal: Normal range of motion. She exhibits no edema or tenderness.  Neurological: She is alert and oriented to person, place, and time.  Skin: Skin is warm and dry.  Psychiatric: She has a normal mood and affect. Her behavior is normal. Judgment and thought content normal.  Vitals reviewed.     BP 107/73   Pulse 81   Temp 97.3 F (36.3 C)   Ht 5\' 6"  (1.676 m)   Wt 187 lb (84.8 kg)   BMI 30.18 kg/m      Assessment & Plan:  1. Dysuria - Urinalysis, Complete - Urine culture  2. Acute cystitis with hematuria Force fluids AZO over the counter X2 days RTO prn Culture pending -  sulfamethoxazole-trimethoprim (BACTRIM DS) 800-160 MG tablet; Take 1 tablet by mouth 2 (two) times daily.  Dispense: 14 tablet; Refill: 0   Jannifer Rodneyhristy Shiane Wenberg, FNP

## 2016-11-24 NOTE — Patient Instructions (Signed)
Pyelonephritis, Adult Pyelonephritis is a kidney infection. The kidneys are the organs that filter a person's blood and move waste out of the bloodstream and into the urine. Urine passes from the kidneys, through the ureters, and into the bladder. There are two main types of pyelonephritis:  Infections that come on quickly without any warning (acute pyelonephritis).  Infections that last for a long period of time (chronic pyelonephritis). In most cases, the infection clears up with treatment and does not cause further problems. More severe infections or chronic infections can sometimes spread to the bloodstream or lead to other problems with the kidneys. What are the causes? This condition is usually caused by:  Bacteria traveling from the bladder to the kidney through infected urine. The urine in the bladder can become infected with bacteria from:  Bladder infection (cystitis).  Inflammation of the prostate gland (prostatitis).  Sexual intercourse, in females.  Bacteria traveling from the bloodstream to the kidney. What increases the risk? This condition is more likely to develop in:  Pregnant women.  Older people.  People who have diabetes.  People who have kidney stones or bladder stones.  People who have other abnormalities of the kidney or ureter.  People who have a catheter placed in the bladder.  People who have cancer.  People who are sexually active.  Women who use spermicides.  People who have had a prior urinary tract infection. What are the signs or symptoms? Symptoms of this condition include:  Frequent urination.  Strong or persistent urge to urinate.  Burning or stinging when urinating.  Abdominal pain.  Back pain.  Pain in the side or flank area.  Fever.  Chills.  Blood in the urine, or dark urine.  Nausea.  Vomiting. How is this diagnosed? This condition may be diagnosed based on:  Medical history and physical exam.  Urine  tests.  Blood tests. You may also have imaging tests of the kidneys, such as an ultrasound or CT scan. How is this treated? Treatment for this condition may depend on the severity of the infection.  If the infection is mild and is found early, you may be treated with antibiotic medicines taken by mouth. You will need to drink fluids to remain hydrated.  If the infection is more severe, you may need to stay in the hospital and receive antibiotics given directly into a vein through an IV tube. You may also need to receive fluids through an IV tube if you are not able to remain hydrated. After your hospital stay, you may need to take oral antibiotics for a period of time. Other treatments may be required, depending on the cause of the infection. Follow these instructions at home: Medicines   Take over-the-counter and prescription medicines only as told by your health care provider.  If you were prescribed an antibiotic medicine, take it as told by your health care provider. Do not stop taking the antibiotic even if you start to feel better. General instructions   Drink enough fluid to keep your urine clear or pale yellow.  Avoid caffeine, tea, and carbonated beverages. They tend to irritate the bladder.  Urinate often. Avoid holding in urine for long periods of time.  Urinate before and after sex.  After a bowel movement, women should cleanse from front to back. Use each tissue only once.  Keep all follow-up visits as told by your health care provider. This is important. Contact a health care provider if:  Your symptoms do not get better   after 2 days of treatment.  Your symptoms get worse.  You have a fever. Get help right away if:  You are unable to take your antibiotics or fluids.  You have shaking chills.  You vomit.  You have severe flank or back pain.  You have extreme weakness or fainting. This information is not intended to replace advice given to you by your  health care provider. Make sure you discuss any questions you have with your health care provider. Document Released: 06/23/2005 Document Revised: 11/29/2015 Document Reviewed: 10/16/2014 Elsevier Interactive Patient Education  2017 [employer]  

## 2016-11-24 NOTE — Telephone Encounter (Signed)
Patient allergic to sulfa .  Please send in cipro or different medication that does not have sulfa.

## 2016-11-24 NOTE — Addendum Note (Signed)
Addended by: Jannifer RodneyHAWKS, Barry Culverhouse A on: 11/24/2016 04:37 PM   Modules accepted: Orders

## 2016-11-24 NOTE — Progress Notes (Signed)
   Subjective:    Patient ID: Abigail Moss, female    DOB: 1945/08/31, 71 y.o.   MRN: [id number]  HPI    Review of Systems     Objective:   Physical Exam        Assessment & Plan:

## 2016-11-25 NOTE — Telephone Encounter (Signed)
Cipro Prescription sent to pharmacy   

## 2016-11-27 ENCOUNTER — Other Ambulatory Visit: Payer: Self-pay | Admitting: Family

## 2016-11-27 LAB — URINE CULTURE

## 2016-11-27 MED ORDER — NITROFURANTOIN MONOHYD MACRO 100 MG PO CAPS: 100.0000 mg | ORAL_CAPSULE | Freq: Two times a day (BID) | ORAL | 0 refills | Status: DC

## 2016-12-03 ENCOUNTER — Encounter: Payer: Medicare Other | Admitting: *Deleted

## 2016-12-03 DIAGNOSIS — Z1231 Encounter for screening mammogram for malignant neoplasm of breast: Secondary | ICD-10-CM | POA: Diagnosis not present

## 2016-12-18 ENCOUNTER — Encounter: Payer: Medicare Other | Admitting: Family

## 2016-12-18 ENCOUNTER — Encounter: Payer: Medicare Other | Admitting: Family Medicine

## 2016-12-19 ENCOUNTER — Encounter: Payer: Self-pay | Admitting: Family

## 2016-12-19 ENCOUNTER — Ambulatory Visit (INDEPENDENT_AMBULATORY_CARE_PROVIDER_SITE_OTHER): Payer: Medicare Other | Admitting: Family

## 2016-12-19 ENCOUNTER — Ambulatory Visit (INDEPENDENT_AMBULATORY_CARE_PROVIDER_SITE_OTHER): Payer: Medicare Other

## 2016-12-19 VITALS — BP 143/90 | HR 74 | Temp 97.8°F | Ht 66.0 in | Wt 186.0 lb

## 2016-12-19 DIAGNOSIS — Z87440 Personal history of urinary (tract) infections: Secondary | ICD-10-CM | POA: Diagnosis not present

## 2016-12-19 DIAGNOSIS — E039 Hypothyroidism, unspecified: Secondary | ICD-10-CM

## 2016-12-19 DIAGNOSIS — F411 Generalized anxiety disorder: Secondary | ICD-10-CM | POA: Diagnosis not present

## 2016-12-19 DIAGNOSIS — E669 Obesity, unspecified: Secondary | ICD-10-CM | POA: Diagnosis not present

## 2016-12-19 DIAGNOSIS — E559 Vitamin D deficiency, unspecified: Secondary | ICD-10-CM

## 2016-12-19 DIAGNOSIS — I10 Essential (primary) hypertension: Secondary | ICD-10-CM | POA: Diagnosis not present

## 2016-12-19 DIAGNOSIS — M810 Age-related osteoporosis without current pathological fracture: Secondary | ICD-10-CM

## 2016-12-19 DIAGNOSIS — K219 Gastro-esophageal reflux disease without esophagitis: Secondary | ICD-10-CM

## 2016-12-19 LAB — URINALYSIS, COMPLETE
Bilirubin, UA: NEGATIVE
Glucose, UA: NEGATIVE
KETONES UA: NEGATIVE
NITRITE UA: NEGATIVE
Protein, UA: NEGATIVE
RBC UA: NEGATIVE
SPEC GRAV UA: 1.01 (ref 1.005–1.030)
Urobilinogen, Ur: 0.2 mg/dL (ref 0.2–1.0)
pH, UA: 6 (ref 5.0–7.5)

## 2016-12-19 LAB — MICROSCOPIC EXAMINATION
RBC MICROSCOPIC, UA: NONE SEEN /HPF (ref 0–?)
Renal Epithel, UA: NONE SEEN /hpf

## 2016-12-19 MED ORDER — ALENDRONATE SODIUM 70 MG PO TABS: 70.0000 mg | ORAL_TABLET | ORAL | 11 refills | Status: DC

## 2016-12-19 MED ORDER — HYDROCORTISONE-ACETIC ACID 1-2 % OT SOLN: 4.0000 [drp] | Freq: Four times a day (QID) | OTIC | 2 refills | Status: DC

## 2016-12-19 MED ORDER — ALPRAZOLAM 0.5 MG PO TABS: 0.5000 mg | ORAL_TABLET | Freq: Every evening | ORAL | 3 refills | Status: DC | PRN

## 2016-12-19 MED ORDER — MONTELUKAST SODIUM 10 MG PO TABS: 10.0000 mg | ORAL_TABLET | Freq: Every day | ORAL | 1 refills | Status: DC

## 2016-12-19 NOTE — Addendum Note (Signed)
Addended by: Almeta MonasSTONE, JANIE M on: 12/19/2016 12:35 PM   Modules accepted: Orders

## 2016-12-19 NOTE — Progress Notes (Signed)
Subjective:    Patient ID: Abigail Moss, female    DOB: 1946/04/16, 71 y.o.   MRN: [id number]  PT presents to the office today for chronic follow up.  Hypertension  This is a chronic problem. The current episode started more than 1 year ago. The problem has been resolved since onset. The problem is controlled. Associated symptoms include anxiety. Pertinent negatives include no headaches, peripheral edema or shortness of breath. Risk factors for coronary artery disease include dyslipidemia, obesity, sedentary lifestyle and family history. The current treatment provides mild improvement. There is no history of kidney disease, CAD/MI, CVA or heart failure. Identifiable causes of hypertension include a thyroid problem.  Gastroesophageal Reflux  She reports no belching or no heartburn. This is a chronic problem. The current episode started more than 1 year ago. The problem occurs occasionally. The problem has been waxing and waning. The treatment provided mild relief.  Thyroid Problem  Presents for follow-up visit. Symptoms include anxiety. Patient reports no constipation, diarrhea or leg swelling. The symptoms have been stable. There is no history of heart failure.  Urinary Frequency   This is a recurrent problem. The current episode started in the past 7 days. The problem occurs intermittently. The problem has been waxing and waning. The patient is experiencing no pain. Associated symptoms include frequency. She has tried increased fluids for the symptoms.  Anxiety  Presents for follow-up visit. Symptoms include insomnia and nervous/anxious behavior. Patient reports no shortness of breath. Symptoms occur rarely. The severity of symptoms is mild. The quality of sleep is good.    Osteoporosis  Dexa scan 11/21/14. Pt taking Fosamax, calcium and Vit D    Review of Systems  Respiratory: Negative for shortness of breath.   Gastrointestinal: Negative for constipation, diarrhea and heartburn.    Genitourinary: Positive for frequency.  Neurological: Negative for headaches.  Psychiatric/Behavioral: The patient is nervous/anxious and has insomnia.   All other systems reviewed and are negative.      Objective:   Physical Exam  Constitutional: She is oriented to person, place, and time. She appears well-developed and well-nourished. No distress.  HENT:  Head: Normocephalic and atraumatic.  Right Ear: There is drainage.  Left Ear: External ear normal.  Nose: Nose normal.  Mouth/Throat: Oropharynx is clear and moist.  Bilateral tubes present   Eyes: Pupils are equal, round, and reactive to light.  Neck: Normal range of motion. Neck supple. No thyromegaly present.  Cardiovascular: Normal rate, regular rhythm, normal heart sounds and intact distal pulses.   No murmur heard. Pulmonary/Chest: Effort normal and breath sounds normal. No respiratory distress. She has no wheezes.  Abdominal: Soft. Bowel sounds are normal. She exhibits no distension. There is no tenderness.  Musculoskeletal: Normal range of motion. She exhibits no edema or tenderness.  Neurological: She is alert and oriented to person, place, and time.  Skin: Skin is warm and dry.  Psychiatric: She has a normal mood and affect. Her behavior is normal. Judgment and thought content normal.  Vitals reviewed.     BP (!) 143/90   Pulse 74   Temp 97.8 F (36.6 C) (Oral)   Ht '5\' 6"'  (1.676 m)   Wt 186 lb (84.4 kg)   BMI 30.02 kg/m      Assessment & Plan:  1. History of UTI - Urinalysis, Complete - CMP14+EGFR  2. Essential hypertension - CMP14+EGFR  3. Gastroesophageal reflux disease, esophagitis presence not specified - CMP14+EGFR  4. GAD (generalized anxiety disorder) - CMP14+EGFR -  ALPRAZolam (XANAX) 0.5 MG tablet; Take 1 tablet (0.5 mg total) by mouth at bedtime as needed.  Dispense: 30 tablet; Refill: 3  5. Hypothyroidism, unspecified type - CMP14+EGFR - Thyroid Panel With TSH  6. Obesity (BMI  30-39.9) - CMP14+EGFR  7. Vitamin D deficiency - CMP14+EGFR - VITAMIN D 25 Hydroxy (Vit-D Deficiency, Fractures)  8. Osteoporosis, unspecified osteoporosis type, unspecified pathological fracture presence  - CMP14+EGFR - DG [facility] DEXA   Continue all meds Labs pending Health Maintenance reviewed Diet and exercise encouraged RTO 6 months   [name], FNP

## 2016-12-19 NOTE — Patient Instructions (Signed)

## 2016-12-20 LAB — CMP14+EGFR
ALT: 20 IU/L (ref 0–32)
AST: 21 IU/L (ref 0–40)
Albumin/Globulin Ratio: 1.6 (ref 1.2–2.2)
Albumin: 4.1 g/dL (ref 3.5–4.8)
Alkaline Phosphatase: 60 IU/L (ref 39–117)
BILIRUBIN TOTAL: 0.2 mg/dL (ref 0.0–1.2)
BUN/Creatinine Ratio: 12 (ref 12–28)
BUN: 10 mg/dL (ref 8–27)
CHLORIDE: 106 mmol/L (ref 96–106)
CO2: 26 mmol/L (ref 20–29)
Calcium: 9.3 mg/dL (ref 8.7–10.3)
Creatinine, Ser: 0.81 mg/dL (ref 0.57–1.00)
GFR, EST AFRICAN AMERICAN: 84 mL/min/{1.73_m2} (ref >59)
GFR, EST NON AFRICAN AMERICAN: 73 mL/min/{1.73_m2} (ref >59)
GLUCOSE: 86 mg/dL (ref 65–99)
Globulin, Total: 2.6 g/dL (ref 1.5–4.5)
Potassium: 4.1 mmol/L (ref 3.5–5.2)
Sodium: 144 mmol/L (ref 134–144)
TOTAL PROTEIN: 6.7 g/dL (ref 6.0–8.5)

## 2016-12-20 LAB — THYROID PANEL WITH TSH
Free Thyroxine Index: 2.5 (ref 1.2–4.9)
T3 UPTAKE RATIO: 28 % (ref 24–39)
T4 TOTAL: 8.8 ug/dL (ref 4.5–12.0)
TSH: 1.78 u[IU]/mL (ref 0.450–4.500)

## 2016-12-20 LAB — VITAMIN D 25 HYDROXY (VIT D DEFICIENCY, FRACTURES): Vit D, 25-Hydroxy: 51.7 ng/mL (ref 30.0–100.0)

## 2016-12-25 ENCOUNTER — Encounter: Payer: Medicare Other | Admitting: Family

## 2017-01-02 ENCOUNTER — Other Ambulatory Visit: Payer: Self-pay | Admitting: Family Medicine

## 2017-01-29 ENCOUNTER — Ambulatory Visit (INDEPENDENT_AMBULATORY_CARE_PROVIDER_SITE_OTHER): Payer: Medicare Other | Admitting: Family Medicine

## 2017-01-29 ENCOUNTER — Encounter: Payer: Self-pay | Admitting: Family Medicine

## 2017-01-29 VITALS — BP 125/79 | HR 68 | Temp 98.2°F | Ht 66.0 in | Wt 186.0 lb

## 2017-01-29 DIAGNOSIS — J309 Allergic rhinitis, unspecified: Secondary | ICD-10-CM

## 2017-01-29 MED ORDER — PREDNISONE 20 MG PO TABS: ORAL_TABLET | ORAL | 0 refills | Status: DC

## 2017-01-29 NOTE — Progress Notes (Signed)
BP 125/79   Pulse 68   Temp 98.2 F (36.8 C) (Oral)   Ht 5\' 6"  (1.676 m)   Wt 186 lb (84.4 kg)   BMI 30.02 kg/m    Subjective:    Patient ID: Abigail Moss, female    DOB: [birth year], 71 y.o.   MRN: [id number]  HPI: Abigail SprangRosa S Kearn is a 71 y.o. female presenting on 01/29/2017 for Allergic Rhinitis  (runny nose, scratchy throat x 3-4 days; taking Mucinex & Sudafed) and Ear Pain (right ear)   HPI Sinus congestion and sore throat Patient comes in complaining of sinus congestion and sore throat that is been going on for the past week. She says the drainage started about 6 days ago and she took Mucinex and that improved but now she has a sore scratchy throat and a cough that are both worse in the evening times. She denies any fevers or chills or shortness of breath or wheezing. She does admit that she has a lot of seasonal allergy and mold allergies that trigger her frequently. She denies any sick contacts that she knows of. She is also been taking Sudafed over the past couple days and feels like it is helping some. She is also developed pressure in her right ear along with the scratchiness and sore throat over the past 2-3 days.  Relevant past medical, surgical, family and social history reviewed and updated as indicated. Interim medical history since our last visit reviewed. Allergies and medications reviewed and updated.  Review of Systems  Constitutional: Negative for chills and fever.  HENT: Positive for congestion, ear pain, postnasal drip, rhinorrhea, sinus pressure, sneezing and sore throat. Negative for ear discharge.   Eyes: Negative for pain, redness and visual disturbance.  Respiratory: Positive for cough. Negative for chest tightness, shortness of breath and wheezing.   Cardiovascular: Negative for chest pain and leg swelling.  Genitourinary: Negative for difficulty urinating and dysuria.  Musculoskeletal: Negative for back pain and gait problem.  Skin: Negative for rash.    Neurological: Negative for light-headedness and headaches.  Psychiatric/Behavioral: Negative for agitation and behavioral problems.  All other systems reviewed and are negative.   Per HPI unless specifically indicated above     Objective:    BP 125/79   Pulse 68   Temp 98.2 F (36.8 C) (Oral)   Ht 5\' 6"  (1.676 m)   Wt 186 lb (84.4 kg)   BMI 30.02 kg/m   Wt Readings from Last 3 Encounters:  01/29/17 186 lb (84.4 kg)  12/19/16 186 lb (84.4 kg)  11/24/16 187 lb (84.8 kg)    Physical Exam  Constitutional: She is oriented to person, place, and time. She appears well-developed and well-nourished. No distress.  HENT:  Right Ear: Tympanic membrane, external ear and ear canal normal. No drainage or swelling. Tympanic membrane is not erythematous, not retracted and not bulging.  Left Ear: Tympanic membrane, external ear and ear canal normal. No drainage or swelling. Tympanic membrane is not erythematous, not retracted and not bulging.  Nose: Mucosal edema and rhinorrhea present. No epistaxis. Right sinus exhibits no maxillary sinus tenderness and no frontal sinus tenderness. Left sinus exhibits no maxillary sinus tenderness and no frontal sinus tenderness.  Mouth/Throat: Uvula is midline and mucous membranes are normal. Posterior oropharyngeal edema and posterior oropharyngeal erythema present. No oropharyngeal exudate or tonsillar abscesses.  Patient has permanent blue ET tubes in both the ears, no drainage noted  Eyes: Conjunctivae are normal.  Cardiovascular: Normal rate,  regular rhythm, normal heart sounds and intact distal pulses.   No murmur heard. Pulmonary/Chest: Effort normal and breath sounds normal. No respiratory distress. She has no wheezes. She has no rales.  Musculoskeletal: Normal range of motion.  Neurological: She is alert and oriented to person, place, and time. Coordination normal.  Skin: Skin is warm and dry. No rash noted. She is not diaphoretic.  Psychiatric: She  has a normal mood and affect. Her behavior is normal.  Vitals reviewed.     Assessment & Plan:   Problem List Items Addressed This Visit      Respiratory   Allergic rhinitis - Primary   Relevant Medications   predniSONE (DELTASONE) 20 MG tablet       Follow up plan: Return if symptoms worsen or fail to improve.  Counseling provided for all of the vaccine components No orders of the defined types were placed in this encounter.   Arville CareJoshua Vidalia Serpas, MD [facility] 01/29/2017, 10:37 AM

## 2017-01-30 ENCOUNTER — Ambulatory Visit: Payer: Medicare Other | Admitting: Family

## 2017-02-03 ENCOUNTER — Telehealth: Payer: Self-pay | Admitting: Family Medicine

## 2017-02-03 NOTE — Telephone Encounter (Signed)
Patient has seen an ENT and an allergist in the past.  The ENT recommended see the allergist.  She was tested but was unable to afford to take allergy injections.

## 2017-02-03 NOTE — Telephone Encounter (Signed)
Please review and advise.

## 2017-02-03 NOTE — Telephone Encounter (Signed)
Has pt seen ENT?

## 2017-02-11 ENCOUNTER — Ambulatory Visit (INDEPENDENT_AMBULATORY_CARE_PROVIDER_SITE_OTHER): Payer: Medicare Other | Admitting: Family Medicine

## 2017-02-11 VITALS — BP 134/87 | HR 91 | Temp 97.3°F | Ht 66.0 in | Wt 187.0 lb

## 2017-02-11 DIAGNOSIS — J0101 Acute recurrent maxillary sinusitis: Secondary | ICD-10-CM

## 2017-02-12 ENCOUNTER — Encounter: Payer: Self-pay | Admitting: Family Medicine

## 2017-02-15 MED ORDER — AZITHROMYCIN 250 MG PO TABS: ORAL_TABLET | ORAL | 0 refills | Status: DC

## 2017-02-15 MED ORDER — PREDNISONE 20 MG PO TABS: ORAL_TABLET | ORAL | 0 refills | Status: DC

## 2017-02-15 NOTE — Progress Notes (Signed)
BP 134/87   Pulse 91   Temp (!) 97.3 F (36.3 C) (Oral)   Ht 5\' 6"  (1.676 m)   Wt 187 lb (84.8 kg)   BMI 30.18 kg/m    Subjective:    Patient ID: Abigail Moss, female    DOB: 20-Nov-1945, 71 y.o.   MRN: [id number]  HPI: Abigail Moss is a 71 y.o. female presenting on 02/11/2017 for Cough (pt here today c/o cough, fluid in ears and thinks it is related to allergies/sinus. Pt was seen 2 weeks ago and given oral prednisone for the same thing but hasn't gotten any better.)   HPI Cough and congestion Patient has been having persistent cough and congestion over the past couple weeks. She says she has been having pressure in her right ear in her sinuses and having chest congestion. She denies any fevers or chills or shortness of breath or wheezing. She denies any sick contacts that she knows of. She is a gets like this a few times a year.  Relevant past medical, surgical, family and social history reviewed and updated as indicated. Interim medical history since our last visit reviewed. Allergies and medications reviewed and updated.  Review of Systems  Constitutional: Negative for chills and fever.  HENT: Positive for congestion, postnasal drip, rhinorrhea, sinus pressure, sneezing and sore throat. Negative for ear discharge and ear pain.   Eyes: Negative for pain, redness and visual disturbance.  Respiratory: Positive for cough. Negative for chest tightness and shortness of breath.   Cardiovascular: Negative for chest pain and leg swelling.  Genitourinary: Negative for difficulty urinating and dysuria.  Musculoskeletal: Negative for back pain and gait problem.  Skin: Negative for rash.  Neurological: Negative for light-headedness and headaches.  Psychiatric/Behavioral: Negative for agitation and behavioral problems.  All other systems reviewed and are negative.   Per HPI unless specifically indicated above     Objective:    BP 134/87   Pulse 91   Temp (!) 97.3 F (36.3 C) (Oral)    Ht 5\' 6"  (1.676 m)   Wt 187 lb (84.8 kg)   BMI 30.18 kg/m   Wt Readings from Last 3 Encounters:  02/11/17 187 lb (84.8 kg)  01/29/17 186 lb (84.4 kg)  12/19/16 186 lb (84.4 kg)    Physical Exam  Constitutional: She is oriented to person, place, and time. She appears well-developed and well-nourished. No distress.  HENT:  Right Ear: Tympanic membrane, external ear and ear canal normal.  Left Ear: Tympanic membrane, external ear and ear canal normal.  Nose: Mucosal edema and rhinorrhea present. No epistaxis. Right sinus exhibits maxillary sinus tenderness. Right sinus exhibits no frontal sinus tenderness. Left sinus exhibits maxillary sinus tenderness. Left sinus exhibits no frontal sinus tenderness.  Mouth/Throat: Uvula is midline and mucous membranes are normal. Posterior oropharyngeal edema and posterior oropharyngeal erythema present. No oropharyngeal exudate or tonsillar abscesses.  Eyes: Conjunctivae and EOM are normal.  Cardiovascular: Normal rate, regular rhythm, normal heart sounds and intact distal pulses.   No murmur heard. Pulmonary/Chest: Effort normal and breath sounds normal. No respiratory distress. She has no wheezes. She has no rales.  Musculoskeletal: Normal range of motion. She exhibits no edema or tenderness.  Neurological: She is alert and oriented to person, place, and time. Coordination normal.  Skin: Skin is warm and dry. No rash noted. She is not diaphoretic.  Psychiatric: She has a normal mood and affect. Her behavior is normal.  Vitals reviewed.  Assessment & Plan:   Problem List Items Addressed This Visit    None    Visit Diagnoses    Acute recurrent maxillary sinusitis    -  Primary   Relevant Medications   azithromycin (ZITHROMAX) 250 MG tablet   predniSONE (DELTASONE) 20 MG tablet       Follow up plan: Return if symptoms worsen or fail to improve.  Counseling provided for all of the vaccine components No orders of the defined types  were placed in this encounter.   Arville Care, MD [facility] 02/11/2017, 4:11 PM

## 2017-03-10 ENCOUNTER — Other Ambulatory Visit: Payer: Self-pay | Admitting: Family Medicine

## 2017-03-10 DIAGNOSIS — F411 Generalized anxiety disorder: Secondary | ICD-10-CM

## 2017-03-13 ENCOUNTER — Other Ambulatory Visit: Payer: Self-pay | Admitting: Family Medicine

## 2017-03-13 DIAGNOSIS — K219 Gastro-esophageal reflux disease without esophagitis: Secondary | ICD-10-CM

## 2017-03-17 ENCOUNTER — Other Ambulatory Visit: Payer: Self-pay | Admitting: Family Medicine

## 2017-03-17 DIAGNOSIS — K219 Gastro-esophageal reflux disease without esophagitis: Secondary | ICD-10-CM

## 2017-03-31 ENCOUNTER — Other Ambulatory Visit: Payer: Self-pay | Admitting: Family

## 2017-04-07 ENCOUNTER — Ambulatory Visit (INDEPENDENT_AMBULATORY_CARE_PROVIDER_SITE_OTHER): Payer: Medicare Other | Admitting: Family

## 2017-04-07 ENCOUNTER — Encounter: Payer: Self-pay | Admitting: Family

## 2017-04-07 VITALS — BP 127/84 | HR 92 | Temp 97.5°F | Ht 66.0 in | Wt 191.4 lb

## 2017-04-07 DIAGNOSIS — N3001 Acute cystitis with hematuria: Secondary | ICD-10-CM | POA: Diagnosis not present

## 2017-04-07 DIAGNOSIS — R300 Dysuria: Secondary | ICD-10-CM

## 2017-04-07 LAB — MICROSCOPIC EXAMINATION

## 2017-04-07 LAB — URINALYSIS, COMPLETE
Bilirubin, UA: NEGATIVE
GLUCOSE, UA: NEGATIVE
Ketones, UA: NEGATIVE
NITRITE UA: POSITIVE — AB
Protein, UA: NEGATIVE
UUROB: 0.2 mg/dL (ref 0.2–1.0)
pH, UA: 5.5 (ref 5.0–7.5)

## 2017-04-07 MED ORDER — NITROFURANTOIN MONOHYD MACRO 100 MG PO CAPS: 100.0000 mg | ORAL_CAPSULE | Freq: Two times a day (BID) | ORAL | 0 refills | Status: DC

## 2017-04-07 NOTE — Progress Notes (Signed)
   Subjective:    Patient ID: Abigail Moss, female    DOB: Jun 13, 1946, 71 y.o.   MRN: [id number]  Urinary Frequency   This is a new problem. The current episode started in the past 7 days. The problem occurs every urination. The problem has been gradually worsening. The quality of the pain is described as burning. The pain is at a severity of 2/10. The pain is mild. Associated symptoms include frequency, hesitancy and urgency. Pertinent negatives include no flank pain, hematuria, nausea or vomiting. She has tried increased fluids for the symptoms. The treatment provided mild relief.      Review of Systems  Gastrointestinal: Negative for nausea and vomiting.  Genitourinary: Positive for frequency, hesitancy and urgency. Negative for flank pain and hematuria.  All other systems reviewed and are negative.      Objective:   Physical Exam  Constitutional: She is oriented to person, place, and time. She appears well-developed and well-nourished. No distress.  HENT:  Head: Normocephalic.  Eyes: Pupils are equal, round, and reactive to light.  Neck: Normal range of motion. Neck supple. No thyromegaly present.  Cardiovascular: Normal rate, regular rhythm, normal heart sounds and intact distal pulses.   No murmur heard. Pulmonary/Chest: Effort normal and breath sounds normal. No respiratory distress. She has no wheezes.  Abdominal: Soft. Bowel sounds are normal. She exhibits no distension. There is no tenderness.  Musculoskeletal: Normal range of motion. She exhibits no edema or tenderness.  Neurological: She is alert and oriented to person, place, and time.  Skin: Skin is warm and dry.  Psychiatric: She has a normal mood and affect. Her behavior is normal. Judgment and thought content normal.  Vitals reviewed.     BP 127/84   Pulse 92   Temp (!) 97.5 F (36.4 C) (Oral)   Ht  (1.676 m)   Wt 191 lb 6.4 oz (86.8 kg)   BMI 30.89 kg/m      Assessment & Plan:  1. Dysuria -  Urinalysis, Complete  2. Acute cystitis with hematuria Force fluids AZO over the counter X2 days RTO prn Culture pending - Urine Culture - nitrofurantoin, macrocrystal-monohydrate, (MACROBID) 100 MG capsule; Take 1 capsule (100 mg total) by mouth 2 (two) times daily.  Dispense: 14 capsule; Refill: 0    Jannifer Rodney, FNP

## 2017-04-07 NOTE — Patient Instructions (Signed)

## 2017-04-08 ENCOUNTER — Telehealth: Payer: Self-pay | Admitting: Family Medicine

## 2017-04-08 NOTE — Telephone Encounter (Signed)
Please advise 

## 2017-04-09 MED ORDER — ONDANSETRON 4 MG PO TBDP: 4.0000 mg | ORAL_TABLET | Freq: Three times a day (TID) | ORAL | 0 refills | Status: DC | PRN

## 2017-04-09 NOTE — Telephone Encounter (Signed)
Pt aware.

## 2017-04-09 NOTE — Telephone Encounter (Signed)
Zofran Prescription sent to pharmacy. We try to avoid phenergan in older adults because it is sedating. Zofran works great, take 30 mins before taking antibiotic.

## 2017-04-12 LAB — URINE CULTURE

## 2017-04-21 DIAGNOSIS — L600 Ingrowing nail: Secondary | ICD-10-CM | POA: Diagnosis not present

## 2017-05-05 DIAGNOSIS — L03329 Acute lymphangitis of trunk, unspecified: Secondary | ICD-10-CM | POA: Diagnosis not present

## 2017-05-05 DIAGNOSIS — M79675 Pain in left toe(s): Secondary | ICD-10-CM | POA: Diagnosis not present

## 2017-06-08 ENCOUNTER — Other Ambulatory Visit: Payer: Self-pay | Admitting: Family Medicine

## 2017-06-08 DIAGNOSIS — F411 Generalized anxiety disorder: Secondary | ICD-10-CM

## 2017-06-24 ENCOUNTER — Encounter: Payer: Self-pay | Admitting: Family

## 2017-06-24 ENCOUNTER — Ambulatory Visit (INDEPENDENT_AMBULATORY_CARE_PROVIDER_SITE_OTHER): Payer: Medicare Other | Admitting: Family

## 2017-06-24 VITALS — BP 122/85 | HR 88 | Temp 97.6°F | Ht 66.0 in | Wt 188.0 lb

## 2017-06-24 DIAGNOSIS — F411 Generalized anxiety disorder: Secondary | ICD-10-CM | POA: Diagnosis not present

## 2017-06-24 DIAGNOSIS — I10 Essential (primary) hypertension: Secondary | ICD-10-CM

## 2017-06-24 DIAGNOSIS — E039 Hypothyroidism, unspecified: Secondary | ICD-10-CM

## 2017-06-24 DIAGNOSIS — E559 Vitamin D deficiency, unspecified: Secondary | ICD-10-CM | POA: Diagnosis not present

## 2017-06-24 DIAGNOSIS — E669 Obesity, unspecified: Secondary | ICD-10-CM

## 2017-06-24 DIAGNOSIS — J320 Chronic maxillary sinusitis: Secondary | ICD-10-CM | POA: Diagnosis not present

## 2017-06-24 DIAGNOSIS — K219 Gastro-esophageal reflux disease without esophagitis: Secondary | ICD-10-CM

## 2017-06-24 MED ORDER — OMEPRAZOLE 40 MG PO CPDR: 40.0000 mg | DELAYED_RELEASE_CAPSULE | Freq: Every day | ORAL | 6 refills | Status: DC

## 2017-06-24 MED ORDER — AMOXICILLIN-POT CLAVULANATE 875-125 MG PO TABS: 1.0000 | ORAL_TABLET | Freq: Two times a day (BID) | ORAL | 0 refills | Status: DC

## 2017-06-24 NOTE — Patient Instructions (Signed)

## 2017-06-24 NOTE — Progress Notes (Signed)
Subjective:    Patient ID: Abigail Moss, female    DOB: 1945/12/09, 71 y.o.   MRN: [id number]  PT presents to the office today for chronic follow up.  Hypertension  This is a chronic problem. The current episode started more than 1 year ago. The problem has been resolved since onset. The problem is controlled. Associated symptoms include anxiety, headaches and malaise/fatigue. Pertinent negatives include no peripheral edema or shortness of breath. Risk factors for coronary artery disease include dyslipidemia, obesity, post-menopausal state, sedentary lifestyle and family history. The current treatment provides moderate improvement. There is no history of kidney disease, CAD/MI, CVA or heart failure. Identifiable causes of hypertension include a thyroid problem.  Gastroesophageal Reflux  She reports no belching, no coughing or no heartburn. This is a chronic problem. The current episode started more than 1 year ago. The problem occurs occasionally. The problem has been waxing and waning. Associated symptoms include fatigue. She has tried a PPI for the symptoms. The treatment provided moderate relief.  Thyroid Problem  Presents for follow-up visit. Symptoms include anxiety and fatigue. Patient reports no constipation, depressed mood, diarrhea or dry skin. The symptoms have been stable. There is no history of heart failure.  Anxiety  Presents for follow-up visit. Symptoms include excessive worry and nervous/anxious behavior. Patient reports no decreased concentration, depressed mood or shortness of breath. Symptoms occur occasionally. The severity of symptoms is mild.    Sinusitis  This is a chronic problem. The current episode started more than 1 year ago. The problem has been waxing and waning since onset. Associated symptoms include congestion, ear pain, headaches, sinus pressure and sneezing. Pertinent negatives include no coughing or shortness of breath.      Review of Systems    Constitutional: Positive for fatigue and malaise/fatigue.  HENT: Positive for congestion, ear pain, sinus pressure and sneezing.   Respiratory: Negative for cough and shortness of breath.   Gastrointestinal: Negative for constipation, diarrhea and heartburn.  Neurological: Positive for headaches.  Psychiatric/Behavioral: Negative for decreased concentration. The patient is nervous/anxious.   All other systems reviewed and are negative.      Objective:   Physical Exam  Constitutional: She is oriented to person, place, and time. She appears well-developed and well-nourished. No distress.  HENT:  Head: Normocephalic and atraumatic.  Right Ear: There is drainage (tube present).  Left Ear: There is drainage (tube present).  Nose: Mucosal edema and rhinorrhea present. Right sinus exhibits maxillary sinus tenderness. Left sinus exhibits maxillary sinus tenderness.  Mouth/Throat: Posterior oropharyngeal erythema present. No oropharyngeal exudate.  Eyes: Pupils are equal, round, and reactive to light.  Neck: Normal range of motion. Neck supple. No thyromegaly present.  Cardiovascular: Normal rate, regular rhythm, normal heart sounds and intact distal pulses.  No murmur heard. Pulmonary/Chest: Effort normal and breath sounds normal. No respiratory distress. She has no wheezes.  Abdominal: Soft. Bowel sounds are normal. She exhibits no distension. There is no tenderness.  Musculoskeletal: Normal range of motion. She exhibits no edema or tenderness.  Neurological: She is alert and oriented to person, place, and time.  Skin: Skin is warm and dry.  Psychiatric: She has a normal mood and affect. Her behavior is normal. Judgment and thought content normal.  Vitals reviewed.    BP 122/85   Pulse 88   Temp 97.6 F (36.4 C) (Oral)   Ht '5\' 6"'  (1.676 m)   Wt 188 lb (85.3 kg)   BMI 30.34 kg/m  Assessment & Plan:  1. Essential hypertension - CMP14+EGFR  2. Hypothyroidism, unspecified  type - CMP14+EGFR - TSH  3. Gastroesophageal reflux disease, esophagitis presence not specified - CMP14+EGFR - omeprazole (PRILOSEC) 40 MG capsule; Take 1 capsule (40 mg total) by mouth daily.  Dispense: 30 capsule; Refill: 6  4. GAD (generalized anxiety disorder) - CMP14+EGFR  5. Obesity (BMI 30-39.9) - CMP14+EGFR  6. Vitamin D deficiency - CMP14+EGFR  7. Chronic maxillary sinusitis - Take meds as prescribed - Use a cool mist humidifier  -Use saline nose sprays frequently -Force fluids -For any cough or congestion  Use plain Mucinex- regular strength or max strength is fine -For fever or aces or pains- take tylenol or ibuprofen appropriate for age and weight. - CMP14+EGFR - Ambulatory referral to Allergy - amoxicillin-clavulanate (AUGMENTIN) 875-125 MG tablet; Take 1 tablet by mouth 2 (two) times daily.  Dispense: 14 tablet; Refill: 0   Continue all meds Labs pending Health Maintenance reviewed Diet and exercise encouraged RTO 4 months   [name], FNP

## 2017-06-25 LAB — CMP14+EGFR
A/G RATIO: 1.6 (ref 1.2–2.2)
ALBUMIN: 4.2 g/dL (ref 3.5–4.8)
ALK PHOS: 67 IU/L (ref 39–117)
ALT: 20 IU/L (ref 0–32)
AST: 22 IU/L (ref 0–40)
BILIRUBIN TOTAL: 0.3 mg/dL (ref 0.0–1.2)
BUN / CREAT RATIO: 17 (ref 12–28)
BUN: 14 mg/dL (ref 8–27)
CO2: 24 mmol/L (ref 20–29)
CREATININE: 0.82 mg/dL (ref 0.57–1.00)
Calcium: 9.1 mg/dL (ref 8.7–10.3)
Chloride: 107 mmol/L — ABNORMAL HIGH (ref 96–106)
GFR calc Af Amer: 82 mL/min/{1.73_m2} (ref >59)
GFR calc non Af Amer: 71 mL/min/{1.73_m2} (ref >59)
GLOBULIN, TOTAL: 2.6 g/dL (ref 1.5–4.5)
Glucose: 81 mg/dL (ref 65–99)
POTASSIUM: 4.3 mmol/L (ref 3.5–5.2)
SODIUM: 144 mmol/L (ref 134–144)
Total Protein: 6.8 g/dL (ref 6.0–8.5)

## 2017-06-25 LAB — TSH: TSH: 0.802 u[IU]/mL (ref 0.450–4.500)

## 2017-06-27 ENCOUNTER — Other Ambulatory Visit: Payer: Self-pay | Admitting: Family Medicine

## 2017-07-13 ENCOUNTER — Encounter: Payer: Self-pay | Admitting: Allergy and Immunology

## 2017-07-13 DIAGNOSIS — H40033 Anatomical narrow angle, bilateral: Secondary | ICD-10-CM | POA: Diagnosis not present

## 2017-07-13 DIAGNOSIS — H04123 Dry eye syndrome of bilateral lacrimal glands: Secondary | ICD-10-CM | POA: Diagnosis not present

## 2017-08-03 ENCOUNTER — Ambulatory Visit (INDEPENDENT_AMBULATORY_CARE_PROVIDER_SITE_OTHER): Payer: Medicare Other | Admitting: Family Medicine

## 2017-08-03 ENCOUNTER — Encounter: Payer: Self-pay | Admitting: Family Medicine

## 2017-08-03 VITALS — BP 136/84 | HR 85 | Temp 98.0°F | Ht 66.0 in | Wt 186.0 lb

## 2017-08-03 DIAGNOSIS — R300 Dysuria: Secondary | ICD-10-CM | POA: Diagnosis not present

## 2017-08-03 DIAGNOSIS — N3001 Acute cystitis with hematuria: Secondary | ICD-10-CM | POA: Diagnosis not present

## 2017-08-03 LAB — URINALYSIS, COMPLETE
BILIRUBIN UA: NEGATIVE
Nitrite, UA: POSITIVE — AB
PH UA: 5 (ref 5.0–7.5)
SPEC GRAV UA: 1.02 (ref 1.005–1.030)
Urobilinogen, Ur: 1 mg/dL (ref 0.2–1.0)

## 2017-08-03 LAB — MICROSCOPIC EXAMINATION
RENAL EPITHEL UA: NONE SEEN /HPF
WBC, UA: >30 /hpf — AB (ref 0–?)

## 2017-08-03 MED ORDER — CIPROFLOXACIN HCL 250 MG PO TABS: 250.0000 mg | ORAL_TABLET | Freq: Two times a day (BID) | ORAL | 0 refills | Status: DC

## 2017-08-03 NOTE — Progress Notes (Signed)
u

## 2017-08-03 NOTE — Addendum Note (Signed)
Addended by: Prescott GumLAND, Illona Bulman M on: 08/03/2017 01:02 PM   Modules accepted: Orders

## 2017-08-03 NOTE — Progress Notes (Signed)
Subjective: CC: urinary symptoms PCP: Mechele Claude, MD RUE:AVWU Abigail Moss is a 72 y.o. female presenting to clinic today for:  1. Urinary symptoms Patient repors a 2 day h/o urinary frequency .  She reports mild R lower back pain of 1 day duration.  Denies urgency, hematuria, fevers, chills, abdominal pain, nausea, vomiting, vaginal discharge.  Patient has used Engineering geologist for symptoms.  Patient denies a h/o frequent or recurrent UTIs.  Last UTI 4 months ago.  She reports UTIs usually respond well to Cipro.  Patient's May 2018 urine culture with multidrug resistant E. coli.  It was sensitive to nitrofurantoin.  Her October 2018 urine culture with Klebsiella that was also sensitive to nitrofurantoin and ciprofloxacin.  ROS: Per HPI  Allergies  Allergen Reactions  . Neomycin     REACTION: itching  . Sulfur     REACTION: itching  . Latex Rash   Past Medical History:  Diagnosis Date  . GERD (gastroesophageal reflux disease)   . Hypertension   . Osteopenia   . Thyroid disease     Current Outpatient Medications:  .  acetic acid-hydrocortisone (VOSOL-HC) OTIC solution, Place 4 drops into both ears 4 (four) times daily., Disp: 10 mL, Rfl: 2 .  albuterol (PROVENTIL HFA;VENTOLIN HFA) 108 (90 Base) MCG/ACT inhaler, Inhale 2 puffs into the lungs every 6 (six) hours as needed for wheezing or shortness of breath., Disp: 1 Inhaler, Rfl: 2 .  alendronate (FOSAMAX) 70 MG tablet, Take 1 tablet (70 mg total) by mouth every 7 (seven) days. Take with a full glass of water on an empty stomach., Disp: 4 tablet, Rfl: 11 .  ALPRAZolam (XANAX) 0.5 MG tablet, Take 1 tablet (0.5 mg total) by mouth at bedtime as needed., Disp: 30 tablet, Rfl: 3 .  aspirin EC 81 MG tablet, Take 81 mg by mouth daily., Disp: , Rfl:  .  buPROPion (WELLBUTRIN XL) 300 MG 24 hr tablet, TAKE 1 TABLET (300 MG TOTAL) BY MOUTH DAILY., Disp: 90 tablet, Rfl: 0 .  Calcium Citrate-Vitamin D (CALCIUM CITRATE + PO), Take 600 mg by mouth  daily., Disp: , Rfl:  .  Cholecalciferol (VITAMIN D-3 PO), Take 800 mg by mouth daily., Disp: , Rfl:  .  fluticasone (FLONASE) 50 MCG/ACT nasal spray, PLACE 2 SPRAYS INTO BOTH NOSTRILS DAILY., Disp: 16 g, Rfl: 4 .  FLUZONE HIGH-DOSE 0.5 ML injection, TO BE ADMINISTERED BY PHARMACIST FOR IMMUNIZATION, Disp: , Rfl: 0 .  levothyroxine (SYNTHROID, LEVOTHROID) 125 MCG tablet, TAKE 1 TABLET (125 MCG TOTAL) BY MOUTH DAILY BEFORE BREAKFAST., Disp: 90 tablet, Rfl: 1 .  losartan (COZAAR) 100 MG tablet, TAKE ONE TABLET BY MOUTH ONE TIME DAILY, Disp: 90 tablet, Rfl: 1 .  montelukast (SINGULAIR) 10 MG tablet, Take 1 tablet (10 mg total) by mouth at bedtime., Disp: 90 tablet, Rfl: 1 .  MULTIPLE VITAMIN PO, Take by mouth., Disp: , Rfl:  .  naproxen (NAPROSYN) 500 MG tablet, Take 1 tablet (500 mg total) by mouth 2 (two) times daily with a meal., Disp: 60 tablet, Rfl: 1 .  omeprazole (PRILOSEC) 40 MG capsule, Take 1 capsule (40 mg total) by mouth daily., Disp: 30 capsule, Rfl: 6 Social History   Socioeconomic History  . Marital status: Married    Spouse name: Not on file  . Number of children: 2  . Years of education: Not on file  . Highest education level: Not on file  Social Needs  . Financial resource strain: Not on file  . Food  insecurity - worry: Not on file  . Food insecurity - inability: Not on file  . Transportation needs - medical: Not on file  . Transportation needs - non-medical: Not on file  Occupational History  . Not on file  Tobacco Use  . Smoking status: Former Smoker    Last attempt to quit: 07/07/1996    Years since quitting: 21.0  . Smokeless tobacco: Never Used  Substance and Sexual Activity  . Alcohol use: No    Alcohol/week: 0.0 oz  . Drug use: No  . Sexual activity: Not on file  Other Topics Concern  . Not on file  Social History Narrative   Lives at home with husband.     Family History  Problem Relation Age of Onset  . Heart disease Mother [id number]       CHF  . Diabetes  Father   . Heart disease Sister        CHF  . Cancer Brother   . Cancer Sister        throat    Objective: Office vital signs reviewed. BP 136/84   Pulse 85   Temp 98 F (36.7 C) (Oral)   Ht 5\' 6"  (1.676 m)   Wt 186 lb (84.4 kg)   BMI 30.02 kg/m   Physical Examination:  General: Awake, alert, well nourished, No acute distress GU: no suprapubic or CVA TTP  Assessment/ Plan: 72 y.o. female   1. Dysuria Urinalysis with nitrites, leukocytes, red blood cells.  Urine microscopy with bacteria, 3-10 red blood cells, greater than 30 white blood cells.  Urine culture sent.  Patient is afebrile well-appearing.  She demonstrates no evidence of pyelonephritis.  We discussed that in the setting of positive nitrites this is likely an E. coli infection.  Her previous urine cultures were reviewed and she had multidrug resistant E. coli.  I highly recommended that we prescribe nitrofurantoin as this seemed to cover both the E. coli and Klebsiella infections that she previously had.  However, she was adamant that she be prescribed Cipro and was willing to take the chance that she may need an additional antibiotic later.  She has capacity.  Ciprofloxacin 250 mg p.o. twice daily by 3 days prescribed.  Will contact patient with urine culture results. - Urinalysis, Complete  2. Acute cystitis with hematuria - Urine Culture   Orders Placed This Encounter  Procedures  . Urine Culture  . Urinalysis, Complete   Meds ordered this encounter  Medications  . ciprofloxacin (CIPRO) 250 MG tablet    Sig: Take 1 tablet (250 mg total) by mouth 2 (two) times daily.    Dispense:  6 tablet    Refill:  0     Kassius Battiste Hulen SkainsM Tim Corriher, DO [facility] [phone]

## 2017-08-05 ENCOUNTER — Other Ambulatory Visit: Payer: Self-pay | Admitting: Family Medicine

## 2017-08-05 ENCOUNTER — Telehealth: Payer: Self-pay | Admitting: Family Medicine

## 2017-08-05 DIAGNOSIS — H04123 Dry eye syndrome of bilateral lacrimal glands: Secondary | ICD-10-CM | POA: Diagnosis not present

## 2017-08-05 MED ORDER — NITROFURANTOIN MONOHYD MACRO 100 MG PO CAPS: 100.0000 mg | ORAL_CAPSULE | Freq: Two times a day (BID) | ORAL | 0 refills | Status: AC

## 2017-08-05 NOTE — Telephone Encounter (Signed)
I have replaced medication with Macrobid.

## 2017-08-05 NOTE — Telephone Encounter (Signed)
Patient seen Dr. Reece AgarG 1/28 and was perscribed Cipro. Patient states it is not working and patient is still having a lot of dysuria. [facility] [place]  Please advise.

## 2017-08-05 NOTE — Telephone Encounter (Signed)
Lm- new rx was sent to the pharmacy.

## 2017-09-11 ENCOUNTER — Other Ambulatory Visit: Payer: Self-pay | Admitting: Family Medicine

## 2017-09-11 DIAGNOSIS — F411 Generalized anxiety disorder: Secondary | ICD-10-CM

## 2017-10-23 ENCOUNTER — Other Ambulatory Visit: Payer: Self-pay | Admitting: Family Medicine

## 2017-10-28 ENCOUNTER — Encounter: Payer: Self-pay | Admitting: Family Medicine

## 2017-10-28 ENCOUNTER — Ambulatory Visit (INDEPENDENT_AMBULATORY_CARE_PROVIDER_SITE_OTHER): Payer: Medicare Other | Admitting: Family Medicine

## 2017-10-28 VITALS — BP 142/92 | HR 83 | Temp 97.2°F | Ht 66.0 in | Wt 188.0 lb

## 2017-10-28 DIAGNOSIS — R350 Frequency of micturition: Secondary | ICD-10-CM | POA: Diagnosis not present

## 2017-10-28 DIAGNOSIS — N3090 Cystitis, unspecified without hematuria: Secondary | ICD-10-CM

## 2017-10-28 LAB — URINALYSIS
BILIRUBIN UA: NEGATIVE
GLUCOSE, UA: NEGATIVE
KETONES UA: NEGATIVE
Nitrite, UA: NEGATIVE
PROTEIN UA: NEGATIVE
RBC, UA: NEGATIVE
Specific Gravity, UA: <1.005 — ABNORMAL LOW (ref 1.005–1.030)
UUROB: 0.2 mg/dL (ref 0.2–1.0)
pH, UA: 5.5 (ref 5.0–7.5)

## 2017-10-28 MED ORDER — CIPROFLOXACIN HCL 250 MG PO TABS: 250.0000 mg | ORAL_TABLET | Freq: Two times a day (BID) | ORAL | 0 refills | Status: DC

## 2017-10-29 ENCOUNTER — Ambulatory Visit: Payer: Medicare Other | Admitting: Nurse Practitioner

## 2017-10-30 LAB — URINE CULTURE

## 2017-11-05 ENCOUNTER — Encounter: Payer: Self-pay | Admitting: Family Medicine

## 2017-11-05 NOTE — Progress Notes (Signed)
Chief Complaint  Patient presents with  . Urinary Frequency    HPI  Patient presents today for burning with urination and frequency for several days. Denies fever . No flank pain. No nausea, vomiting.   PMH: Smoking status noted ROS: Per HPI  Objective: BP (!) 142/92   Pulse 83   Temp (!) 97.2 F (36.2 C) (Oral)   Ht  (1.676 m)   Wt 188 lb (85.3 kg)   BMI 30.34 kg/m  Gen: NAD, alert, cooperative with exam HEENT: NCAT, EOMI, PERRL CV: RRR, good S1/S2, no murmur Resp: CTABL, no wheezes, non-labored Abd: SNTND, BS present, no guarding or organomegaly Ext: No edema, warm Neuro: Alert and oriented, No gross deficits  Assessment and plan:  1. Cystitis   2. Frequent urination     Meds ordered this encounter  Medications  . ciprofloxacin (CIPRO) 250 MG tablet    Sig: Take 1 tablet (250 mg total) by mouth 2 (two) times daily.    Dispense:  14 tablet    Refill:  0    Orders Placed This Encounter  Procedures  . Urine Culture  . Urinalysis    Follow up as needed.  Mechele Claude, MD

## 2017-11-13 ENCOUNTER — Other Ambulatory Visit: Payer: Self-pay | Admitting: Family Medicine

## 2017-11-13 DIAGNOSIS — J302 Other seasonal allergic rhinitis: Secondary | ICD-10-CM

## 2017-11-21 ENCOUNTER — Other Ambulatory Visit: Payer: Self-pay | Admitting: Family

## 2017-11-21 DIAGNOSIS — M810 Age-related osteoporosis without current pathological fracture: Secondary | ICD-10-CM

## 2017-12-17 ENCOUNTER — Other Ambulatory Visit: Payer: Self-pay | Admitting: Family

## 2017-12-21 ENCOUNTER — Encounter: Payer: Self-pay | Admitting: Family

## 2017-12-21 ENCOUNTER — Ambulatory Visit (INDEPENDENT_AMBULATORY_CARE_PROVIDER_SITE_OTHER): Payer: Medicare Other | Admitting: Family

## 2017-12-21 VITALS — BP 125/80 | HR 75 | Temp 97.3°F | Ht 66.0 in | Wt 190.0 lb

## 2017-12-21 DIAGNOSIS — E039 Hypothyroidism, unspecified: Secondary | ICD-10-CM | POA: Diagnosis not present

## 2017-12-21 DIAGNOSIS — Z0000 Encounter for general adult medical examination without abnormal findings: Secondary | ICD-10-CM

## 2017-12-21 DIAGNOSIS — M810 Age-related osteoporosis without current pathological fracture: Secondary | ICD-10-CM

## 2017-12-21 DIAGNOSIS — J309 Allergic rhinitis, unspecified: Secondary | ICD-10-CM

## 2017-12-21 DIAGNOSIS — K219 Gastro-esophageal reflux disease without esophagitis: Secondary | ICD-10-CM | POA: Diagnosis not present

## 2017-12-21 DIAGNOSIS — F411 Generalized anxiety disorder: Secondary | ICD-10-CM

## 2017-12-21 DIAGNOSIS — I10 Essential (primary) hypertension: Secondary | ICD-10-CM

## 2017-12-21 DIAGNOSIS — E559 Vitamin D deficiency, unspecified: Secondary | ICD-10-CM

## 2017-12-21 DIAGNOSIS — E669 Obesity, unspecified: Secondary | ICD-10-CM

## 2017-12-21 MED ORDER — HYDROCORTISONE-ACETIC ACID 1-2 % OT SOLN
4.0000 [drp] | Freq: Four times a day (QID) | OTIC | 2 refills | Status: DC
Start: 2017-12-21 — End: 2020-02-16

## 2017-12-21 MED ORDER — LOSARTAN POTASSIUM 100 MG PO TABS: 100.0000 mg | ORAL_TABLET | Freq: Every day | ORAL | 1 refills | Status: DC

## 2017-12-21 NOTE — Progress Notes (Signed)
Subjective:    Patient ID: Abigail Moss, female    DOB: [birth year], 72 y.o.   MRN: [id number]  Chief Complaint  Patient presents with  . Annual Exam    Hypertension  This is a chronic problem. The current episode started more than 1 year ago. The problem has been resolved since onset. The problem is controlled. Associated symptoms include anxiety and peripheral edema. Pertinent negatives include no shortness of breath ("a little in my ankle at times"). Risk factors for coronary artery disease include dyslipidemia, obesity and sedentary lifestyle. The current treatment provides moderate improvement. There is no history of kidney disease, CAD/MI, CVA or heart failure. Identifiable causes of hypertension include a thyroid problem.  Gastroesophageal Reflux  She complains of a hoarse voice. She reports no belching, no coughing or no heartburn. This is a chronic problem. The current episode started more than 1 year ago. The problem occurs rarely. The problem has been resolved. Associated symptoms include fatigue. She has tried a PPI for the symptoms. The treatment provided moderate relief.  Thyroid Problem  Presents for follow-up visit. Symptoms include anxiety, constipation, depressed mood, fatigue and hoarse voice. Patient reports no diarrhea or dry skin. The symptoms have been stable. There is no history of heart failure.  Anxiety  Presents for follow-up visit. Symptoms include depressed mood, excessive worry, insomnia, irritability and nervous/anxious behavior. Patient reports no shortness of breath ("a little in my ankle at times"). Symptoms occur occasionally. The severity of symptoms is moderate.    Osteoporosis  Pt's last Dexa Scan was 12/19/16. She takes Fosamas, calcium and vit d.  Allergic Rhinitis This has greatly improved since starting a Claritin every day.   Review of Systems  Constitutional: Positive for fatigue and irritability.  HENT: Positive for hoarse voice.   Respiratory:  Negative for cough and shortness of breath ("a little in my ankle at times").   Gastrointestinal: Positive for constipation. Negative for diarrhea and heartburn.  Psychiatric/Behavioral: The patient is nervous/anxious and has insomnia.   All other systems reviewed and are negative.      Objective:   Physical Exam  Constitutional: She is oriented to person, place, and time. She appears well-developed and well-nourished. No distress.  HENT:  Head: Normocephalic and atraumatic.  Right Ear: External ear normal. There is drainage (tube in place).  Left Ear: External ear normal. There is drainage (tube in place).  Mouth/Throat: Oropharynx is clear and moist.  Eyes: Pupils are equal, round, and reactive to light.  Neck: Normal range of motion. Neck supple. No thyromegaly present.  Cardiovascular: Normal rate, regular rhythm, normal heart sounds and intact distal pulses.  No murmur heard. Pulmonary/Chest: Effort normal and breath sounds normal. No respiratory distress. She has no wheezes.  Abdominal: Soft. Bowel sounds are normal. She exhibits no distension. There is no tenderness.  Musculoskeletal: Normal range of motion. She exhibits no edema or tenderness.  Neurological: She is alert and oriented to person, place, and time. She has normal reflexes. No cranial nerve deficit.  Skin: Skin is warm and dry.  Psychiatric: She has a normal mood and affect. Her behavior is normal. Judgment and thought content normal.  Vitals reviewed.     BP 125/80   Pulse 75   Temp (!) 97.3 F (36.3 C) (Oral)   Ht '5\' 6"'  (1.676 m)   Wt 190 lb (86.2 kg)   BMI 30.67 kg/m      Assessment & Plan:  Abigail Moss comes in today with  chief complaint of Annual Exam   Diagnosis and orders addressed:  1. Essential hypertension - CMP14+EGFR - CBC with Differential/Platelet - losartan (COZAAR) 100 MG tablet; Take 1 tablet (100 mg total) by mouth daily.  Dispense: 90 tablet; Refill: 1  2. Gastroesophageal  reflux disease, esophagitis presence not specified - CMP14+EGFR - CBC with Differential/Platelet  3. Hypothyroidism, unspecified type - CMP14+EGFR - CBC with Differential/Platelet - TSH  4. Obesity (BMI 30-39.9) - CMP14+EGFR - CBC with Differential/Platelet  5. GAD (generalized anxiety disorder) - CMP14+EGFR - CBC with Differential/Platelet  6. Osteoporosis, unspecified osteoporosis type, unspecified pathological fracture presence - CMP14+EGFR - CBC with Differential/Platelet - VITAMIN D 25 Hydroxy (Vit-D Deficiency, Fractures)  7. Vitamin D deficiency - CMP14+EGFR - CBC with Differential/Platelet - VITAMIN D 25 Hydroxy (Vit-D Deficiency, Fractures)  8. Allergic rhinitis, unspecified seasonality, unspecified trigger - CMP14+EGFR - CBC with Differential/Platelet  9. Annual physical exam - CMP14+EGFR - CBC with Differential/Platelet - Lipid panel - VITAMIN D 25 Hydroxy (Vit-D Deficiency, Fractures) - TSH   Labs pending Health Maintenance reviewed Diet and exercise encouraged  Follow up plan: 6 moths    [name], FNP

## 2017-12-21 NOTE — Patient Instructions (Signed)

## 2017-12-22 LAB — CMP14+EGFR
ALBUMIN: 4.2 g/dL (ref 3.5–4.8)
ALK PHOS: 70 IU/L (ref 39–117)
ALT: 19 IU/L (ref 0–32)
AST: 19 IU/L (ref 0–40)
Albumin/Globulin Ratio: 1.6 (ref 1.2–2.2)
BUN / CREAT RATIO: 15 (ref 12–28)
BUN: 13 mg/dL (ref 8–27)
Bilirubin Total: 0.4 mg/dL (ref 0.0–1.2)
CO2: 26 mmol/L (ref 20–29)
CREATININE: 0.86 mg/dL (ref 0.57–1.00)
Calcium: 9.9 mg/dL (ref 8.7–10.3)
Chloride: 102 mmol/L (ref 96–106)
GFR, EST AFRICAN AMERICAN: 78 mL/min/{1.73_m2} (ref >59)
GFR, EST NON AFRICAN AMERICAN: 67 mL/min/{1.73_m2} (ref >59)
GLOBULIN, TOTAL: 2.7 g/dL (ref 1.5–4.5)
Glucose: 80 mg/dL (ref 65–99)
Potassium: 4.3 mmol/L (ref 3.5–5.2)
SODIUM: 142 mmol/L (ref 134–144)
TOTAL PROTEIN: 6.9 g/dL (ref 6.0–8.5)

## 2017-12-22 LAB — CBC WITH DIFFERENTIAL/PLATELET
BASOS ABS: 0 10*3/uL (ref 0.0–0.2)
Basos: 1 %
EOS (ABSOLUTE): 0.2 10*3/uL (ref 0.0–0.4)
Eos: 4 %
HEMOGLOBIN: 12.6 g/dL (ref 11.1–15.9)
Hematocrit: 38 % (ref 34.0–46.6)
IMMATURE GRANS (ABS): 0 10*3/uL (ref 0.0–0.1)
Immature Granulocytes: 0 %
LYMPHS ABS: 1.8 10*3/uL (ref 0.7–3.1)
LYMPHS: 40 %
MCH: 30.4 pg (ref 26.6–33.0)
MCHC: 33.2 g/dL (ref 31.5–35.7)
MCV: 92 fL (ref 79–97)
MONOCYTES: 6 %
Monocytes Absolute: 0.3 10*3/uL (ref 0.1–0.9)
Neutrophils Absolute: 2.2 10*3/uL (ref 1.4–7.0)
Neutrophils: 49 %
PLATELETS: 256 10*3/uL (ref 150–450)
RBC: 4.15 x10E6/uL (ref 3.77–5.28)
RDW: 14 % (ref 12.3–15.4)
WBC: 4.6 10*3/uL (ref 3.4–10.8)

## 2017-12-22 LAB — LIPID PANEL
CHOLESTEROL TOTAL: 170 mg/dL (ref 100–199)
Chol/HDL Ratio: 2.9 ratio (ref 0.0–4.4)
HDL: 58 mg/dL (ref >39)
LDL CALC: 96 mg/dL (ref 0–99)
TRIGLYCERIDES: 81 mg/dL (ref 0–149)
VLDL Cholesterol Cal: 16 mg/dL (ref 5–40)

## 2017-12-22 LAB — VITAMIN D 25 HYDROXY (VIT D DEFICIENCY, FRACTURES): Vit D, 25-Hydroxy: 53.6 ng/mL (ref 30.0–100.0)

## 2017-12-22 LAB — TSH: TSH: 1.02 u[IU]/mL (ref 0.450–4.500)

## 2017-12-25 ENCOUNTER — Other Ambulatory Visit: Payer: Self-pay | Admitting: Family Medicine

## 2017-12-25 DIAGNOSIS — F411 Generalized anxiety disorder: Secondary | ICD-10-CM

## 2017-12-28 DIAGNOSIS — Z1231 Encounter for screening mammogram for malignant neoplasm of breast: Secondary | ICD-10-CM | POA: Diagnosis not present

## 2017-12-28 LAB — HM MAMMOGRAPHY

## 2018-01-18 ENCOUNTER — Encounter: Payer: Self-pay | Admitting: *Deleted

## 2018-01-18 ENCOUNTER — Telehealth: Payer: Self-pay | Admitting: Family

## 2018-01-18 NOTE — Telephone Encounter (Signed)
What symptoms do you have? Frequently going to the bathroom to urinate and having burning. Can't sleep because she has to get up so much during the night. Too sick to come and be seen.  How long have you been sick? Since Sunday night  Have you been seen for this problem? In  April  If your provider decides to give you a prescription, which pharmacy would you like for it to be sent to? [facility] [place]   Patient informed that this information will be sent to the clinical staff for review and that they should receive a follow up call.

## 2018-01-18 NOTE — Telephone Encounter (Signed)
Patient has an appointment scheduled.  °

## 2018-01-19 ENCOUNTER — Encounter: Payer: Self-pay | Admitting: Family

## 2018-01-19 ENCOUNTER — Ambulatory Visit (INDEPENDENT_AMBULATORY_CARE_PROVIDER_SITE_OTHER): Payer: Medicare Other | Admitting: Family

## 2018-01-19 VITALS — BP 116/74 | HR 85 | Temp 97.2°F | Ht 66.0 in | Wt 190.6 lb

## 2018-01-19 DIAGNOSIS — M19071 Primary osteoarthritis, right ankle and foot: Secondary | ICD-10-CM

## 2018-01-19 DIAGNOSIS — R300 Dysuria: Secondary | ICD-10-CM

## 2018-01-19 DIAGNOSIS — J302 Other seasonal allergic rhinitis: Secondary | ICD-10-CM

## 2018-01-19 DIAGNOSIS — N3001 Acute cystitis with hematuria: Secondary | ICD-10-CM

## 2018-01-19 LAB — MICROSCOPIC EXAMINATION
Renal Epithel, UA: NONE SEEN /hpf
WBC, UA: >30 /hpf — AB (ref 0–5)

## 2018-01-19 LAB — URINALYSIS, COMPLETE
Bilirubin, UA: NEGATIVE
KETONES UA: NEGATIVE
Nitrite, UA: POSITIVE — AB
PH UA: 6 (ref 5.0–7.5)
Urobilinogen, Ur: 1 mg/dL (ref 0.2–1.0)

## 2018-01-19 MED ORDER — CIPROFLOXACIN HCL 500 MG PO TABS: 500.0000 mg | ORAL_TABLET | Freq: Two times a day (BID) | ORAL | 0 refills | Status: DC

## 2018-01-19 MED ORDER — MELOXICAM 15 MG PO TABS: 15.0000 mg | ORAL_TABLET | Freq: Every day | ORAL | 0 refills | Status: DC

## 2018-01-19 MED ORDER — PHENAZOPYRIDINE HCL 100 MG PO TABS: 100.0000 mg | ORAL_TABLET | Freq: Three times a day (TID) | ORAL | 0 refills | Status: DC | PRN

## 2018-01-19 MED ORDER — FLUTICASONE PROPIONATE 50 MCG/ACT NA SUSP: 2.0000 | Freq: Every day | NASAL | 1 refills | Status: DC

## 2018-01-19 NOTE — Progress Notes (Signed)
   Subjective:    Patient ID: Abigail Moss, female    DOB: 1945/09/15, 72 y.o.   MRN: [id number]  Chief Complaint  Patient presents with  . Dysuria    Dysuria   This is a new problem. The current episode started in the past 7 days. The problem occurs every urination. The problem has been gradually worsening. The quality of the pain is described as burning. The pain is at a severity of 2/10. The pain is mild. Associated symptoms include frequency, hesitancy and urgency. Pertinent negatives include no flank pain, hematuria, nausea or vomiting. She has tried increased fluids (AZO) for the symptoms. The treatment provided mild relief.  Ankle Pain   The incident occurred more than 1 week ago. There was no injury mechanism. The pain is present in the right ankle. The pain is at a severity of 6/10. The pain has been intermittent since onset. She has tried non-weight bearing, rest and NSAIDs for the symptoms. The treatment provided moderate relief.      Review of Systems  Gastrointestinal: Negative for nausea and vomiting.  Genitourinary: Positive for dysuria, frequency, hesitancy and urgency. Negative for flank pain and hematuria.  All other systems reviewed and are negative.      Objective:   Physical Exam  Constitutional: She is oriented to person, place, and time. She appears well-developed and well-nourished. No distress.  HENT:  Head: Normocephalic and atraumatic.  Eyes: Pupils are equal, round, and reactive to light.  Neck: Normal range of motion. Neck supple. No thyromegaly present.  Cardiovascular: Normal rate, regular rhythm, normal heart sounds and intact distal pulses.  No murmur heard. Pulmonary/Chest: Effort normal and breath sounds normal. No respiratory distress. She has no wheezes.  Abdominal: Soft. Bowel sounds are normal. She exhibits no distension. There is tenderness (mild lower abdominal).  Musculoskeletal: Normal range of motion. She exhibits no edema (trace right  ankle) or tenderness.  Neurological: She is alert and oriented to person, place, and time. She has normal reflexes. No cranial nerve deficit.  Skin: Skin is warm and dry.  Psychiatric: She has a normal mood and affect. Her behavior is normal. Judgment and thought content normal.  Vitals reviewed.     BP 116/74   Pulse 85   Temp (!) 97.2 F (36.2 C) (Oral)   Ht 5\' 6"  (1.676 m)   Wt 190 lb 9.6 oz (86.5 kg)   BMI 30.76 kg/m      Assessment & Plan:  Abigail Moss comes in today with chief complaint of Dysuria   Diagnosis and orders addressed:  1. Dysuria - Urinalysis, Complete - Urine Culture  2. Seasonal allergic rhinitis - fluticasone (FLONASE) 50 MCG/ACT nasal spray; Place 2 sprays into both nostrils daily.  Dispense: 48 g; Refill: 1  3. Acute cystitis with hematuria Force fluids AZO over the counter X2 days RTO prn Culture pending - Urine Culture - ciprofloxacin (CIPRO) 500 MG tablet; Take 1 tablet (500 mg total) by mouth 2 (two) times daily.  Dispense: 10 tablet; Refill: 0  4. Primary osteoarthritis of right ankle Rest Keep elevated when possible ROM exercises encouraged - meloxicam (MOBIC) 15 MG tablet; Take 1 tablet (15 mg total) by mouth daily.  Dispense: 30 tablet; Refill: 0   Jannifer Rodneyhristy Sascha Palma, FNP

## 2018-01-19 NOTE — Telephone Encounter (Signed)
Pyridium Prescription sent to pharmacy

## 2018-01-19 NOTE — Telephone Encounter (Signed)
Aware of antibiotic but she wants a medicine that will help quickly relieve the spasms causing so much pain.  Please advise if medication will be ordered.

## 2018-01-19 NOTE — Telephone Encounter (Signed)
Pt wants rx for urine pain and urgency from UTI. Pt seen today-[name]

## 2018-01-19 NOTE — Patient Instructions (Signed)

## 2018-01-20 NOTE — Telephone Encounter (Signed)
Aware, medication sent to pharmacy.

## 2018-01-21 LAB — URINE CULTURE

## 2018-02-08 NOTE — Progress Notes (Unsigned)
In Care Everywhere  

## 2018-03-21 ENCOUNTER — Other Ambulatory Visit: Payer: Self-pay | Admitting: Family

## 2018-03-23 ENCOUNTER — Other Ambulatory Visit: Payer: Self-pay | Admitting: Family

## 2018-03-23 DIAGNOSIS — F411 Generalized anxiety disorder: Secondary | ICD-10-CM

## 2018-03-24 NOTE — Telephone Encounter (Signed)
OV 12/21/17 RTC 6mos

## 2018-04-06 ENCOUNTER — Encounter: Payer: Self-pay | Admitting: Nurse Practitioner

## 2018-04-06 ENCOUNTER — Ambulatory Visit (INDEPENDENT_AMBULATORY_CARE_PROVIDER_SITE_OTHER): Payer: Medicare Other | Admitting: Nurse Practitioner

## 2018-04-06 VITALS — BP 133/89 | HR 74 | Temp 97.2°F | Ht 66.0 in | Wt 188.0 lb

## 2018-04-06 DIAGNOSIS — J0101 Acute recurrent maxillary sinusitis: Secondary | ICD-10-CM

## 2018-04-06 MED ORDER — AMOXICILLIN 875 MG PO TABS: 875.0000 mg | ORAL_TABLET | Freq: Two times a day (BID) | ORAL | 0 refills | Status: DC

## 2018-04-06 MED ORDER — PREDNISONE 20 MG PO TABS: ORAL_TABLET | ORAL | 0 refills | Status: DC

## 2018-04-06 NOTE — Progress Notes (Signed)
   Subjective:    Patient ID: Abigail Moss, female    DOB: 1946/05/03, 72 y.o.   MRN: [id number]   Chief Complaint: Sinus Problem   HPI Patient comes in today c/o congestion, sore throat, and facial pressure. Has been going on for 8 days and is no better. Has tried mucinex and tylenol cold and sinus.   Review of Systems  Constitutional: Negative for chills and fever.  HENT: Positive for congestion, rhinorrhea, sinus pressure and sore throat. Negative for ear pain, trouble swallowing and voice change.   Respiratory: Positive for cough (productive- yellow).   Cardiovascular: Negative.   Gastrointestinal: Negative.   Neurological: Negative for headaches.  Psychiatric/Behavioral: Negative.   All other systems reviewed and are negative.      Objective:   Physical Exam  Constitutional: She is oriented to person, place, and time. She appears well-developed and well-nourished. No distress.  HENT:  Right Ear: Hearing and external ear normal. A foreign body (vent tube) is present.  Left Ear: Hearing and external ear normal. A foreign body (vent tube) is present.  Nose: Mucosal edema and rhinorrhea present. Right sinus exhibits maxillary sinus tenderness. Right sinus exhibits no frontal sinus tenderness. Left sinus exhibits maxillary sinus tenderness. Left sinus exhibits no frontal sinus tenderness.  Mouth/Throat: Uvula is midline, oropharynx is clear and moist and mucous membranes are normal.  Neck: Normal range of motion. Neck supple.  Cardiovascular: Normal rate and regular rhythm.  Pulmonary/Chest: Effort normal.  Lymphadenopathy:    She has no cervical adenopathy.  Neurological: She is alert and oriented to person, place, and time.  Skin: Skin is warm.  Psychiatric: She has a normal mood and affect. Her behavior is normal. Thought content normal.  Nursing note and vitals reviewed.  BP 133/89   Pulse 74   Temp (!) 97.2 F (36.2 C) (Oral)   Ht 5\' 6"  (1.676 m)   Wt 188 lb (85.3  kg)   BMI 30.34 kg/m       Assessment & Plan:  Abigail Moss in today with chief complaint of Sinus Problem   1. Acute recurrent maxillary sinusitis 1. Take meds as prescribed 2. Use a cool mist humidifier especially during the winter months and when heat has been humid. 3. Use saline nose sprays frequently 4. Saline irrigations of the nose can be very helpful if done frequently.  * 4X daily for 1 week*  * Use of a nettie pot can be helpful with this. Follow directions with this* 5. Drink plenty of fluids 6. Keep thermostat turn down low 7.For any cough or congestion  Use plain Mucinex- regular strength or max strength is fine   * Children- consult with Pharmacist for dosing 8. For fever or aces or pains- take tylenol or ibuprofen appropriate for age and weight.  * for fevers greater than 101 orally you may alternate ibuprofen and tylenol every  3 hours.    - amoxicillin (AMOXIL) 875 MG tablet; Take 1 tablet (875 mg total) by mouth 2 (two) times daily. 1 po BID  Dispense: 20 tablet; Refill: 0 - predniSONE (DELTASONE) 20 MG tablet; 2 po at sametime daily for 5 days  Dispense: 10 tablet; Refill: 0  Mary-Margaret Daphine Deutscher, FNP

## 2018-04-06 NOTE — Patient Instructions (Signed)

## 2018-04-13 ENCOUNTER — Other Ambulatory Visit: Payer: Self-pay | Admitting: Family

## 2018-04-13 DIAGNOSIS — M19071 Primary osteoarthritis, right ankle and foot: Secondary | ICD-10-CM

## 2018-04-28 ENCOUNTER — Ambulatory Visit (INDEPENDENT_AMBULATORY_CARE_PROVIDER_SITE_OTHER): Payer: Medicare Other | Admitting: Family

## 2018-04-28 ENCOUNTER — Encounter: Payer: Self-pay | Admitting: Family

## 2018-04-28 VITALS — BP 122/77 | HR 89 | Temp 97.5°F | Ht 66.0 in | Wt 185.6 lb

## 2018-04-28 DIAGNOSIS — J209 Acute bronchitis, unspecified: Secondary | ICD-10-CM | POA: Diagnosis not present

## 2018-04-28 DIAGNOSIS — F411 Generalized anxiety disorder: Secondary | ICD-10-CM | POA: Diagnosis not present

## 2018-04-28 MED ORDER — ALPRAZOLAM 0.5 MG PO TABS: 0.5000 mg | ORAL_TABLET | Freq: Every evening | ORAL | 3 refills | Status: DC | PRN

## 2018-04-28 MED ORDER — PREDNISONE 10 MG (21) PO TBPK: ORAL_TABLET | ORAL | 0 refills | Status: DC

## 2018-04-28 NOTE — Patient Instructions (Signed)

## 2018-04-28 NOTE — Progress Notes (Signed)
   Subjective:    Patient ID: Abigail Moss, female    DOB: [birth year], 72 y.o.   MRN: [id number]  Chief Complaint  Patient presents with  . cough and congestion    Cough  This is a new problem. The current episode started yesterday. The problem has been gradually worsening. The problem occurs every few minutes. The cough is non-productive. Associated symptoms include chills, headaches, nasal congestion, postnasal drip, rhinorrhea, a sore throat and wheezing. Pertinent negatives include no ear congestion, ear pain, fever, myalgias or shortness of breath. She has tried rest for the symptoms. The treatment provided mild relief.  Anxiety  Presents for follow-up visit. Symptoms include decreased concentration, excessive worry and nervous/anxious behavior. Patient reports no shortness of breath. Symptoms occur most days. The severity of symptoms is moderate.        Review of Systems  Constitutional: Positive for chills. Negative for fever.  HENT: Positive for postnasal drip, rhinorrhea and sore throat. Negative for ear pain.   Respiratory: Positive for cough and wheezing. Negative for shortness of breath.   Musculoskeletal: Negative for myalgias.  Neurological: Positive for headaches.  Psychiatric/Behavioral: Positive for decreased concentration. The patient is nervous/anxious.   All other systems reviewed and are negative.      Objective:   Physical Exam  Constitutional: She is oriented to person, place, and time. She appears well-developed and well-nourished. No distress.  HENT:  Head: Normocephalic and atraumatic.  Right Ear: External ear normal.  Left Ear: External ear normal.  Mouth/Throat: Oropharynx is clear and moist.  Eyes: Pupils are equal, round, and reactive to light.  Neck: Normal range of motion. Neck supple. No thyromegaly present.  Cardiovascular: Normal rate, regular rhythm, normal heart sounds and intact distal pulses.  No murmur heard. Pulmonary/Chest: Effort  normal. No respiratory distress. She has decreased breath sounds. She has no wheezes.  Abdominal: Soft. Bowel sounds are normal. She exhibits no distension. There is no tenderness.  Musculoskeletal: Normal range of motion. She exhibits no edema or tenderness.  Neurological: She is alert and oriented to person, place, and time. She has normal reflexes. No cranial nerve deficit.  Skin: Skin is warm and dry.  Psychiatric: She has a normal mood and affect. Her behavior is normal. Judgment and thought content normal.  Vitals reviewed.    BP 122/77   Pulse 89   Temp (!) 97.5 F (36.4 C) (Oral)   Ht 5\' 6"  (1.676 m)   Wt 185 lb 9.6 oz (84.2 kg)   BMI 29.96 kg/m      Assessment & Plan:  Abigail Moss comes in today with chief complaint of cough and congestion   Diagnosis and orders addressed:  1. Acute bronchitis, unspecified organism - Take meds as prescribed - Use a cool mist humidifier  -Use saline nose sprays frequently -Force fluids -For any cough or congestion  Use plain Mucinex- regular strength or max strength is fine -For fever or aces or pains- take tylenol or ibuprofen. -Throat lozenges if help -RTO if symptoms worsen or do not improve - predniSONE (STERAPRED UNI-PAK 21 TAB) 10 MG (21) TBPK tablet; Use as directed  Dispense: 21 tablet; Refill: 0  2. GAD (generalized anxiety disorder) Stress management discussed - ALPRAZolam (XANAX) 0.5 MG tablet; Take 1 tablet (0.5 mg total) by mouth at bedtime as needed.  Dispense: 30 tablet; Refill: 3   Jannifer Rodney, FNP

## 2018-05-03 ENCOUNTER — Telehealth: Payer: Self-pay | Admitting: Family

## 2018-05-03 MED ORDER — AMOXICILLIN-POT CLAVULANATE 875-125 MG PO TABS: 1.0000 | ORAL_TABLET | Freq: Two times a day (BID) | ORAL | 0 refills | Status: DC

## 2018-05-03 NOTE — Telephone Encounter (Signed)
Augmentin Prescription sent to pharmacy   

## 2018-05-03 NOTE — Telephone Encounter (Signed)
Pt aware.

## 2018-05-06 ENCOUNTER — Encounter: Payer: Self-pay | Admitting: Family

## 2018-05-06 ENCOUNTER — Ambulatory Visit (INDEPENDENT_AMBULATORY_CARE_PROVIDER_SITE_OTHER): Payer: Medicare Other | Admitting: Family

## 2018-05-06 ENCOUNTER — Other Ambulatory Visit: Payer: Self-pay | Admitting: Family

## 2018-05-06 ENCOUNTER — Ambulatory Visit (INDEPENDENT_AMBULATORY_CARE_PROVIDER_SITE_OTHER): Payer: Medicare Other

## 2018-05-06 VITALS — BP 133/87 | HR 80 | Temp 98.5°F

## 2018-05-06 DIAGNOSIS — R05 Cough: Secondary | ICD-10-CM

## 2018-05-06 DIAGNOSIS — R079 Chest pain, unspecified: Secondary | ICD-10-CM | POA: Diagnosis not present

## 2018-05-06 DIAGNOSIS — R059 Cough, unspecified: Secondary | ICD-10-CM

## 2018-05-06 DIAGNOSIS — S29011A Strain of muscle and tendon of front wall of thorax, initial encounter: Secondary | ICD-10-CM

## 2018-05-06 DIAGNOSIS — J40 Bronchitis, not specified as acute or chronic: Secondary | ICD-10-CM

## 2018-05-06 DIAGNOSIS — K449 Diaphragmatic hernia without obstruction or gangrene: Secondary | ICD-10-CM

## 2018-05-06 MED ORDER — PREDNISONE 10 MG (21) PO TBPK: ORAL_TABLET | ORAL | 0 refills | Status: DC

## 2018-05-06 NOTE — Patient Instructions (Signed)

## 2018-05-06 NOTE — Progress Notes (Signed)
Subjective:    Patient ID: Abigail PASZKIEWICZ, female    DOB: 1945-10-28, 72 y.o.   MRN: [id number]  Chief Complaint  Patient presents with  . cough and chest congestion   PT presents to the office today with recurrent cough and bilateral chest pain. She is currently on Augmentin that she started on Monday. She states she was feeling better, but then on Tuesday she was lifting "heavy boxes" and noticed the pain on Wednesday morning. She only has SOB with coughing. Denies chest pain with exertion.  Cough  This is a recurrent problem. The current episode started 1 to 4 weeks ago. The problem has been waxing and waning. The problem occurs every few minutes. The cough is non-productive. Associated symptoms include chills, ear pain, headaches, myalgias, nasal congestion, a sore throat and shortness of breath. Pertinent negatives include no ear congestion, fever or wheezing. The symptoms are aggravated by lying down. She has tried rest for the symptoms. The treatment provided mild relief.      Review of Systems  Constitutional: Positive for chills. Negative for fever.  HENT: Positive for ear pain and sore throat.   Respiratory: Positive for cough and shortness of breath. Negative for wheezing.   Musculoskeletal: Positive for myalgias.  Neurological: Positive for headaches.  All other systems reviewed and are negative.      Objective:   Physical Exam  Constitutional: She is oriented to person, place, and time. She appears well-developed and well-nourished. No distress.  HENT:  Head: Normocephalic and atraumatic.  Right Ear: External ear normal.  Left Ear: External ear normal.  Mouth/Throat: Oropharynx is clear and moist.  Eyes: Pupils are equal, round, and reactive to light.  Neck: Normal range of motion. Neck supple. No thyromegaly present.  Cardiovascular: Normal rate, regular rhythm, normal heart sounds and intact distal pulses.  No murmur heard. Pulmonary/Chest: Effort normal. No  respiratory distress. She has decreased breath sounds. She has no wheezes.  Abdominal: Soft. Bowel sounds are normal. She exhibits no distension. There is no tenderness.  Musculoskeletal: Normal range of motion. She exhibits no edema or tenderness.  Pain in right thoracic  spine with abduction of bilateral shoulders  Neurological: She is alert and oriented to person, place, and time. She has normal reflexes. No cranial nerve deficit.  Skin: Skin is warm and dry.  Psychiatric: She has a normal mood and affect. Her behavior is normal. Judgment and thought content normal.  Vitals reviewed.   Chest x-ray- Abnormal shadow over heart? Will wait for final reading from radiologists, Preliminary reading by Jannifer Rodney, FNP [facility]   BP 133/87   Pulse 80   Temp 98.5 F (36.9 C) (Oral)      Assessment & Plan:  TERIA KHACHATRYAN comes in today with chief complaint of cough and chest congestion   Diagnosis and orders addressed:  1. Bronchitis - DG Chest 2 View; Future - predniSONE (STERAPRED UNI-PAK 21 TAB) 10 MG (21) TBPK tablet; Use as directed  Dispense: 21 tablet; Refill: 0  2. Chest wall muscle strain, initial encounter - DG Chest 2 View; Future - predniSONE (STERAPRED UNI-PAK 21 TAB) 10 MG (21) TBPK tablet; Use as directed  Dispense: 21 tablet; Refill: 0  3. Coughing - DG Chest 2 View; Future - predniSONE (STERAPRED UNI-PAK 21 TAB) 10 MG (21) TBPK tablet; Use as directed  Dispense: 21 tablet; Refill: 0  4. Chest pain, unspecified type - EKG 12-Lead   Believe this chest pain is MSK related  from straining and coughing Rest Prednisone  RTO if symptoms worsen or do not improve   Jannifer Rodney, FNP

## 2018-05-07 ENCOUNTER — Other Ambulatory Visit: Payer: Self-pay | Admitting: Family

## 2018-05-07 DIAGNOSIS — M810 Age-related osteoporosis without current pathological fracture: Secondary | ICD-10-CM

## 2018-05-10 DIAGNOSIS — H2513 Age-related nuclear cataract, bilateral: Secondary | ICD-10-CM | POA: Diagnosis not present

## 2018-05-10 DIAGNOSIS — H40033 Anatomical narrow angle, bilateral: Secondary | ICD-10-CM | POA: Diagnosis not present

## 2018-05-18 ENCOUNTER — Ambulatory Visit (INDEPENDENT_AMBULATORY_CARE_PROVIDER_SITE_OTHER): Payer: Medicare Other | Admitting: Family Medicine

## 2018-05-18 ENCOUNTER — Encounter: Payer: Self-pay | Admitting: Family Medicine

## 2018-05-18 VITALS — BP 116/72 | HR 82 | Temp 99.0°F | Ht 66.0 in | Wt 187.0 lb

## 2018-05-18 DIAGNOSIS — J0101 Acute recurrent maxillary sinusitis: Secondary | ICD-10-CM

## 2018-05-18 MED ORDER — METHYLPREDNISOLONE ACETATE 40 MG/ML IJ SUSP
40.0000 mg | Freq: Once | INTRAMUSCULAR | Status: AC
  Administered 2018-05-18: 40 mg via INTRAMUSCULAR

## 2018-05-18 MED ORDER — DOXYCYCLINE HYCLATE 100 MG PO TABS: 100.0000 mg | ORAL_TABLET | Freq: Two times a day (BID) | ORAL | 0 refills | Status: AC

## 2018-05-18 NOTE — Progress Notes (Signed)
  Subjective:     Abigail Moss is a 71 y.o. female who presents for evaluation of sinus pain. Symptoms include: congestion, cough, facial pain, nasal congestion, post nasal drip, puffiness of the eyes, sinus pressure, sore throat, tooth pain and increased facial pain with bending over. Onset of symptoms was 3 weeks ago. Symptoms have been gradually worsening since that time. Past history is significant for occasional episodes of bronchitis and allergic rhinitis . Patient is a former smoker, quit 21 years ago.  The following portions of the patient's history were reviewed and updated as appropriate: allergies, current medications, past family history, past medical history, past social history, past surgical history and problem list.  Review of Systems Constitutional: positive for chills, fatigue and fevers Eyes: positive for redness Ears, nose, mouth, throat, and face: positive for nasal congestion, sore throat and sinus pressure Respiratory: positive for cough and sputum Cardiovascular: negative Gastrointestinal: negative Neurological: positive for headaches   Objective:    BP 116/72   Pulse 82   Temp 99 F (37.2 C) (Oral)   Ht 5\' 6"  (1.676 m)   Wt 187 lb (84.8 kg)   BMI 30.18 kg/m  General appearance: alert, cooperative, appears stated age and mild distress Head: Normocephalic, without obvious abnormality, atraumatic Eyes: negative Ears: normal TM's and external ear canals both ears Nose: Nares normal. Septum midline. Mucosa normal. No drainage or sinus tenderness., purulent discharge, mild congestion, turbinates red, swollen, sinus tenderness bilateral, mild maxillary sinus tenderness bilateral Throat: abnormal findings: mild oropharyngeal erythema Neck: no adenopathy, no carotid bruit, no JVD, supple, symmetrical, trachea midline and thyroid not enlarged, symmetric, no tenderness/mass/nodules Lungs: clear to auscultation bilaterally Heart: regular rate and rhythm, S1, S2 normal,  no murmur, click, rub or gallop Skin: Skin color, texture, turgor normal. No rashes or lesions Neurologic: Grossly normal    Assessment:    Acute bacterial sinusitis.    Acute recurrent maxillary sinusitis - Plan: doxycycline (VIBRA-TABS) 100 MG tablet, methylPREDNISolone acetate (DEPO-MEDROL) injection 40 mg     Plan:    Nasal saline sprays. Neti pot recommended. Instructions given. Nasal steroids per medication orders. Antihistamines per medication orders. Doxycline due to recent augmentin per medication orders. Follow up in 4 weeks or as needed.    The above assessment and management plan was discussed with the patient. The patient verbalized understanding of and has agreed to the management plan. Patient is aware to call the clinic if symptoms fail to improve or worsen. Patient is aware when to return to the clinic for a follow-up visit. Patient educated on when it is appropriate to go to the emergency department. \  Kari Baars, FNP-C [facility] [address] [place], [place] [address] [phone]

## 2018-05-18 NOTE — Patient Instructions (Signed)
1. Take meds as prescribed. 2. Use a cool mist humidifier especially during the winter months when humid outside. 3. Use saline nose sprays frequently. 4. Saline irrigations of the nose can be very helpful if done frequently:  4 times daily for 1 week.  Use of a nettie pot can be helpful with this. Follow directions with this and DO NOT use tap water.  5. Drink plenty of fluids. 6. Keep heat in house lower.  7.For any cough or congestion:  Use plain Mucinex- regular strength or max strength is fine.  Children - consult pharmacist for dosing. 8. For fever or aces or pains- take tylenol or ibuprofen appropriate for age and weight:  For fevers greater than 101 orally you may alternate ibuprofen and tylenol every 3 hours.     Sinusitis, Adult Sinusitis is soreness and inflammation of your sinuses. Sinuses are hollow spaces in the bones around your face. They are located:  Around your eyes.  In the middle of your forehead.  Behind your nose.  In your cheekbones.  Your sinuses and nasal passages are lined with a stringy fluid (mucus). Mucus normally drains out of your sinuses. When your nasal tissues get inflamed or swollen, the mucus can get trapped or blocked so air cannot flow through your sinuses. This lets bacteria, viruses, and funguses grow, and that leads to infection. Follow these instructions at home: Medicines  Take, use, or apply over-the-counter and prescription medicines only as told by your doctor. These may include nasal sprays.  If you were prescribed an antibiotic medicine, take it as told by your doctor. Do not stop taking the antibiotic even if you start to feel better. Hydrate and Humidify  Drink enough water to keep your pee (urine) clear or pale yellow.  Use a cool mist humidifier to keep the humidity level in your home above 50%.  Breathe in steam for 10-15 minutes, 3-4 times a day or as told by your doctor. You can do this in the bathroom while a hot shower  is running.  Try not to spend time in cool or dry air. Rest  Rest as much as possible.  Sleep with your head raised (elevated).  Make sure to get enough sleep each night. General instructions  Put a warm, moist washcloth on your face 3-4 times a day or as told by your doctor. This will help with discomfort.  Wash your hands often with soap and water. If there is no soap and water, use hand sanitizer.  Do not smoke. Avoid being around people who are smoking (secondhand smoke).  Keep all follow-up visits as told by your doctor. This is important. Contact a doctor if:  You have a fever.  Your symptoms get worse.  Your symptoms do not get better within 10 days. Get help right away if:  You have a very bad headache.  You cannot stop throwing up (vomiting).  You have pain or swelling around your face or eyes.  You have trouble seeing.  You feel confused.  Your neck is stiff.  You have trouble breathing. This information is not intended to replace advice given to you by your health care provider. Make sure you discuss any questions you have with your health care provider. Document Released: 12/10/2007 Document Revised: 02/17/2016 Document Reviewed: 04/18/2015 Elsevier Interactive Patient Education  2018 [employer]  

## 2018-05-26 DIAGNOSIS — K449 Diaphragmatic hernia without obstruction or gangrene: Secondary | ICD-10-CM | POA: Diagnosis not present

## 2018-06-15 ENCOUNTER — Other Ambulatory Visit: Payer: Self-pay | Admitting: Family

## 2018-06-15 DIAGNOSIS — M79675 Pain in left toe(s): Secondary | ICD-10-CM | POA: Diagnosis not present

## 2018-06-15 DIAGNOSIS — K219 Gastro-esophageal reflux disease without esophagitis: Secondary | ICD-10-CM

## 2018-06-15 DIAGNOSIS — M2041 Other hammer toe(s) (acquired), right foot: Secondary | ICD-10-CM | POA: Diagnosis not present

## 2018-06-19 ENCOUNTER — Other Ambulatory Visit: Payer: Self-pay | Admitting: Family

## 2018-06-19 DIAGNOSIS — I10 Essential (primary) hypertension: Secondary | ICD-10-CM

## 2018-06-22 ENCOUNTER — Ambulatory Visit: Payer: Medicare Other | Admitting: Family

## 2018-07-12 ENCOUNTER — Other Ambulatory Visit: Payer: Self-pay | Admitting: Family

## 2018-07-12 DIAGNOSIS — F411 Generalized anxiety disorder: Secondary | ICD-10-CM

## 2018-07-15 ENCOUNTER — Ambulatory Visit (INDEPENDENT_AMBULATORY_CARE_PROVIDER_SITE_OTHER): Payer: Medicare Other | Admitting: Family

## 2018-07-15 ENCOUNTER — Encounter: Payer: Self-pay | Admitting: Family

## 2018-07-15 VITALS — BP 134/84 | HR 76 | Temp 97.3°F | Ht 66.0 in | Wt 187.0 lb

## 2018-07-15 DIAGNOSIS — Z79899 Other long term (current) drug therapy: Secondary | ICD-10-CM | POA: Insufficient documentation

## 2018-07-15 DIAGNOSIS — E039 Hypothyroidism, unspecified: Secondary | ICD-10-CM

## 2018-07-15 DIAGNOSIS — F1320 Sedative, hypnotic or anxiolytic dependence, uncomplicated: Secondary | ICD-10-CM

## 2018-07-15 DIAGNOSIS — I10 Essential (primary) hypertension: Secondary | ICD-10-CM | POA: Diagnosis not present

## 2018-07-15 DIAGNOSIS — J0101 Acute recurrent maxillary sinusitis: Secondary | ICD-10-CM

## 2018-07-15 DIAGNOSIS — F411 Generalized anxiety disorder: Secondary | ICD-10-CM

## 2018-07-15 DIAGNOSIS — K219 Gastro-esophageal reflux disease without esophagitis: Secondary | ICD-10-CM | POA: Diagnosis not present

## 2018-07-15 DIAGNOSIS — E559 Vitamin D deficiency, unspecified: Secondary | ICD-10-CM

## 2018-07-15 DIAGNOSIS — E669 Obesity, unspecified: Secondary | ICD-10-CM

## 2018-07-15 MED ORDER — AMOXICILLIN-POT CLAVULANATE 875-125 MG PO TABS: 1.0000 | ORAL_TABLET | Freq: Two times a day (BID) | ORAL | 0 refills | Status: DC

## 2018-07-15 MED ORDER — ALPRAZOLAM 0.5 MG PO TABS: 0.5000 mg | ORAL_TABLET | Freq: Every evening | ORAL | 3 refills | Status: DC | PRN

## 2018-07-15 NOTE — Progress Notes (Signed)
Subjective:    Patient ID: Abigail Moss, female    DOB: 02/25/1946, 73 y.o.   MRN: [id number]  Chief Complaint  Patient presents with  . Medical Management of Chronic Issues  . Sinus Problem    Sinus Problem  This is a recurrent problem. The current episode started in the past 7 days. The problem has been gradually worsening since onset. Her pain is at a severity of 5/10. The pain is mild. Associated symptoms include congestion, coughing, ear pain, a hoarse voice and sinus pressure. Pertinent negatives include no shortness of breath or sore throat. Past treatments include oral decongestants. The treatment provided mild relief.  Hypertension  This is a chronic problem. The current episode started more than 1 year ago. The problem has been resolved since onset. The problem is controlled. Associated symptoms include anxiety and malaise/fatigue. Pertinent negatives include no peripheral edema or shortness of breath. Risk factors for coronary artery disease include dyslipidemia and sedentary lifestyle. The current treatment provides moderate improvement. Identifiable causes of hypertension include a thyroid problem.  Gastroesophageal Reflux  She complains of coughing, heartburn and a hoarse voice. She reports no belching or no sore throat. This is a chronic problem. The problem occurs occasionally. The problem has been waxing and waning. Associated symptoms include fatigue. She has tried a PPI for the symptoms. The treatment provided moderate relief.  Thyroid Problem  Presents for follow-up visit. Symptoms include anxiety, fatigue and hoarse voice. Patient reports no constipation, depressed mood or dry skin. The symptoms have been stable.  Anxiety  Presents for follow-up visit. Symptoms include excessive worry, irritability and nervous/anxious behavior. Patient reports no depressed mood, insomnia or shortness of breath. Symptoms occur occasionally. The severity of symptoms is moderate. The quality of  sleep is good.        Review of Systems  Constitutional: Positive for fatigue, irritability and malaise/fatigue.  HENT: Positive for congestion, ear pain, hoarse voice and sinus pressure. Negative for sore throat.   Respiratory: Positive for cough. Negative for shortness of breath.   Gastrointestinal: Positive for heartburn. Negative for constipation.  Psychiatric/Behavioral: The patient is nervous/anxious. The patient does not have insomnia.   All other systems reviewed and are negative.      Objective:   Physical Exam Vitals signs reviewed.  Constitutional:      General: She is not in acute distress.    Appearance: She is well-developed.  HENT:     Head: Normocephalic and atraumatic.     Right Ear: External ear normal. Drainage (tube in pace) and tenderness present. A PE tube is present.     Left Ear: A PE tube is present.  Eyes:     Pupils: Pupils are equal, round, and reactive to light.  Neck:     Musculoskeletal: Normal range of motion and neck supple.     Thyroid: No thyromegaly.  Cardiovascular:     Rate and Rhythm: Normal rate and regular rhythm.     Heart sounds: Normal heart sounds. No murmur.  Pulmonary:     Effort: Pulmonary effort is normal. No respiratory distress.     Breath sounds: Normal breath sounds. No wheezing.  Abdominal:     General: Bowel sounds are normal. There is no distension.     Palpations: Abdomen is soft.     Tenderness: There is no abdominal tenderness.  Musculoskeletal: Normal range of motion.        General: No tenderness.  Skin:    General: Skin is  warm and dry.  Neurological:     Mental Status: She is alert and oriented to person, place, and time.     Cranial Nerves: No cranial nerve deficit.     Deep Tendon Reflexes: Reflexes are normal and symmetric.  Psychiatric:        Behavior: Behavior normal.        Thought Content: Thought content normal.        Judgment: Judgment normal.       BP 134/84   Pulse 76   Temp (!)  97.3 F (36.3 C) (Oral)   Ht _0  (1.676 m)   Wt 187 lb (84.8 kg)   BMI 30.18 kg/m      Assessment & Plan:  Abigail Moss comes in today with chief complaint of Medical Management of Chronic Issues and Sinus Problem   Diagnosis and orders addressed:  1. Essential hypertension - CMP14+EGFR - CBC with Differential/Platelet  2. Gastroesophageal reflux disease, esophagitis presence not specified - CMP14+EGFR - CBC with Differential/Platelet  3. Hypothyroidism, unspecified type - CMP14+EGFR - CBC with Differential/Platelet - TSH  4. GAD (generalized anxiety disorder) - CMP14+EGFR - CBC with Differential/Platelet - ALPRAZolam (XANAX) 0.5 MG tablet; Take 1 tablet (0.5 mg total) by mouth at bedtime as needed.  Dispense: 30 tablet; Refill: 3  5. Obesity (BMI 30-39.9) - CMP14+EGFR - CBC with Differential/Platelet  6. Vitamin D deficiency - CMP14+EGFR - CBC with Differential/Platelet  7. Benzodiazepine dependence ([place])  8. Controlled substance agreement signed  9. Acute recurrent maxillary sinusitis - Take meds as prescribed - Use a cool mist humidifier  -Use saline nose sprays frequently -Force fluids -For any cough or congestion  Use plain Mucinex- regular strength or max strength is fine -For fever or aces or pains- take tylenol or ibuprofen. -Throat lozenges if help - amoxicillin-clavulanate (AUGMENTIN) 875-125 MG tablet; Take 1 tablet by mouth 2 (two) times daily.  Dispense: 14 tablet; Refill: 0   Labs pending [place] controlled database reviewed- No red flags noted Health Maintenance reviewed Diet and exercise encouraged  Follow up plan: 6 months    [name], FNP

## 2018-07-15 NOTE — Patient Instructions (Signed)
Sinusitis, Adult  Sinusitis is inflammation of your sinuses. Sinuses are hollow spaces in the bones around your face. Your sinuses are located:   Around your eyes.   In the middle of your forehead.   Behind your nose.   In your cheekbones.  Mucus normally drains out of your sinuses. When your nasal tissues become inflamed or swollen, mucus can become trapped or blocked. This allows bacteria, viruses, and fungi to grow, which leads to infection. Most infections of the sinuses are caused by a virus.  Sinusitis can develop quickly. It can last for up to 4 weeks (acute) or for more than 12 weeks (chronic). Sinusitis often develops after a cold.  What are the causes?  This condition is caused by anything that creates swelling in the sinuses or stops mucus from draining. This includes:   Allergies.   Asthma.   Infection from bacteria or viruses.   Deformities or blockages in your nose or sinuses.   Abnormal growths in the nose (nasal polyps).   Pollutants, such as chemicals or irritants in the air.   Infection from fungi (rare).  What increases the risk?  You are more likely to develop this condition if you:   Have a weak body defense system (immune system).   Do a lot of swimming or diving.   Overuse nasal sprays.   Smoke.  What are the signs or symptoms?  The main symptoms of this condition are pain and a feeling of pressure around the affected sinuses. Other symptoms include:   Stuffy nose or congestion.   Thick drainage from your nose.   Swelling and warmth over the affected sinuses.   Headache.   Upper toothache.   A cough that may get worse at night.   Extra mucus that collects in the throat or the back of the nose (postnasal drip).   Decreased sense of smell and taste.   Fatigue.   A fever.   Sore throat.   Bad breath.  How is this diagnosed?  This condition is diagnosed based on:   Your symptoms.   Your medical history.   A physical exam.   Tests to find out if your condition is  acute or chronic. This may include:  ? Checking your nose for nasal polyps.  ? Viewing your sinuses using a device that has a light (endoscope).  ? Testing for allergies or bacteria.  ? Imaging tests, such as an MRI or CT scan.  In rare cases, a bone biopsy may be done to rule out more serious types of fungal sinus disease.  How is this treated?  Treatment for sinusitis depends on the cause and whether your condition is chronic or acute.   If caused by a virus, your symptoms should go away on their own within 10 days. You may be given medicines to relieve symptoms. They include:  ? Medicines that shrink swollen nasal passages (topical intranasal decongestants).  ? Medicines that treat allergies (antihistamines).  ? A spray that eases inflammation of the nostrils (topical intranasal corticosteroids).  ? Rinses that help get rid of thick mucus in your nose (nasal saline washes).   If caused by bacteria, your health care provider may recommend waiting to see if your symptoms improve. Most bacterial infections will get better without antibiotic medicine. You may be given antibiotics if you have:  ? A severe infection.  ? A weak immune system.   If caused by narrow nasal passages or nasal polyps, you may need   to have surgery.  Follow these instructions at home:  Medicines   Take, use, or apply over-the-counter and prescription medicines only as told by your health care provider. These may include nasal sprays.   If you were prescribed an antibiotic medicine, take it as told by your health care provider. Do not stop taking the antibiotic even if you start to feel better.  Hydrate and humidify     Drink enough fluid to keep your urine pale yellow. Staying hydrated will help to thin your mucus.   Use a cool mist humidifier to keep the humidity level in your home above 50%.   Inhale steam for 10-15 minutes, 3-4 times a day, or as told by your health care provider. You can do this in the bathroom while a hot shower is  running.   Limit your exposure to cool or dry air.  Rest   Rest as much as possible.   Sleep with your head raised (elevated).   Make sure you get enough sleep each night.  General instructions     Apply a warm, moist washcloth to your face 3-4 times a day or as told by your health care provider. This will help with discomfort.   Wash your hands often with soap and water to reduce your exposure to germs. If soap and water are not available, use hand sanitizer.   Do not smoke. Avoid being around people who are smoking (secondhand smoke).   Keep all follow-up visits as told by your health care provider. This is important.  Contact a health care provider if:   You have a fever.   Your symptoms get worse.   Your symptoms do not improve within 10 days.  Get help right away if:   You have a severe headache.   You have persistent vomiting.   You have severe pain or swelling around your face or eyes.   You have vision problems.   You develop confusion.   Your neck is stiff.   You have trouble breathing.  Summary   Sinusitis is soreness and inflammation of your sinuses. Sinuses are hollow spaces in the bones around your face.   This condition is caused by nasal tissues that become inflamed or swollen. The swelling traps or blocks the flow of mucus. This allows bacteria, viruses, and fungi to grow, which leads to infection.   If you were prescribed an antibiotic medicine, take it as told by your health care provider. Do not stop taking the antibiotic even if you start to feel better.   Keep all follow-up visits as told by your health care provider. This is important.  This information is not intended to replace advice given to you by your health care provider. Make sure you discuss any questions you have with your health care provider.  Document Released: 06/23/2005 Document Revised: 11/23/2017 Document Reviewed: 11/23/2017  Elsevier Interactive Patient Education  2019 [employer]

## 2018-07-16 LAB — CBC WITH DIFFERENTIAL/PLATELET
Basophils Absolute: 0.1 10*3/uL (ref 0.0–0.2)
Basos: 1 %
EOS (ABSOLUTE): 0.1 10*3/uL (ref 0.0–0.4)
EOS: 2 %
HEMATOCRIT: 35.9 % (ref 34.0–46.6)
Hemoglobin: 12 g/dL (ref 11.1–15.9)
Immature Grans (Abs): 0 10*3/uL (ref 0.0–0.1)
Immature Granulocytes: 0 %
Lymphocytes Absolute: 2.3 10*3/uL (ref 0.7–3.1)
Lymphs: 36 %
MCH: 31.2 pg (ref 26.6–33.0)
MCHC: 33.4 g/dL (ref 31.5–35.7)
MCV: 93 fL (ref 79–97)
MONOCYTES: 8 %
Monocytes Absolute: 0.5 10*3/uL (ref 0.1–0.9)
Neutrophils Absolute: 3.4 10*3/uL (ref 1.4–7.0)
Neutrophils: 53 %
Platelets: 268 10*3/uL (ref 150–450)
RBC: 3.85 x10E6/uL (ref 3.77–5.28)
RDW: 13.4 % (ref 11.7–15.4)
WBC: 6.5 10*3/uL (ref 3.4–10.8)

## 2018-07-16 LAB — CMP14+EGFR
ALT: 17 IU/L (ref 0–32)
AST: 15 IU/L (ref 0–40)
Albumin/Globulin Ratio: 1.7 (ref 1.2–2.2)
Albumin: 4 g/dL (ref 3.5–4.8)
Alkaline Phosphatase: 56 IU/L (ref 39–117)
BUN/Creatinine Ratio: 17 (ref 12–28)
BUN: 15 mg/dL (ref 8–27)
Bilirubin Total: 0.3 mg/dL (ref 0.0–1.2)
CO2: 25 mmol/L (ref 20–29)
Calcium: 9.3 mg/dL (ref 8.7–10.3)
Chloride: 105 mmol/L (ref 96–106)
Creatinine, Ser: 0.87 mg/dL (ref 0.57–1.00)
GFR calc Af Amer: 76 mL/min/{1.73_m2} (ref >59)
GFR calc non Af Amer: 66 mL/min/{1.73_m2} (ref >59)
Globulin, Total: 2.4 g/dL (ref 1.5–4.5)
Glucose: 78 mg/dL (ref 65–99)
Potassium: 4 mmol/L (ref 3.5–5.2)
Sodium: 145 mmol/L — ABNORMAL HIGH (ref 134–144)
Total Protein: 6.4 g/dL (ref 6.0–8.5)

## 2018-07-16 LAB — TSH: TSH: 1.85 u[IU]/mL (ref 0.450–4.500)

## 2018-07-19 ENCOUNTER — Other Ambulatory Visit: Payer: Self-pay | Admitting: Family

## 2018-07-19 DIAGNOSIS — I10 Essential (primary) hypertension: Secondary | ICD-10-CM

## 2018-07-20 ENCOUNTER — Other Ambulatory Visit: Payer: Self-pay | Admitting: Family Medicine

## 2018-09-17 ENCOUNTER — Other Ambulatory Visit: Payer: Self-pay | Admitting: Family

## 2018-09-17 DIAGNOSIS — K219 Gastro-esophageal reflux disease without esophagitis: Secondary | ICD-10-CM

## 2018-09-23 ENCOUNTER — Other Ambulatory Visit: Payer: Self-pay | Admitting: Family

## 2018-09-23 DIAGNOSIS — I10 Essential (primary) hypertension: Secondary | ICD-10-CM

## 2018-09-23 MED ORDER — LOSARTAN POTASSIUM 100 MG PO TABS: 100.0000 mg | ORAL_TABLET | Freq: Every day | ORAL | 0 refills | Status: DC

## 2018-09-23 NOTE — Telephone Encounter (Signed)
Pt aware 90 d supply sent to pharmacy 

## 2018-09-23 NOTE — Telephone Encounter (Signed)
What is the name of the medication? lorsartan 90 days   Have you contacted your pharmacy to request a refill? Yes   Which pharmacy would you like this sent to? [facility]    Patient notified that their request is being sent to the clinical staff for review and that they should receive a call once it is complete. If they do not receive a call within 24 hours they can check with their pharmacy or our office.

## 2018-11-04 ENCOUNTER — Other Ambulatory Visit: Payer: Self-pay | Admitting: Family

## 2018-11-04 ENCOUNTER — Encounter: Payer: Self-pay | Admitting: Family

## 2018-11-04 ENCOUNTER — Other Ambulatory Visit: Payer: Self-pay

## 2018-11-04 ENCOUNTER — Ambulatory Visit (INDEPENDENT_AMBULATORY_CARE_PROVIDER_SITE_OTHER): Payer: Medicare Other | Admitting: Family

## 2018-11-04 DIAGNOSIS — R399 Unspecified symptoms and signs involving the genitourinary system: Secondary | ICD-10-CM | POA: Diagnosis not present

## 2018-11-04 DIAGNOSIS — M810 Age-related osteoporosis without current pathological fracture: Secondary | ICD-10-CM

## 2018-11-04 MED ORDER — CEPHALEXIN 500 MG PO CAPS: 500.0000 mg | ORAL_CAPSULE | Freq: Two times a day (BID) | ORAL | 0 refills | Status: DC

## 2018-11-04 NOTE — Progress Notes (Signed)
   Virtual Visit via telephone Note  I connected with Abigail Moss on 11/04/18 at 2:05 pm by telephone and verified that I am speaking with the correct person using two identifiers. Abigail Moss is currently located at home and no one is currently with her during visit. The provider, Jannifer Rodney, FNP is located in their office at time of visit.  I discussed the limitations, risks, security and privacy concerns of performing an evaluation and management service by telephone and the availability of in person appointments. I also discussed with the patient that there may be a patient responsible charge related to this service. The patient expressed understanding and agreed to proceed.   History and Present Illness:    Dysuria   This is a new problem. The current episode started in the past 7 days. The problem occurs every urination. The problem has been waxing and waning. The quality of the pain is described as burning. The pain is at a severity of 2/10. The pain is mild (pressure). There has been no fever. Associated symptoms include flank pain, frequency, hesitancy and urgency. Pertinent negatives include no discharge, hematuria, nausea or vomiting. She has tried increased fluids for the symptoms. The treatment provided no relief. Her past medical history is significant for recurrent UTIs.      Review of Systems  Gastrointestinal: Negative for nausea and vomiting.  Genitourinary: Positive for dysuria, flank pain, frequency, hesitancy and urgency. Negative for hematuria.  All other systems reviewed and are negative.      Observations/Objective: No SOB or distress noted   Assessment and Plan: ROBIE ECCLESTON comes in today with chief complaint of No chief complaint on file.   Diagnosis and orders addressed:  1. UTI symptoms Force fluids AZO over the counter X2 days RTO prn - cephALEXin (KEFLEX) 500 MG capsule; Take 1 capsule (500 mg total) by mouth 2 (two) times daily.  Dispense: 14  capsule; Refill: 0      I discussed the assessment and treatment plan with the patient. The patient was provided an opportunity to ask questions and all were answered. The patient agreed with the plan and demonstrated an understanding of the instructions.   The patient was advised to call back or seek an in-person evaluation if the symptoms worsen or if the condition fails to improve as anticipated.  The above assessment and management plan was discussed with the patient. The patient verbalized understanding of and has agreed to the management plan. Patient is aware to call the clinic if symptoms persist or worsen. Patient is aware when to return to the clinic for a follow-up visit. Patient educated on when it is appropriate to go to the emergency department.    Call ended 2:13 pm, I provided 8 minutes of non-face-to-face time during this encounter.    Jannifer Rodney, FNP

## 2018-11-25 ENCOUNTER — Other Ambulatory Visit: Payer: Self-pay | Admitting: *Deleted

## 2018-11-25 DIAGNOSIS — K219 Gastro-esophageal reflux disease without esophagitis: Secondary | ICD-10-CM

## 2018-11-25 DIAGNOSIS — M810 Age-related osteoporosis without current pathological fracture: Secondary | ICD-10-CM

## 2018-11-25 MED ORDER — MONTELUKAST SODIUM 10 MG PO TABS: ORAL_TABLET | ORAL | 0 refills | Status: DC

## 2018-11-25 MED ORDER — OMEPRAZOLE 40 MG PO CPDR: DELAYED_RELEASE_CAPSULE | ORAL | 0 refills | Status: DC

## 2018-11-25 MED ORDER — ALENDRONATE SODIUM 70 MG PO TABS: ORAL_TABLET | ORAL | 0 refills | Status: DC

## 2018-12-08 ENCOUNTER — Other Ambulatory Visit: Payer: Self-pay | Admitting: *Deleted

## 2018-12-08 DIAGNOSIS — I10 Essential (primary) hypertension: Secondary | ICD-10-CM

## 2018-12-08 MED ORDER — LEVOTHYROXINE SODIUM 125 MCG PO TABS: 125.0000 ug | ORAL_TABLET | Freq: Every day | ORAL | 2 refills | Status: DC

## 2018-12-08 MED ORDER — LOSARTAN POTASSIUM 100 MG PO TABS: 100.0000 mg | ORAL_TABLET | Freq: Every day | ORAL | 0 refills | Status: DC

## 2018-12-10 ENCOUNTER — Other Ambulatory Visit: Payer: Self-pay | Admitting: Family

## 2018-12-10 DIAGNOSIS — K219 Gastro-esophageal reflux disease without esophagitis: Secondary | ICD-10-CM

## 2018-12-13 ENCOUNTER — Other Ambulatory Visit: Payer: Self-pay | Admitting: Family

## 2018-12-13 ENCOUNTER — Other Ambulatory Visit: Payer: Self-pay | Admitting: *Deleted

## 2018-12-13 DIAGNOSIS — M19071 Primary osteoarthritis, right ankle and foot: Secondary | ICD-10-CM

## 2018-12-13 DIAGNOSIS — F411 Generalized anxiety disorder: Secondary | ICD-10-CM

## 2018-12-13 MED ORDER — BUPROPION HCL ER (XL) 300 MG PO TB24: 300.0000 mg | ORAL_TABLET | Freq: Every day | ORAL | 1 refills | Status: DC

## 2018-12-13 MED ORDER — MELOXICAM 15 MG PO TABS: 15.0000 mg | ORAL_TABLET | Freq: Every day | ORAL | 1 refills | Status: DC

## 2019-01-04 DIAGNOSIS — M2041 Other hammer toe(s) (acquired), right foot: Secondary | ICD-10-CM | POA: Diagnosis not present

## 2019-01-04 DIAGNOSIS — M79671 Pain in right foot: Secondary | ICD-10-CM | POA: Diagnosis not present

## 2019-01-11 ENCOUNTER — Other Ambulatory Visit: Payer: Self-pay

## 2019-01-13 ENCOUNTER — Other Ambulatory Visit: Payer: Self-pay

## 2019-01-13 ENCOUNTER — Ambulatory Visit (INDEPENDENT_AMBULATORY_CARE_PROVIDER_SITE_OTHER): Payer: Medicare Other | Admitting: Family

## 2019-01-13 ENCOUNTER — Encounter: Payer: Self-pay | Admitting: Family

## 2019-01-13 VITALS — BP 133/93 | HR 76 | Temp 97.7°F | Ht 66.0 in | Wt 187.6 lb

## 2019-01-13 DIAGNOSIS — J309 Allergic rhinitis, unspecified: Secondary | ICD-10-CM

## 2019-01-13 DIAGNOSIS — M810 Age-related osteoporosis without current pathological fracture: Secondary | ICD-10-CM

## 2019-01-13 DIAGNOSIS — Z79899 Other long term (current) drug therapy: Secondary | ICD-10-CM

## 2019-01-13 DIAGNOSIS — J302 Other seasonal allergic rhinitis: Secondary | ICD-10-CM

## 2019-01-13 DIAGNOSIS — I10 Essential (primary) hypertension: Secondary | ICD-10-CM | POA: Diagnosis not present

## 2019-01-13 DIAGNOSIS — E559 Vitamin D deficiency, unspecified: Secondary | ICD-10-CM

## 2019-01-13 DIAGNOSIS — E669 Obesity, unspecified: Secondary | ICD-10-CM

## 2019-01-13 DIAGNOSIS — F411 Generalized anxiety disorder: Secondary | ICD-10-CM

## 2019-01-13 DIAGNOSIS — K219 Gastro-esophageal reflux disease without esophagitis: Secondary | ICD-10-CM

## 2019-01-13 DIAGNOSIS — F1320 Sedative, hypnotic or anxiolytic dependence, uncomplicated: Secondary | ICD-10-CM

## 2019-01-13 DIAGNOSIS — E039 Hypothyroidism, unspecified: Secondary | ICD-10-CM | POA: Diagnosis not present

## 2019-01-13 MED ORDER — FLUTICASONE PROPIONATE 50 MCG/ACT NA SUSP: 2.0000 | Freq: Every day | NASAL | 1 refills | Status: DC

## 2019-01-13 MED ORDER — ALPRAZOLAM 0.5 MG PO TABS: 0.5000 mg | ORAL_TABLET | Freq: Every evening | ORAL | 3 refills | Status: DC | PRN

## 2019-01-13 NOTE — Patient Instructions (Signed)

## 2019-01-13 NOTE — Progress Notes (Signed)
Subjective:    Patient ID: Abigail Moss, female    DOB: [birth year], 73 y.o.   MRN: [id number]  No chief complaint on file.  PT presents to the office today for chronic follow up.  Hypertension This is a chronic problem. The current episode started more than 1 year ago. The problem has been waxing and waning since onset. The problem is controlled. Associated symptoms include anxiety and malaise/fatigue. Pertinent negatives include no peripheral edema or shortness of breath. Risk factors for coronary artery disease include dyslipidemia, obesity and sedentary lifestyle. The current treatment provides moderate improvement. There is no history of kidney disease, CAD/MI, CVA or heart failure. Identifiable causes of hypertension include a thyroid problem.  Gastroesophageal Reflux She complains of heartburn. She reports no belching or no coughing. This is a chronic problem. The current episode started more than 1 year ago. The problem occurs frequently. The problem has been waxing and waning. Associated symptoms include fatigue. She has tried a PPI for the symptoms. The treatment provided moderate relief.  Thyroid Problem Presents for follow-up visit. Symptoms include anxiety, constipation and fatigue. Patient reports no diaphoresis or diarrhea. The symptoms have been stable. There is no history of heart failure.  Anxiety Presents for follow-up visit. Symptoms include decreased concentration, excessive worry, irritability, nervous/anxious behavior and restlessness. Patient reports no shortness of breath. Symptoms occur occasionally.    Sinusitis This is a recurrent problem. The current episode started in the past 7 days. The problem has been waxing and waning since onset. Associated symptoms include sinus pressure (gum pain) and sneezing. Pertinent negatives include no coughing, diaphoresis or shortness of breath. Past treatments include acetaminophen and spray decongestants. The treatment provided mild  relief.  Osteoporosis  Pt taking Fosamax as needed. Last Dexa scan 12/19/16.    Current opioids rx- Xanax 0.5 mg as needed # meds rx- 30 Effectiveness of current meds-Stable, only takes as needed, usually 30 tab last her 6 months Adverse reactions form pain meds-None Morphine equivalent- 1.0 LME  Pill count performed-No Last drug screen - n/a ( high risk q59m moderate risk q646mlow risk yearly ) Urine drug screen today- Yes [place]  If yes were their any concerning findings? - only gets filled every 6 months   No flowsheet data found.   Pain contract signed on:07/16/18   Review of Systems  Constitutional: Positive for fatigue, irritability and malaise/fatigue. Negative for diaphoresis.  HENT: Positive for sinus pressure (gum pain) and sneezing.   Respiratory: Negative for cough and shortness of breath.   Gastrointestinal: Positive for constipation and heartburn. Negative for diarrhea.  Psychiatric/Behavioral: Positive for decreased concentration. The patient is nervous/anxious.   All other systems reviewed and are negative.      Objective:   Physical Exam Vitals signs reviewed.  Constitutional:      General: She is not in acute distress.    Appearance: She is well-developed.  HENT:     Head: Normocephalic and atraumatic.     Right Ear: A PE tube is present.     Left Ear: Drainage present.     Nose: Nose normal.  Eyes:     Pupils: Pupils are equal, round, and reactive to light.  Neck:     Musculoskeletal: Normal range of motion and neck supple.     Thyroid: No thyromegaly.  Cardiovascular:     Rate and Rhythm: Normal rate and regular rhythm.     Heart sounds: Normal heart sounds. No murmur.  Pulmonary:     Effort: Pulmonary effort is normal. No respiratory distress.     Breath sounds: Normal breath sounds. No wheezing.  Abdominal:     General: Bowel sounds are normal. There is no distension.     Palpations: Abdomen is soft.     Tenderness:  There is no abdominal tenderness.  Musculoskeletal: Normal range of motion.        General: No tenderness.  Skin:    General: Skin is warm and dry.  Neurological:     Mental Status: She is alert and oriented to person, place, and time.     Cranial Nerves: No cranial nerve deficit.     Deep Tendon Reflexes: Reflexes are normal and symmetric.  Psychiatric:        Behavior: Behavior normal.        Thought Content: Thought content normal.        Judgment: Judgment normal.          BP (!) 133/93   Pulse 76   Temp 97.7 F (36.5 C) (Oral)   Ht _0  (1.676 m)   Wt 187 lb 9.6 oz (85.1 kg)   BMI 30.28 kg/m   Assessment & Plan:  Abigail Moss comes in today with chief complaint of Medical Management of Chronic Issues   Diagnosis and orders addressed:  1. Essential hypertension - CMP14+EGFR - CBC with Differential/Platelet  2. Allergic rhinitis, unspecified seasonality, unspecified trigger - CMP14+EGFR - CBC with Differential/Platelet  3. Gastroesophageal reflux disease, esophagitis presence not specified - CMP14+EGFR - CBC with Differential/Platelet  4. Hypothyroidism, unspecified type - CMP14+EGFR - CBC with Differential/Platelet - TSH  5. GAD (generalized anxiety disorder) - ALPRAZolam (XANAX) 0.5 MG tablet; Take 1 tablet (0.5 mg total) by mouth at bedtime as needed.  Dispense: 30 tablet; Refill: 3 - CMP14+EGFR - CBC with Differential/Platelet  6. Obesity (BMI 30-39.9) - CMP14+EGFR - CBC with Differential/Platelet  7. Controlled substance agreement signed - CMP14+EGFR - CBC with Differential/Platelet - ToxASSURE Select 13 (MW), Urine  8. Benzodiazepine dependence ([facility]) - CMP14+EGFR - CBC with Differential/Platelet - ToxASSURE Select 13 (MW), Urine  9. Seasonal allergic rhinitis - fluticasone (FLONASE) 50 MCG/ACT nasal spray; Place 2 sprays into both nostrils daily.  Dispense: 16 g; Refill: 1 - CMP14+EGFR - CBC with Differential/Platelet  10.  Osteoporosis, unspecified osteoporosis type, unspecified pathological fracture presence  11. Vitamin D deficiency - VITAMIN D 25 Hydroxy (Vit-D Deficiency, Fractures)   Labs pending Health Maintenance reviewed Diet and exercise encouraged  Follow up plan: 6 months    [name], FNP

## 2019-01-14 LAB — CBC WITH DIFFERENTIAL/PLATELET
Basophils Absolute: 0 10*3/uL (ref 0.0–0.2)
Basos: 1 %
EOS (ABSOLUTE): 0.2 10*3/uL (ref 0.0–0.4)
Eos: 4 %
Hematocrit: 37.5 % (ref 34.0–46.6)
Hemoglobin: 12.4 g/dL (ref 11.1–15.9)
Immature Grans (Abs): 0 10*3/uL (ref 0.0–0.1)
Immature Granulocytes: 0 %
Lymphocytes Absolute: 1.3 10*3/uL (ref 0.7–3.1)
Lymphs: 33 %
MCH: 31.2 pg (ref 26.6–33.0)
MCHC: 33.1 g/dL (ref 31.5–35.7)
MCV: 94 fL (ref 79–97)
Monocytes Absolute: 0.4 10*3/uL (ref 0.1–0.9)
Monocytes: 9 %
Neutrophils Absolute: 2.1 10*3/uL (ref 1.4–7.0)
Neutrophils: 53 %
Platelets: 247 10*3/uL (ref 150–450)
RBC: 3.98 x10E6/uL (ref 3.77–5.28)
RDW: 13.7 % (ref 11.7–15.4)
WBC: 3.9 10*3/uL (ref 3.4–10.8)

## 2019-01-14 LAB — CMP14+EGFR
ALT: 22 IU/L (ref 0–32)
AST: 22 IU/L (ref 0–40)
Albumin/Globulin Ratio: 1.6 (ref 1.2–2.2)
Albumin: 4.1 g/dL (ref 3.7–4.7)
Alkaline Phosphatase: 69 IU/L (ref 39–117)
BUN/Creatinine Ratio: 20 (ref 12–28)
BUN: 18 mg/dL (ref 8–27)
Bilirubin Total: 0.4 mg/dL (ref 0.0–1.2)
CO2: 26 mmol/L (ref 20–29)
Calcium: 9.1 mg/dL (ref 8.7–10.3)
Chloride: 104 mmol/L (ref 96–106)
Creatinine, Ser: 0.88 mg/dL (ref 0.57–1.00)
GFR calc Af Amer: 75 mL/min/{1.73_m2} (ref >59)
GFR calc non Af Amer: 65 mL/min/{1.73_m2} (ref >59)
Globulin, Total: 2.5 g/dL (ref 1.5–4.5)
Glucose: 81 mg/dL (ref 65–99)
Potassium: 4.3 mmol/L (ref 3.5–5.2)
Sodium: 144 mmol/L (ref 134–144)
Total Protein: 6.6 g/dL (ref 6.0–8.5)

## 2019-01-14 LAB — VITAMIN D 25 HYDROXY (VIT D DEFICIENCY, FRACTURES): Vit D, 25-Hydroxy: 52.5 ng/mL (ref 30.0–100.0)

## 2019-01-14 LAB — TSH: TSH: 0.488 u[IU]/mL (ref 0.450–4.500)

## 2019-01-16 LAB — TOXASSURE SELECT 13 (MW), URINE

## 2019-01-18 DIAGNOSIS — Z1231 Encounter for screening mammogram for malignant neoplasm of breast: Secondary | ICD-10-CM | POA: Diagnosis not present

## 2019-01-22 ENCOUNTER — Other Ambulatory Visit: Payer: Self-pay | Admitting: Family

## 2019-01-22 DIAGNOSIS — M810 Age-related osteoporosis without current pathological fracture: Secondary | ICD-10-CM

## 2019-01-26 ENCOUNTER — Other Ambulatory Visit: Payer: Self-pay | Admitting: Family

## 2019-01-26 DIAGNOSIS — I10 Essential (primary) hypertension: Secondary | ICD-10-CM

## 2019-01-31 ENCOUNTER — Telehealth: Payer: Self-pay | Admitting: Family

## 2019-01-31 DIAGNOSIS — R062 Wheezing: Secondary | ICD-10-CM

## 2019-01-31 NOTE — Telephone Encounter (Signed)
Please advise on medication request

## 2019-01-31 NOTE — Telephone Encounter (Signed)
Aware , we do not test here and must go to other locations.

## 2019-02-01 MED ORDER — ALBUTEROL SULFATE HFA 108 (90 BASE) MCG/ACT IN AERS: 2.0000 | INHALATION_SPRAY | Freq: Four times a day (QID) | RESPIRATORY_TRACT | 0 refills | Status: DC | PRN

## 2019-02-01 NOTE — Telephone Encounter (Signed)
Lets wait until her COVID test returns, I have sent in an albuterol inhaler.

## 2019-02-01 NOTE — Telephone Encounter (Signed)
Aware. 

## 2019-02-03 ENCOUNTER — Encounter: Payer: Self-pay | Admitting: Family Medicine

## 2019-02-03 ENCOUNTER — Other Ambulatory Visit: Payer: Self-pay

## 2019-02-03 ENCOUNTER — Ambulatory Visit (INDEPENDENT_AMBULATORY_CARE_PROVIDER_SITE_OTHER): Payer: Medicare Other | Admitting: Family Medicine

## 2019-02-03 ENCOUNTER — Telehealth: Payer: Self-pay | Admitting: *Deleted

## 2019-02-03 DIAGNOSIS — B342 Coronavirus infection, unspecified: Secondary | ICD-10-CM

## 2019-02-03 DIAGNOSIS — J0110 Acute frontal sinusitis, unspecified: Secondary | ICD-10-CM | POA: Diagnosis not present

## 2019-02-03 MED ORDER — BENZONATATE 200 MG PO CAPS: 200.0000 mg | ORAL_CAPSULE | Freq: Three times a day (TID) | ORAL | 1 refills | Status: DC | PRN

## 2019-02-03 NOTE — Addendum Note (Signed)
Addended by: [name] A on: 02/03/2019 03:12 PM   Modules accepted: Orders

## 2019-02-03 NOTE — Telephone Encounter (Signed)
Prescription sent to pharmacy.

## 2019-02-03 NOTE — Progress Notes (Signed)
Virtual Visit via telephone Note  I connected with Abigail Moss on 02/03/19 at 1733 by telephone and verified that I am speaking with the correct person using two identifiers. Abigail SprangRosa S Stapleton is currently located at home and no other people are currently with her during visit. The provider, Elige RadonJoshua A Nicholi Ghuman, MD is located in their office at time of visit.  Call ended at 1741  I discussed the limitations, risks, security and privacy concerns of performing an evaluation and management service by telephone and the availability of in person appointments. I also discussed with the patient that there may be a patient responsible charge related to this service. The patient expressed understanding and agreed to proceed.   History and Present Illness: Patient is calling in with a diagnosis of sinus infection and tested positive for covid.  Yesterday she was diagnosed with coronavirus. She is using flonase.  She does feel like its helping. She got sick last week and feels like the cough and congestion is getting worse but the sinus pressure is now is developing.  She says she is using Flonase and Mucinex.  It was recommended to possibly add some Benadryl.  She denies any fevers or chills and relatively overall she feels like she is doing well but she just mainly having the sinus pressure now.  No diagnosis found.  Outpatient Encounter Medications as of 02/03/2019  Medication Sig  . acetic acid-hydrocortisone (VOSOL-HC) OTIC solution Place 4 drops into both ears 4 (four) times daily.  Marland Kitchen. albuterol (VENTOLIN HFA) 108 (90 Base) MCG/ACT inhaler Inhale 2 puffs into the lungs every 6 (six) hours as needed for wheezing or shortness of breath.  Marland Kitchen. alendronate (FOSAMAX) 70 MG tablet TAKE 1 TABLET BY MOUTH  WEEKLY WITH 8 OZ OF PLAIN  WATER 30 MINUTES BEFORE  FIRST FOOD, DRINK OR MEDS.  STAY UPRIGHT FOR 30 MINS  . ALPRAZolam (XANAX) 0.5 MG tablet Take 1 tablet (0.5 mg total) by mouth at bedtime as needed.  Marland Kitchen. aspirin  EC 81 MG tablet Take 81 mg by mouth daily.  Marland Kitchen. buPROPion (WELLBUTRIN XL) 300 MG 24 hr tablet Take 1 tablet (300 mg total) by mouth daily.  . Calcium Citrate-Vitamin D (CALCIUM CITRATE + PO) Take 600 mg by mouth daily.  . Cholecalciferol (VITAMIN D-3 PO) Take 800 mg by mouth daily.  . fluticasone (FLONASE) 50 MCG/ACT nasal spray Place 2 sprays into both nostrils daily.  Marland Kitchen. levothyroxine (SYNTHROID) 125 MCG tablet Take 1 tablet (125 mcg total) by mouth daily before breakfast.  . losartan (COZAAR) 100 MG tablet TAKE 1 TABLET BY MOUTH  DAILY  . meloxicam (MOBIC) 15 MG tablet Take 1 tablet (15 mg total) by mouth daily.  . montelukast (SINGULAIR) 10 MG tablet TAKE 1 TABLET BY MOUTH EVERYDAY AT BEDTIME  . MULTIPLE VITAMIN PO Take by mouth.  Marland Kitchen. omeprazole (PRILOSEC) 40 MG capsule TAKE 1 CAPSULE BY MOUTH EVERY DAY   No facility-administered encounter medications on file as of 02/03/2019.     Review of Systems  Constitutional: Negative for chills and fever.  HENT: Positive for congestion, postnasal drip, rhinorrhea, sinus pressure, sneezing and sore throat. Negative for ear discharge and ear pain.   Eyes: Negative for pain, redness and visual disturbance.  Respiratory: Positive for cough. Negative for chest tightness, shortness of breath and wheezing.   Cardiovascular: Negative for chest pain and leg swelling.  Genitourinary: Negative for difficulty urinating and dysuria.  Musculoskeletal: Negative for back pain and gait problem.  Skin: Negative for rash.  Neurological: Negative for light-headedness and headaches.  Psychiatric/Behavioral: Negative for agitation and behavioral problems.  All other systems reviewed and are negative.   Observations/Objective: Patient sounds comfortable and in no acute distress  Assessment and Plan: Problem List Items Addressed This Visit    None    Visit Diagnoses    Coronavirus infection    -  Primary   Acute non-recurrent frontal sinusitis           Follow  Up Instructions: Recommended continuing the Mucinex and the Flonase and nasal saline sprays and also recommended for her to add Benadryl to help with the symptoms. If worsens or does not improve then she can call us back    I discussed the assessment and treatment plan with the patient. The patient was provided an opportunity to ask questions and all were answered. The patient agreed with the plan and demonstrated an understanding of the instructions.   The patient was advised to call back or seek an in-person evaluation if the symptoms worsen or if the condition fails to improve as anticipated.  The above assessment and management plan was discussed with the patient. The patient verbalized understanding of and has agreed to the management plan. Patient is aware to call the clinic if symptoms persist or worsen. Patient is aware when to return to the clinic for a follow-up visit. Patient educated on when it is appropriate to go to the emergency department.    I provided 8 minutes of non-face-to-face time during this encounter.    Worthy Rancher, MD

## 2019-02-03 NOTE — Telephone Encounter (Signed)
Patient states she tested positive for COVID-19.  Patient prescribed albuterol and would like an Rx for cough

## 2019-02-10 ENCOUNTER — Encounter: Payer: Self-pay | Admitting: *Deleted

## 2019-02-10 ENCOUNTER — Ambulatory Visit (INDEPENDENT_AMBULATORY_CARE_PROVIDER_SITE_OTHER): Payer: Medicare Other | Admitting: *Deleted

## 2019-02-10 DIAGNOSIS — Z0000 Encounter for general adult medical examination without abnormal findings: Secondary | ICD-10-CM

## 2019-02-10 NOTE — Progress Notes (Addendum)
MEDICARE ANNUAL WELLNESS VISIT  02/10/2019  Telephone Visit Disclaimer This Medicare AWV was conducted by telephone due to national recommendations for restrictions regarding the COVID-19 Pandemic (e.g. social distancing).  I verified, using two identifiers, that I am speaking with Abigail Moss or their authorized healthcare agent. I discussed the limitations, risks, security, and privacy concerns of performing an evaluation and management service by telephone and the potential availability of an in-person appointment in the future. The patient expressed understanding and agreed to proceed.   Subjective:  Abigail Moss is a 73 y.o. female patient of Moss, Theador Hawthorne, FNP who had a Medicare Annual Wellness Visit today via telephone. Abigail Moss is Retired and lives with their spouse. she has 2 children. she reports that she is socially active and does interact with friends/family regularly. she is minimally physically active and enjoys watching tv and sewing. Patient and her husband both tested positive for COVID on 02/01/2019.   Patient Care Team: Sharion Balloon, FNP as PCP - General (Family Medicine) Lafayette Dragon, MD (Inactive) as Consulting Physician (Gastroenterology) Raeanne Gathers, AUD (Audiology) Harlen Labs, MD as Referring Physician (Optometry)  Advanced Directives 02/10/2019 11/21/2014  Does Patient Have a Medical Advance Directive? No No  Would patient like information on creating a medical advance directive? No - Patient declined Yes - Educational materials given    Hospital Utilization Over the Past 12 Months: # of hospitalizations or ER visits: 0 # of surgeries: 0  Review of Systems    Patient reports that her overall health is unchanged compared to last year.  Patient Reported Readings (BP, Pulse, CBG, Weight, etc) none  Review of Systems: History obtained from chart review and the patient Respiratory ROS: positive for - cough  Patient states that she is still has a  cough and still feeling very fatigued.      All other systems negative.  Pain Assessment       Current Medications & Allergies (verified) Allergies as of 02/10/2019      Reactions   Neomycin    REACTION: itching   Sulfur    REACTION: itching   Latex Rash      Medication List       Accurate as of February 10, 2019 10:20 AM. If you have any questions, ask your nurse or doctor.        STOP taking these medications   benzonatate 200 MG capsule Commonly known as: TESSALON Stopped by: WYATT, AMY M, RN     TAKE these medications   acetic acid-hydrocortisone OTIC solution Commonly known as: VOSOL-HC Place 4 drops into both ears 4 (four) times daily.   albuterol 108 (90 Base) MCG/ACT inhaler Commonly known as: VENTOLIN HFA Inhale 2 puffs into the lungs every 6 (six) hours as needed for wheezing or shortness of breath.   alendronate 70 MG tablet Commonly known as: FOSAMAX TAKE 1 TABLET BY MOUTH  WEEKLY WITH 8 OZ OF PLAIN  WATER 30 MINUTES BEFORE  FIRST FOOD, DRINK OR MEDS.  STAY UPRIGHT FOR 30 MINS   ALPRAZolam 0.5 MG tablet Commonly known as: XANAX Take 1 tablet (0.5 mg total) by mouth at bedtime as needed.   aspirin EC 81 MG tablet Take 81 mg by mouth daily.   buPROPion 300 MG 24 hr tablet Commonly known as: WELLBUTRIN XL Take 1 tablet (300 mg total) by mouth daily.   CALCIUM CITRATE + PO Take 600 mg by mouth daily.   fluticasone  50 MCG/ACT nasal spray Commonly known as: FLONASE Place 2 sprays into both nostrils daily.   levothyroxine 125 MCG tablet Commonly known as: SYNTHROID Take 1 tablet (125 mcg total) by mouth daily before breakfast.   losartan 100 MG tablet Commonly known as: COZAAR TAKE 1 TABLET BY MOUTH  DAILY   meloxicam 15 MG tablet Commonly known as: MOBIC Take 1 tablet (15 mg total) by mouth daily.   montelukast 10 MG tablet Commonly known as: SINGULAIR TAKE 1 TABLET BY MOUTH EVERYDAY AT BEDTIME   MULTIPLE VITAMIN PO Take by mouth.    omeprazole 40 MG capsule Commonly known as: PRILOSEC TAKE 1 CAPSULE BY MOUTH EVERY DAY   VITAMIN D-3 PO Take 800 mg by mouth daily.       History (reviewed): Past Medical History:  Diagnosis Date  . GERD (gastroesophageal reflux disease)   . Hypertension   . Osteopenia   . Thyroid disease    Past Surgical History:  Procedure Laterality Date  . EAR TUBE REMOVAL    . ROTATOR CUFF REPAIR Left   . TONSILLECTOMY    . VEIN LIGATION AND STRIPPING     Family History  Problem Relation Age of Onset  . Heart disease Mother [id number]       CHF  . Diabetes Father   . Heart disease Sister        CHF  . Cancer Brother   . Cancer Sister        throat  . Stroke Brother   . COPD Brother    Social History   Socioeconomic History  . Marital status: Married    Spouse name: Not on file  . Number of children: 2  . Years of education: 2812  . Highest education level: High school graduate  Occupational History  . Occupation: retired     Comment: worked at [employer]  Social Needs  . Financial resource strain: Not hard at all  . Food insecurity    Worry: Never true    Inability: Never true  . Transportation needs    Medical: No    Non-medical: No  Tobacco Use  . Smoking status: Former Smoker    Quit date: 07/07/1996    Years since quitting: 22.6  . Smokeless tobacco: Never Used  Substance and Sexual Activity  . Alcohol use: No    Alcohol/week: 0.0 standard drinks  . Drug use: No  . Sexual activity: Not on file  Lifestyle  . Physical activity    Days per week: 3 days    Minutes per session: 30 min  . Stress: Not at all  Relationships  . Social connections    Talks on phone: More than three times a week    Gets together: Twice a week    Attends religious service: 1 to 4 times per year    Active member of club or organization: No    Attends meetings of clubs or organizations: Never    Relationship status: Married  Other Topics Concern  . Not on file  Social History Narrative    Lives at home with husband.      Activities of Daily Living In your present state of health, do you have any difficulty performing the following activities: 02/10/2019  Hearing? N  Comment wears hearing aids  Vision? N  Comment wears glasses  Difficulty concentrating or making decisions? N  Walking or climbing stairs? N  Dressing or bathing? N  Doing errands, shopping? N  Quarry managerreparing Food and  eating ? N  Using the Toilet? N  In the past six months, have you accidently leaked urine? N  Do you have problems with loss of bowel control? N  Managing your Medications? N  Managing your Finances? N  Housekeeping or managing your Housekeeping? N  Some recent data might be hidden    Patient Education/ Literacy    Exercise Current Exercise Habits: Home exercise routine, Type of exercise: walking, Time (Minutes): 30, Frequency (Times/Week): 3, Weekly Exercise (Minutes/Week): 90, Intensity: Mild  Diet Patient reports consuming 2 meals a day and 2 snack(s) a day Patient reports that her primary diet is: Regular Patient reports that she does have regular access to food.   Depression Screen PHQ 2/9 Scores 02/10/2019 01/13/2019 05/06/2018 04/28/2018 04/06/2018 01/19/2018 12/21/2017  PHQ - 2 Score 0 0 0 0 0 0 0  PHQ- 9 Score - - - - - - -     Fall Risk Fall Risk  02/10/2019 01/13/2019 07/15/2018 04/28/2018 04/06/2018  Falls in the past year? 0 0 0 No No  Number falls in past yr: - - - - -  Injury with Fall? - - - - -  Comment - - - - -     Objective:  Abigail Moss seemed alert and oriented and she participated appropriately during our telephone visit.  Blood Pressure Weight BMI  BP Readings from Last 3 Encounters:  01/13/19 (!) 133/93  07/15/18 134/84  05/18/18 116/72   Wt Readings from Last 3 Encounters:  01/13/19 187 lb 9.6 oz (85.1 kg)  07/15/18 187 lb (84.8 kg)  05/18/18 187 lb (84.8 kg)   BMI Readings from Last 1 Encounters:  01/13/19 30.28 kg/m    *Unable to obtain current vital  signs, weight, and BMI due to telephone visit type  Hearing/Vision  . Abigail Moss did not seem to have difficulty with hearing/understanding during the telephone conversation . Reports that she has had a formal eye exam by an eye care professional within the past year . Reports that she has not had a formal hearing evaluation within the past year *Unable to fully assess hearing and vision during telephone visit type  Cognitive Function: 6CIT Screen 02/10/2019  What Year? 0 points  What month? 0 points  What time? 0 points  Count back from 20 0 points  Months in reverse 0 points  Repeat phrase 0 points  Total Score 0   (Normal:0-7, Significant for Dysfunction: >8)  Normal Cognitive Function Screening: Yes   Immunization & Health Maintenance Record Immunization History  Administered Date(s) Administered  . Influenza, High Dose Seasonal PF 05/22/2018  . Influenza,inj,Quad PF,6+ Mos 04/07/2013  . Influenza-Unspecified 03/20/2016, 04/30/2017  . Pneumococcal Conjugate-13 11/21/2014  . Pneumococcal Polysaccharide-23 01/02/2011  . Td 04/06/2006    Health Maintenance  Topic Date Due  . TETANUS/TDAP  04/06/2016  . INFLUENZA VACCINE  02/05/2019  . COLONOSCOPY  10/04/2019  . MAMMOGRAM  01/17/2021  . DEXA SCAN  Completed  . Hepatitis C Screening  Completed  . PNA vac Low Risk Adult  Completed       Assessment  This is a routine wellness examination for Abigail Moss.  Health Maintenance: Due or Overdue Health Maintenance Due  Topic Date Due  . TETANUS/TDAP  04/06/2016  . INFLUENZA VACCINE  02/05/2019    Abigail Moss does not need a referral for Community Assistance: Care Management:   no Social Work:    no Prescription Assistance:  no Nutrition/Diabetes Education:  no  Plan:  Personalized Goals Goals Addressed            This Visit's Progress   . AWV       02/10/2019 AWV Goal: Exercise for General Health   Patient will verbalize understanding of the benefits of  increased physical activity:  Exercising regularly is important. It will improve your overall fitness, flexibility, and endurance.  Regular exercise also will improve your overall health. It can help you control your weight, reduce stress, and improve your bone density.  Over the next year, patient will increase physical activity as tolerated with a goal of at least 150 minutes of moderate physical activity per week.   You can tell that you are exercising at a moderate intensity if your heart starts beating faster and you start breathing faster but can still hold a conversation.  Moderate-intensity exercise ideas include:  Walking 1 mile (1.6 km) in about 15 minutes  Biking  Hiking  Golfing  Dancing  Water aerobics  Patient will verbalize understanding of everyday activities that increase physical activity by providing examples like the following: ? Yard work, such as: ? Pushing a Surveyor, mininglawn mower ? Raking and bagging leaves ? Washing your car ? Pushing a stroller ? Shoveling snow ? Gardening ? Washing windows or floors  Patient will be able to explain general safety guidelines for exercising:   Before you start a new exercise program, talk with your health care provider.  Do not exercise so much that you hurt yourself, feel dizzy, or get very short of breath.  Wear comfortable clothes and wear shoes with good support.  Drink plenty of water while you exercise to prevent dehydration or heat stroke.  Work out until your breathing and your heartbeat get faster.       Personalized Health Maintenance & Screening Recommendations  Influenza vaccine Td vaccine Advanced directives: has NO advanced directive - not interested in additional information Shingrix vaccine needed  Lung Cancer Screening Recommended: no (Low Dose CT Chest recommended if Age 9-80 years, 30 pack-year currently smoking OR have quit w/in past 15 years) Hepatitis C Screening recommended: no HIV  Screening recommended: no  Advanced Directives: Written information was not prepared per patient's request.  Referrals & Orders No orders of the defined types were placed in this encounter.   Follow-up Plan . Follow-up with Junie SpencerHawks, Christy A, FNP as planned . We will check the cost of tdap and shingrix for you at your next visit.     I have personally reviewed and noted the following in the patient's chart:   . Medical and social history . Use of alcohol, tobacco or illicit drugs  . Current medications and supplements . Functional ability and status . Nutritional status . Physical activity . Advanced directives . List of other physicians . Hospitalizations, surgeries, and ER visits in previous 12 months . Vitals . Screenings to include cognitive, depression, and falls . Referrals and appointments  In addition, I have reviewed and discussed with Abigail Moss certain preventive protocols, quality metrics, and best practice recommendations. A written personalized care plan for preventive services as well as general preventive health recommendations is available and can be mailed to the patient at her request.      Abigail Moss, Abigail Moss  02/10/2019  I have reviewed and agree with the above AWV documentation.   Abigail Rodneyhristy [name], FNP

## 2019-02-15 ENCOUNTER — Other Ambulatory Visit: Payer: Self-pay | Admitting: Family

## 2019-02-15 DIAGNOSIS — K219 Gastro-esophageal reflux disease without esophagitis: Secondary | ICD-10-CM

## 2019-02-25 ENCOUNTER — Other Ambulatory Visit: Payer: Self-pay | Admitting: Family

## 2019-02-25 DIAGNOSIS — R062 Wheezing: Secondary | ICD-10-CM

## 2019-03-03 DIAGNOSIS — B351 Tinea unguium: Secondary | ICD-10-CM | POA: Diagnosis not present

## 2019-03-03 DIAGNOSIS — M79676 Pain in unspecified toe(s): Secondary | ICD-10-CM | POA: Diagnosis not present

## 2019-03-16 ENCOUNTER — Telehealth: Payer: Self-pay | Admitting: Family

## 2019-03-17 ENCOUNTER — Encounter: Payer: Self-pay | Admitting: Nurse Practitioner

## 2019-03-17 ENCOUNTER — Ambulatory Visit (INDEPENDENT_AMBULATORY_CARE_PROVIDER_SITE_OTHER): Payer: Medicare Other | Admitting: Nurse Practitioner

## 2019-03-17 DIAGNOSIS — R5383 Other fatigue: Secondary | ICD-10-CM

## 2019-03-17 DIAGNOSIS — E039 Hypothyroidism, unspecified: Secondary | ICD-10-CM

## 2019-03-17 NOTE — Telephone Encounter (Signed)
PT had appt with a Mudlogger and labs placed.

## 2019-03-17 NOTE — Progress Notes (Signed)
Virtual Visit via telephone Note Due to COVID-19 pandemic this visit was conducted virtually. This visit type was conducted due to national recommendations for restrictions regarding the COVID-19 Pandemic (e.g. social distancing, sheltering in place) in an effort to limit this patient's exposure and mitigate transmission in our community. All issues noted in this document were discussed and addressed.  A physical exam was not performed with this format.  I connected with Abigail Moss on 03/17/19 at 8:45 by telephone and verified that I am speaking with the correct person using two identifiers. Abigail Moss is currently located at home and no one  is currently with her during visit. The provider, Mary-Margaret Hassell Done, FNP is located in their office at time of visit.  I discussed the limitations, risks, security and privacy concerns of performing an evaluation and management service by telephone and the availability of in person appointments. I also discussed with the patient that there may be a patient responsible charge related to this service. The patient expressed understanding and agreed to proceed.   History and Present Illness:   Chief Complaint: fatigue   Patient  Is c/o fatigue. She has not felt good since. She was diagnosed with covid at [facility]. Since then she has been fatigue. She wants to have her hgb checked and her thyroid to make sure nothing has changed since covid diagnosis. Lab Results  Component Value Date   HGB 12.4 01/13/2019   Lab Results  Component Value Date   TSH 0.488 01/13/2019       Review of Systems  Constitutional: Negative for diaphoresis and weight loss.       Fatigue   HENT: Negative.   Eyes: Negative for blurred vision, double vision and pain.  Respiratory: Negative.  Negative for shortness of breath.   Cardiovascular: Negative.  Negative for chest pain, palpitations, orthopnea and leg swelling.  Gastrointestinal: Negative for  abdominal pain.  Genitourinary: Negative.   Skin: Negative.  Negative for rash.  Neurological: Positive for weakness. Negative for dizziness, sensory change, loss of consciousness and headaches.  Endo/Heme/Allergies: Negative for polydipsia. Does not bruise/bleed easily.  Psychiatric/Behavioral: Negative.  Negative for memory loss. The patient does not have insomnia.   All other systems reviewed and are negative.    Observations/Objective: Alert and oriented- answers all questions appropriately No distress    Assessment and Plan: Abigail Moss in today with chief complaint of Fatigue   1. Hypothyroidism, unspecified type  2. Fatigue, unspecified type Labs pedning Orders Placed This Encounter  Procedures  . CBC with Differential/Platelet  . Thyroid Panel With TSH     Follow Up Instructions: With PCP prn    I discussed the assessment and treatment plan with the patient. The patient was provided an opportunity to ask questions and all were answered. The patient agreed with the plan and demonstrated an understanding of the instructions.   The patient was advised to call back or seek an in-person evaluation if the symptoms worsen or if the condition fails to improve as anticipated.  The above assessment and management plan was discussed with the patient. The patient verbalized understanding of and has agreed to the management plan. Patient is aware to call the clinic if symptoms persist or worsen. Patient is aware when to return to the clinic for a follow-up visit. Patient educated on when it is appropriate to go to the emergency department.   Time call ended:  8:56  I provided 11 minutes of non-face-to-face  time during this encounter.    Mary-Margaret Hassell Done, FNP

## 2019-03-18 LAB — CBC WITH DIFFERENTIAL/PLATELET
Basophils Absolute: 0 10*3/uL (ref 0.0–0.2)
Basos: 1 %
EOS (ABSOLUTE): 0.3 10*3/uL (ref 0.0–0.4)
Eos: 6 %
Hematocrit: 37.3 % (ref 34.0–46.6)
Hemoglobin: 12.8 g/dL (ref 11.1–15.9)
Immature Grans (Abs): 0 10*3/uL (ref 0.0–0.1)
Immature Granulocytes: 0 %
Lymphocytes Absolute: 1.3 10*3/uL (ref 0.7–3.1)
Lymphs: 31 %
MCH: 31.8 pg (ref 26.6–33.0)
MCHC: 34.3 g/dL (ref 31.5–35.7)
MCV: 93 fL (ref 79–97)
Monocytes Absolute: 0.3 10*3/uL (ref 0.1–0.9)
Monocytes: 8 %
Neutrophils Absolute: 2.3 10*3/uL (ref 1.4–7.0)
Neutrophils: 54 %
Platelets: 260 10*3/uL (ref 150–450)
RBC: 4.03 x10E6/uL (ref 3.77–5.28)
RDW: 12.9 % (ref 11.7–15.4)
WBC: 4.2 10*3/uL (ref 3.4–10.8)

## 2019-03-18 LAB — THYROID PANEL WITH TSH
Free Thyroxine Index: 1.9 (ref 1.2–4.9)
T3 Uptake Ratio: 25 % (ref 24–39)
T4, Total: 7.7 ug/dL (ref 4.5–12.0)
TSH: 1.72 u[IU]/mL (ref 0.450–4.500)

## 2019-04-05 ENCOUNTER — Other Ambulatory Visit: Payer: Self-pay | Admitting: Family

## 2019-04-05 DIAGNOSIS — I10 Essential (primary) hypertension: Secondary | ICD-10-CM

## 2019-04-05 DIAGNOSIS — M19071 Primary osteoarthritis, right ankle and foot: Secondary | ICD-10-CM

## 2019-04-05 DIAGNOSIS — F411 Generalized anxiety disorder: Secondary | ICD-10-CM

## 2019-05-12 ENCOUNTER — Ambulatory Visit (INDEPENDENT_AMBULATORY_CARE_PROVIDER_SITE_OTHER): Payer: Medicare Other | Admitting: Family

## 2019-05-12 ENCOUNTER — Encounter: Payer: Self-pay | Admitting: Family

## 2019-05-12 DIAGNOSIS — J0101 Acute recurrent maxillary sinusitis: Secondary | ICD-10-CM

## 2019-05-12 MED ORDER — AMOXICILLIN-POT CLAVULANATE 875-125 MG PO TABS: 1.0000 | ORAL_TABLET | Freq: Two times a day (BID) | ORAL | 0 refills | Status: DC

## 2019-05-12 MED ORDER — CETIRIZINE HCL 10 MG PO TABS: 10.0000 mg | ORAL_TABLET | Freq: Every day | ORAL | 11 refills | Status: DC

## 2019-05-12 NOTE — Progress Notes (Signed)
   Virtual Visit via telephone Note Due to COVID-19 pandemic this visit was conducted virtually. This visit type was conducted due to national recommendations for restrictions regarding the COVID-19 Pandemic (e.g. social distancing, sheltering in place) in an effort to limit this patient's exposure and mitigate transmission in our community. All issues noted in this document were discussed and addressed.  A physical exam was not performed with this format.  I connected with Abigail Moss on 05/12/19 at 12:26 pm  by telephone and verified that I am speaking with the correct person using two identifiers. Abigail Moss is currently located at home  and husband is currently with her during visit. The provider, [name], FNP is located in their office at time of visit.  I discussed the limitations, risks, security and privacy concerns of performing an evaluation and management service by telephone and the availability of in person appointments. I also discussed with the patient that there may be a patient responsible charge related to this service. The patient expressed understanding and agreed to proceed.   History and Present Illness:  Sinusitis This is a recurrent problem. The current episode started in the past 7 days. The problem has been gradually worsening since onset. There has been no fever. Her pain is at a severity of 5/10. The pain is mild. Associated symptoms include congestion, coughing, ear pain (right), headaches, sinus pressure, sneezing and a sore throat. Pertinent negatives include no chills. Past treatments include spray decongestants and oral decongestants. The treatment provided mild relief.      Review of Systems  Constitutional: Negative for chills.  HENT: Positive for congestion, ear pain (right), sinus pressure, sneezing and sore throat.   Respiratory: Positive for cough.   Neurological: Positive for headaches.  All other systems reviewed and are negative.     Observations/Objective: No SOB or distress noted   Assessment and Plan: 1. Acute recurrent maxillary sinusitis Continue Singulair and Flonase Will add Zyrtec I will order Augmentin today, but she will hold off on taking for next 3 days unless symptoms worsen or do not improve - Use a cool mist humidifier  -Use saline nose sprays frequently -Force fluids -For fever or aces or pains- take tylenol or ibuprofen. -Call if symptoms worsen or do not improve   - amoxicillin-clavulanate (AUGMENTIN) 875-125 MG tablet; Take 1 tablet by mouth 2 (two) times daily.  Dispense: 14 tablet; Refill: 0 - cetirizine (ZYRTEC) 10 MG tablet; Take 1 tablet (10 mg total) by mouth daily.  Dispense: 30 tablet; Refill: 11     I discussed the assessment and treatment plan with the patient. The patient was provided an opportunity to ask questions and all were answered. The patient agreed with the plan and demonstrated an understanding of the instructions.   The patient was advised to call back or seek an in-person evaluation if the symptoms worsen or if the condition fails to improve as anticipated.  The above assessment and management plan was discussed with the patient. The patient verbalized understanding of and has agreed to the management plan. Patient is aware to call the clinic if symptoms persist or worsen. Patient is aware when to return to the clinic for a follow-up visit. Patient educated on when it is appropriate to go to the emergency department.   Time call ended:  12:35 pm  I provided 9 minutes of non-face-to-face time during this encounter.    [name], FNP

## 2019-05-13 ENCOUNTER — Other Ambulatory Visit: Payer: Self-pay | Admitting: Family

## 2019-05-13 DIAGNOSIS — J302 Other seasonal allergic rhinitis: Secondary | ICD-10-CM

## 2019-07-03 ENCOUNTER — Other Ambulatory Visit: Payer: Self-pay | Admitting: Family

## 2019-07-03 DIAGNOSIS — M810 Age-related osteoporosis without current pathological fracture: Secondary | ICD-10-CM

## 2019-07-07 ENCOUNTER — Other Ambulatory Visit: Payer: Self-pay | Admitting: Family

## 2019-07-07 DIAGNOSIS — K219 Gastro-esophageal reflux disease without esophagitis: Secondary | ICD-10-CM

## 2019-08-01 ENCOUNTER — Other Ambulatory Visit: Payer: Self-pay

## 2019-08-01 ENCOUNTER — Ambulatory Visit (INDEPENDENT_AMBULATORY_CARE_PROVIDER_SITE_OTHER): Payer: Medicare Other | Admitting: Family Medicine

## 2019-08-01 DIAGNOSIS — R6889 Other general symptoms and signs: Secondary | ICD-10-CM | POA: Diagnosis not present

## 2019-08-01 DIAGNOSIS — T50Z95A Adverse effect of other vaccines and biological substances, initial encounter: Secondary | ICD-10-CM

## 2019-08-01 DIAGNOSIS — M791 Myalgia, unspecified site: Secondary | ICD-10-CM | POA: Diagnosis not present

## 2019-08-01 MED ORDER — PREDNISONE 10 MG (21) PO TBPK: ORAL_TABLET | ORAL | 0 refills | Status: DC

## 2019-08-01 NOTE — Progress Notes (Signed)
Telephone visit  Subjective: CC: myalgia PCP: Junie Spencer, FNP [id number] Abigail Moss is a 74 y.o. female calls for telephone consult today. Patient provides verbal consent for consult held via phone.  Due to COVID-19 pandemic this visit was conducted virtually. This visit type was conducted due to national recommendations for restrictions regarding the COVID-19 Pandemic (e.g. social distancing, sheltering in place) in an effort to limit this patient's exposure and mitigate transmission in our community. All issues noted in this document were discussed and addressed.  A physical exam was not performed with this format.   Location of patient: home Location of provider: [facility] Others present for call: none  1. Myalgia  Patient developed flu like symptoms on Saturday after getting the COVID vaccine.  She had fever 100.40F last night.  This has resolved.  She reports myalgia. She has sinus/ gum pain similar to when she had sinusitis.  She reports decreased appetite. No cough, rhinorrhea, nausea, vomiting diarrhea.  Taking PO without difficulty.  She reports mild pain at injection site that is improving from onset.  ROS: Per HPI  Allergies  Allergen Reactions  . Neomycin     REACTION: itching  . Sulfur     REACTION: itching  . Latex Rash   Past Medical History:  Diagnosis Date  . GERD (gastroesophageal reflux disease)   . Hypertension   . Osteopenia   . Thyroid disease     Current Outpatient Medications:  .  acetic acid-hydrocortisone (VOSOL-HC) OTIC solution, Place 4 drops into both ears 4 (four) times daily., Disp: 10 mL, Rfl: 2 .  albuterol (VENTOLIN HFA) 108 (90 Base) MCG/ACT inhaler, TAKE 2 PUFFS BY MOUTH EVERY 6 HOURS AS NEEDED FOR WHEEZE OR SHORTNESS OF BREATH, Disp: 18 g, Rfl: 0 .  alendronate (FOSAMAX) 70 MG tablet, TAKE 1 TABLET BY MOUTH  WEEKLY WITH 8 OUNCE OF  PLAIN WATER 1/2 HOUR BEFORE FIRST FOOD DRINK OR MEDS.  STAY UPRIGHT FOR 1/2 HOUR, Disp: 12 tablet, Rfl: 3 .   ALPRAZolam (XANAX) 0.5 MG tablet, Take 1 tablet (0.5 mg total) by mouth at bedtime as needed., Disp: 30 tablet, Rfl: 3 .  amoxicillin-clavulanate (AUGMENTIN) 875-125 MG tablet, Take 1 tablet by mouth 2 (two) times daily., Disp: 14 tablet, Rfl: 0 .  aspirin EC 81 MG tablet, Take 81 mg by mouth daily., Disp: , Rfl:  .  buPROPion (WELLBUTRIN XL) 300 MG 24 hr tablet, TAKE 1 TABLET BY MOUTH  DAILY, Disp: 90 tablet, Rfl: 3 .  Calcium Citrate-Vitamin D (CALCIUM CITRATE + PO), Take 600 mg by mouth daily., Disp: , Rfl:  .  cetirizine (ZYRTEC) 10 MG tablet, Take 1 tablet (10 mg total) by mouth daily., Disp: 30 tablet, Rfl: 11 .  Cholecalciferol (VITAMIN D-3 PO), Take 800 mg by mouth daily., Disp: , Rfl:  .  fluticasone (FLONASE) 50 MCG/ACT nasal spray, SPRAY 2 SPRAYS INTO EACH NOSTRIL EVERY DAY, Disp: 48 mL, Rfl: 1 .  levothyroxine (SYNTHROID) 125 MCG tablet, TAKE 1 TABLET BY MOUTH  DAILY BEFORE BREAKFAST, Disp: 90 tablet, Rfl: 0 .  losartan (COZAAR) 100 MG tablet, TAKE 1 TABLET BY MOUTH  DAILY, Disp: 90 tablet, Rfl: 3 .  meloxicam (MOBIC) 15 MG tablet, TAKE 1 TABLET BY MOUTH  DAILY, Disp: 90 tablet, Rfl: 3 .  montelukast (SINGULAIR) 10 MG tablet, TAKE 1 TABLET BY MOUTH  DAILY AT BEDTIME, Disp: 90 tablet, Rfl: 3 .  MULTIPLE VITAMIN PO, Take by mouth., Disp: , Rfl:  .  omeprazole (  PRILOSEC) 40 MG capsule, TAKE 1 CAPSULE BY MOUTH  DAILY, Disp: 90 capsule, Rfl: 0  Assessment/ Plan: 74 y.o. female   1. Flu-like symptoms I suspect this is related to recent vaccination.  We discussed that she is likely mounting an immune response that is appropriate.  Because she is having such a significant response that is not getting much better, I am going to have placed her on a prednisone Dosepak to help out with some of the symptoms that she is experiencing.  We did discuss the remote possibility that she may indeed have an underlying illness that she contracted elsewhere that seem to coincide with the vaccination.  If  symptoms do not improve or if they worsen she is to contact our office at which point we need to consider treatment.  She voiced good understanding the plan.  She will follow-up as needed - predniSONE (STERAPRED UNI-PAK 21 TAB) 10 MG (21) TBPK tablet; As directed x 6 days  Dispense: 21 tablet; Refill: 0  2. Myalgia - predniSONE (STERAPRED UNI-PAK 21 TAB) 10 MG (21) TBPK tablet; As directed x 6 days  Dispense: 21 tablet; Refill: 0  3. Adverse effect of vaccine, initial encounter - predniSONE (STERAPRED UNI-PAK 21 TAB) 10 MG (21) TBPK tablet; As directed x 6 days  Dispense: 21 tablet; Refill: 0   Start time: 4:45pm End time: 4:52pm  Total time spent on patient care (including telephone call/ virtual visit): 12 minutes  [place], [place] [phone]

## 2019-08-18 ENCOUNTER — Other Ambulatory Visit: Payer: Self-pay

## 2019-08-19 ENCOUNTER — Ambulatory Visit (INDEPENDENT_AMBULATORY_CARE_PROVIDER_SITE_OTHER): Payer: Medicare Other | Admitting: Family

## 2019-08-19 ENCOUNTER — Other Ambulatory Visit: Payer: Self-pay

## 2019-08-19 ENCOUNTER — Encounter: Payer: Self-pay | Admitting: Family

## 2019-08-19 ENCOUNTER — Ambulatory Visit: Payer: Medicare Other

## 2019-08-19 VITALS — BP 140/91 | HR 73 | Temp 96.6°F | Ht 66.0 in | Wt 185.2 lb

## 2019-08-19 DIAGNOSIS — F1320 Sedative, hypnotic or anxiolytic dependence, uncomplicated: Secondary | ICD-10-CM | POA: Diagnosis not present

## 2019-08-19 DIAGNOSIS — E039 Hypothyroidism, unspecified: Secondary | ICD-10-CM | POA: Diagnosis not present

## 2019-08-19 DIAGNOSIS — F411 Generalized anxiety disorder: Secondary | ICD-10-CM

## 2019-08-19 DIAGNOSIS — K219 Gastro-esophageal reflux disease without esophagitis: Secondary | ICD-10-CM

## 2019-08-19 DIAGNOSIS — M810 Age-related osteoporosis without current pathological fracture: Secondary | ICD-10-CM

## 2019-08-19 DIAGNOSIS — I10 Essential (primary) hypertension: Secondary | ICD-10-CM | POA: Diagnosis not present

## 2019-08-19 DIAGNOSIS — E559 Vitamin D deficiency, unspecified: Secondary | ICD-10-CM

## 2019-08-19 DIAGNOSIS — E669 Obesity, unspecified: Secondary | ICD-10-CM

## 2019-08-19 DIAGNOSIS — Z79899 Other long term (current) drug therapy: Secondary | ICD-10-CM | POA: Diagnosis not present

## 2019-08-19 MED ORDER — ALPRAZOLAM 0.5 MG PO TABS: 0.5000 mg | ORAL_TABLET | Freq: Every evening | ORAL | 3 refills | Status: DC | PRN

## 2019-08-19 NOTE — Patient Instructions (Signed)
Osteoporosis  Osteoporosis is thinning and loss of density in your bones. Osteoporosis makes bones more brittle and fragile and more likely to break (fracture). Over time, osteoporosis can cause your bones to become so weak that they fracture after a minor fall. Bones in the hip, wrist, and spine are most likely to fracture due to osteoporosis. What are the causes? The exact cause of this condition is not known. What increases the risk? You may be at greater risk for osteoporosis if you:  Have a family history of the condition.  Have poor nutrition.  Use steroid medicines, such as prednisone.  Are female.  Are age 54 or older.  Smoke or have a history of smoking.  Are not physically active (are sedentary).  Are white (Caucasian) or of Asian descent.  Have a small body frame.  Take certain medicines, such as antiseizure medicines. What are the signs or symptoms? A fracture might be the first sign of osteoporosis, especially if the fracture results from a fall or injury that usually would not cause a bone to break. Other signs and symptoms include:  Pain in the neck or low back.  Stooped posture.  Loss of height. How is this diagnosed? This condition may be diagnosed based on:  Your medical history.  A physical exam.  A bone mineral density test, also called a DXA or DEXA test (dual-energy X-ray absorptiometry test). This test uses X-rays to measure the amount of minerals in your bones. How is this treated? The goal of treatment is to strengthen your bones and lower your risk for a fracture. Treatment may involve:  Making lifestyle changes, such as: ? Including foods with more calcium and vitamin D in your diet. ? Doing weight-bearing and muscle-strengthening exercises. ? Stopping tobacco use. ? Limiting alcohol intake.  Taking medicine to slow the process of bone loss or to increase bone density.  Taking daily supplements of calcium and vitamin D.  Taking  hormone replacement medicines, such as estrogen for women and testosterone for men.  Monitoring your levels of calcium and vitamin D. Follow these instructions at home:  Activity  Exercise as told by your health care provider. Ask your health care provider what exercises and activities are safe for you. You should do: ? Exercises that make you work against gravity (weight-bearing exercises), such as tai chi, yoga, or walking. ? Exercises to strengthen muscles, such as lifting weights. Lifestyle  Limit alcohol intake to no more than 1 drink a day for nonpregnant women and 2 drinks a day for men. One drink equals 12 oz of beer, 5 oz of wine, or 1 oz of hard liquor.  Do not use any products that contain nicotine or tobacco, such as cigarettes and e-cigarettes. If you need help quitting, ask your health care provider. Preventing falls  Use devices to help you move around (mobility aids) as needed, such as canes, walkers, scooters, or crutches.  Keep rooms well-lit and clutter-free.  Remove tripping hazards from walkways, including cords and throw rugs.  Install grab bars in bathrooms and safety rails on stairs.  Use rubber mats in the bathroom and other areas that are often wet or slippery.  Wear closed-toe shoes that fit well and support your feet. Wear shoes that have rubber soles or low heels.  Review your medicines with your health care provider. Some medicines can cause dizziness or changes in blood pressure, which can increase your risk of falling. General instructions  Include calcium and vitamin D in  your diet. Calcium is important for bone health, and vitamin D helps your body to absorb calcium. Good sources of calcium and vitamin D include: ? Certain fatty fish, such as salmon and tuna. ? Products that have calcium and vitamin D added to them (fortified products), such as fortified cereals. ? Egg yolks. ? Cheese. ? Liver.  Take over-the-counter and prescription medicines  only as told by your health care provider.  Keep all follow-up visits as told by your health care provider. This is important. Contact a health care provider if:  You have never been screened for osteoporosis and you are: ? A woman who is age 55 or older. ? A man who is age 20 or older. Get help right away if:  You fall or injure yourself. Summary  Osteoporosis is thinning and loss of density in your bones. This makes bones more brittle and fragile and more likely to break (fracture),even with minor falls.  The goal of treatment is to strengthen your bones and reduce your risk for a fracture.  Include calcium and vitamin D in your diet. Calcium is important for bone health, and vitamin D helps your body to absorb calcium.  Talk with your health care provider about screening for osteoporosis if you are a woman who is age 48 or older, or a man who is age 15 or older. This information is not intended to replace advice given to you by your health care provider. Make sure you discuss any questions you have with your health care provider. Document Revised: 06/05/2017 Document Reviewed: 04/17/2017 Elsevier Patient Education  2020 [employer].

## 2019-08-19 NOTE — Progress Notes (Signed)
Subjective:    Patient ID: Abigail Moss, female    DOB: Mar 12, 1946, 74 y.o.   MRN: [id number]  Chief Complaint  Patient presents with  . Medical Management of Chronic Issues    Discuss COVID vaccine   PT presents to the office today for chronic follow up. She reports she had her first COVID vaccine three weeks ago. She reports she had chills, fever, muscle pain, and extreme fatigue for three days. She is nervous about taking her next vaccine.  Hypertension This is a chronic problem. The current episode started more than 1 year ago. The problem has been resolved since onset. The problem is controlled. Associated symptoms include anxiety and malaise/fatigue. Pertinent negatives include no headaches, peripheral edema or shortness of breath. Risk factors for coronary artery disease include obesity, sedentary lifestyle, dyslipidemia and diabetes mellitus. The current treatment provides moderate improvement. There is no history of kidney disease, CAD/MI, CVA or heart failure. Identifiable causes of hypertension include a thyroid problem.  Gastroesophageal Reflux She complains of belching and heartburn. This is a chronic problem. The current episode started more than 1 year ago. The problem occurs occasionally. The problem has been waxing and waning. Associated symptoms include fatigue. Risk factors include obesity. She has tried a PPI for the symptoms. The treatment provided moderate relief.  Thyroid Problem Presents for follow-up visit. Symptoms include anxiety, constipation, depressed mood and fatigue. Patient reports no diarrhea or dry skin. The symptoms have been stable. There is no history of heart failure.  Anxiety Presents for follow-up visit. Symptoms include depressed mood, excessive worry, irritability, nervous/anxious behavior and restlessness. Patient reports no shortness of breath. Symptoms occur occasionally. The severity of symptoms is moderate. The quality of sleep is good.     Osteoporosis  Pt given Fosamax weekly. Last Dexa scan was 12/19/16.     Review of Systems  Constitutional: Positive for fatigue, irritability and malaise/fatigue.  Respiratory: Negative for shortness of breath.   Gastrointestinal: Positive for constipation and heartburn. Negative for diarrhea.  Neurological: Negative for headaches.  Psychiatric/Behavioral: The patient is nervous/anxious.   All other systems reviewed and are negative.      Objective:   Physical Exam Vitals reviewed.  Constitutional:      General: She is not in acute distress.    Appearance: She is well-developed.  HENT:     Head: Normocephalic and atraumatic.     Right Ear: Tympanic membrane normal.     Left Ear: Tympanic membrane normal.  Eyes:     Pupils: Pupils are equal, round, and reactive to light.  Neck:     Thyroid: No thyromegaly.  Cardiovascular:     Rate and Rhythm: Normal rate and regular rhythm.     Heart sounds: Normal heart sounds. No murmur.  Pulmonary:     Effort: Pulmonary effort is normal. No respiratory distress.     Breath sounds: Normal breath sounds. No wheezing.  Abdominal:     General: Bowel sounds are normal. There is no distension.     Palpations: Abdomen is soft.     Tenderness: There is no abdominal tenderness.  Musculoskeletal:        General: No tenderness. Normal range of motion.     Cervical back: Normal range of motion and neck supple.  Skin:    General: Skin is warm and dry.  Neurological:     Mental Status: She is alert and oriented to person, place, and time.     Cranial Nerves: No cranial  nerve deficit.     Deep Tendon Reflexes: Reflexes are normal and symmetric.  Psychiatric:        Behavior: Behavior normal.        Thought Content: Thought content normal.        Judgment: Judgment normal.       BP (!) 140/91   Pulse 73   Temp (!) 96.6 F (35.9 C) (Temporal)   Ht '5\' 6"'  (1.676 m)   Wt 185 lb 3.2 oz (84 kg)   SpO2 98%   BMI 29.89 kg/m       Assessment & Plan:  Abigail Moss comes in today with chief complaint of Medical Management of Chronic Issues (Discuss COVID vaccine)   Diagnosis and orders addressed:  1. Essential hypertension - CMP14+EGFR - CBC with Differential/Platelet  2. Gastroesophageal reflux disease, unspecified whether esophagitis present - CMP14+EGFR - CBC with Differential/Platelet  3. Hypothyroidism, unspecified type - CMP14+EGFR - CBC with Differential/Platelet - TSH  4. Benzodiazepine dependence (HCC) - CMP14+EGFR - CBC with Differential/Platelet - ToxASSURE Select 13 (MW), Urine - ALPRAZolam (XANAX) 0.5 MG tablet; Take 1 tablet (0.5 mg total) by mouth at bedtime as needed.  Dispense: 30 tablet; Refill: 3  5. Controlled substance agreement signed - CMP14+EGFR - CBC with Differential/Platelet - ToxASSURE Select 13 (MW), Urine - ALPRAZolam (XANAX) 0.5 MG tablet; Take 1 tablet (0.5 mg total) by mouth at bedtime as needed.  Dispense: 30 tablet; Refill: 3  6. GAD (generalized anxiety disorder)  - CMP14+EGFR - CBC with Differential/Platelet - ALPRAZolam (XANAX) 0.5 MG tablet; Take 1 tablet (0.5 mg total) by mouth at bedtime as needed.  Dispense: 30 tablet; Refill: 3  7. Obesity (BMI 30-39.9) - CMP14+EGFR - CBC with Differential/Platelet  8. Vitamin D deficiency - CMP14+EGFR - CBC with Differential/Platelet  9. Osteoporosis, unspecified osteoporosis type, unspecified pathological fracture presence - DG [facility] DEXA   Labs pending Controlled contract and drug screen up dated today Pt reviewed in [place] controlled database- No red flags noted Health Maintenance reviewed Diet and exercise encouraged  Follow up plan: 6 months    [name], FNP

## 2019-08-20 LAB — CMP14+EGFR
ALT: 20 IU/L (ref 0–32)
AST: 23 IU/L (ref 0–40)
Albumin/Globulin Ratio: 1.6 (ref 1.2–2.2)
Albumin: 4 g/dL (ref 3.7–4.7)
Alkaline Phosphatase: 66 IU/L (ref 39–117)
BUN/Creatinine Ratio: 18 (ref 12–28)
BUN: 15 mg/dL (ref 8–27)
Bilirubin Total: 0.3 mg/dL (ref 0.0–1.2)
CO2: 26 mmol/L (ref 20–29)
Calcium: 9.5 mg/dL (ref 8.7–10.3)
Chloride: 104 mmol/L (ref 96–106)
Creatinine, Ser: 0.85 mg/dL (ref 0.57–1.00)
GFR calc Af Amer: 78 mL/min/{1.73_m2} (ref >59)
GFR calc non Af Amer: 67 mL/min/{1.73_m2} (ref >59)
Globulin, Total: 2.5 g/dL (ref 1.5–4.5)
Glucose: 77 mg/dL (ref 65–99)
Potassium: 4.2 mmol/L (ref 3.5–5.2)
Sodium: 141 mmol/L (ref 134–144)
Total Protein: 6.5 g/dL (ref 6.0–8.5)

## 2019-08-20 LAB — CBC WITH DIFFERENTIAL/PLATELET
Basophils Absolute: 0.1 10*3/uL (ref 0.0–0.2)
Basos: 1 %
EOS (ABSOLUTE): 0.2 10*3/uL (ref 0.0–0.4)
Eos: 4 %
Hematocrit: 36.8 % (ref 34.0–46.6)
Hemoglobin: 12.7 g/dL (ref 11.1–15.9)
Immature Grans (Abs): 0 10*3/uL (ref 0.0–0.1)
Immature Granulocytes: 0 %
Lymphocytes Absolute: 1.2 10*3/uL (ref 0.7–3.1)
Lymphs: 27 %
MCH: 32.6 pg (ref 26.6–33.0)
MCHC: 34.5 g/dL (ref 31.5–35.7)
MCV: 95 fL (ref 79–97)
Monocytes Absolute: 0.4 10*3/uL (ref 0.1–0.9)
Monocytes: 9 %
Neutrophils Absolute: 2.7 10*3/uL (ref 1.4–7.0)
Neutrophils: 59 %
Platelets: 254 10*3/uL (ref 150–450)
RBC: 3.89 x10E6/uL (ref 3.77–5.28)
RDW: 12.8 % (ref 11.7–15.4)
WBC: 4.6 10*3/uL (ref 3.4–10.8)

## 2019-08-20 LAB — TSH: TSH: 2.71 u[IU]/mL (ref 0.450–4.500)

## 2019-08-23 LAB — TOXASSURE SELECT 13 (MW), URINE

## 2019-09-21 ENCOUNTER — Encounter: Payer: Self-pay | Admitting: Family Medicine

## 2019-09-21 ENCOUNTER — Ambulatory Visit (INDEPENDENT_AMBULATORY_CARE_PROVIDER_SITE_OTHER): Payer: Medicare Other | Admitting: Family Medicine

## 2019-09-21 DIAGNOSIS — M5431 Sciatica, right side: Secondary | ICD-10-CM | POA: Diagnosis not present

## 2019-09-21 MED ORDER — PREDNISONE 20 MG PO TABS: ORAL_TABLET | ORAL | 0 refills | Status: DC

## 2019-09-21 NOTE — Progress Notes (Signed)
Virtual Visit via telephone Note Due to COVID-19 pandemic this visit was conducted virtually. This visit type was conducted due to national recommendations for restrictions regarding the COVID-19 Pandemic (e.g. social distancing, sheltering in place) in an effort to limit this patient's exposure and mitigate transmission in our community. All issues noted in this document were discussed and addressed.  A physical exam was not performed with this format.   I connected with Abigail Moss on 09/21/2019 at 1145 by telephone and verified that I am speaking with the correct person using two identifiers. Abigail Moss is currently located at home and family is currently with them during visit. The provider, Kari Baars, FNP is located in their office at time of visit.  I discussed the limitations, risks, security and privacy concerns of performing an evaluation and management service by telephone and the availability of in person appointments. I also discussed with the patient that there may be a patient responsible charge related to this service. The patient expressed understanding and agreed to proceed.  Subjective:  Patient ID: Abigail Moss, female    DOB: April 26, 1946, 74 y.o.   MRN: [id number]  Chief Complaint:  Leg Pain (right)   HPI: Abigail Moss is a 74 y.o. female presenting on 09/21/2019 for Leg Pain (right)   Pt reports right hip pain that radiates down her right leg. States she has been doing more housework over the last few days and this seemed to cause the symptoms. She denies specific injury.   Leg Pain  The incident occurred 2 days ago. There was no injury mechanism. The pain is present in the right hip, right thigh and right knee. The quality of the pain is described as aching and shooting. The pain is at a severity of 4/10. The pain is mild. The pain has been constant since onset. Associated symptoms include tingling. Pertinent negatives include no inability to bear weight, loss of  motion, loss of sensation, muscle weakness or numbness. She reports no foreign bodies present. The symptoms are aggravated by movement and weight bearing. She has tried acetaminophen for the symptoms. The treatment provided no relief.     Relevant past medical, surgical, family, and social history reviewed and updated as indicated.  Allergies and medications reviewed and updated.   Past Medical History:  Diagnosis Date  . GERD (gastroesophageal reflux disease)   . Hypertension   . Osteopenia   . Thyroid disease     Past Surgical History:  Procedure Laterality Date  . EAR TUBE REMOVAL    . ROTATOR CUFF REPAIR Left   . TONSILLECTOMY    . VEIN LIGATION AND STRIPPING      Social History   Socioeconomic History  . Marital status: Married    Spouse name: Not on file  . Number of children: 2  . Years of education: 68  . Highest education level: High school graduate  Occupational History  . Occupation: retired     Comment: worked at [employer]  . Smoking status: Former Smoker    Quit date: 07/07/1996    Years since quitting: 23.2  . Smokeless tobacco: Never Used  Substance and Sexual Activity  . Alcohol use: No    Alcohol/week: 0.0 standard drinks  . Drug use: No  . Sexual activity: Not on file  Other Topics Concern  . Not on file  Social History Narrative   Lives at home with husband.     Social Determinants of Health  Financial Resource Strain: Low Risk   . Difficulty of Paying Living Expenses: Not hard at all  Food Insecurity: No Food Insecurity  . Worried About Programme researcher, broadcasting/film/video in the Last Year: Never true  . Ran Out of Food in the Last Year: Never true  Transportation Needs: No Transportation Needs  . Lack of Transportation (Medical): No  . Lack of Transportation (Non-Medical): No  Physical Activity: Insufficiently Active  . Days of Exercise per Week: 3 days  . Minutes of Exercise per Session: 30 min  Stress: No Stress Concern Present  . Feeling  of Stress : Not at all  Social Connections: Slightly Isolated  . Frequency of Communication with Friends and Family: More than three times a week  . Frequency of Social Gatherings with Friends and Family: Twice a week  . Attends Religious Services: 1 to 4 times per year  . Active Member of Clubs or Organizations: No  . Attends Banker Meetings: Never  . Marital Status: Married  Catering manager Violence: Not At Risk  . Fear of Current or Ex-Partner: No  . Emotionally Abused: No  . Physically Abused: No  . Sexually Abused: No    Outpatient Encounter Medications as of 09/21/2019  Medication Sig  . acetic acid-hydrocortisone (VOSOL-HC) OTIC solution Place 4 drops into both ears 4 (four) times daily.  Marland Kitchen albuterol (VENTOLIN HFA) 108 (90 Base) MCG/ACT inhaler TAKE 2 PUFFS BY MOUTH EVERY 6 HOURS AS NEEDED FOR WHEEZE OR SHORTNESS OF BREATH  . alendronate (FOSAMAX) 70 MG tablet TAKE 1 TABLET BY MOUTH  WEEKLY WITH 8 OUNCE OF  PLAIN WATER 1/2 HOUR BEFORE FIRST FOOD DRINK OR MEDS.  STAY UPRIGHT FOR 1/2 HOUR  . ALPRAZolam (XANAX) 0.5 MG tablet Take 1 tablet (0.5 mg total) by mouth at bedtime as needed.  Marland Kitchen aspirin EC 81 MG tablet Take 81 mg by mouth daily.  Marland Kitchen buPROPion (WELLBUTRIN XL) 300 MG 24 hr tablet TAKE 1 TABLET BY MOUTH  DAILY  . Calcium Citrate-Vitamin D (CALCIUM CITRATE + PO) Take 600 mg by mouth daily.  . cetirizine (ZYRTEC) 10 MG tablet Take 1 tablet (10 mg total) by mouth daily.  . Cholecalciferol (VITAMIN D-3 PO) Take 800 mg by mouth daily.  . fluticasone (FLONASE) 50 MCG/ACT nasal spray SPRAY 2 SPRAYS INTO EACH NOSTRIL EVERY DAY  . levothyroxine (SYNTHROID) 125 MCG tablet TAKE 1 TABLET BY MOUTH  DAILY BEFORE BREAKFAST  . losartan (COZAAR) 100 MG tablet TAKE 1 TABLET BY MOUTH  DAILY  . meloxicam (MOBIC) 15 MG tablet TAKE 1 TABLET BY MOUTH  DAILY  . montelukast (SINGULAIR) 10 MG tablet TAKE 1 TABLET BY MOUTH  DAILY AT BEDTIME  . MULTIPLE VITAMIN PO Take by mouth.  Marland Kitchen  omeprazole (PRILOSEC) 40 MG capsule TAKE 1 CAPSULE BY MOUTH  DAILY  . predniSONE (DELTASONE) 20 MG tablet 2 po at sametime daily for 5 days   No facility-administered encounter medications on file as of 09/21/2019.    Allergies  Allergen Reactions  . Neomycin     REACTION: itching  . Sulfur     REACTION: itching  . Latex Rash    Review of Systems  Constitutional: Negative for activity change, appetite change, chills, diaphoresis, fatigue, fever and unexpected weight change.  HENT: Negative.   Eyes: Negative.  Negative for photophobia and visual disturbance.  Respiratory: Negative for cough, chest tightness and shortness of breath.   Cardiovascular: Negative for chest pain, palpitations and leg swelling.  Gastrointestinal:  Negative for abdominal distention, abdominal pain, anal bleeding, blood in stool, constipation, diarrhea, nausea, rectal pain and vomiting.  Endocrine: Negative.  Negative for cold intolerance, heat intolerance, polydipsia, polyphagia and polyuria.  Genitourinary: Negative for decreased urine volume, difficulty urinating, dysuria, frequency and urgency.  Musculoskeletal: Positive for arthralgias, gait problem and myalgias. Negative for back pain, joint swelling, neck pain and neck stiffness.  Skin: Negative.   Allergic/Immunologic: Negative.   Neurological: Positive for tingling. Negative for dizziness, tremors, seizures, syncope, facial asymmetry, speech difficulty, weakness, light-headedness, numbness and headaches.  Hematological: Negative.   Psychiatric/Behavioral: Negative for confusion, hallucinations, sleep disturbance and suicidal ideas.  All other systems reviewed and are negative.        Observations/Objective: No vital signs or physical exam, this was a telephone or virtual health encounter.  Pt alert and oriented, answers all questions appropriately, and able to speak in full sentences.    Assessment and Plan: Erisa was seen today for leg  pain.  Diagnoses and all orders for this visit:  Sciatica, right side Reported symptoms consistent with right sciatica. No red flags concerning for cauda equina syndrome or malignancy. Will treat with below. Symptomatic care discussed in detail. Report any new, worsening, or persistent symptoms.  -     predniSONE (DELTASONE) 20 MG tablet; 2 po at sametime daily for 5 days     Follow Up Instructions: Return if symptoms worsen or fail to improve.    I discussed the assessment and treatment plan with the patient. The patient was provided an opportunity to ask questions and all were answered. The patient agreed with the plan and demonstrated an understanding of the instructions.   The patient was advised to call back or seek an in-person evaluation if the symptoms worsen or if the condition fails to improve as anticipated.  The above assessment and management plan was discussed with the patient. The patient verbalized understanding of and has agreed to the management plan. Patient is aware to call the clinic if they develop any new symptoms or if symptoms persist or worsen. Patient is aware when to return to the clinic for a follow-up visit. Patient educated on when it is appropriate to go to the emergency department.    I provided 15 minutes of non-face-to-face time during this encounter. The call started at 1145. The call ended at 1200. The other time was used for coordination of care.    Monia Pouch, FNP-C [facility] [address] [place], [place] [address] [phone] 09/21/2019

## 2019-09-27 ENCOUNTER — Telehealth: Payer: Self-pay | Admitting: Family

## 2019-09-28 ENCOUNTER — Encounter: Payer: Self-pay | Admitting: Family Medicine

## 2019-09-28 ENCOUNTER — Ambulatory Visit (INDEPENDENT_AMBULATORY_CARE_PROVIDER_SITE_OTHER): Payer: Medicare Other | Admitting: Family Medicine

## 2019-09-28 DIAGNOSIS — R350 Frequency of micturition: Secondary | ICD-10-CM | POA: Diagnosis not present

## 2019-09-28 DIAGNOSIS — R300 Dysuria: Secondary | ICD-10-CM | POA: Diagnosis not present

## 2019-09-28 DIAGNOSIS — R3989 Other symptoms and signs involving the genitourinary system: Secondary | ICD-10-CM | POA: Diagnosis not present

## 2019-09-28 MED ORDER — AMOXICILLIN-POT CLAVULANATE 875-125 MG PO TABS: 1.0000 | ORAL_TABLET | Freq: Two times a day (BID) | ORAL | 0 refills | Status: AC

## 2019-09-28 NOTE — Telephone Encounter (Signed)
Night clinic appt made

## 2019-09-28 NOTE — Progress Notes (Signed)
Virtual Visit via telephone Note Due to COVID-19 pandemic this visit was conducted virtually. This visit type was conducted due to national recommendations for restrictions regarding the COVID-19 Pandemic (e.g. social distancing, sheltering in place) in an effort to limit this patient's exposure and mitigate transmission in our community. All issues noted in this document were discussed and addressed.  A physical exam was not performed with this format.   I connected with Abigail Moss on 09/28/2019 at 2135 by telephone and verified that I am speaking with the correct person using two identifiers. Abigail Moss is currently located at home and family is currently with them during visit. The provider, Kari Baars, FNP is located in their office at time of visit.  I discussed the limitations, risks, security and privacy concerns of performing an evaluation and management service by telephone and the availability of in person appointments. I also discussed with the patient that there may be a patient responsible charge related to this service. The patient expressed understanding and agreed to proceed.  Subjective:  Patient ID: Abigail Moss, female    DOB: 1945-07-26, 74 y.o.   MRN: [id number]  Chief Complaint:  Urinary Tract Infection   HPI: Abigail Moss is a 74 y.o. female presenting on 09/28/2019 for Urinary Tract Infection   Urinary Tract Infection  This is a new problem. The current episode started in the past 7 days. The problem occurs every urination. The problem has been gradually worsening. The quality of the pain is described as aching and burning. The pain is at a severity of 3/10. The pain is mild. There has been no fever. She is not sexually active. There is no history of pyelonephritis. Associated symptoms include frequency, hesitancy and urgency. Pertinent negatives include no chills, discharge, flank pain, hematuria, nausea, possible pregnancy, sweats or vomiting. She has tried  increased fluids for the symptoms. The treatment provided no relief.     Relevant past medical, surgical, family, and social history reviewed and updated as indicated.  Allergies and medications reviewed and updated.   Past Medical History:  Diagnosis Date  . GERD (gastroesophageal reflux disease)   . Hypertension   . Osteopenia   . Thyroid disease     Past Surgical History:  Procedure Laterality Date  . EAR TUBE REMOVAL    . ROTATOR CUFF REPAIR Left   . TONSILLECTOMY    . VEIN LIGATION AND STRIPPING      Social History   Socioeconomic History  . Marital status: Married    Spouse name: Not on file  . Number of children: 2  . Years of education: 42  . Highest education level: High school graduate  Occupational History  . Occupation: retired     Comment: worked at [employer]  . Smoking status: Former Smoker    Quit date: 07/07/1996    Years since quitting: 23.2  . Smokeless tobacco: Never Used  Substance and Sexual Activity  . Alcohol use: No    Alcohol/week: 0.0 standard drinks  . Drug use: No  . Sexual activity: Not on file  Other Topics Concern  . Not on file  Social History Narrative   Lives at home with husband.     Social Determinants of Health   Financial Resource Strain: Low Risk   . Difficulty of Paying Living Expenses: Not hard at all  Food Insecurity: No Food Insecurity  . Worried About Programme researcher, broadcasting/film/video in the Last Year: Never true  .  Ran Out of Food in the Last Year: Never true  Transportation Needs: No Transportation Needs  . Lack of Transportation (Medical): No  . Lack of Transportation (Non-Medical): No  Physical Activity: Insufficiently Active  . Days of Exercise per Week: 3 days  . Minutes of Exercise per Session: 30 min  Stress: No Stress Concern Present  . Feeling of Stress : Not at all  Social Connections: Slightly Isolated  . Frequency of Communication with Friends and Family: More than three times a week  . Frequency of  Social Gatherings with Friends and Family: Twice a week  . Attends Religious Services: 1 to 4 times per year  . Active Member of Clubs or Organizations: No  . Attends Banker Meetings: Never  . Marital Status: Married  Catering manager Violence: Not At Risk  . Fear of Current or Ex-Partner: No  . Emotionally Abused: No  . Physically Abused: No  . Sexually Abused: No    Outpatient Encounter Medications as of 09/28/2019  Medication Sig  . acetic acid-hydrocortisone (VOSOL-HC) OTIC solution Place 4 drops into both ears 4 (four) times daily.  Marland Kitchen albuterol (VENTOLIN HFA) 108 (90 Base) MCG/ACT inhaler TAKE 2 PUFFS BY MOUTH EVERY 6 HOURS AS NEEDED FOR WHEEZE OR SHORTNESS OF BREATH  . alendronate (FOSAMAX) 70 MG tablet TAKE 1 TABLET BY MOUTH  WEEKLY WITH 8 OUNCE OF  PLAIN WATER 1/2 HOUR BEFORE FIRST FOOD DRINK OR MEDS.  STAY UPRIGHT FOR 1/2 HOUR  . ALPRAZolam (XANAX) 0.5 MG tablet Take 1 tablet (0.5 mg total) by mouth at bedtime as needed.  Marland Kitchen amoxicillin-clavulanate (AUGMENTIN) 875-125 MG tablet Take 1 tablet by mouth 2 (two) times daily for 10 days.  Marland Kitchen aspirin EC 81 MG tablet Take 81 mg by mouth daily.  Marland Kitchen buPROPion (WELLBUTRIN XL) 300 MG 24 hr tablet TAKE 1 TABLET BY MOUTH  DAILY  . Calcium Citrate-Vitamin D (CALCIUM CITRATE + PO) Take 600 mg by mouth daily.  . cetirizine (ZYRTEC) 10 MG tablet Take 1 tablet (10 mg total) by mouth daily.  . Cholecalciferol (VITAMIN D-3 PO) Take 800 mg by mouth daily.  . fluticasone (FLONASE) 50 MCG/ACT nasal spray SPRAY 2 SPRAYS INTO EACH NOSTRIL EVERY DAY  . levothyroxine (SYNTHROID) 125 MCG tablet TAKE 1 TABLET BY MOUTH  DAILY BEFORE BREAKFAST  . losartan (COZAAR) 100 MG tablet TAKE 1 TABLET BY MOUTH  DAILY  . meloxicam (MOBIC) 15 MG tablet TAKE 1 TABLET BY MOUTH  DAILY  . montelukast (SINGULAIR) 10 MG tablet TAKE 1 TABLET BY MOUTH  DAILY AT BEDTIME  . MULTIPLE VITAMIN PO Take by mouth.  Marland Kitchen omeprazole (PRILOSEC) 40 MG capsule TAKE 1 CAPSULE BY MOUTH   DAILY  . predniSONE (DELTASONE) 20 MG tablet 2 po at sametime daily for 5 days   No facility-administered encounter medications on file as of 09/28/2019.    Allergies  Allergen Reactions  . Neomycin     REACTION: itching  . Sulfur     REACTION: itching  . Latex Rash    Review of Systems  Constitutional: Negative for activity change, appetite change, chills, diaphoresis, fatigue, fever and unexpected weight change.  HENT: Negative.   Eyes: Negative.   Respiratory: Negative for cough, chest tightness and shortness of breath.   Cardiovascular: Negative for chest pain, palpitations and leg swelling.  Gastrointestinal: Negative for abdominal pain, blood in stool, constipation, diarrhea, nausea and vomiting.  Endocrine: Negative.   Genitourinary: Positive for dysuria, frequency, hesitancy and urgency. Negative  for decreased urine volume, difficulty urinating, flank pain, hematuria, vaginal bleeding, vaginal discharge and vaginal pain.  Musculoskeletal: Negative for arthralgias and myalgias.  Skin: Negative.   Allergic/Immunologic: Negative.   Neurological: Negative for dizziness, weakness and headaches.  Hematological: Negative.   Psychiatric/Behavioral: Negative for confusion, hallucinations, sleep disturbance and suicidal ideas.  All other systems reviewed and are negative.        Observations/Objective: No vital signs or physical exam, this was a telephone or virtual health encounter.  Pt alert and oriented, answers all questions appropriately, and able to speak in full sentences.    Assessment and Plan: Abigail Moss was seen today for urinary tract infection.  Diagnoses and all orders for this visit:  Dysuria Frequency of micturition Suspected UTI Reported symptoms consistent with acute cystitis. No red flags concerning for acute pyelonephritis. Previous urine cultures reviewed and treatment selection based on those results. Pt aware to report any new, worsening, or persistent  symptoms. Medications as prescribed.  -     amoxicillin-clavulanate (AUGMENTIN) 875-125 MG tablet; Take 1 tablet by mouth 2 (two) times daily for 10 days.     Follow Up Instructions: Return in about 2 weeks (around 10/12/2019), or if symptoms worsen or fail to improve, for reevaluation of UTI symptoms.    I discussed the assessment and treatment plan with the patient. The patient was provided an opportunity to ask questions and all were answered. The patient agreed with the plan and demonstrated an understanding of the instructions.   The patient was advised to call back or seek an in-person evaluation if the symptoms worsen or if the condition fails to improve as anticipated.  The above assessment and management plan was discussed with the patient. The patient verbalized understanding of and has agreed to the management plan. Patient is aware to call the clinic if they develop any new symptoms or if symptoms persist or worsen. Patient is aware when to return to the clinic for a follow-up visit. Patient educated on when it is appropriate to go to the emergency department.    I provided 15 minutes of non-face-to-face time during this encounter. The call started at 1235. The call ended at 1250. The other time was used for coordination of care.    Monia Pouch, FNP-C [facility] [address] [place], [place] [address] [phone] 09/28/2019

## 2019-10-09 ENCOUNTER — Other Ambulatory Visit: Payer: Self-pay | Admitting: Family

## 2019-10-09 DIAGNOSIS — K219 Gastro-esophageal reflux disease without esophagitis: Secondary | ICD-10-CM

## 2019-11-01 ENCOUNTER — Encounter: Payer: Self-pay | Admitting: Nurse Practitioner

## 2019-11-01 ENCOUNTER — Ambulatory Visit (INDEPENDENT_AMBULATORY_CARE_PROVIDER_SITE_OTHER): Payer: Medicare Other | Admitting: Nurse Practitioner

## 2019-11-01 DIAGNOSIS — R05 Cough: Secondary | ICD-10-CM | POA: Diagnosis not present

## 2019-11-01 DIAGNOSIS — R059 Cough, unspecified: Secondary | ICD-10-CM

## 2019-11-01 DIAGNOSIS — J0100 Acute maxillary sinusitis, unspecified: Secondary | ICD-10-CM | POA: Diagnosis not present

## 2019-11-01 MED ORDER — AMOXICILLIN 875 MG PO TABS: 875.0000 mg | ORAL_TABLET | Freq: Two times a day (BID) | ORAL | 0 refills | Status: DC

## 2019-11-01 NOTE — Progress Notes (Signed)
Virtual Visit via telephone Note Due to COVID-19 pandemic this visit was conducted virtually. This visit type was conducted due to national recommendations for restrictions regarding the COVID-19 Pandemic (e.g. social distancing, sheltering in place) in an effort to limit this patient's exposure and mitigate transmission in our community. All issues noted in this document were discussed and addressed.  A physical exam was not performed with this format.  I connected with Abigail Moss on 11/01/19 at 10:30 by telephone and verified that I am speaking with the correct person using two identifiers. Abigail Moss is currently located at home and no one is currently with her during visit. The provider, Mary-Margaret Hassell Done, FNP is located in their office at time of visit.  I discussed the limitations, risks, security and privacy concerns of performing an evaluation and management service by telephone and the availability of in person appointments. I also discussed with the patient that there may be a patient responsible charge related to this service. The patient expressed understanding and agreed to proceed.   History and Present Illness:   Chief Complaint: Nasal Congestion   HPI Patient started feeling bad with congestion on Friday. Has gradually gotten worse. Now it has moved down into her chest. Has a productive cough. Has been taking mucinex whih helps soe.  Review of Systems  Constitutional: Negative for chills, diaphoresis, fever and weight loss.  HENT: Positive for congestion, ear pain, sinus pain and sore throat.   Eyes: Negative for blurred vision, double vision and pain.  Respiratory: Positive for cough. Negative for shortness of breath.   Cardiovascular: Negative for chest pain, palpitations, orthopnea and leg swelling.  Gastrointestinal: Negative for abdominal pain.  Skin: Negative for rash.  Neurological: Positive for headaches. Negative for dizziness, sensory change, loss of  consciousness and weakness.  Endo/Heme/Allergies: Negative for polydipsia. Does not bruise/bleed easily.  Psychiatric/Behavioral: Negative for memory loss. The patient does not have insomnia.   All other systems reviewed and are negative.    Observations/Objective: Alert and oriented- answers all questions appropriately No distress Voice hoarse Dry cough   Assessment and Plan: Abigail Moss in today with chief complaint of Nasal Congestion   1. Acute non-recurrent maxillary sinusitis  2. Cough 1. Take meds as prescribed 2. Use a cool mist humidifier especially during the winter months and when heat has been humid. 3. Use saline nose sprays frequently 4. Saline irrigations of the nose can be very helpful if done frequently.  * 4X daily for 1 week*  * Use of a nettie pot can be helpful with this. Follow directions with this* 5. Drink plenty of fluids 6. Keep thermostat turn down low 7.For any cough or congestion  Use plain Mucinex- regular strength or max strength is fine   * Children- consult with Pharmacist for dosing 8. For fever or aces or pains- take tylenol or ibuprofen appropriate for age and weight.  * for fevers greater than 101 orally you may alternate ibuprofen and tylenol every  3 hours.   Meds ordered this encounter  Medications  . amoxicillin (AMOXIL) 875 MG tablet    Sig: Take 1 tablet (875 mg total) by mouth 2 (two) times daily. 1 po BID    Dispense:  20 tablet    Refill:  0    Order Specific Question:   Supervising Provider    Answer:   Caryl Pina A [[id number]]        Follow Up Instructions: prn    I  discussed the assessment and treatment plan with the patient. The patient was provided an opportunity to ask questions and all were answered. The patient agreed with the plan and demonstrated an understanding of the instructions.   The patient was advised to call back or seek an in-person evaluation if the symptoms worsen or if the condition fails  to improve as anticipated.  The above assessment and management plan was discussed with the patient. The patient verbalized understanding of and has agreed to the management plan. Patient is aware to call the clinic if symptoms persist or worsen. Patient is aware when to return to the clinic for a follow-up visit. Patient educated on when it is appropriate to go to the emergency department.   Time call ended:  10:43  I provided 13 minutes of non-face-to-face time during this encounter.    Mary-Margaret Daphine Deutscher, FNP

## 2019-11-15 ENCOUNTER — Other Ambulatory Visit: Payer: Self-pay | Admitting: Family

## 2019-11-15 ENCOUNTER — Encounter: Payer: Self-pay | Admitting: Family

## 2019-11-15 ENCOUNTER — Ambulatory Visit (INDEPENDENT_AMBULATORY_CARE_PROVIDER_SITE_OTHER): Payer: Medicare Other | Admitting: Family

## 2019-11-15 DIAGNOSIS — J209 Acute bronchitis, unspecified: Secondary | ICD-10-CM

## 2019-11-15 MED ORDER — PREDNISONE 10 MG (21) PO TBPK: ORAL_TABLET | ORAL | 0 refills | Status: DC

## 2019-11-15 NOTE — Progress Notes (Signed)
Virtual Visit via telephone Note Due to COVID-19 pandemic this visit was conducted virtually. This visit type was conducted due to national recommendations for restrictions regarding the COVID-19 Pandemic (e.g. social distancing, sheltering in place) in an effort to limit this patient's exposure and mitigate transmission in our community. All issues noted in this document were discussed and addressed.  A physical exam was not performed with this format.  I connected with Abigail Moss on 11/15/19 at 4:10 pm  by telephone and verified that I am speaking with the correct person using two identifiers. Abigail Moss is currently located at home and no one is currently with her during visit. The provider, Jannifer Rodney, FNP is located in their office at time of visit.  I discussed the limitations, risks, security and privacy concerns of performing an evaluation and management service by telephone and the availability of in person appointments. I also discussed with the patient that there may be a patient responsible charge related to this service. The patient expressed understanding and agreed to proceed.   History and Present Illness:  Cough This is a recurrent problem. The current episode started 1 to 4 weeks ago. The problem has been unchanged. The problem occurs every few minutes. The cough is non-productive. Associated symptoms include myalgias, nasal congestion, postnasal drip and shortness of breath ("maybe a little"). Pertinent negatives include no chills, ear congestion, ear pain, fever, headaches or sore throat. Associated symptoms comments: Sinus pressure. The symptoms are aggravated by lying down. She has tried rest (amoxicillin) for the symptoms. The treatment provided mild relief.      Review of Systems  Constitutional: Negative for chills and fever.  HENT: Positive for postnasal drip. Negative for ear pain and sore throat.   Respiratory: Positive for cough and shortness of breath  ("maybe a little").   Musculoskeletal: Positive for myalgias.  Neurological: Negative for headaches.  All other systems reviewed and are negative.    Observations/Objective: No SOB or distress noted   Assessment and Plan: [name] comes in today with chief complaint of No chief complaint on file.   Diagnosis and orders addressed:  1. Acute bronchitis, unspecified organism - Take meds as prescribed - Use a cool mist humidifier  -Use saline nose sprays frequently -Force fluids -For any cough or congestion  Use plain Mucinex- regular strength or max strength is fine -For fever or aces or pains- take tylenol or ibuprofen. -Throat lozenges if help -Call if symptoms worsen or do not improve  - predniSONE (STERAPRED UNI-PAK 21 TAB) 10 MG (21) TBPK tablet; Use as directed  Dispense: 21 tablet; Refill: 0      I discussed the assessment and treatment plan with the patient. The patient was provided an opportunity to ask questions and all were answered. The patient agreed with the plan and demonstrated an understanding of the instructions.   The patient was advised to call back or seek an in-person evaluation if the symptoms worsen or if the condition fails to improve as anticipated.  The above assessment and management plan was discussed with the patient. The patient verbalized understanding of and has agreed to the management plan. Patient is aware to call the clinic if symptoms persist or worsen. Patient is aware when to return to the clinic for a follow-up visit. Patient educated on when it is appropriate to go to the emergency department.   Time call ended:  4:23 pm   I provided 13 minutes of non-face-to-face time during  this encounter.    [name], FNP

## 2019-11-23 ENCOUNTER — Telehealth: Payer: Self-pay | Admitting: *Deleted

## 2019-11-23 NOTE — Telephone Encounter (Signed)
Reception called patient to remind of appointment and Covid screen. Patient states she thinks she has a sinus infection and has had chest pain. States not currently having chest pain. Message sent to provider to approve seeing patient with these symptoms.

## 2019-11-24 ENCOUNTER — Ambulatory Visit (INDEPENDENT_AMBULATORY_CARE_PROVIDER_SITE_OTHER): Payer: Medicare Other | Admitting: Nurse Practitioner

## 2019-11-24 ENCOUNTER — Encounter: Payer: Self-pay | Admitting: Nurse Practitioner

## 2019-11-24 DIAGNOSIS — J0111 Acute recurrent frontal sinusitis: Secondary | ICD-10-CM

## 2019-11-24 MED ORDER — ACETAMINOPHEN 500 MG PO TABS: 500.0000 mg | ORAL_TABLET | Freq: Four times a day (QID) | ORAL | 0 refills | Status: DC | PRN

## 2019-11-24 MED ORDER — AMOXICILLIN-POT CLAVULANATE 875-125 MG PO TABS: 1.0000 | ORAL_TABLET | Freq: Two times a day (BID) | ORAL | 0 refills | Status: DC

## 2019-11-24 MED ORDER — MUCINEX 600 MG PO TB12: 600.0000 mg | ORAL_TABLET | Freq: Two times a day (BID) | ORAL | 0 refills | Status: DC

## 2019-11-24 MED ORDER — SALINE SPRAY 0.65 % NA SOLN: 1.0000 | NASAL | 2 refills | Status: DC | PRN

## 2019-11-24 NOTE — Telephone Encounter (Signed)
Okay thank you

## 2019-11-24 NOTE — Patient Instructions (Addendum)
Patient's sinusitis is not well managed.  Education provided for symptom management and home care of sinus infection.  Patient started on Augmentin for 10 days.  Educated to increase fluid and hydration, nasal flushes, Tylenol for headache, Mucinex for decongestion, and rest.  Patient knows to follow-up with worsening or unresolved symptoms. Rx sent to pharmacy.  Sinusitis, Adult Sinusitis is soreness and swelling (inflammation) of your sinuses. Sinuses are hollow spaces in the bones around your face. They are located:  Around your eyes.  In the middle of your forehead.  Behind your nose.  In your cheekbones. Your sinuses and nasal passages are lined with a fluid called mucus. Mucus drains out of your sinuses. Swelling can trap mucus in your sinuses. This lets germs (bacteria, virus, or fungus) grow, which leads to infection. Most of the time, this condition is caused by a virus. What are the causes? This condition is caused by:  Allergies.  Asthma.  Germs.  Things that block your nose or sinuses.  Growths in the nose (nasal polyps).  Chemicals or irritants in the air.  Fungus (rare). What increases the risk? You are more likely to develop this condition if:  You have a weak body defense system (immune system).  You do a lot of swimming or diving.  You use nasal sprays too much.  You smoke. What are the signs or symptoms? The main symptoms of this condition are pain and a feeling of pressure around the sinuses. Other symptoms include:  Stuffy nose (congestion).  Runny nose (drainage).  Swelling and warmth in the sinuses.  Headache.  Toothache.  A cough that may get worse at night.  Mucus that collects in the throat or the back of the nose (postnasal drip).  Being unable to smell and taste.  Being very tired (fatigue).  A fever.  Sore throat.  Bad breath. How is this diagnosed? This condition is diagnosed based on:  Your symptoms.  Your medical  history.  A physical exam.  Tests to find out if your condition is short-term (acute) or long-term (chronic). Your doctor may: ? Check your nose for growths (polyps). ? Check your sinuses using a tool that has a light (endoscope). ? Check for allergies or germs. ? Do imaging tests, such as an MRI or CT scan. How is this treated? Treatment for this condition depends on the cause and whether it is short-term or long-term.  If caused by a virus, your symptoms should go away on their own within 10 days. You may be given medicines to relieve symptoms. They include: ? Medicines that shrink swollen tissue in the nose. ? Medicines that treat allergies (antihistamines). ? A spray that treats swelling of the nostrils. ? Rinses that help get rid of thick mucus in your nose (nasal saline washes).  If caused by bacteria, your doctor may wait to see if you will get better without treatment. You may be given antibiotic medicine if you have: ? A very bad infection. ? A weak body defense system.  If caused by growths in the nose, you may need to have surgery. Follow these instructions at home: Medicines  Take, use, or apply over-the-counter and prescription medicines only as told by your doctor. These may include nasal sprays.  If you were prescribed an antibiotic medicine, take it as told by your doctor. Do not stop taking the antibiotic even if you start to feel better. Hydrate and humidify   Drink enough water to keep your pee (urine) pale  yellow.  Use a cool mist humidifier to keep the humidity level in your home above 50%.  Breathe in steam for 10-15 minutes, 3-4 times a day, or as told by your doctor. You can do this in the bathroom while a hot shower is running.  Try not to spend time in cool or dry air. Rest  Rest as much as you can.  Sleep with your head raised (elevated).  Make sure you get enough sleep each night. General instructions   Put a warm, moist washcloth on your  face 3-4 times a day, or as often as told by your doctor. This will help with discomfort.  Wash your hands often with soap and water. If there is no soap and water, use hand sanitizer.  Do not smoke. Avoid being around people who are smoking (secondhand smoke).  Keep all follow-up visits as told by your doctor. This is important. Contact a doctor if:  You have a fever.  Your symptoms get worse.  Your symptoms do not get better within 10 days. Get help right away if:  You have a very bad headache.  You cannot stop throwing up (vomiting).  You have very bad pain or swelling around your face or eyes.  You have trouble seeing.  You feel confused.  Your neck is stiff.  You have trouble breathing. Summary  Sinusitis is swelling of your sinuses. Sinuses are hollow spaces in the bones around your face.  This condition is caused by tissues in your nose that become inflamed or swollen. This traps germs. These can lead to infection.  If you were prescribed an antibiotic medicine, take it as told by your doctor. Do not stop taking it even if you start to feel better.  Keep all follow-up visits as told by your doctor. This is important. This information is not intended to replace advice given to you by your health care provider. Make sure you discuss any questions you have with your health care provider. Document Revised: 11/23/2017 Document Reviewed: 11/23/2017 Elsevier Patient Education  2020 [employer].

## 2019-11-24 NOTE — Assessment & Plan Note (Signed)
Patient's sinusitis is not well managed.  Education provided for symptom management and home care of sinus infection.  Patient started on Augmentin for 10 days.  Educated to increase fluid and hydration, nasal flushes, Tylenol for headache, Mucinex for decongestion, and rest.  Patient knows to follow-up with worsening or unresolved symptoms. Rx sent to pharmacy.

## 2019-11-24 NOTE — Telephone Encounter (Signed)
Changed appt to televisit. Patient aware can not come in with symptoms. We do not test in the office.

## 2019-11-24 NOTE — Telephone Encounter (Signed)
What policy do we have in place [facility]? Patient can come in for Covid test if we have one in office but a tele visit will first be appropriate then I can order covid-19 test  Thank you   OI

## 2019-11-24 NOTE — Progress Notes (Signed)
Patient ID: Abigail Moss, female   DOB: Nov 12, 1945, 74 y.o.   MRN: [id number]    Virtual Visit via Video Note   This visit type was conducted due to national recommendations for restrictions regarding the COVID-19 Pandemic (e.g. social distancing) in an effort to limit this patient's exposure and mitigate transmission in our community.  Due to her co-morbid illnesses, this patient is at least at moderate risk for complications without adequate follow up.  This format is felt to be most appropriate for this patient at this time.  All issues noted in this document were discussed and addressed.  A limited physical exam was performed with this format.  A verbal consent was obtained for the virtual visit.   Date:  11/24/2019   ID:  Abigail Moss, DOB 20-Mar-1946, MRN [id number]   Patient location:Home Provider location:[facility]  PCP:  Junie Spencer, FNP   Evaluation Performed:   Follow up cisit  Chief Complaint: sinusities  History of Present Illness:    Abigail Moss is a 74 y.o. female with sinusities, facial pain, fatigue, tiredness and headache in the last 7 days.  Patient was given prednisone pack which she completed with mild improvement.  Today she is having worse headaches, nasal drainage, foul-smelling nasal discharge, increased head congestion and mild cough.  Patient used Sudafed for congestion that did not provide any relief.  She is not reporting any fever, chills nausea or vomiting.  The patient does not  have symptoms concerning for COVID-19 infection (fever, chills, cough, or new shortness of breath).    Past Medical History:  Diagnosis Date  . GERD (gastroesophageal reflux disease)   . Hypertension   . Osteopenia   . Thyroid disease     Past Surgical History:  Procedure Laterality Date  . EAR TUBE REMOVAL    . ROTATOR CUFF REPAIR Left   . TONSILLECTOMY    . VEIN LIGATION AND STRIPPING      Family History  Problem Relation Age of Onset  . Heart disease Mother 85   CHF  . Diabetes Father   . Heart disease Sister        CHF  . Cancer Brother   . Cancer Sister        throat  . Stroke Brother   . COPD Brother     Social History   Socioeconomic History  . Marital status: Married    Spouse name: Not on file  . Number of children: 2  . Years of education: 63  . Highest education level: High school graduate  Occupational History  . Occupation: retired     Comment: worked at [employer]  . Smoking status: Former Smoker    Quit date: 07/07/1996    Years since quitting: 23.3  . Smokeless tobacco: Never Used  Substance and Sexual Activity  . Alcohol use: No    Alcohol/week: 0.0 standard drinks  . Drug use: No  . Sexual activity: Not on file  Other Topics Concern  . Not on file  Social History Narrative   Lives at home with husband.     Social Determinants of Health   Financial Resource Strain: Low Risk   . Difficulty of Paying Living Expenses: Not hard at all  Food Insecurity: No Food Insecurity  . Worried About Programme researcher, broadcasting/film/video in the Last Year: Never true  . Ran Out of Food in the Last Year: Never true  Transportation Needs: No Transportation Needs  .  Lack of Transportation (Medical): No  . Lack of Transportation (Non-Medical): No  Physical Activity: Insufficiently Active  . Days of Exercise per Week: 3 days  . Minutes of Exercise per Session: 30 min  Stress: No Stress Concern Present  . Feeling of Stress : Not at all  Social Connections: Slightly Isolated  . Frequency of Communication with Friends and Family: More than three times a week  . Frequency of Social Gatherings with Friends and Family: Twice a week  . Attends Religious Services: 1 to 4 times per year  . Active Member of Clubs or Organizations: No  . Attends Archivist Meetings: Never  . Marital Status: Married  Human resources officer Violence: Not At Risk  . Fear of Current or Ex-Partner: No  . Emotionally Abused: No  . Physically Abused: No  .  Sexually Abused: No    Outpatient Medications Prior to Visit  Medication Sig Dispense Refill  . acetic acid-hydrocortisone (VOSOL-HC) OTIC solution Place 4 drops into both ears 4 (four) times daily. 10 mL 2  . albuterol (VENTOLIN HFA) 108 (90 Base) MCG/ACT inhaler TAKE 2 PUFFS BY MOUTH EVERY 6 HOURS AS NEEDED FOR WHEEZE OR SHORTNESS OF BREATH 18 g 0  . alendronate (FOSAMAX) 70 MG tablet TAKE 1 TABLET BY MOUTH  WEEKLY WITH 8 OUNCE OF  PLAIN WATER 1/2 HOUR BEFORE FIRST FOOD DRINK OR MEDS.  STAY UPRIGHT FOR 1/2 HOUR 12 tablet 3  . ALPRAZolam (XANAX) 0.5 MG tablet Take 1 tablet (0.5 mg total) by mouth at bedtime as needed. 30 tablet 3  . amoxicillin (AMOXIL) 875 MG tablet Take 1 tablet (875 mg total) by mouth 2 (two) times daily. 1 po BID 20 tablet 0  . aspirin EC 81 MG tablet Take 81 mg by mouth daily.    Marland Kitchen buPROPion (WELLBUTRIN XL) 300 MG 24 hr tablet TAKE 1 TABLET BY MOUTH  DAILY 90 tablet 3  . Calcium Citrate-Vitamin D (CALCIUM CITRATE + PO) Take 600 mg by mouth daily.    . cetirizine (ZYRTEC) 10 MG tablet Take 1 tablet (10 mg total) by mouth daily. 30 tablet 11  . Cholecalciferol (VITAMIN D-3 PO) Take 800 mg by mouth daily.    . fluticasone (FLONASE) 50 MCG/ACT nasal spray SPRAY 2 SPRAYS INTO EACH NOSTRIL EVERY DAY 48 mL 1  . levothyroxine (SYNTHROID) 125 MCG tablet TAKE 1 TABLET BY MOUTH  DAILY BEFORE BREAKFAST 90 tablet 3  . losartan (COZAAR) 100 MG tablet TAKE 1 TABLET BY MOUTH  DAILY 90 tablet 3  . meloxicam (MOBIC) 15 MG tablet TAKE 1 TABLET BY MOUTH  DAILY 90 tablet 3  . montelukast (SINGULAIR) 10 MG tablet TAKE 1 TABLET BY MOUTH  DAILY AT BEDTIME 90 tablet 3  . MULTIPLE VITAMIN PO Take by mouth.    Marland Kitchen omeprazole (PRILOSEC) 40 MG capsule TAKE 1 CAPSULE BY MOUTH  DAILY 90 capsule 3  . predniSONE (STERAPRED UNI-PAK 21 TAB) 10 MG (21) TBPK tablet Use as directed 21 tablet 0   No facility-administered medications prior to visit.    Allergies:   Neomycin, Sulfur, and Latex   Social  History   Tobacco Use  . Smoking status: Former Smoker    Quit date: 07/07/1996    Years since quitting: 23.3  . Smokeless tobacco: Never Used  Substance Use Topics  . Alcohol use: No    Alcohol/week: 0.0 standard drinks  . Drug use: No     Review of Systems  Constitutional: Positive for malaise/fatigue.  Negative for chills and fever.  HENT: Positive for congestion and sinus pain. Negative for ear pain and sore throat.   Eyes: Negative for blurred vision, double vision and discharge.  Respiratory: Positive for cough. Negative for shortness of breath.   Cardiovascular: Negative for chest pain.  Gastrointestinal: Negative for nausea and vomiting.  Genitourinary: Negative.   Musculoskeletal: Positive for myalgias.  Skin: Negative for rash.  Neurological: Positive for headaches.     Labs/Other Tests and Data Reviewed:    Recent Labs: 08/19/2019: ALT 20; BUN 15; Creatinine, Ser 0.85; Hemoglobin 12.7; Platelets 254; Potassium 4.2; Sodium 141; TSH 2.710   Recent Lipid Panel Lab Results  Component Value Date/Time   CHOL 170 12/21/2017 03:07 PM   TRIG 81 12/21/2017 03:07 PM   TRIG 102 11/21/2014 11:08 AM   HDL 58 12/21/2017 03:07 PM   HDL 51 11/21/2014 11:08 AM   CHOLHDL 2.9 12/21/2017 03:07 PM   LDLCALC 96 12/21/2017 03:07 PM   LDLCALC 108 (H) 04/07/2013 11:20 AM    Wt Readings from Last 3 Encounters:  08/19/19 185 lb 3.2 oz (84 kg)  01/13/19 187 lb 9.6 oz (85.1 kg)  07/15/18 187 lb (84.8 kg)     Objective:    Vital Signs: patient did not provide information over the phone  Physical Exam: dew to type of visit, no physical exam was done. Patient is alert and oriented and not in any distress over the phone.  ASSESSMENT & PLAN:   Acute recurrent frontal sinusitis Patient's sinusitis is not well managed.  Education provided for symptom management and home care of sinus infection.  Patient started on Augmentin for 10 days.  Educated to increase fluid and hydration, nasal  flushes, Tylenol for headache, Mucinex for decongestion, and rest.  Patient knows to follow-up with worsening or unresolved symptoms. Rx sent to pharmacy.       Meds ordered this encounter  Medications  . amoxicillin-clavulanate (AUGMENTIN) 875-125 MG tablet    Sig: Take 1 tablet by mouth 2 (two) times daily.    Dispense:  20 tablet    Refill:  0    Order Specific Question:   Supervising Provider    Answer:   Arville Care A [id number]  . sodium chloride (OCEAN) 0.65 % SOLN nasal spray    Sig: Place 1 spray into both nostrils as needed for congestion.    Dispense:  60 mL    Refill:  2    Order Specific Question:   Supervising Provider    Answer:   Arville Care A [id number]  . acetaminophen (TYLENOL) 500 MG tablet    Sig: Take 1 tablet (500 mg total) by mouth every 6 (six) hours as needed.    Dispense:  30 tablet    Refill:  0    Order Specific Question:   Supervising Provider    Answer:   Arville Care A [id number]  . guaiFENesin (MUCINEX) 600 MG 12 hr tablet    Sig: Take 1 tablet (600 mg total) by mouth 2 (two) times daily.    Dispense:  28 tablet    Refill:  0    Order Specific Question:   Supervising Provider    Answer:   Arville Care A [id number]    COVID-19 Education: The signs and symptoms of COVID-19 were discussed with the patient and how to seek care for testing (follow up with PCP or arrange E-visit). The importance of social distancing was discussed today.  Time:   Today, I have  spent 15  minutes with the patient with telehealth technology discussing the above problems.    Follow Up:  As needed with unresolved or worsening symptoms  Signed, Daryll Drown, NP  11/24/2019 2:58 PM    [facility]

## 2019-12-12 ENCOUNTER — Ambulatory Visit (INDEPENDENT_AMBULATORY_CARE_PROVIDER_SITE_OTHER): Payer: Medicare Other

## 2019-12-12 ENCOUNTER — Other Ambulatory Visit: Payer: Self-pay

## 2019-12-12 DIAGNOSIS — Z23 Encounter for immunization: Secondary | ICD-10-CM

## 2019-12-12 NOTE — Progress Notes (Signed)
Patient had Td in 2007. Tdap given in left deltoid today. Pt tolerated injection well.  Pt did have a scrape on right lower abdomen within a skin fold. This has caused her concerns for wanting the vaccination.  Evaluated site. It was bright red, no swelling, not hot to touch, no drainage. Patient has been cleaning with peroxide daily and covering with gauze and surgical tape.  Instructed patient to d/c peroxide. Clean with just her regular soap and water that she showers with. Pat the area dry and apply vaseline daily. She should leave the area uncovered at night and may do so during the day unless she is going to be doing activities that will cause excessive seating or where dirt may enter site. Pt understood.

## 2019-12-28 ENCOUNTER — Ambulatory Visit (INDEPENDENT_AMBULATORY_CARE_PROVIDER_SITE_OTHER): Payer: Medicare Other | Admitting: Physician Assistant

## 2019-12-28 ENCOUNTER — Other Ambulatory Visit: Payer: Self-pay

## 2019-12-28 ENCOUNTER — Encounter: Payer: Self-pay | Admitting: Physician Assistant

## 2019-12-28 VITALS — BP 133/85 | HR 81 | Temp 97.7°F | Ht 66.0 in | Wt 185.0 lb

## 2019-12-28 DIAGNOSIS — M25551 Pain in right hip: Secondary | ICD-10-CM

## 2019-12-28 NOTE — Patient Instructions (Signed)
Hip Pain The hip is the joint between the upper legs and the lower pelvis. The bones, cartilage, tendons, and muscles of your hip joint support your body and allow you to move around. Hip pain can range from a minor ache to severe pain in one or both of your hips. The pain may be felt on the inside of the hip joint near the groin, or on the outside near the buttocks and upper thigh. You may also have swelling or stiffness in your hip area. Follow these instructions at home: Managing pain, stiffness, and swelling      If directed, put ice on the painful area. To do this: ? Put ice in a plastic bag. ? Place a towel between your skin and the bag. ? Leave the ice on for 20 minutes, 2-3 times a day.  If directed, apply heat to the affected area as often as told by your health care provider. Use the heat source that your health care provider recommends, such as a moist heat pack or a heating pad. ? Place a towel between your skin and the heat source. ? Leave the heat on for 20-30 minutes. ? Remove the heat if your skin turns bright red. This is especially important if you are unable to feel pain, heat, or cold. You may have a greater risk of getting burned. Activity  Do exercises as told by your health care provider.  Avoid activities that cause pain. General instructions   Take over-the-counter and prescription medicines only as told by your health care provider.  Keep a journal of your symptoms. Write down: ? How often you have hip pain. ? The location of your pain. ? What the pain feels like. ? What makes the pain worse.  Sleep with a pillow between your legs on your most comfortable side.  Keep all follow-up visits as told by your health care provider. This is important. Contact a health care provider if:  You cannot put weight on your leg.  Your pain or swelling continues or gets worse after one week.  It gets harder to walk.  You have a fever. Get help right away  if:  You fall.  You have a sudden increase in pain and swelling in your hip.  Your hip is red or swollen or very tender to touch. Summary  Hip pain can range from a minor ache to severe pain in one or both of your hips.  The pain may be felt on the inside of the hip joint near the groin, or on the outside near the buttocks and upper thigh.  Avoid activities that cause pain.  Write down how often you have hip pain, the location of the pain, what makes it worse, and what it feels like. This information is not intended to replace advice given to you by your health care provider. Make sure you discuss any questions you have with your health care provider. Document Revised: 11/08/2018 Document Reviewed: 11/08/2018 Elsevier Patient Education  2020 [employer] -- 

## 2019-12-28 NOTE — Progress Notes (Signed)
  Subjective:     Patient ID: Abigail Moss, female   DOB: [birth year], 74 y.o.   MRN: [id number]  HPI Pt with several week hx of intermit R lateral leg/hip pain Sx worse with change in position She will feel a " catch" that will last for less than 1 min and resolve This may happen 1-2 times per day Notices more when reaching to the R side Denies any weakness/numbness to the R lower ext Sig have sig improved since increasing her Mobic to daily No change in bowel/bladder habits  Review of Systems  Constitutional: Negative.   Cardiovascular: Negative for leg swelling.  Musculoskeletal: Positive for arthralgias, back pain and myalgias. Negative for gait problem, joint swelling, neck pain and neck stiffness.  Skin: Negative.        Objective:   Physical Exam Vitals and nursing note reviewed.  Constitutional:      General: She is not in acute distress.    Appearance: Normal appearance. She is not ill-appearing or toxic-appearing.  Neurological:     Mental Status: She is alert.   Gait nl NAD with rising from sitting position in room FROM of the R hip w/o sx No real TTP of the R hip No sx with int/ext rotation or lateral abd/adduction No sx with frog leg maneuver SLR neg FROM of the L-spine w/o sx No TTP or palp muscle spasm in L-spine DTR 1+/= lower ext Strength good and equal lower ext     Assessment:     1. Right hip pain        Plan:     Discussed with pt her sx seem more muscular in nature Continue with Mobic prn Heat/Ice OTC topical meds Activities as tol Pt asked about muscle relaxants and reviewed sedative SE - she would like to hold for now F/U prn

## 2020-01-01 ENCOUNTER — Other Ambulatory Visit: Payer: Self-pay | Admitting: Family

## 2020-01-20 ENCOUNTER — Other Ambulatory Visit: Payer: Self-pay | Admitting: Family

## 2020-01-20 DIAGNOSIS — Z1231 Encounter for screening mammogram for malignant neoplasm of breast: Secondary | ICD-10-CM

## 2020-01-21 ENCOUNTER — Other Ambulatory Visit: Payer: Self-pay | Admitting: Family

## 2020-01-21 DIAGNOSIS — J302 Other seasonal allergic rhinitis: Secondary | ICD-10-CM

## 2020-01-30 ENCOUNTER — Ambulatory Visit (INDEPENDENT_AMBULATORY_CARE_PROVIDER_SITE_OTHER): Payer: Medicare Other | Admitting: Family Medicine

## 2020-01-30 ENCOUNTER — Other Ambulatory Visit: Payer: Self-pay

## 2020-01-30 ENCOUNTER — Encounter: Payer: Self-pay | Admitting: Family Medicine

## 2020-01-30 VITALS — BP 125/74 | HR 78 | Temp 97.7°F | Ht 66.0 in | Wt 183.0 lb

## 2020-01-30 DIAGNOSIS — M1612 Unilateral primary osteoarthritis, left hip: Secondary | ICD-10-CM

## 2020-01-30 MED ORDER — PREDNISONE 10 MG PO TABS: ORAL_TABLET | ORAL | 0 refills | Status: DC

## 2020-01-30 NOTE — Progress Notes (Signed)
Chief Complaint  Patient presents with  . Leg Pain    Left, Muscle pain    HPI  Patient presents today for 2 weeks of increasing pain and being lateral left lower extremity.  She reports that going from the posterior hip but not into the buttocks.  It does not go into the back.  It radiates down the lateral aspect of the thigh and calf all the way to the ankle.  Patient has a history of arthritis.  She has been doing a lot of gardening recently.  It was getting better and she now implanted some more flowers and it was back within a day or so.  She denies any pain on the right.  Pain is moderate severity.  It is worse when she first gets up.  Gets better after he loosens up.  Then it comes  back as she uses more.    PMH: Smoking status noted ROS: Per HPI  Objective: BP 125/74   Pulse 78   Temp 97.7 F (36.5 C) (Temporal)   Ht 5\' 6"  (1.676 m)   Wt 183 lb (83 kg)   BMI 29.54 kg/m  Gen: NAD, alert, cooperative with exam HEENT: NCAT, EOMI, PERRL CV: RRR, good S1/S2, no murmur Resp: CTABL, no wheezes, non-labored Abd: SNTND, BS present, no guarding or organomegaly Ext: No edema, warm Neuro: Alert and oriented, No gross deficits  Assessment and plan:  1. Arthritis of left hip     Meds ordered this encounter  Medications  . predniSONE (DELTASONE) 10 MG tablet    Sig: Take 5 daily for 3 days followed by 4,3,2 and 1 for 3 days each.    Dispense:  45 tablet    Refill:  0    No orders of the defined types were placed in this encounter.   Follow up as needed.  , MD

## 2020-02-07 ENCOUNTER — Other Ambulatory Visit: Payer: Self-pay

## 2020-02-07 ENCOUNTER — Ambulatory Visit (INDEPENDENT_AMBULATORY_CARE_PROVIDER_SITE_OTHER): Payer: Medicare Other | Admitting: Family

## 2020-02-07 ENCOUNTER — Encounter: Payer: Self-pay | Admitting: Family

## 2020-02-07 VITALS — BP 136/84 | HR 82 | Temp 96.9°F | Wt 184.2 lb

## 2020-02-07 DIAGNOSIS — N3000 Acute cystitis without hematuria: Secondary | ICD-10-CM | POA: Diagnosis not present

## 2020-02-07 DIAGNOSIS — R300 Dysuria: Secondary | ICD-10-CM

## 2020-02-07 LAB — URINALYSIS, COMPLETE

## 2020-02-07 LAB — MICROSCOPIC EXAMINATION
Bacteria, UA: NONE SEEN
RBC, Urine: NONE SEEN /hpf (ref 0–2)
WBC, UA: NONE SEEN /hpf (ref 0–5)

## 2020-02-07 MED ORDER — AMOXICILLIN-POT CLAVULANATE 875-125 MG PO TABS: 1.0000 | ORAL_TABLET | Freq: Two times a day (BID) | ORAL | 0 refills | Status: DC

## 2020-02-07 MED ORDER — FLUCONAZOLE 150 MG PO TABS
150.0000 mg | ORAL_TABLET | ORAL | 0 refills | Status: DC | PRN
Start: 2020-02-07 — End: 2020-02-16

## 2020-02-07 NOTE — Progress Notes (Signed)
Subjective:    Patient ID: Abigail Moss, female    DOB: 1945-12-16, 74 y.o.   MRN: [id number]  Chief Complaint  Patient presents with  . Urinary Frequency    taken azo, been going x 1 week, denies blood in urine  . Dysuria    Urinary Frequency  This is a new problem. The current episode started in the past 7 days. The problem occurs every urination. The problem has been gradually worsening. The quality of the pain is described as burning. The pain is at a severity of 8/10. The pain is moderate. Associated symptoms include frequency, nausea and urgency. Pertinent negatives include no discharge, flank pain, hematuria or vomiting. She has tried increased fluids and antibiotics (AZO, she also had two days of Augmentin that she started. States she feeling better since starting these. ) for the symptoms. The treatment provided mild relief.  Dysuria  Associated symptoms include frequency, nausea and urgency. Pertinent negatives include no discharge, flank pain, hematuria or vomiting.      Review of Systems  Gastrointestinal: Positive for nausea. Negative for vomiting.  Genitourinary: Positive for dysuria, frequency and urgency. Negative for flank pain and hematuria.  All other systems reviewed and are negative.      Objective:   Physical Exam Vitals reviewed.  Constitutional:      General: She is not in acute distress.    Appearance: She is well-developed.  HENT:     Head: Normocephalic and atraumatic.     Right Ear: Tympanic membrane normal.     Left Ear: Tympanic membrane normal.  Eyes:     Pupils: Pupils are equal, round, and reactive to light.  Neck:     Thyroid: No thyromegaly.  Cardiovascular:     Rate and Rhythm: Normal rate and regular rhythm.     Heart sounds: Normal heart sounds. No murmur heard.   Pulmonary:     Effort: Pulmonary effort is normal. No respiratory distress.     Breath sounds: Normal breath sounds. No wheezing.  Abdominal:     General: Bowel sounds  are normal. There is no distension.     Palpations: Abdomen is soft.     Tenderness: There is no abdominal tenderness.  Musculoskeletal:        General: No tenderness. Normal range of motion.     Cervical back: Normal range of motion and neck supple.  Skin:    General: Skin is warm and dry.  Neurological:     Mental Status: She is alert and oriented to person, place, and time.     Cranial Nerves: No cranial nerve deficit.     Deep Tendon Reflexes: Reflexes are normal and symmetric.  Psychiatric:        Behavior: Behavior normal.        Thought Content: Thought content normal.        Judgment: Judgment normal.       BP 136/84   Pulse 82   Temp (!) 96.9 F (36.1 C) (Temporal)   Wt 184 lb 3.2 oz (83.6 kg)   SpO2 96%   BMI 29.73 kg/m      Assessment & Plan:  LINDSEA OLIVAR comes in today with chief complaint of Urinary Frequency (taken azo, been going x 1 week, denies blood in urine) and Dysuria   Diagnosis and orders addressed:  1. Dysuria - Urinalysis, Complete - Urine Culture - amoxicillin-clavulanate (AUGMENTIN) 875-125 MG tablet; Take 1 tablet by mouth 2 (two) times daily.  Dispense: 14 tablet; Refill: 0  2. Acute cystitis without hematuria Force fluids AZO over the counter X2 days RTO prn Culture pending - amoxicillin-clavulanate (AUGMENTIN) 875-125 MG tablet; Take 1 tablet by mouth 2 (two) times daily.  Dispense: 14 tablet; Refill: 0     Jannifer Rodney, FNP

## 2020-02-07 NOTE — Patient Instructions (Signed)

## 2020-02-09 LAB — URINE CULTURE

## 2020-02-16 ENCOUNTER — Encounter: Payer: Self-pay | Admitting: Family

## 2020-02-16 ENCOUNTER — Other Ambulatory Visit: Payer: Self-pay

## 2020-02-16 ENCOUNTER — Ambulatory Visit: Payer: Medicare Other

## 2020-02-16 ENCOUNTER — Ambulatory Visit (INDEPENDENT_AMBULATORY_CARE_PROVIDER_SITE_OTHER): Payer: Medicare Other | Admitting: Family

## 2020-02-16 VITALS — BP 122/84 | HR 84 | Temp 97.6°F | Ht 66.0 in | Wt 184.0 lb

## 2020-02-16 DIAGNOSIS — Z1211 Encounter for screening for malignant neoplasm of colon: Secondary | ICD-10-CM

## 2020-02-16 DIAGNOSIS — F411 Generalized anxiety disorder: Secondary | ICD-10-CM | POA: Diagnosis not present

## 2020-02-16 DIAGNOSIS — Z79899 Other long term (current) drug therapy: Secondary | ICD-10-CM

## 2020-02-16 DIAGNOSIS — F1320 Sedative, hypnotic or anxiolytic dependence, uncomplicated: Secondary | ICD-10-CM

## 2020-02-16 DIAGNOSIS — K219 Gastro-esophageal reflux disease without esophagitis: Secondary | ICD-10-CM | POA: Diagnosis not present

## 2020-02-16 DIAGNOSIS — I10 Essential (primary) hypertension: Secondary | ICD-10-CM | POA: Diagnosis not present

## 2020-02-16 DIAGNOSIS — E559 Vitamin D deficiency, unspecified: Secondary | ICD-10-CM

## 2020-02-16 DIAGNOSIS — E039 Hypothyroidism, unspecified: Secondary | ICD-10-CM

## 2020-02-16 DIAGNOSIS — E669 Obesity, unspecified: Secondary | ICD-10-CM

## 2020-02-16 MED ORDER — BUPROPION HCL ER (XL) 150 MG PO TB24: 150.0000 mg | ORAL_TABLET | Freq: Every day | ORAL | 2 refills | Status: DC

## 2020-02-16 MED ORDER — HYDROCORTISONE-ACETIC ACID 1-2 % OT SOLN: 4.0000 [drp] | Freq: Four times a day (QID) | OTIC | 2 refills | Status: DC

## 2020-02-16 MED ORDER — ALPRAZOLAM 0.5 MG PO TABS: 0.5000 mg | ORAL_TABLET | Freq: Every evening | ORAL | 3 refills | Status: DC | PRN

## 2020-02-16 NOTE — Progress Notes (Signed)
Subjective:    Patient ID: Abigail Moss, female    DOB: [birth year], 74 y.o.   MRN: [id number]  Chief Complaint  Patient presents with  . Medical Management of Chronic Issues    Wants to to cut back wellbutrine, thinks it keeps her awake   . Hypertension   Pt presents to the office today for chronic follow up. She reports she wants to decrease her Wellbutrin today because she feels like this is causing insomnia.  Hypertension This is a chronic problem. The current episode started more than 1 year ago. The problem has been resolved since onset. The problem is controlled. Associated symptoms include anxiety and malaise/fatigue. Pertinent negatives include no peripheral edema or shortness of breath. Risk factors for coronary artery disease include dyslipidemia, obesity and sedentary lifestyle. The current treatment provides moderate improvement. There is no history of kidney disease, CAD/MI, CVA or heart failure. Identifiable causes of hypertension include a thyroid problem.  Gastroesophageal Reflux She complains of belching and heartburn. This is a chronic problem. The current episode started more than 1 year ago. The problem occurs occasionally. The problem has been waxing and waning. The symptoms are aggravated by certain foods. Associated symptoms include fatigue. Risk factors include obesity. She has tried a PPI for the symptoms. The treatment provided moderate relief.  Thyroid Problem Presents for follow-up visit. Symptoms include anxiety and fatigue. Patient reports no depressed mood. The symptoms have been stable. There is no history of heart failure.  Anxiety Presents for follow-up visit. Symptoms include excessive worry, irritability and nervous/anxious behavior. Patient reports no depressed mood or shortness of breath.        Review of Systems  Constitutional: Positive for fatigue, irritability and malaise/fatigue.  Respiratory: Negative for shortness of breath.     Gastrointestinal: Positive for heartburn.  Psychiatric/Behavioral: The patient is nervous/anxious.   All other systems reviewed and are negative.      Objective:   Physical Exam Vitals reviewed.  Constitutional:      General: She is not in acute distress.    Appearance: She is well-developed. She is obese.  HENT:     Head: Normocephalic and atraumatic.     Right Ear: Tympanic membrane normal.     Left Ear: Tympanic membrane normal.  Eyes:     Pupils: Pupils are equal, round, and reactive to light.  Neck:     Thyroid: No thyromegaly.  Cardiovascular:     Rate and Rhythm: Normal rate and regular rhythm.     Heart sounds: Normal heart sounds. No murmur heard.   Pulmonary:     Effort: Pulmonary effort is normal. No respiratory distress.     Breath sounds: Normal breath sounds. No wheezing.  Abdominal:     General: Bowel sounds are normal. There is no distension.     Palpations: Abdomen is soft.     Tenderness: There is no abdominal tenderness.  Musculoskeletal:        General: No tenderness. Normal range of motion.     Cervical back: Normal range of motion and neck supple.  Skin:    General: Skin is warm and dry.  Neurological:     Mental Status: She is alert and oriented to person, place, and time.     Cranial Nerves: No cranial nerve deficit.     Deep Tendon Reflexes: Reflexes are normal and symmetric.  Psychiatric:        Behavior: Behavior normal.        Thought Content:  Thought content normal.        Judgment: Judgment normal.        BP 122/84   Pulse 84   Temp 97.6 F (36.4 C) (Temporal)   Ht _0  (1.676 m)   Wt 184 lb (83.5 kg)   SpO2 97%   BMI 29.70 kg/m   Assessment & Plan:  Abigail Moss comes in today with chief complaint of Medical Management of Chronic Issues (Wants to to cut back wellbutrine, thinks it keeps her awake ) and Hypertension   Diagnosis and orders addressed:  1. Benzodiazepine dependence (HCC) - ALPRAZolam (XANAX) 0.5 MG tablet;  Take 1 tablet (0.5 mg total) by mouth at bedtime as needed.  Dispense: 30 tablet; Refill: 3 - CMP14+EGFR - CBC with Differential/Platelet  2. Controlled substance agreement signed - ALPRAZolam (XANAX) 0.5 MG tablet; Take 1 tablet (0.5 mg total) by mouth at bedtime as needed.  Dispense: 30 tablet; Refill: 3 - CMP14+EGFR - CBC with Differential/Platelet  3. GAD (generalized anxiety disorder) -Will decrease Wellbutrin to 150 mg from 300 mg  - ALPRAZolam (XANAX) 0.5 MG tablet; Take 1 tablet (0.5 mg total) by mouth at bedtime as needed.  Dispense: 30 tablet; Refill: 3 - CMP14+EGFR - CBC with Differential/Platelet - buPROPion (WELLBUTRIN XL) 150 MG 24 hr tablet; Take 1 tablet (150 mg total) by mouth daily.  Dispense: 90 tablet; Refill: 2  4. Essential hypertension - CMP14+EGFR - CBC with Differential/Platelet  5. Gastroesophageal reflux disease, unspecified whether esophagitis present  - CMP14+EGFR - CBC with Differential/Platelet  6. Hypothyroidism, unspecified type - CMP14+EGFR - CBC with Differential/Platelet - TSH  7. Vitamin D deficiency - CMP14+EGFR - CBC with Differential/Platelet - VITAMIN D 25 Hydroxy (Vit-D Deficiency, Fractures)  8. Obesity (BMI 30-39.9)  - CMP14+EGFR - CBC with Differential/Platelet  9. Colon cancer screening  - Cologuard - CMP14+EGFR - CBC with Differential/Platelet   Labs pending Continue current meds- low fat diet and exercise and recheck in 3 months Health Maintenance reviewed Diet and exercise encouraged  Follow up plan: 6 months   [name], FNP

## 2020-02-16 NOTE — Patient Instructions (Signed)
Health Maintenance After Age 76 After age 76, you are at a higher risk for certain long-term diseases and infections as well as injuries from falls. Falls are a major cause of broken bones and head injuries in people who are older than age 76. Getting regular preventive care can help to keep you healthy and well. Preventive care includes getting regular testing and making lifestyle changes as recommended by your health care provider. Talk with your health care provider about:  Which screenings and tests you should have. A screening is a test that checks for a disease when you have no symptoms.  A diet and exercise plan that is right for you. What should I know about screenings and tests to prevent falls? Screening and testing are the best ways to find a health problem early. Early diagnosis and treatment give you the best chance of managing medical conditions that are common after age 76. Certain conditions and lifestyle choices may make you more likely to have a fall. Your health care provider may recommend:  Regular vision checks. Poor vision and conditions such as cataracts can make you more likely to have a fall. If you wear glasses, make sure to get your prescription updated if your vision changes.  Medicine review. Work with your health care provider to regularly review all of the medicines you are taking, including over-the-counter medicines. Ask your health care provider about any side effects that may make you more likely to have a fall. Tell your health care provider if any medicines that you take make you feel dizzy or sleepy.  Osteoporosis screening. Osteoporosis is a condition that causes the bones to get weaker. This can make the bones weak and cause them to break more easily.  Blood pressure screening. Blood pressure changes and medicines to control blood pressure can make you feel dizzy.  Strength and balance checks. Your health care provider may recommend certain tests to check your  strength and balance while standing, walking, or changing positions.  Foot health exam. Foot pain and numbness, as well as not wearing proper footwear, can make you more likely to have a fall.  Depression screening. You may be more likely to have a fall if you have a fear of falling, feel emotionally low, or feel unable to do activities that you used to do.  Alcohol use screening. Using too much alcohol can affect your balance and may make you more likely to have a fall. What actions can I take to lower my risk of falls? General instructions  Talk with your health care provider about your risks for falling. Tell your health care provider if: ? You fall. Be sure to tell your health care provider about all falls, even ones that seem minor. ? You feel dizzy, sleepy, or off-balance.  Take over-the-counter and prescription medicines only as told by your health care provider. These include any supplements.  Eat a healthy diet and maintain a healthy weight. A healthy diet includes low-fat dairy products, low-fat (lean) meats, and fiber from whole grains, beans, and lots of fruits and vegetables. Home safety  Remove any tripping hazards, such as rugs, cords, and clutter.  Install safety equipment such as grab bars in bathrooms and safety rails on stairs.  Keep rooms and walkways well-lit. Activity   Follow a regular exercise program to stay fit. This will help you maintain your balance. Ask your health care provider what types of exercise are appropriate for you.  If you need a cane or   walker, use it as recommended by your health care provider.  Wear supportive shoes that have nonskid soles. Lifestyle  Do not drink alcohol if your health care provider tells you not to drink.  If you drink alcohol, limit how much you have: ? 0-1 drink a day for women. ? 0-2 drinks a day for men.  Be aware of how much alcohol is in your drink. In the [place] one drink equals one typical bottle of beer (12  oz), one-half glass of wine (5 oz), or one shot of hard liquor (1 oz).  Do not use any products that contain nicotine or tobacco, such as cigarettes and e-cigarettes. If you need help quitting, ask your health care provider. Summary  Having a healthy lifestyle and getting preventive care can help to protect your health and wellness after age 76.  Screening and testing are the best way to find a health problem early and help you avoid having a fall. Early diagnosis and treatment give you the best chance for managing medical conditions that are more common for people who are older than age 76.  Falls are a major cause of broken bones and head injuries in people who are older than age 76. Take precautions to prevent a fall at home.  Work with your health care provider to learn what changes you can make to improve your health and wellness and to prevent falls. This information is not intended to replace advice given to you by your health care provider. Make sure you discuss any questions you have with your health care provider. Document Revised: 10/14/2018 Document Reviewed: 05/06/2017 Elsevier Patient Education  2020 [employer]  

## 2020-02-17 ENCOUNTER — Other Ambulatory Visit: Payer: Self-pay | Admitting: Family

## 2020-02-17 LAB — CBC WITH DIFFERENTIAL/PLATELET
Basophils Absolute: 0 10*3/uL (ref 0.0–0.2)
Basos: 1 %
EOS (ABSOLUTE): 0.2 10*3/uL (ref 0.0–0.4)
Eos: 3 %
Hematocrit: 37.8 % (ref 34.0–46.6)
Hemoglobin: 12.4 g/dL (ref 11.1–15.9)
Immature Grans (Abs): 0 10*3/uL (ref 0.0–0.1)
Immature Granulocytes: 0 %
Lymphocytes Absolute: 1.2 10*3/uL (ref 0.7–3.1)
Lymphs: 23 %
MCH: 31.4 pg (ref 26.6–33.0)
MCHC: 32.8 g/dL (ref 31.5–35.7)
MCV: 96 fL (ref 79–97)
Monocytes Absolute: 0.4 10*3/uL (ref 0.1–0.9)
Monocytes: 8 %
Neutrophils Absolute: 3.5 10*3/uL (ref 1.4–7.0)
Neutrophils: 65 %
Platelets: 260 10*3/uL (ref 150–450)
RBC: 3.95 x10E6/uL (ref 3.77–5.28)
RDW: 12.3 % (ref 11.7–15.4)
WBC: 5.4 10*3/uL (ref 3.4–10.8)

## 2020-02-17 LAB — CMP14+EGFR
ALT: 27 IU/L (ref 0–32)
AST: 21 IU/L (ref 0–40)
Albumin/Globulin Ratio: 1.6 (ref 1.2–2.2)
Albumin: 3.9 g/dL (ref 3.7–4.7)
Alkaline Phosphatase: 59 IU/L (ref 48–121)
BUN/Creatinine Ratio: 20 (ref 12–28)
BUN: 16 mg/dL (ref 8–27)
Bilirubin Total: 0.6 mg/dL (ref 0.0–1.2)
CO2: 26 mmol/L (ref 20–29)
Calcium: 9.2 mg/dL (ref 8.7–10.3)
Chloride: 103 mmol/L (ref 96–106)
Creatinine, Ser: 0.81 mg/dL (ref 0.57–1.00)
GFR calc Af Amer: 82 mL/min/{1.73_m2} (ref >59)
GFR calc non Af Amer: 71 mL/min/{1.73_m2} (ref >59)
Globulin, Total: 2.5 g/dL (ref 1.5–4.5)
Glucose: 83 mg/dL (ref 65–99)
Potassium: 4.2 mmol/L (ref 3.5–5.2)
Sodium: 143 mmol/L (ref 134–144)
Total Protein: 6.4 g/dL (ref 6.0–8.5)

## 2020-02-17 LAB — VITAMIN D 25 HYDROXY (VIT D DEFICIENCY, FRACTURES): Vit D, 25-Hydroxy: 55.3 ng/mL (ref 30.0–100.0)

## 2020-02-17 LAB — TSH: TSH: 7.55 u[IU]/mL — ABNORMAL HIGH (ref 0.450–4.500)

## 2020-02-17 MED ORDER — LEVOTHYROXINE SODIUM 150 MCG PO TABS
150.0000 ug | ORAL_TABLET | Freq: Every day | ORAL | 11 refills | Status: DC
Start: 2020-02-17 — End: 2020-05-01

## 2020-02-20 ENCOUNTER — Ambulatory Visit (INDEPENDENT_AMBULATORY_CARE_PROVIDER_SITE_OTHER): Payer: Medicare Other | Admitting: *Deleted

## 2020-02-20 DIAGNOSIS — Z0000 Encounter for general adult medical examination without abnormal findings: Secondary | ICD-10-CM

## 2020-02-20 NOTE — Patient Instructions (Signed)
Preventive Care 38 Years and Older, Female Preventive care refers to lifestyle choices and visits with your health care provider that can promote health and wellness. This includes:  A yearly physical exam. This is also called an annual well check.  Regular dental and eye exams.  Immunizations.  Screening for certain conditions.  Healthy lifestyle choices, such as diet and exercise. What can I expect for my preventive care visit? Physical exam Your health care provider will check:  Height and weight. These may be used to calculate body mass index (BMI), which is a measurement that tells if you are at a healthy weight.  Heart rate and blood pressure.  Your skin for abnormal spots. Counseling Your health care provider may ask you questions about:  Alcohol, tobacco, and drug use.  Emotional well-being.  Home and relationship well-being.  Sexual activity.  Eating habits.  History of falls.  Memory and ability to understand (cognition).  Work and work Statistician.  Pregnancy and menstrual history. What immunizations do I need?  Influenza (flu) vaccine  This is recommended every year. Tetanus, diphtheria, and pertussis (Tdap) vaccine  You may need a Td booster every 10 years. Varicella (chickenpox) vaccine  You may need this vaccine if you have not already been vaccinated. Zoster (shingles) vaccine  You may need this after age 33. Pneumococcal conjugate (PCV13) vaccine  One dose is recommended after age 33. Pneumococcal polysaccharide (PPSV23) vaccine  One dose is recommended after age 72. Measles, mumps, and rubella (MMR) vaccine  You may need at least one dose of MMR if you were born in 1957 or later. You may also need a second dose. Meningococcal conjugate (MenACWY) vaccine  You may need this if you have certain conditions. Hepatitis A vaccine  You may need this if you have certain conditions or if you travel or work in places where you may be exposed  to hepatitis A. Hepatitis B vaccine  You may need this if you have certain conditions or if you travel or work in places where you may be exposed to hepatitis B. Haemophilus influenzae type b (Hib) vaccine  You may need this if you have certain conditions. You may receive vaccines as individual doses or as more than one vaccine together in one shot (combination vaccines). Talk with your health care provider about the risks and benefits of combination vaccines. What tests do I need? Blood tests  Lipid and cholesterol levels. These may be checked every 5 years, or more frequently depending on your overall health.  Hepatitis C test.  Hepatitis B test. Screening  Lung cancer screening. You may have this screening every year starting at age 39 if you have a 30-pack-year history of smoking and currently smoke or have quit within the past 15 years.  Colorectal cancer screening. All adults should have this screening starting at age 36 and continuing until age 15. Your health care provider may recommend screening at age 23 if you are at increased risk. You will have tests every 1-10 years, depending on your results and the type of screening test.  Diabetes screening. This is done by checking your blood sugar (glucose) after you have not eaten for a while (fasting). You may have this done every 1-3 years.  Mammogram. This may be done every 1-2 years. Talk with your health care provider about how often you should have regular mammograms.  BRCA-related cancer screening. This may be done if you have a family history of breast, ovarian, tubal, or peritoneal cancers.  Other tests  Sexually transmitted disease (STD) testing.  Bone density scan. This is done to screen for osteoporosis. You may have this done starting at age 44. Follow these instructions at home: Eating and drinking  Eat a diet that includes fresh fruits and vegetables, whole grains, lean protein, and low-fat dairy products. Limit  your intake of foods with high amounts of sugar, saturated fats, and salt.  Take vitamin and mineral supplements as recommended by your health care provider.  Do not drink alcohol if your health care provider tells you not to drink.  If you drink alcohol: ? Limit how much you have to 0-1 drink a day. ? Be aware of how much alcohol is in your drink. In the [place] one drink equals one 12 oz bottle of beer (355 mL), one 5 oz glass of wine (148 mL), or one 1 oz glass of hard liquor (44 mL). Lifestyle  Take daily care of your teeth and gums.  Stay active. Exercise for at least 30 minutes on 5 or more days each week.  Do not use any products that contain nicotine or tobacco, such as cigarettes, e-cigarettes, and chewing tobacco. If you need help quitting, ask your health care provider.  If you are sexually active, practice safe sex. Use a condom or other form of protection in order to prevent STIs (sexually transmitted infections).  Talk with your health care provider about taking a low-dose aspirin or statin. What's next?  Go to your health care provider once a year for a well check visit.  Ask your health care provider how often you should have your eyes and teeth checked.  Stay up to date on all vaccines. This information is not intended to replace advice given to you by your health care provider. Make sure you discuss any questions you have with your health care provider. Document Revised: 06/17/2018 Document Reviewed: 06/17/2018 Elsevier Patient Education  2020 [employer].

## 2020-02-20 NOTE — Progress Notes (Signed)
MEDICARE ANNUAL WELLNESS VISIT  02/20/2020  Telephone Visit Disclaimer This Medicare AWV was conducted by telephone due to national recommendations for restrictions regarding the COVID-19 Pandemic (e.g. social distancing).  I verified, using two identifiers, that I am speaking with Abigail Moss or their authorized healthcare agent. I discussed the limitations, risks, security, and privacy concerns of performing an evaluation and management service by telephone and the potential availability of an in-person appointment in the future. The patient expressed understanding and agreed to proceed.   Subjective:  Abigail Moss is a 74 y.o. female patient of [name], [name], FNP who had a Medicare Annual Wellness Visit today via telephone. [name] is Retired and lives with their spouse. she has 2 children. she reports that she is socially active and does interact with friends/family regularly. she is minimally physically active and enjoys reading, sewing, watching TV and gardening.  Patient Care Team: Junie Spencer, FNP as PCP - General (Family Medicine) Hart Carwin, MD (Inactive) as Consulting Physician (Gastroenterology) Landry Mellow, AUD (Audiology) Michaelle Copas, MD as Referring Physician (Optometry)  Advanced Directives 02/20/2020 02/10/2019 11/21/2014  Does Patient Have a Medical Advance Directive? No No No  Would patient like information on creating a medical advance directive? No - Patient declined No - Patient declined Yes - Educational materials given    Hospital Utilization Over the Past 12 Months: # of hospitalizations or ER visits: 0 # of surgeries: 0  Review of Systems    Patient reports that her overall health is unchanged compared to last year.  History obtained from chart review and the patient  Patient Reported Readings (BP, Pulse, CBG, Weight, etc) none  Pain Assessment Pain : No/denies pain     Current Medications & Allergies (verified) Allergies as of  02/20/2020      Reactions   Neomycin    REACTION: itching   Sulfur    REACTION: itching   Latex Rash      Medication List       Accurate as of February 20, 2020 10:27 AM. If you have any questions, ask your nurse or doctor.        acetaminophen 500 MG tablet Commonly known as: TYLENOL Take 1 tablet (500 mg total) by mouth every 6 (six) hours as needed.   acetic acid-hydrocortisone OTIC solution Commonly known as: VOSOL-HC Place 4 drops into both ears 4 (four) times daily.   albuterol 108 (90 Base) MCG/ACT inhaler Commonly known as: VENTOLIN HFA TAKE 2 PUFFS BY MOUTH EVERY 6 HOURS AS NEEDED FOR WHEEZE OR SHORTNESS OF BREATH   alendronate 70 MG tablet Commonly known as: FOSAMAX TAKE 1 TABLET BY MOUTH  WEEKLY WITH 8 OUNCE OF  PLAIN WATER 1/2 HOUR BEFORE FIRST FOOD DRINK OR MEDS.  STAY UPRIGHT FOR 1/2 HOUR   ALPRAZolam 0.5 MG tablet Commonly known as: XANAX Take 1 tablet (0.5 mg total) by mouth at bedtime as needed.   aspirin EC 81 MG tablet Take 81 mg by mouth daily.   buPROPion 150 MG 24 hr tablet Commonly known as: Wellbutrin XL Take 1 tablet (150 mg total) by mouth daily.   CALCIUM CITRATE + PO Take 600 mg by mouth daily.   fluticasone 50 MCG/ACT nasal spray Commonly known as: FLONASE SPRAY 2 SPRAYS INTO EACH NOSTRIL EVERY DAY   levothyroxine 150 MCG tablet Commonly known as: Synthroid Take 1 tablet (150 mcg total) by mouth daily.   losartan 100 MG tablet Commonly known  as: COZAAR TAKE 1 TABLET BY MOUTH  DAILY   meloxicam 15 MG tablet Commonly known as: MOBIC TAKE 1 TABLET BY MOUTH  DAILY   montelukast 10 MG tablet Commonly known as: SINGULAIR TAKE 1 TABLET BY MOUTH  DAILY AT BEDTIME   Mucinex 600 MG 12 hr tablet Generic drug: guaiFENesin Take 1 tablet (600 mg total) by mouth 2 (two) times daily.   MULTIPLE VITAMIN PO Take by mouth.   omeprazole 40 MG capsule Commonly known as: PRILOSEC TAKE 1 CAPSULE BY MOUTH  DAILY   sodium chloride 0.65 %  Soln nasal spray Commonly known as: OCEAN Place 1 spray into both nostrils as needed for congestion.   VITAMIN D-3 PO Take 800 mg by mouth daily.       History (reviewed): Past Medical History:  Diagnosis Date  . GERD (gastroesophageal reflux disease)   . Hypertension   . Osteopenia   . Thyroid disease    Past Surgical History:  Procedure Laterality Date  . EAR TUBE REMOVAL    . ROTATOR CUFF REPAIR Left   . TONSILLECTOMY    . VEIN LIGATION AND STRIPPING     Family History  Problem Relation Age of Onset  . Heart disease Mother [id number]       CHF  . Diabetes Father   . Heart disease Sister        CHF  . Cancer Brother   . Cancer Sister        throat  . Stroke Brother   . COPD Brother    Social History   Socioeconomic History  . Marital status: Married    Spouse name: Simona Huharl  . Number of children: 2  . Years of education: 9412  . Highest education level: High school graduate  Occupational History  . Occupation: retired     Comment: worked at [employer]  Tobacco Use  . Smoking status: Former Smoker    Quit date: 07/07/1996    Years since quitting: 23.6  . Smokeless tobacco: Never Used  Vaping Use  . Vaping Use: Never used  Substance and Sexual Activity  . Alcohol use: No    Alcohol/week: 0.0 standard drinks  . Drug use: No  . Sexual activity: Not Currently  Other Topics Concern  . Not on file  Social History Narrative   Lives at home with husband.     Social Determinants of Health   Financial Resource Strain: Low Risk   . Difficulty of Paying Living Expenses: Not hard at all  Food Insecurity: No Food Insecurity  . Worried About Programme researcher, broadcasting/film/videounning Out of Food in the Last Year: Never true  . Ran Out of Food in the Last Year: Never true  Transportation Needs: No Transportation Needs  . Lack of Transportation (Medical): No  . Lack of Transportation (Non-Medical): No  Physical Activity: Inactive  . Days of Exercise per Week: 0 days  . Minutes of Exercise per Session: 0 min    Stress: No Stress Concern Present  . Feeling of Stress : Not at all  Social Connections: Socially Integrated  . Frequency of Communication with Friends and Family: More than three times a week  . Frequency of Social Gatherings with Friends and Family: More than three times a week  . Attends Religious Services: More than 4 times per year  . Active Member of Clubs or Organizations: Yes  . Attends BankerClub or Organization Meetings: More than 4 times per year  . Marital Status: Married  Activities of Daily Living In your present state of health, do you have any difficulty performing the following activities: 02/20/2020  Hearing? Y  Comment wears bilateral hearing aids  Vision? N  Difficulty concentrating or making decisions? N  Walking or climbing stairs? N  Dressing or bathing? N  Doing errands, shopping? N  Preparing Food and eating ? N  Using the Toilet? N  In the past six months, have you accidently leaked urine? N  Do you have problems with loss of bowel control? N  Managing your Medications? N  Managing your Finances? N  Housekeeping or managing your Housekeeping? N  Some recent data might be hidden    Patient Education/ Literacy How often do you need to have someone help you when you read instructions, pamphlets, or other written materials from your doctor or pharmacy?: 1 - Never What is the last grade level you completed in school?: 12th grade  Exercise Current Exercise Habits: The patient does not participate in regular exercise at present, Exercise limited by: None identified  Diet Patient reports consuming 2 meals a day and 1 snack(s) a day Patient reports that her primary diet is: Regular Patient reports that she does have regular access to food.   Depression Screen PHQ 2/9 Scores 02/20/2020 02/07/2020 01/30/2020 12/28/2019 11/01/2019 02/10/2019 01/13/2019  PHQ - 2 Score 0 0 0 0 0 0 0  PHQ- 9 Score - - - - - - -     Fall Risk Fall Risk  02/20/2020 02/16/2020 02/07/2020  01/30/2020 12/28/2019  Falls in the past year? 0 0 0 0 0  Number falls in past yr: - - - 0 0  Injury with Fall? - - - 0 0  Comment - - - - -  Risk for fall due to : - - - No Fall Risks No Fall Risks  Follow up - - - Falls evaluation completed Falls evaluation completed     Objective:  SMITA LESH seemed alert and oriented and she participated appropriately during our telephone visit.  Blood Pressure Weight BMI  BP Readings from Last 3 Encounters:  02/16/20 122/84  02/07/20 136/84  01/30/20 125/74   Wt Readings from Last 3 Encounters:  02/16/20 184 lb (83.5 kg)  02/07/20 184 lb 3.2 oz (83.6 kg)  01/30/20 183 lb (83 kg)   BMI Readings from Last 1 Encounters:  02/16/20 29.70 kg/m    *Unable to obtain current vital signs, weight, and BMI due to telephone visit type  Hearing/Vision  . July did not seem to have difficulty with hearing/understanding during the telephone conversation . Reports that she has had a formal eye exam by an eye care professional within the past year . Reports that she has not had a formal hearing evaluation within the past year *Unable to fully assess hearing and vision during telephone visit type  Cognitive Function: 6CIT Screen 02/20/2020 02/10/2019  What Year? 0 points 0 points  What month? 0 points 0 points  What time? 0 points 0 points  Count back from 20 0 points 0 points  Months in reverse 0 points 0 points  Repeat phrase 0 points 0 points  Total Score 0 0   (Normal:0-7, Significant for Dysfunction: >8)  Normal Cognitive Function Screening: Yes   Immunization & Health Maintenance Record Immunization History  Administered Date(s) Administered  . Influenza, High Dose Seasonal PF 05/22/2018, 04/03/2019, 04/03/2019  . Influenza,inj,Quad PF,6+ Mos 04/07/2013  . Influenza-Unspecified 03/20/2016, 04/30/2017  . Pneumococcal Conjugate-13 11/21/2014  .  Pneumococcal Polysaccharide-23 01/02/2011  . Td 04/06/2006  . Tdap 12/12/2019    Health  Maintenance  Topic Date Due  . COVID-19 Vaccine (1) 03/03/2020 (Originally 02/17/1958)  . INFLUENZA VACCINE  10/04/2020 (Originally 02/05/2020)  . TETANUS/TDAP  12/11/2029  . DEXA SCAN  Completed  . Hepatitis C Screening  Completed  . PNA vac Low Risk Adult  Completed       Assessment  This is a routine wellness examination for [employer].  Health Maintenance: Due or Overdue There are no preventive care reminders to display for this patient.  Abigail Moss does not need a referral for [employer] Assistance: Care Management:   no Social Work:    no Prescription Assistance:  no Nutrition/Diabetes Education:  no   Plan:  Personalized Goals Goals Addressed            This Visit's Progress   . DIET - INCREASE WATER INTAKE       Try to drink 6-8 glasses of water daily      Personalized Health Maintenance & Screening Recommendations  Influenza vaccine  Lung Cancer Screening Recommended: no (Low Dose CT Chest recommended if Age 90-80 years, 30 pack-year currently smoking OR have quit w/in past 15 years) Hepatitis C Screening recommended: no HIV Screening recommended: no  Advanced Directives: Written information was not prepared per patient's request.  Referrals & Orders No orders of the defined types were placed in this encounter.   Follow-up Plan . Follow-up with Junie Spencer, FNP as planned . Consider your Flu vaccine at your next visit with your PCP . Bring your COVID vaccine card in for our records   I have personally reviewed and noted the following in the patient's chart:   . Medical and social history . Use of alcohol, tobacco or illicit drugs  . Current medications and supplements . Functional ability and status . Nutritional status . Physical activity . Advanced directives . List of other physicians . Hospitalizations, surgeries, and ER visits in previous 12 months . Vitals . Screenings to include cognitive, depression, and falls . Referrals and  appointments  In addition, I have reviewed and discussed with Abigail Moss certain preventive protocols, quality metrics, and best practice recommendations. A written personalized care plan for preventive services as well as general preventive health recommendations is available and can be mailed to the patient at her request.      Hessie Diener, LPN  01/05/6377

## 2020-02-23 ENCOUNTER — Ambulatory Visit (INDEPENDENT_AMBULATORY_CARE_PROVIDER_SITE_OTHER): Payer: Medicare Other | Admitting: Family Medicine

## 2020-02-23 ENCOUNTER — Encounter: Payer: Self-pay | Admitting: Family Medicine

## 2020-02-23 DIAGNOSIS — J0100 Acute maxillary sinusitis, unspecified: Secondary | ICD-10-CM | POA: Diagnosis not present

## 2020-02-23 MED ORDER — AMOXICILLIN 500 MG PO CAPS: 500.0000 mg | ORAL_CAPSULE | Freq: Three times a day (TID) | ORAL | 0 refills | Status: DC

## 2020-02-23 NOTE — Progress Notes (Signed)
Subjective:    Patient ID: Abigail Moss, female    DOB: 1945-08-29, 74 y.o.   MRN: [id number]   HPI: Abigail Moss is a 74 y.o. female presenting for pain in gums for three days. Usually happens with her sinuses. Feels congestion. Denies fever, cough, ST. Some brief relief with oragel.    Depression screen [facility] 2/9 02/20/2020 02/07/2020 01/30/2020 12/28/2019 11/01/2019  Decreased Interest 0 0 0 0 0  Down, Depressed, Hopeless 0 0 0 0 0  PHQ - 2 Score 0 0 0 0 0  Altered sleeping - - - - -  Tired, decreased energy - - - - -  Change in appetite - - - - -  Feeling bad or failure about yourself  - - - - -  Trouble concentrating - - - - -  Moving slowly or fidgety/restless - - - - -  Suicidal thoughts - - - - -  PHQ-9 Score - - - - -  Some recent data might be hidden     Relevant past medical, surgical, family and social history reviewed and updated as indicated.  Interim medical history since our last visit reviewed. Allergies and medications reviewed and updated.  ROS:  Review of Systems  Constitutional: Negative for activity change and fever.  HENT: Positive for congestion, mouth sores and postnasal drip.   Respiratory: Negative for cough, chest tightness and shortness of breath.      Social History   Tobacco Use  Smoking Status Former Smoker  . Quit date: 07/07/1996  . Years since quitting: 23.6  Smokeless Tobacco Never Used       Objective:     Wt Readings from Last 3 Encounters:  02/16/20 184 lb (83.5 kg)  02/07/20 184 lb 3.2 oz (83.6 kg)  01/30/20 183 lb (83 kg)     Exam deferred. Pt. Harboring due to COVID 19. Phone visit performed.   Assessment & Plan:   1. Acute maxillary sinusitis, recurrence not specified     Meds ordered this encounter  Medications  . amoxicillin (AMOXIL) 500 MG capsule    Sig: Take 1 capsule (500 mg total) by mouth 3 (three) times daily.    Dispense:  30 capsule    Refill:  0    No orders of the defined types were placed in this  encounter.     Diagnoses and all orders for this visit:  Acute maxillary sinusitis, recurrence not specified  Other orders -     amoxicillin (AMOXIL) 500 MG capsule; Take 1 capsule (500 mg total) by mouth 3 (three) times daily.    Virtual Visit via telephone Note  I discussed the limitations, risks, security and privacy concerns of performing an evaluation and management service by telephone and the availability of in person appointments. The patient was identified with two identifiers. Pt.expressed understanding and agreed to proceed. Pt. Is at home. Dr. Darlyn Read is in his office.  Follow Up Instructions:   I discussed the assessment and treatment plan with the patient. The patient was provided an opportunity to ask questions and all were answered. The patient agreed with the plan and demonstrated an understanding of the instructions.   The patient was advised to call back or seek an in-person evaluation if the symptoms worsen or if the condition fails to improve as anticipated.   Total minutes including chart review and phone contact time: 7   Follow up plan: No follow-ups on file.  Mechele Claude, MD [facility]

## 2020-02-24 DIAGNOSIS — Z20822 Contact with and (suspected) exposure to covid-19: Secondary | ICD-10-CM | POA: Diagnosis not present

## 2020-02-29 ENCOUNTER — Ambulatory Visit
Admission: RE | Admit: 2020-02-29 | Discharge: 2020-02-29 | Disposition: A | Discharge status: Home / Self Care | Payer: Medicare Other | Source: Ambulatory Visit | Attending: Family | Admitting: Family

## 2020-02-29 ENCOUNTER — Other Ambulatory Visit: Payer: Self-pay

## 2020-02-29 DIAGNOSIS — Z1231 Encounter for screening mammogram for malignant neoplasm of breast: Secondary | ICD-10-CM

## 2020-03-08 ENCOUNTER — Other Ambulatory Visit: Payer: Self-pay

## 2020-03-08 ENCOUNTER — Ambulatory Visit (HOSPITAL_COMMUNITY)
Admission: RE | Admit: 2020-03-08 | Discharge: 2020-03-08 | Disposition: A | Discharge status: Home / Self Care | Payer: Medicare Other | Source: Ambulatory Visit | Attending: Family | Admitting: Family

## 2020-03-08 ENCOUNTER — Ambulatory Visit (INDEPENDENT_AMBULATORY_CARE_PROVIDER_SITE_OTHER): Payer: Medicare Other | Admitting: Family

## 2020-03-08 ENCOUNTER — Encounter: Payer: Self-pay | Admitting: Family

## 2020-03-08 VITALS — BP 153/92 | HR 89 | Temp 97.0°F | Ht 66.0 in | Wt 185.0 lb

## 2020-03-08 DIAGNOSIS — N3000 Acute cystitis without hematuria: Secondary | ICD-10-CM | POA: Diagnosis not present

## 2020-03-08 DIAGNOSIS — R300 Dysuria: Secondary | ICD-10-CM | POA: Diagnosis not present

## 2020-03-08 DIAGNOSIS — M79605 Pain in left leg: Secondary | ICD-10-CM | POA: Diagnosis not present

## 2020-03-08 DIAGNOSIS — M25552 Pain in left hip: Secondary | ICD-10-CM | POA: Diagnosis not present

## 2020-03-08 LAB — URINALYSIS, COMPLETE
Bilirubin, UA: NEGATIVE
Glucose, UA: NEGATIVE
Ketones, UA: NEGATIVE
Nitrite, UA: NEGATIVE
Protein,UA: NEGATIVE
Specific Gravity, UA: 1.01 (ref 1.005–1.030)
Urobilinogen, Ur: 0.2 mg/dL (ref 0.2–1.0)
pH, UA: 6.5 (ref 5.0–7.5)

## 2020-03-08 LAB — MICROSCOPIC EXAMINATION: WBC, UA: >30 /hpf — AB (ref 0–5)

## 2020-03-08 MED ORDER — CEPHALEXIN 500 MG PO CAPS: 500.0000 mg | ORAL_CAPSULE | Freq: Two times a day (BID) | ORAL | 0 refills | Status: DC

## 2020-03-08 NOTE — Patient Instructions (Signed)

## 2020-03-08 NOTE — Progress Notes (Signed)
Subjective:    Patient ID: Abigail Moss, female    DOB: 04-Sep-1945, 74 y.o.   MRN: [id number]  Chief Complaint  Patient presents with  . Urinary Tract Infection   Pt presents to the office today with UTI symptoms. She also reports left ankle, calf, and hip pain that started two days ago. She states the pain is the same sitting, standing, and sleeping and is a 8 out 10. Denies any injury.  Dysuria  This is a recurrent problem. The current episode started in the past 7 days. The problem occurs intermittently. The problem has been waxing and waning. The quality of the pain is described as burning. The pain is at a severity of 5/10. The pain is mild. Associated symptoms include flank pain, frequency, hesitancy and urgency. Pertinent negatives include no hematuria or nausea. She has tried increased fluids for the symptoms. The treatment provided mild relief.      Review of Systems  Gastrointestinal: Negative for nausea.  Genitourinary: Positive for dysuria, flank pain, frequency, hesitancy and urgency. Negative for hematuria.  All other systems reviewed and are negative.      Objective:   Physical Exam Vitals reviewed.  Constitutional:      General: She is not in acute distress.    Appearance: She is well-developed.  HENT:     Head: Normocephalic and atraumatic.  Eyes:     Pupils: Pupils are equal, round, and reactive to light.  Neck:     Thyroid: No thyromegaly.  Cardiovascular:     Rate and Rhythm: Normal rate and regular rhythm.     Heart sounds: Normal heart sounds. No murmur heard.   Pulmonary:     Effort: Pulmonary effort is normal. No respiratory distress.     Breath sounds: Normal breath sounds. No wheezing.  Abdominal:     General: Bowel sounds are normal. There is no distension.     Palpations: Abdomen is soft.     Tenderness: There is no abdominal tenderness.  Musculoskeletal:        General: No tenderness. Normal range of motion.     Cervical back: Normal range  of motion and neck supple.     Comments: Positive homan's sign, no erythemas or swelling noted  Skin:    General: Skin is warm and dry.  Neurological:     Mental Status: She is alert and oriented to person, place, and time.     Cranial Nerves: No cranial nerve deficit.     Deep Tendon Reflexes: Reflexes are normal and symmetric.  Psychiatric:        Behavior: Behavior normal.        Thought Content: Thought content normal.        Judgment: Judgment normal.     BP (!) 153/92   Pulse 89   Temp (!) 97 F (36.1 C) (Temporal)   Ht 5\' 6"  (1.676 m)   Wt 185 lb (83.9 kg)   SpO2 99%   BMI 29.86 kg/m      Assessment & Plan:  Abigail Moss comes in today with chief complaint of Urinary Tract Infection   Diagnosis and orders addressed:  1. Dysuria  - Urinalysis, Complete - Urine Culture  2. Pain of left lower extremity I do not think this his a DVT, but will rule out given calf pain and the intense pain.  - [name] Venous Img Lower Unilateral Left; Future  3. Acute cystitis without hematuria Force fluids AZO over the counter  X2 days RTO if symptoms worsen or do not improve  Culture pending - cephALEXin (KEFLEX) 500 MG capsule; Take 1 capsule (500 mg total) by mouth 2 (two) times daily.  Dispense: 14 capsule; Refill: 0  Jannifer Rodney, FNP

## 2020-03-09 ENCOUNTER — Telehealth: Payer: Self-pay | Admitting: Family

## 2020-03-09 MED ORDER — BACLOFEN 10 MG PO TABS: 5.0000 mg | ORAL_TABLET | Freq: Three times a day (TID) | ORAL | 0 refills | Status: DC

## 2020-03-09 NOTE — Telephone Encounter (Signed)
Continue Mobic and try baclofen as needed.

## 2020-03-09 NOTE — Telephone Encounter (Signed)
Pt aware of provider feedback and voiced understanding. 

## 2020-03-09 NOTE — Telephone Encounter (Signed)
Pt seen yesterday and wants to know if Neysa Bonito will send in something for leg pain. Use [facility].

## 2020-03-10 LAB — URINE CULTURE

## 2020-03-13 ENCOUNTER — Other Ambulatory Visit: Payer: Self-pay | Admitting: Family

## 2020-03-13 ENCOUNTER — Telehealth: Payer: Self-pay | Admitting: Family

## 2020-03-13 DIAGNOSIS — I10 Essential (primary) hypertension: Secondary | ICD-10-CM

## 2020-03-13 DIAGNOSIS — M19071 Primary osteoarthritis, right ankle and foot: Secondary | ICD-10-CM

## 2020-03-13 NOTE — Telephone Encounter (Signed)
Waiting on provider.

## 2020-03-13 NOTE — Telephone Encounter (Signed)
Pt states that the calf of her leg is still hurting badly, and she is having pain now running from her lower back down her leg. She states the doppler showed no blood clot. Pt is wanting to see if something else can be ordered to help with the pain.

## 2020-03-13 NOTE — Telephone Encounter (Signed)
Will have to wait for christy hawks.

## 2020-03-13 NOTE — Telephone Encounter (Signed)
Covering pcp please advise. 

## 2020-03-14 ENCOUNTER — Telehealth: Payer: Self-pay | Admitting: Family

## 2020-03-14 NOTE — Telephone Encounter (Signed)
If leg is hurtung that bad then need to go to the ER

## 2020-03-15 ENCOUNTER — Ambulatory Visit (INDEPENDENT_AMBULATORY_CARE_PROVIDER_SITE_OTHER): Payer: Medicare Other | Admitting: Family

## 2020-03-15 ENCOUNTER — Other Ambulatory Visit: Payer: Self-pay | Admitting: Family

## 2020-03-15 ENCOUNTER — Other Ambulatory Visit: Payer: Self-pay

## 2020-03-15 ENCOUNTER — Encounter: Payer: Self-pay | Admitting: Family

## 2020-03-15 VITALS — BP 126/85 | HR 87 | Temp 97.3°F | Ht 66.0 in | Wt 185.0 lb

## 2020-03-15 DIAGNOSIS — M5432 Sciatica, left side: Secondary | ICD-10-CM

## 2020-03-15 MED ORDER — KETOROLAC TROMETHAMINE 60 MG/2ML IM SOLN
60.0000 mg | Freq: Once | INTRAMUSCULAR | Status: AC
  Administered 2020-03-15: 60 mg via INTRAMUSCULAR

## 2020-03-15 MED ORDER — CIPROFLOXACIN HCL 500 MG PO TABS: 500.0000 mg | ORAL_TABLET | Freq: Two times a day (BID) | ORAL | 0 refills | Status: DC

## 2020-03-15 MED ORDER — METHYLPREDNISOLONE ACETATE 80 MG/ML IJ SUSP
80.0000 mg | Freq: Once | INTRAMUSCULAR | Status: AC
  Administered 2020-03-15: 80 mg via INTRAMUSCULAR

## 2020-03-15 MED ORDER — GABAPENTIN 100 MG PO CAPS: 100.0000 mg | ORAL_CAPSULE | Freq: Three times a day (TID) | ORAL | 3 refills | Status: DC

## 2020-03-15 NOTE — Telephone Encounter (Signed)
Pt seen today

## 2020-03-15 NOTE — Progress Notes (Signed)
   Subjective:    Patient ID: Abigail Moss, female    DOB: Aug 31, 1945, 74 y.o.   MRN: [id number]  Chief Complaint  Patient presents with  . Leg Pain    left leg pain x 1 week, pain has worsened. Doppler was negitive     HPI Pt presents to the office today with left calf and hip pain that started a week ago. She was seen in the office and we did a doppler that was negative for DVT. She reports her pain is a 10 out 10 that is worse in the morning and walking. She states she will have pain in her buttocks that will shot down into her leg.    Denies redness, warmth, or injury.    She has been taking Mobic and baclofen without relief.   Review of Systems  All other systems reviewed and are negative.      Objective:   Physical Exam Vitals reviewed.  Constitutional:      General: She is not in acute distress.    Appearance: She is well-developed.  HENT:     Head: Normocephalic and atraumatic.  Eyes:     Pupils: Pupils are equal, round, and reactive to light.  Neck:     Thyroid: No thyromegaly.  Cardiovascular:     Rate and Rhythm: Normal rate and regular rhythm.     Heart sounds: Normal heart sounds. No murmur heard.   Pulmonary:     Effort: Pulmonary effort is normal. No respiratory distress.     Breath sounds: Normal breath sounds. No wheezing.  Abdominal:     General: Bowel sounds are normal. There is no distension.     Palpations: Abdomen is soft.     Tenderness: There is no abdominal tenderness.  Musculoskeletal:        General: No tenderness.     Cervical back: Normal range of motion and neck supple.     Comments: Left buttocks pain that radiates to left calf, pain with standing  Skin:    General: Skin is warm and dry.  Neurological:     Mental Status: She is alert and oriented to person, place, and time.     Cranial Nerves: No cranial nerve deficit.     Deep Tendon Reflexes: Reflexes are normal and symmetric.  Psychiatric:        Behavior: Behavior normal.          Thought Content: Thought content normal.        Judgment: Judgment normal.          BP 126/85   Pulse 87   Temp (!) 97.3 F (36.3 C) (Temporal)   Ht 5\' 6"  (1.676 m)   Wt 185 lb (83.9 kg)   SpO2 95%   BMI 29.86 kg/m   Assessment & Plan:  Abigail Moss comes in today with chief complaint of Leg Pain (left leg pain x 1 week, pain has worsened. Doppler was negitive )   Diagnosis and orders addressed:  1. Sciatica of left side Rest ROM exercises  Continue Mobic and baclofen  Start Gabapentin 100 mg TID prn Sedation precautions discussed RTO if symptoms worsen or continue - methylPREDNISolone acetate (DEPO-MEDROL) injection 80 mg - ketorolac (TORADOL) injection 60 mg - gabapentin (NEURONTIN) 100 MG capsule; Take 1 capsule (100 mg total) by mouth 3 (three) times daily.  Dispense: 90 capsule; Refill: 3   [name], FNP

## 2020-03-15 NOTE — Patient Instructions (Signed)
Sciatica Rehab Ask your health care provider which exercises are safe for you. Do exercises exactly as told by your health care provider and adjust them as directed. It is normal to feel mild stretching, pulling, tightness, or discomfort as you do these exercises. Stop right away if you feel sudden pain or your pain gets worse. Do not begin these exercises until told by your health care provider. Stretching and range-of-motion exercises These exercises warm up your muscles and joints and improve the movement and flexibility of your hips and back. These exercises also help to relieve pain, numbness, and tingling. Sciatic nerve glide 1. Sit in a chair with your head facing down toward your chest. Place your hands behind your back. Let your shoulders slump forward. 2. Slowly straighten one of your legs while you tilt your head back as if you are looking toward the ceiling. Only straighten your leg as far as you can without making your symptoms worse. 3. Hold this position for __________ seconds. 4. Slowly return to the starting position. 5. Repeat with your other leg. Repeat __________ times. Complete this exercise __________ times a day. Knee to chest with hip adduction and internal rotation  1. Lie on your back on a firm surface with both legs straight. 2. Bend one of your knees and move it up toward your chest until you feel a gentle stretch in your lower back and buttock. Then, move your knee toward the shoulder that is on the opposite side from your leg. This is hip adduction and internal rotation. ? Hold your leg in this position by holding on to the front of your knee. 3. Hold this position for __________ seconds. 4. Slowly return to the starting position. 5. Repeat with your other leg. Repeat __________ times. Complete this exercise __________ times a day. Prone extension on elbows  1. Lie on your abdomen on a firm surface. A bed may be too soft for this exercise. 2. Prop yourself up on  your elbows. 3. Use your arms to help lift your chest up until you feel a gentle stretch in your abdomen and your lower back. ? This will place some of your body weight on your elbows. If this is uncomfortable, try stacking pillows under your chest. ? Your hips should stay down, against the surface that you are lying on. Keep your hip and back muscles relaxed. 4. Hold this position for __________ seconds. 5. Slowly relax your upper body and return to the starting position. Repeat __________ times. Complete this exercise __________ times a day. Strengthening exercises These exercises build strength and endurance in your back. Endurance is the ability to use your muscles for a long time, even after they get tired. Pelvic tilt This exercise strengthens the muscles that lie deep in the abdomen. 1. Lie on your back on a firm surface. Bend your knees and keep your feet flat on the floor. 2. Tense your abdominal muscles. Tip your pelvis up toward the ceiling and flatten your lower back into the floor. ? To help with this exercise, you may place a small towel under your lower back and try to push your back into the towel. 3. Hold this position for __________ seconds. 4. Let your muscles relax completely before you repeat this exercise. Repeat __________ times. Complete this exercise __________ times a day. Alternating arm and leg raises  1. Get on your hands and knees on a firm surface. If you are on a hard floor, you may want to use   padding, such as an exercise mat, to cushion your knees. 2. Line up your arms and legs. Your hands should be directly below your shoulders, and your knees should be directly below your hips. 3. Lift your left leg behind you. At the same time, raise your right arm and straighten it in front of you. ? Do not lift your leg higher than your hip. ? Do not lift your arm higher than your shoulder. ? Keep your abdominal and back muscles tight. ? Keep your hips facing the  ground. ? Do not arch your back. ? Keep your balance carefully, and do not hold your breath. 4. Hold this position for __________ seconds. 5. Slowly return to the starting position. 6. Repeat with your right leg and your left arm. Repeat __________ times. Complete this exercise __________ times a day. Posture and body mechanics Good posture and healthy body mechanics can help to relieve stress in your body's tissues and joints. Body mechanics refers to the movements and positions of your body while you do your daily activities. Posture is part of body mechanics. Good posture means:  Your spine is in its natural S-curve position (neutral).  Your shoulders are pulled back slightly.  Your head is not tipped forward. Follow these guidelines to improve your posture and body mechanics in your everyday activities. Standing   When standing, keep your spine neutral and your feet about hip width apart. Keep a slight bend in your knees. Your ears, shoulders, and hips should line up.  When you do a task in which you stand in one place for a long time, place one foot up on a stable object that is 2-4 inches (5-10 cm) high, such as a footstool. This helps keep your spine neutral. Sitting   When sitting, keep your spine neutral and keep your feet flat on the floor. Use a footrest, if necessary, and keep your thighs parallel to the floor. Avoid rounding your shoulders, and avoid tilting your head forward.  When working at a desk or a computer, keep your desk at a height where your hands are slightly lower than your elbows. Slide your chair under your desk so you are close enough to maintain good posture.  When working at a computer, place your monitor at a height where you are looking straight ahead and you do not have to tilt your head forward or downward to look at the screen. Resting  When lying down and resting, avoid positions that are most painful for you.  If you have pain with activities  such as sitting, bending, stooping, or squatting, lie in a position in which your body does not bend very much. For example, avoid curling up on your side with your arms and knees near your chest (fetal position).  If you have pain with activities such as standing for a long time or reaching with your arms, lie with your spine in a neutral position and bend your knees slightly. Try the following positions: ? Lying on your side with a pillow between your knees. ? Lying on your back with a pillow under your knees. Lifting   When lifting objects, keep your feet at least shoulder width apart and tighten your abdominal muscles.  Bend your knees and hips and keep your spine neutral. It is important to lift using the strength of your legs, not your back. Do not lock your knees straight out.  Always ask for help to lift heavy or awkward objects. This information is not   intended to replace advice given to you by your health care provider. Make sure you discuss any questions you have with your health care provider. Document Revised: 10/15/2018 Document Reviewed: 07/15/2018 Elsevier Patient Education  2020 [employer] Sciatica  Sciatica is pain, numbness, weakness, or tingling along the path of the sciatic nerve. The sciatic nerve starts in the lower back and runs down the back of each leg. The nerve controls the muscles in the lower leg and in the back of the knee. It also provides feeling (sensation) to the back of the thigh, the lower leg, and the sole of the foot. Sciatica is a symptom of another medical condition that pinches or puts pressure on the sciatic nerve. Sciatica most often only affects one side of the body. Sciatica usually goes away on its own or with treatment. In some cases, sciatica may come back (recur). What are the causes? This condition is caused by pressure on the sciatic nerve or pinching of the nerve. This may be the result of:  A disk in between the bones of the spine  bulging out too far (herniated disk).  Age-related changes in the spinal disks.  A pain disorder that affects a muscle in the buttock.  Extra bone growth near the sciatic nerve.  A break (fracture) of the pelvis.  Pregnancy.  Tumor. This is rare. What increases the risk? The following factors may make you more likely to develop this condition:  Playing sports that place pressure or stress on the spine.  Having poor strength and flexibility.  A history of back injury or surgery.  Sitting for long periods of time.  Doing activities that involve repetitive bending or lifting.  Obesity. What are the signs or symptoms? Symptoms can vary from mild to very severe, and they may include:  Any of these problems in the lower back, leg, hip, or buttock: ? Mild tingling, numbness, or dull aches. ? Burning sensations. ? Sharp pains.  Numbness in the back of the calf or the sole of the foot.  Leg weakness.  Severe back pain that makes movement difficult. Symptoms may get worse when you cough, sneeze, or laugh, or when you sit or stand for long periods of time. How is this diagnosed? This condition may be diagnosed based on:  Your symptoms and medical history.  A physical exam.  Blood tests.  Imaging tests, such as: ? X-rays. ? MRI. ? CT scan. How is this treated? In many cases, this condition improves on its own without treatment. However, treatment may include:  Reducing or modifying physical activity.  Exercising and stretching.  Icing and applying heat to the affected area.  Medicines that help to: ? Relieve pain and swelling. ? Relax your muscles.  Injections of medicines that help to relieve pain, irritation, and inflammation around the sciatic nerve (steroids).  Surgery. Follow these instructions at home: Medicines  Take over-the-counter and prescription medicines only as told by your health care provider.  Ask your health care provider if the  medicine prescribed to you: ? Requires you to avoid driving or using heavy machinery. ? Can cause constipation. You may need to take these actions to prevent or treat constipation:  Drink enough fluid to keep your urine pale yellow.  Take over-the-counter or prescription medicines.  Eat foods that are high in fiber, such as beans, whole grains, and fresh fruits and vegetables.  Limit foods that are high in fat and processed sugars, such as fried or sweet foods. Managing   pain      If directed, put ice on the affected area. ? Put ice in a plastic bag. ? Place a towel between your skin and the bag. ? Leave the ice on for 20 minutes, 2-3 times a day.  If directed, apply heat to the affected area. Use the heat source that your health care provider recommends, such as a moist heat pack or a heating pad. ? Place a towel between your skin and the heat source. ? Leave the heat on for 20-30 minutes. ? Remove the heat if your skin turns bright red. This is especially important if you are unable to feel pain, heat, or cold. You may have a greater risk of getting burned. Activity   Return to your normal activities as told by your health care provider. Ask your health care provider what activities are safe for you.  Avoid activities that make your symptoms worse.  Take brief periods of rest throughout the day. ? When you rest for longer periods, mix in some mild activity or stretching between periods of rest. This will help to prevent stiffness and pain. ? Avoid sitting for long periods of time without moving. Get up and move around at least one time each hour.  Exercise and stretch regularly, as told by your health care provider.  Do not lift anything that is heavier than 10 lb (4.5 kg) while you have symptoms of sciatica. When you do not have symptoms, you should still avoid heavy lifting, especially repetitive heavy lifting.  When you lift objects, always use proper lifting technique,  which includes: ? Bending your knees. ? Keeping the load close to your body. ? Avoiding twisting. General instructions  Maintain a healthy weight. Excess weight puts extra stress on your back.  Wear supportive, comfortable shoes. Avoid wearing high heels.  Avoid sleeping on a mattress that is too soft or too hard. A mattress that is firm enough to support your back when you sleep may help to reduce your pain.  Keep all follow-up visits as told by your health care provider. This is important. Contact a health care provider if:  You have pain that: ? Wakes you up when you are sleeping. ? Gets worse when you lie down. ? Is worse than you have experienced in the past. ? Lasts longer than 4 weeks.  You have an unexplained weight loss. Get help right away if:  You are not able to control when you urinate or have bowel movements (incontinence).  You have: ? Weakness in your lower back, pelvis, buttocks, or legs that gets worse. ? Redness or swelling of your back. ? A burning sensation when you urinate. Summary  Sciatica is pain, numbness, weakness, or tingling along the path of the sciatic nerve.  This condition is caused by pressure on the sciatic nerve or pinching of the nerve.  Sciatica can cause pain, numbness, or tingling in the lower back, legs, hips, and buttocks.  Treatment often includes rest, exercise, medicines, and applying ice or heat. This information is not intended to replace advice given to you by your health care provider. Make sure you discuss any questions you have with your health care provider. Document Revised: 07/12/2018 Document Reviewed: 07/12/2018 Elsevier Patient Education  2020 [employer]  

## 2020-03-15 NOTE — Telephone Encounter (Signed)
Patient would like you to review and advise

## 2020-03-19 ENCOUNTER — Ambulatory Visit (INDEPENDENT_AMBULATORY_CARE_PROVIDER_SITE_OTHER): Payer: Medicare Other | Admitting: Nurse Practitioner

## 2020-03-19 DIAGNOSIS — M5432 Sciatica, left side: Secondary | ICD-10-CM

## 2020-03-19 MED ORDER — PREDNISONE 10 MG (21) PO TBPK: ORAL_TABLET | ORAL | 0 refills | Status: DC

## 2020-03-19 NOTE — Progress Notes (Signed)
   Virtual Visit via telephone Note Due to COVID-19 pandemic this visit was conducted virtually. This visit type was conducted due to national recommendations for restrictions regarding the COVID-19 Pandemic (e.g. social distancing, sheltering in place) in an effort to limit this patient's exposure and mitigate transmission in our community. All issues noted in this document were discussed and addressed.  A physical exam was not performed with this format.  I connected with Abigail Moss on 03/19/20 at 9:05 by telephone and verified that I am speaking with the correct person using two identifiers. Abigail Moss is currently located at home and no one is currently with  her during visit. The provider, Mary-Margaret Daphine Deutscher, FNP is located in their office at time of visit.  I discussed the limitations, risks, security and privacy concerns of performing an evaluation and management service by telephone and the availability of in person appointments. I also discussed with the patient that there may be a patient responsible charge related to this service. The patient expressed understanding and agreed to proceed.   History and Present Illness:   Chief Complaint: Leg Pain   HPI Patient calls in c/o  Left leg pain- she has had doppler study which was negative for DVT. She saw C. [name] on Thursday last week and was dx with sciatica. She was given a steroid shot and muscle relaxor.  Her pain increased over the weekend. Denies any swelling.   Review of Systems  Respiratory: Negative.   Cardiovascular: Negative.   Musculoskeletal: Positive for myalgias (left leg).  Neurological: Negative.   Psychiatric/Behavioral: Negative.   All other systems reviewed and are negative.    Observations/Objective: Alert and oriented- answers all questions appropriately No distress     Assessment and Plan: Abigail Moss in today with chief complaint of Leg Pain   1. Sciatica of left side Moist  heat Rest Meds ordered this encounter  Medications  . predniSONE (STERAPRED UNI-PAK 21 TAB) 10 MG (21) TBPK tablet    Sig: As directed x 6 days    Dispense:  21 tablet    Refill:  0    Order Specific Question:   Supervising Provider    Answer:   Arville Care A [[id number]]         Follow Up Instructions: prn    I discussed the assessment and treatment plan with the patient. The patient was provided an opportunity to ask questions and all were answered. The patient agreed with the plan and demonstrated an understanding of the instructions.   The patient was advised to call back or seek an in-person evaluation if the symptoms worsen or if the condition fails to improve as anticipated.  The above assessment and management plan was discussed with the patient. The patient verbalized understanding of and has agreed to the management plan. Patient is aware to call the clinic if symptoms persist or worsen. Patient is aware when to return to the clinic for a follow-up visit. Patient educated on when it is appropriate to go to the emergency department.   Time call ended:  9:20  I provided 15 minutes of non-face-to-face time during this encounter.    Mary-Margaret Daphine Deutscher, FNP

## 2020-03-28 ENCOUNTER — Other Ambulatory Visit: Payer: Self-pay

## 2020-03-28 ENCOUNTER — Ambulatory Visit: Payer: Medicare Other

## 2020-03-28 ENCOUNTER — Ambulatory Visit (INDEPENDENT_AMBULATORY_CARE_PROVIDER_SITE_OTHER): Payer: Medicare Other | Admitting: Family

## 2020-03-28 ENCOUNTER — Encounter: Payer: Self-pay | Admitting: Family

## 2020-03-28 VITALS — BP 151/95 | HR 89 | Temp 96.9°F | Ht 66.0 in | Wt 185.0 lb

## 2020-03-28 DIAGNOSIS — Z23 Encounter for immunization: Secondary | ICD-10-CM | POA: Diagnosis not present

## 2020-03-28 DIAGNOSIS — M5432 Sciatica, left side: Secondary | ICD-10-CM

## 2020-03-28 MED ORDER — GABAPENTIN 300 MG PO CAPS: 300.0000 mg | ORAL_CAPSULE | Freq: Three times a day (TID) | ORAL | 3 refills | Status: DC

## 2020-03-28 MED ORDER — BACLOFEN 10 MG PO TABS: 5.0000 mg | ORAL_TABLET | Freq: Three times a day (TID) | ORAL | 0 refills | Status: DC

## 2020-03-28 MED ORDER — PREDNISONE 20 MG PO TABS: ORAL_TABLET | ORAL | 0 refills | Status: DC

## 2020-03-28 NOTE — Patient Instructions (Signed)

## 2020-03-28 NOTE — Progress Notes (Signed)
Subjective:    Patient ID: Abigail Moss, female    DOB: 08/20/1945, 74 y.o.   MRN: [id number]  Chief Complaint  Patient presents with  . Sciatica   Pt presents to the office today with recurrent sciatica pain. She has been given prednisone that "really helped". She takes Mobic 15 mg daily, baclofen 10 mg TID, and gabapentin 100 mg TID with mild relief.  Back Pain This is a new problem. The current episode started 1 to 4 weeks ago. The problem occurs intermittently. The problem has been gradually worsening since onset. The pain is present in the gluteal. The quality of the pain is described as aching. The pain radiates to the left thigh. The pain is at a severity of 10/10. The pain is moderate. The symptoms are aggravated by lying down and standing. Associated symptoms include leg pain and numbness. Risk factors include obesity and menopause.      Review of Systems  Musculoskeletal: Positive for back pain.  Neurological: Positive for numbness.  All other systems reviewed and are negative.      Objective:   Physical Exam Vitals reviewed.  Constitutional:      General: She is not in acute distress.    Appearance: She is well-developed.  HENT:     Head: Normocephalic and atraumatic.  Eyes:     Pupils: Pupils are equal, round, and reactive to light.  Neck:     Thyroid: No thyromegaly.  Cardiovascular:     Rate and Rhythm: Normal rate and regular rhythm.     Heart sounds: Normal heart sounds. No murmur heard.   Pulmonary:     Effort: Pulmonary effort is normal. No respiratory distress.     Breath sounds: Normal breath sounds. No wheezing.  Abdominal:     General: Bowel sounds are normal. There is no distension.     Palpations: Abdomen is soft.     Tenderness: There is no abdominal tenderness.  Musculoskeletal:        General: No tenderness.     Cervical back: Normal range of motion and neck supple.     Comments: Unable to perform straight raise related to pain  Skin:     General: Skin is warm and dry.  Neurological:     Mental Status: She is alert and oriented to person, place, and time.     Cranial Nerves: No cranial nerve deficit.     Deep Tendon Reflexes: Reflexes are normal and symmetric.  Psychiatric:        Behavior: Behavior normal.        Thought Content: Thought content normal.        Judgment: Judgment normal.          BP (!) 151/95   Pulse 89   Temp (!) 96.9 F (36.1 C) (Temporal)   Ht 5\' 6"  (1.676 m)   Wt 185 lb (83.9 kg)   SpO2 95%   BMI 29.86 kg/m    Assessment & Plan:  Abigail Moss comes in today with chief complaint of Sciatica   Diagnosis and orders addressed:  1. Need for immunization against influenza - Flu Vaccine QUAD High Dose(Fluad)  2. Sciatica of left side Rest Ice Referral to PT Will give prednisone, last time Will increase gabapentin to 300 mg TID from 100 mg  Continue Baclofen and Mobic - DG Lumbar Spine 2-3 Views; Future - baclofen (LIORESAL) 10 MG tablet; Take 0.5 tablets (5 mg total) by mouth 3 (three) times daily.  Dispense: 30 each; Refill: 0 - gabapentin (NEURONTIN) 300 MG capsule; Take 1 capsule (300 mg total) by mouth 3 (three) times daily.  Dispense: 90 capsule; Refill: 3 - predniSONE (DELTASONE) 20 MG tablet; Take 3 tabs daily for 1 week, then 2 tabs for 1 week, then 1 tab for one week  Dispense: 42 tablet; Refill: 0 - Ambulatory referral to Physical Therapy   Jannifer Rodney, FNP

## 2020-04-11 ENCOUNTER — Ambulatory Visit (INDEPENDENT_AMBULATORY_CARE_PROVIDER_SITE_OTHER): Payer: Medicare Other

## 2020-04-11 ENCOUNTER — Other Ambulatory Visit: Payer: Self-pay

## 2020-04-11 ENCOUNTER — Other Ambulatory Visit: Payer: Self-pay | Admitting: Family

## 2020-04-11 DIAGNOSIS — M5136 Other intervertebral disc degeneration, lumbar region: Secondary | ICD-10-CM | POA: Diagnosis not present

## 2020-04-11 DIAGNOSIS — M8589 Other specified disorders of bone density and structure, multiple sites: Secondary | ICD-10-CM | POA: Diagnosis not present

## 2020-04-11 DIAGNOSIS — M5432 Sciatica, left side: Secondary | ICD-10-CM

## 2020-04-11 DIAGNOSIS — M810 Age-related osteoporosis without current pathological fracture: Secondary | ICD-10-CM | POA: Diagnosis not present

## 2020-04-12 ENCOUNTER — Other Ambulatory Visit: Payer: Self-pay | Admitting: Family

## 2020-04-12 DIAGNOSIS — M5432 Sciatica, left side: Secondary | ICD-10-CM

## 2020-04-13 ENCOUNTER — Other Ambulatory Visit: Payer: Self-pay | Admitting: Family

## 2020-04-13 ENCOUNTER — Telehealth: Payer: Self-pay | Admitting: Family Medicine

## 2020-04-13 DIAGNOSIS — G959 Disease of spinal cord, unspecified: Secondary | ICD-10-CM

## 2020-04-13 NOTE — Telephone Encounter (Signed)
See result note.  

## 2020-04-16 ENCOUNTER — Other Ambulatory Visit: Payer: Self-pay | Admitting: Family

## 2020-04-16 ENCOUNTER — Telehealth: Payer: Self-pay

## 2020-04-16 DIAGNOSIS — M5430 Sciatica, unspecified side: Secondary | ICD-10-CM

## 2020-04-16 MED ORDER — TRAMADOL HCL 50 MG PO TABS: 50.0000 mg | ORAL_TABLET | Freq: Two times a day (BID) | ORAL | 0 refills | Status: DC | PRN

## 2020-04-16 NOTE — Telephone Encounter (Signed)
Patient is not asking for a refill she has appointment with Pt 04/18/20. States she will not be able to go if she does not get anything for pain.

## 2020-04-16 NOTE — Telephone Encounter (Signed)
  Prescription Request  04/16/2020  What is the name of the medication or equipment? Pt is in a lot of pain from sciatica. She has televisit on She wants to know if Neysa Bonito will call in enough until appt'  Have you contacted your pharmacy to request a refill? (if applicable) no  Which pharmacy would you like this sent to? [facility]   Patient notified that their request is being sent to the clinical staff for review and that they should receive a response within 2 business days.

## 2020-04-16 NOTE — Telephone Encounter (Signed)
Patient aware and verbalizes understanding. 

## 2020-04-16 NOTE — Telephone Encounter (Signed)
Ultram Prescription sent to pharmacy, keep follow up and MRI appt

## 2020-04-17 ENCOUNTER — Other Ambulatory Visit: Payer: Self-pay

## 2020-04-17 ENCOUNTER — Ambulatory Visit (INDEPENDENT_AMBULATORY_CARE_PROVIDER_SITE_OTHER): Payer: Medicare Other | Admitting: Family Medicine

## 2020-04-17 ENCOUNTER — Encounter: Payer: Self-pay | Admitting: Family Medicine

## 2020-04-17 DIAGNOSIS — R609 Edema, unspecified: Secondary | ICD-10-CM

## 2020-04-17 NOTE — Progress Notes (Signed)
Virtual Visit via telephone Note  I connected with Abigail Moss on 04/17/20 at 1729 by telephone and verified that I am speaking with the correct person using two identifiers. Abigail Moss is currently located at home and patient are currently with her during visit. The provider, Fransisca Kaufmann Keren Alverio, MD is located in their office at time of visit.  Call ended at 1536  I discussed the limitations, risks, security and privacy concerns of performing an evaluation and management service by telephone and the availability of in person appointments. I also discussed with the patient that there may be a patient responsible charge related to this service. The patient expressed understanding and agreed to proceed.   History and Present Illness: Patient is calling in for legs and feet swelling. It has been swelling in both legs and feet but left is worse. She has this for the past few days.  She denies swelling before.  She is keeping feet up and using ice pack. She denies trouble breathing.    1. Peripheral edema     Outpatient Encounter Medications as of 04/17/2020  Medication Sig  . acetaminophen (TYLENOL) 500 MG tablet Take 1 tablet (500 mg total) by mouth every 6 (six) hours as needed.  Marland Kitchen acetic acid-hydrocortisone (VOSOL-HC) OTIC solution Place 4 drops into both ears 4 (four) times daily.  Marland Kitchen albuterol (VENTOLIN HFA) 108 (90 Base) MCG/ACT inhaler TAKE 2 PUFFS BY MOUTH EVERY 6 HOURS AS NEEDED FOR WHEEZE OR SHORTNESS OF BREATH  . alendronate (FOSAMAX) 70 MG tablet TAKE 1 TABLET BY MOUTH  WEEKLY WITH 8 OUNCE OF  PLAIN WATER 1/2 HOUR BEFORE FIRST FOOD DRINK OR MEDS.  STAY UPRIGHT FOR 1/2 HOUR  . ALPRAZolam (XANAX) 0.5 MG tablet Take 1 tablet (0.5 mg total) by mouth at bedtime as needed.  Marland Kitchen aspirin EC 81 MG tablet Take 81 mg by mouth daily.  . baclofen (LIORESAL) 10 MG tablet TAKE 1/2 TABLET (5 MG TOTAL) BY MOUTH 3 (THREE) TIMES DAILY.  Marland Kitchen buPROPion (WELLBUTRIN XL) 150 MG 24 hr tablet Take 1 tablet  (150 mg total) by mouth daily.  . Calcium Citrate-Vitamin D (CALCIUM CITRATE + PO) Take 600 mg by mouth daily.  . Cholecalciferol (VITAMIN D-3 PO) Take 800 mg by mouth daily.  . fluticasone (FLONASE) 50 MCG/ACT nasal spray SPRAY 2 SPRAYS INTO EACH NOSTRIL EVERY DAY  . gabapentin (NEURONTIN) 300 MG capsule Take 1 capsule (300 mg total) by mouth 3 (three) times daily.  Marland Kitchen guaiFENesin (MUCINEX) 600 MG 12 hr tablet Take 1 tablet (600 mg total) by mouth 2 (two) times daily.  Marland Kitchen levothyroxine (SYNTHROID) 150 MCG tablet Take 1 tablet (150 mcg total) by mouth daily.  Marland Kitchen losartan (COZAAR) 100 MG tablet TAKE 1 TABLET BY MOUTH  DAILY  . meloxicam (MOBIC) 15 MG tablet TAKE 1 TABLET BY MOUTH  DAILY  . montelukast (SINGULAIR) 10 MG tablet TAKE 1 TABLET BY MOUTH  DAILY AT BEDTIME  . MULTIPLE VITAMIN PO Take by mouth.  Marland Kitchen omeprazole (PRILOSEC) 40 MG capsule TAKE 1 CAPSULE BY MOUTH  DAILY  . sodium chloride (OCEAN) 0.65 % SOLN nasal spray Place 1 spray into both nostrils as needed for congestion.  . traMADol (ULTRAM) 50 MG tablet Take 1-2 tablets (50-100 mg total) by mouth every 12 (twelve) hours as needed.   No facility-administered encounter medications on file as of 04/17/2020.    Review of Systems  Constitutional: Negative for chills, fatigue and fever.  HENT: Negative for congestion.  Eyes: Negative for visual disturbance.  Respiratory: Negative for cough, chest tightness and shortness of breath.   Cardiovascular: Positive for leg swelling. Negative for chest pain.  Genitourinary: Negative for difficulty urinating, dysuria, pelvic pain and urgency.  Musculoskeletal: Negative for back pain and gait problem.  Skin: Negative for rash.  Neurological: Negative for light-headedness and headaches.  Psychiatric/Behavioral: Negative for agitation and behavioral problems.  All other systems reviewed and are negative.   Observations/Objective: Patient sounds comfortable and in no acute distress  Assessment  and Plan: Problem List Items Addressed This Visit    None    Visit Diagnoses    Peripheral edema    -  Primary   Relevant Orders   CMP14+EGFR   CBC with Differential/Platelet   TSH      Patient will come in and do some labs and see why she is also been having the swelling just over the past few days.  We will see if she has any kind of renal function issues. Follow up plan: Return if symptoms worsen or fail to improve.     I discussed the assessment and treatment plan with the patient. The patient was provided an opportunity to ask questions and all were answered. The patient agreed with the plan and demonstrated an understanding of the instructions.   The patient was advised to call back or seek an in-person evaluation if the symptoms worsen or if the condition fails to improve as anticipated.  The above assessment and management plan was discussed with the patient. The patient verbalized understanding of and has agreed to the management plan. Patient is aware to call the clinic if symptoms persist or worsen. Patient is aware when to return to the clinic for a follow-up visit. Patient educated on when it is appropriate to go to the emergency department.    I provided 7 minutes of non-face-to-face time during this encounter.    Worthy Rancher, MD

## 2020-04-18 ENCOUNTER — Other Ambulatory Visit: Payer: Medicare Other

## 2020-04-18 ENCOUNTER — Other Ambulatory Visit: Payer: Self-pay

## 2020-04-18 ENCOUNTER — Ambulatory Visit: Payer: Medicare Other | Admitting: Physical Therapy

## 2020-04-18 DIAGNOSIS — R609 Edema, unspecified: Secondary | ICD-10-CM

## 2020-04-18 LAB — CBC WITH DIFFERENTIAL/PLATELET

## 2020-04-19 ENCOUNTER — Ambulatory Visit: Payer: Medicare Other | Admitting: Family

## 2020-04-19 ENCOUNTER — Ambulatory Visit (INDEPENDENT_AMBULATORY_CARE_PROVIDER_SITE_OTHER): Payer: Medicare Other | Admitting: Family Medicine

## 2020-04-19 DIAGNOSIS — M5136 Other intervertebral disc degeneration, lumbar region: Secondary | ICD-10-CM

## 2020-04-19 DIAGNOSIS — R600 Localized edema: Secondary | ICD-10-CM

## 2020-04-19 LAB — CBC WITH DIFFERENTIAL/PLATELET
Basophils Absolute: 0 10*3/uL (ref 0.0–0.2)
Basos: 1 %
EOS (ABSOLUTE): 0.1 10*3/uL (ref 0.0–0.4)
Eos: 3 %
Hematocrit: 36.4 % (ref 34.0–46.6)
Hemoglobin: 12 g/dL (ref 11.1–15.9)
Immature Grans (Abs): 0 10*3/uL (ref 0.0–0.1)
Immature Granulocytes: 0 %
Lymphocytes Absolute: 1.3 10*3/uL (ref 0.7–3.1)
Lymphs: 28 %
MCH: 31.3 pg (ref 26.6–33.0)
MCHC: 33 g/dL (ref 31.5–35.7)
MCV: 95 fL (ref 79–97)
Monocytes Absolute: 0.5 10*3/uL (ref 0.1–0.9)
Monocytes: 11 %
Neutrophils Absolute: 2.8 10*3/uL (ref 1.4–7.0)
Neutrophils: 57 %
Platelets: 222 10*3/uL (ref 150–450)
RBC: 3.84 x10E6/uL (ref 3.77–5.28)
RDW: 12.8 % (ref 11.7–15.4)
WBC: 4.8 10*3/uL (ref 3.4–10.8)

## 2020-04-19 LAB — CMP14+EGFR
ALT: 38 IU/L — ABNORMAL HIGH (ref 0–32)
AST: 27 IU/L (ref 0–40)
Albumin/Globulin Ratio: 1.8 (ref 1.2–2.2)
Albumin: 3.9 g/dL (ref 3.7–4.7)
Alkaline Phosphatase: 61 IU/L (ref 44–121)
BUN/Creatinine Ratio: 15 (ref 12–28)
BUN: 12 mg/dL (ref 8–27)
Bilirubin Total: 0.4 mg/dL (ref 0.0–1.2)
CO2: 25 mmol/L (ref 20–29)
Calcium: 9 mg/dL (ref 8.7–10.3)
Chloride: 102 mmol/L (ref 96–106)
Creatinine, Ser: 0.81 mg/dL (ref 0.57–1.00)
GFR calc Af Amer: 82 mL/min/{1.73_m2} (ref >59)
GFR calc non Af Amer: 71 mL/min/{1.73_m2} (ref >59)
Globulin, Total: 2.2 g/dL (ref 1.5–4.5)
Glucose: 78 mg/dL (ref 65–99)
Potassium: 3.8 mmol/L (ref 3.5–5.2)
Sodium: 141 mmol/L (ref 134–144)
Total Protein: 6.1 g/dL (ref 6.0–8.5)

## 2020-04-19 LAB — TSH: TSH: 0.431 u[IU]/mL — ABNORMAL LOW (ref 0.450–4.500)

## 2020-04-19 NOTE — Progress Notes (Signed)
Telephone visit  Subjective: CC: sciata PCP: Junie Spencer, FNP DTO:IZTI Abigail Moss is a 74 y.o. female calls for telephone consult today. Patient provides verbal consent for consult held via phone.  Due to COVID-19 pandemic this visit was conducted virtually. This visit type was conducted due to national recommendations for restrictions regarding the COVID-19 Pandemic (e.g. social distancing, sheltering in place) in an effort to limit this patient's exposure and mitigate transmission in our community. All issues noted in this document were discussed and addressed.  A physical exam was not performed with this format.   Location of patient: home Location of provider: [facility] Others present for call: none  1. Sciatica Ongoing severe back pain that waxes and wanes in severity.  She is had difficulty ambulating at times.  She is treated with multiple medications including gabapentin, NSAIDs, prednisone, tramadol and baclofen.  She is had some lower extremity edema and therefore discontinued the gabapentin and meloxicam recently.  She has been trying to keep the legs elevated.  She had labs done yesterday and would like me to review those with her.  She has an appoint with orthopedist tomorrow locally.  MRI has not yet been done   ROS: Per HPI  Allergies  Allergen Reactions   Neomycin     REACTION: itching   Sulfur     REACTION: itching   Latex Rash   Past Medical History:  Diagnosis Date   GERD (gastroesophageal reflux disease)    Hypertension    Osteopenia    Thyroid disease     Current Outpatient Medications:    acetaminophen (TYLENOL) 500 MG tablet, Take 1 tablet (500 mg total) by mouth every 6 (six) hours as needed., Disp: 30 tablet, Rfl: 0   acetic acid-hydrocortisone (VOSOL-HC) OTIC solution, Place 4 drops into both ears 4 (four) times daily., Disp: 10 mL, Rfl: 2   albuterol (VENTOLIN HFA) 108 (90 Base) MCG/ACT inhaler, TAKE 2 PUFFS BY MOUTH EVERY 6 HOURS AS NEEDED FOR  WHEEZE OR SHORTNESS OF BREATH, Disp: 18 g, Rfl: 0   alendronate (FOSAMAX) 70 MG tablet, TAKE 1 TABLET BY MOUTH  WEEKLY WITH 8 OUNCE OF  PLAIN WATER 1/2 HOUR BEFORE FIRST FOOD DRINK OR MEDS.  STAY UPRIGHT FOR 1/2 HOUR, Disp: 12 tablet, Rfl: 3   ALPRAZolam (XANAX) 0.5 MG tablet, Take 1 tablet (0.5 mg total) by mouth at bedtime as needed., Disp: 30 tablet, Rfl: 3   aspirin EC 81 MG tablet, Take 81 mg by mouth daily., Disp: , Rfl:    baclofen (LIORESAL) 10 MG tablet, TAKE 1/2 TABLET (5 MG TOTAL) BY MOUTH 3 (THREE) TIMES DAILY., Disp: 90 tablet, Rfl: 3   buPROPion (WELLBUTRIN XL) 150 MG 24 hr tablet, Take 1 tablet (150 mg total) by mouth daily., Disp: 90 tablet, Rfl: 2   Calcium Citrate-Vitamin D (CALCIUM CITRATE + PO), Take 600 mg by mouth daily., Disp: , Rfl:    Cholecalciferol (VITAMIN D-3 PO), Take 800 mg by mouth daily., Disp: , Rfl:    fluticasone (FLONASE) 50 MCG/ACT nasal spray, SPRAY 2 SPRAYS INTO EACH NOSTRIL EVERY DAY, Disp: 48 mL, Rfl: 1   gabapentin (NEURONTIN) 300 MG capsule, Take 1 capsule (300 mg total) by mouth 3 (three) times daily., Disp: 90 capsule, Rfl: 3   guaiFENesin (MUCINEX) 600 MG 12 hr tablet, Take 1 tablet (600 mg total) by mouth 2 (two) times daily., Disp: 28 tablet, Rfl: 0   levothyroxine (SYNTHROID) 150 MCG tablet, Take 1 tablet (150 mcg total)  by mouth daily., Disp: 30 tablet, Rfl: 11   losartan (COZAAR) 100 MG tablet, TAKE 1 TABLET BY MOUTH  DAILY, Disp: 90 tablet, Rfl: 0   meloxicam (MOBIC) 15 MG tablet, TAKE 1 TABLET BY MOUTH  DAILY, Disp: 90 tablet, Rfl: 0   montelukast (SINGULAIR) 10 MG tablet, TAKE 1 TABLET BY MOUTH  DAILY AT BEDTIME, Disp: 90 tablet, Rfl: 3   MULTIPLE VITAMIN PO, Take by mouth., Disp: , Rfl:    omeprazole (PRILOSEC) 40 MG capsule, TAKE 1 CAPSULE BY MOUTH  DAILY, Disp: 90 capsule, Rfl: 3   sodium chloride (OCEAN) 0.65 % SOLN nasal spray, Place 1 spray into both nostrils as needed for congestion., Disp: 60 mL, Rfl: 2   traMADol  (ULTRAM) 50 MG tablet, Take 1-2 tablets (50-100 mg total) by mouth every 12 (twelve) hours as needed., Disp: 60 tablet, Rfl: 0  Assessment/ Plan: 74 y.o. female   DDD (degenerative disc disease), lumbar  Bilateral leg edema  Retrolisthesis noted at the L2 on L3 with disc space narrowing at the L3-L5 on recent x-rays.  She really has been given the full outpatient treatment with steroids, muscle relaxers, NSAIDs and gabapentin.  She was also recently prescribed tramadol.  I totally agree with referral and MRI.  She likely will have an appointment with orthopedist tomorrow.  I have printed out her x-ray result as well as the imaging burned onto a CD so that she may bring this to tomorrow's appointment.  I have recommended compression hose for her legs, continued elevation.  I reviewed her lab results with her today.  We discussed that both gabapentin and meloxicam can increase edema and that she should use these only as needed as directed  Start time: 1:48pm End time: 1:54pm  Total time spent on patient care (including telephone call/ virtual visit): 10 minutes  Anastassia Noack Hulen Skains, DO [facility] [phone]

## 2020-04-20 DIAGNOSIS — M5136 Other intervertebral disc degeneration, lumbar region: Secondary | ICD-10-CM | POA: Diagnosis not present

## 2020-04-20 DIAGNOSIS — M51369 Other intervertebral disc degeneration, lumbar region without mention of lumbar back pain or lower extremity pain: Secondary | ICD-10-CM | POA: Insufficient documentation

## 2020-04-23 ENCOUNTER — Telehealth: Payer: Self-pay

## 2020-04-25 NOTE — Telephone Encounter (Signed)
Please make a follow-up appointment for her to discuss options for replacement with PCP

## 2020-04-25 NOTE — Telephone Encounter (Signed)
I do not see it being a problem with any of her medications I do think it is just circulation in her age.  If she would like to wear compression stockings that could help significantly.

## 2020-04-25 NOTE — Telephone Encounter (Signed)
Patient aware and verbalized understanding. °

## 2020-04-25 NOTE — Telephone Encounter (Signed)
gabapentin (NEURONTIN) 300 MG capsule Patient states she stopped this because she looked it up and it said it could cause swelling. Since stopped swelling has gone down. What can she replace it with

## 2020-04-26 ENCOUNTER — Ambulatory Visit (HOSPITAL_COMMUNITY): Payer: Medicare Other

## 2020-04-27 ENCOUNTER — Other Ambulatory Visit: Payer: Self-pay | Admitting: *Deleted

## 2020-04-27 DIAGNOSIS — M5432 Sciatica, left side: Secondary | ICD-10-CM

## 2020-04-27 MED ORDER — BACLOFEN 10 MG PO TABS: ORAL_TABLET | ORAL | 1 refills | Status: DC

## 2020-04-30 ENCOUNTER — Ambulatory Visit: Payer: Medicare Other | Attending: Family | Admitting: Physical Therapy

## 2020-04-30 ENCOUNTER — Other Ambulatory Visit: Payer: Self-pay

## 2020-04-30 DIAGNOSIS — M5442 Lumbago with sciatica, left side: Secondary | ICD-10-CM | POA: Insufficient documentation

## 2020-04-30 DIAGNOSIS — R293 Abnormal posture: Secondary | ICD-10-CM | POA: Diagnosis not present

## 2020-04-30 NOTE — Therapy (Signed)
[facility] [address] [place], [place], [address] Phone: [phone]   Fax:  [phone]  Physical Therapy Evaluation  Patient Details  Name: Abigail Moss MRN: [id number] Date of Birth: 1946-03-15 Referring Provider (PT): Jannifer Rodney   Encounter Date: 04/30/2020   PT End of Session - 04/30/20 1121    Visit Number 1    Number of Visits 12    Date for PT Re-Evaluation 06/11/20    Authorization Type FOTO.    PT Start Time 1030    PT Stop Time 1112    PT Time Calculation (min) 42 min    Activity Tolerance Patient tolerated treatment well    Behavior During Therapy WFL for tasks assessed/performed           Past Medical History:  Diagnosis Date  . GERD (gastroesophageal reflux disease)   . Hypertension   . Osteopenia   . Thyroid disease     Past Surgical History:  Procedure Laterality Date  . EAR TUBE REMOVAL    . ROTATOR CUFF REPAIR Left   . TONSILLECTOMY    . VEIN LIGATION AND STRIPPING      There were no vitals filed for this visit.    Subjective Assessment - 04/30/20 1053    Subjective COVID-19 screen performed prior to patient entering clinic.  The patient presents to the clinic today with c/o left LE pain due to Sciatica.  Her CC today is in the region of her left calf.  Her lowback pain came on for no apparent reason.  Pain meds decrease her pain and bending and lifting increases her pain.  Her pain is rated at an 8/10 today.  There are times the pain gets so bad she has to use a walker.    Pertinent History Left RTC repair, HTN, osteopenia.    How long can you sit comfortably? Varies.    How long can you stand comfortably? Short time when pain is high.    How long can you walk comfortably? Short community distances.    Diagnostic tests X-ray.    Patient Stated Goals Get out of pain.    Currently in Pain? Yes    Pain Score 8     Pain Location Back   Left LE.   Pain Orientation Left    Pain Descriptors /  Indicators Throbbing;Numbness;Shooting    Pain Type Acute pain    Pain Onset More than a month ago    Pain Frequency Constant    Aggravating Factors  See above.    Pain Relieving Factors See above.              [facility] PT Assessment - 04/30/20 0001      Assessment   Medical Diagnosis Sciatica of left side.    Referring Provider (PT) Jannifer Rodney    Onset Date/Surgical Date --   Sept 2021.     Precautions   Precaution Comments Osteopenia.      Restrictions   Weight Bearing Restrictions No      Balance Screen   Has the patient fallen in the past 6 months No    Has the patient had a decrease in activity level because of a fear of falling?  Yes    Is the patient reluctant to leave their home because of a fear of falling?  No      Home Environment   Living Environment Private residence      Prior Function   Level of  Independence Independent      Posture/Postural Control   Posture/Postural Control Postural limitations    Postural Limitations Rounded Shoulders;Forward head;Increased thoracic kyphosis;Flexed trunk    Posture Comments Right shoulder depression.  Genu valgum.      Deep Tendon Reflexes   DTR Assessment Site Patella;Achilles    Patella DTR 0    Achilles DTR 0      ROM / Strength   AROM / PROM / Strength AROM;Strength      AROM   Overall AROM Comments Functional LE and spinal movement.      Strength   Overall Strength Comments Essentially normal bilateral LE strength.      Palpation   Palpation comment CC of pain is referred pain to left calf today.      Special Tests   Other special tests (-) left Slump test.      Ambulation/Gait   Gait Comments The patient walks in trunk flexion due to pain.                      Objective measurements completed on examination: See above findings.       [facility] Adult PT Treatment/Exercise - 04/30/20 0001      Modalities   Modalities Electrical Stimulation;Moist Heat      Moist Heat Therapy    Number Minutes Moist Heat 15 Minutes    Moist Heat Location Lumbar Spine      Electrical Stimulation   Electrical Stimulation Location Left low back.    Electrical Stimulation Action Pre-mod.    Electrical Stimulation Parameters 80-150 Hz x 15 minutes.    Electrical Stimulation Goals Tone;Pain                       PT Long Term Goals - 04/30/20 1122      PT LONG TERM GOAL #1   Title Independent with a HEP.    Time 6    Period Weeks    Status New      PT LONG TERM GOAL #2   Title Perform ADL's with pain not > 3/10.    Time 6    Period Weeks    Status New      PT LONG TERM GOAL #3   Title Eliminate referred pain.    Time 6    Period Weeks    Status New                  Plan - 04/30/20 1106    Clinical Impression Statement The patient presenst to [facility] with c/o low back paian and referred pain into her left calf since September.  At time her pain can become severe and she needs to use a walker.  The pain has impaired her functional mobility.  She has multiple postural abnormalities.  She walks in some trunk flexion.  Patient will benefit from skilled physical therapy intervention to address deficits and pain.    Personal Factors and Comorbidities Comorbidity 1;Comorbidity 2    Comorbidities Left RTC repair, HTN, osteopenia.    Examination-Activity Limitations Other;Locomotion Level    Stability/Clinical Decision Making Evolving/Moderate complexity    Clinical Decision Making Low    Rehab Potential Good    PT Frequency 2x / week    PT Duration 6 weeks    PT Treatment/Interventions ADLs/Self Care Home Management;Cryotherapy;Electrical Stimulation;Ultrasound;Moist Heat;Therapeutic activities;Therapeutic exercise;Manual techniques;Patient/family education;Passive range of motion;Dry needling    PT Next Visit Plan Core exercise progression.  Modalties and STW/M as needed.    Consulted and Agree with Plan of Care Patient           Patient will benefit  from skilled therapeutic intervention in order to improve the following deficits and impairments:  Pain, Decreased activity tolerance, Postural dysfunction  Visit Diagnosis: Acute left-sided low back pain with left-sided sciatica - Plan: PT plan of care cert/re-cert  Abnormal posture - Plan: PT plan of care cert/re-cert     Problem List Patient Active Problem List   Diagnosis Date Noted  . Acute recurrent frontal sinusitis 11/24/2019  . Benzodiazepine dependence (HCC) 07/15/2018  . Controlled substance agreement signed 07/15/2018  . GAD (generalized anxiety disorder) 12/18/2015  . Obesity (BMI 30-39.9) 12/18/2015  . Hypothyroidism 12/11/2014  . Allergic rhinitis 12/11/2014  . Vitamin D deficiency 12/11/2014  . Osteoporosis 12/11/2014  . HTN (hypertension) 12/08/2012  . GERD (gastroesophageal reflux disease) 12/08/2012    Jarome Trull, [place] MPT 04/30/2020, 11:24 AM  [facility] [address] [place], [place], [address] Phone: [phone]   Fax:  [phone]  Name: Abigail Moss MRN: [id number] Date of Birth: 1946/01/15

## 2020-05-01 ENCOUNTER — Other Ambulatory Visit: Payer: Medicare Other

## 2020-05-01 ENCOUNTER — Encounter: Payer: Self-pay | Admitting: Family

## 2020-05-01 ENCOUNTER — Ambulatory Visit (INDEPENDENT_AMBULATORY_CARE_PROVIDER_SITE_OTHER): Payer: Medicare Other | Admitting: Family

## 2020-05-01 DIAGNOSIS — E039 Hypothyroidism, unspecified: Secondary | ICD-10-CM

## 2020-05-01 DIAGNOSIS — M5430 Sciatica, unspecified side: Secondary | ICD-10-CM

## 2020-05-01 DIAGNOSIS — R609 Edema, unspecified: Secondary | ICD-10-CM

## 2020-05-01 MED ORDER — LEVOTHYROXINE SODIUM 125 MCG PO TABS: 125.0000 ug | ORAL_TABLET | Freq: Every day | ORAL | 3 refills | Status: DC

## 2020-05-01 NOTE — Progress Notes (Signed)
Virtual Visit via telephone Note Due to COVID-19 pandemic this visit was conducted virtually. This visit type was conducted due to national recommendations for restrictions regarding the COVID-19 Pandemic (e.g. social distancing, sheltering in place) in an effort to limit this patient's exposure and mitigate transmission in our community. All issues noted in this document were discussed and addressed.  A physical exam was not performed with this format.  I connected with Abigail Moss on 05/01/20 at 11:31 AM  by telephone and verified that I am speaking with the correct person using two identifiers. Abigail Moss is currently located at home and no one is currently with her during visit. The provider, Jannifer Rodney, FNP is located in their office at time of visit.  I discussed the limitations, risks, security and privacy concerns of performing an evaluation and management service by telephone and the availability of in person appointments. I also discussed with the patient that there may be a patient responsible charge related to this service. The patient expressed understanding and agreed to proceed.   History and Present Illness:  Pt calls the office today for thyroid follow up. She had her TSH redrawn on 04/18/20 and found to be abnormal and her levothyroxine was decreased to 125 mcg from 150 mcg. She states she did not start this medication because she thought her thyroid was "low" and needed more medication.   She continues to have left leg pain. She is working with PT and has a pending referral to [employer].   She also continues to have intermittent peripheral edema. She stopped her gabapentin because she thought this was causing it. She states the swelling improved about a week after stopping. She then restarted her mobic and noticed increase swelling yesterday.  Thyroid Problem Presents for follow-up visit. Symptoms include constipation and fatigue. Patient reports no cold intolerance,  depressed mood, diaphoresis, diarrhea or dry skin. The symptoms have been stable.      Review of Systems  Constitutional: Positive for fatigue. Negative for diaphoresis.  Cardiovascular: Positive for leg swelling.  Gastrointestinal: Positive for constipation. Negative for diarrhea.  Endo/Heme/Allergies: Negative for cold intolerance.  All other systems reviewed and are negative.    Observations/Objective: No SOB or distress noted   Assessment and Plan: Abigail Moss comes in today with chief complaint of No chief complaint on file.   Diagnosis and orders addressed:  1. Hypothyroidism, unspecified type Will hold off on labs as she just had TSH completed.  Will decrease to 125 mcg for levothyroxine and recheck in 2 months   2. Peripheral edema Compression hose Low salt diet Keep elevated  3. Sciatic leg pain Continue PT Keep Neurosurgeon appt - levothyroxine (SYNTHROID) 125 MCG tablet; Take 1 tablet (125 mcg total) by mouth daily.  Dispense: 90 tablet; Refill: 3   Labs pending Health Maintenance reviewed Diet and exercise encouraged  Follow up plan:   2 months        I discussed the assessment and treatment plan with the patient. The patient was provided an opportunity to ask questions and all were answered. The patient agreed with the plan and demonstrated an understanding of the instructions.   The patient was advised to call back or seek an in-person evaluation if the symptoms worsen or if the condition fails to improve as anticipated.  The above assessment and management plan was discussed with the patient. The patient verbalized understanding of and has agreed to the management plan. Patient is aware to call  the clinic if symptoms persist or worsen. Patient is aware when to return to the clinic for a follow-up visit. Patient educated on when it is appropriate to go to the emergency department.   Time call ended:  11:46 AM   I provided 15 minutes of  non-face-to-face time during this encounter.    Jannifer Rodney, FNP

## 2020-05-02 ENCOUNTER — Encounter: Payer: Self-pay | Admitting: Physical Therapy

## 2020-05-02 ENCOUNTER — Other Ambulatory Visit: Payer: Self-pay

## 2020-05-02 ENCOUNTER — Ambulatory Visit: Payer: Medicare Other | Admitting: Physical Therapy

## 2020-05-02 DIAGNOSIS — M5442 Lumbago with sciatica, left side: Secondary | ICD-10-CM

## 2020-05-02 DIAGNOSIS — R293 Abnormal posture: Secondary | ICD-10-CM | POA: Diagnosis not present

## 2020-05-02 NOTE — Therapy (Signed)
[facility] [address] [place], [place], [address] Phone: [phone]   Fax:  [phone]  Physical Therapy Treatment  Patient Details  Name: Abigail Moss MRN: [id number] Date of Birth: 08-03-1945 Referring Provider (PT): Jannifer Rodney   Encounter Date: 05/02/2020   PT End of Session - 05/02/20 1119    Visit Number 2    Number of Visits 12    Date for PT Re-Evaluation 06/11/20    Authorization Type FOTO.    PT Start Time 1119    PT Stop Time 1202    PT Time Calculation (min) 43 min    Activity Tolerance Patient tolerated treatment well    Behavior During Therapy WFL for tasks assessed/performed           Past Medical History:  Diagnosis Date  . GERD (gastroesophageal reflux disease)   . Hypertension   . Osteopenia   . Thyroid disease     Past Surgical History:  Procedure Laterality Date  . EAR TUBE REMOVAL    . ROTATOR CUFF REPAIR Left   . TONSILLECTOMY    . VEIN LIGATION AND STRIPPING      There were no vitals filed for this visit.   Subjective Assessment - 05/02/20 1108    Subjective COVID 19 screening performed on patient upon arrival. No LBP but reports 5/10 LLE pain in calf region but is worse at night.    Pertinent History Left RTC repair, HTN, osteopenia.    How long can you sit comfortably? Varies.    How long can you stand comfortably? Short time when pain is high.    How long can you walk comfortably? Short community distances.    Diagnostic tests X-ray.    Patient Stated Goals Get out of pain.    Currently in Pain? Yes    Pain Score 5     Pain Location Leg    Pain Orientation Left    Pain Descriptors / Indicators Cramping    Pain Type Acute pain    Pain Onset More than a month ago    Pain Frequency Constant              [facility] PT Assessment - 05/02/20 0001      Assessment   Medical Diagnosis Sciatica of left side.    Referring Provider (PT) Jannifer Rodney      Precautions   Precaution  Comments Osteopenia.      Restrictions   Weight Bearing Restrictions No                         [facility] Adult PT Treatment/Exercise - 05/02/20 0001      Exercises   Exercises Lumbar      Lumbar Exercises: Stretches   Passive Hamstring Stretch Left;3 reps;30 seconds    Single Knee to Chest Stretch Left;3 reps;30 seconds    Gastroc Stretch Left;3 reps;30 seconds      Lumbar Exercises: Aerobic   Nustep L4 x10 min      Lumbar Exercises: Supine   Ab Set 10 reps;5 seconds    Bridge 10 reps;3 seconds      Modalities   Modalities Electrical Stimulation;Moist Heat      Moist Heat Therapy   Number Minutes Moist Heat 8 Minutes    Moist Heat Location Lumbar Spine      Electrical Stimulation   Electrical Stimulation Location B low back    Electrical Stimulation Action Pre-Mod  Electrical Stimulation Parameters 80-150 hz x10 min    Electrical Stimulation Goals Tone;Pain                  PT Education - 05/02/20 1210    Education Details HEP- ab set, bridge, SKTC, HS and gastroc stretch    Person(s) Educated Patient    Methods Explanation;Verbal cues;Handout    Comprehension Verbalized understanding               PT Long Term Goals - 04/30/20 1122      PT LONG TERM GOAL #1   Title Independent with a HEP.    Time 6    Period Weeks    Status New      PT LONG TERM GOAL #2   Title Perform ADL's with pain not > 3/10.    Time 6    Period Weeks    Status New      PT LONG TERM GOAL #3   Title Eliminate referred pain.    Time 6    Period Weeks    Status New                 Plan - 05/02/20 1212    Clinical Impression Statement Patient presented in clinic with reports of B feet edema and LLE radicular pain to L calf. Patient correlates B feet edema to medication. Patient progresed through inital, low level core strengthening and stretching. Patient required moderate VCs to promote proper technique. Normal modalities response noted although  patient requested to end session early secondary to bathroom. Patient provided HEP and was provided education for technique and parameters.    Personal Factors and Comorbidities Comorbidity 1;Comorbidity 2    Comorbidities Left RTC repair, HTN, osteopenia.    Examination-Activity Limitations Other;Locomotion Level    Stability/Clinical Decision Making Evolving/Moderate complexity    Rehab Potential Good    PT Frequency 2x / week    PT Duration 6 weeks    PT Treatment/Interventions ADLs/Self Care Home Management;Cryotherapy;Electrical Stimulation;Ultrasound;Moist Heat;Therapeutic activities;Therapeutic exercise;Manual techniques;Patient/family education;Passive range of motion;Dry needling    PT Next Visit Plan Core exercise progression.  Modalties and STW/M as needed.    Consulted and Agree with Plan of Care Patient           Patient will benefit from skilled therapeutic intervention in order to improve the following deficits and impairments:  Pain, Decreased activity tolerance, Postural dysfunction  Visit Diagnosis: Acute left-sided low back pain with left-sided sciatica  Abnormal posture     Problem List Patient Active Problem List   Diagnosis Date Noted  . Acute recurrent frontal sinusitis 11/24/2019  . Benzodiazepine dependence (HCC) 07/15/2018  . Controlled substance agreement signed 07/15/2018  . GAD (generalized anxiety disorder) 12/18/2015  . Obesity (BMI 30-39.9) 12/18/2015  . Hypothyroidism 12/11/2014  . Allergic rhinitis 12/11/2014  . Vitamin D deficiency 12/11/2014  . Osteoporosis 12/11/2014  . HTN (hypertension) 12/08/2012  . GERD (gastroesophageal reflux disease) 12/08/2012    Marvell Fuller, PTA 05/02/2020, 12:16 PM  [facility] [address] [place], [place], [address] Phone: [phone]   Fax:  [phone]  Name: Abigail Moss MRN: [id number] Date of Birth: 03/23/1946

## 2020-05-04 ENCOUNTER — Ambulatory Visit: Payer: Medicare Other | Admitting: Family Medicine

## 2020-05-09 ENCOUNTER — Telehealth: Payer: Self-pay

## 2020-05-09 ENCOUNTER — Encounter: Payer: Self-pay | Admitting: Physical Therapy

## 2020-05-09 ENCOUNTER — Ambulatory Visit: Payer: Medicare Other | Attending: Family | Admitting: Physical Therapy

## 2020-05-09 ENCOUNTER — Other Ambulatory Visit: Payer: Self-pay

## 2020-05-09 DIAGNOSIS — R293 Abnormal posture: Secondary | ICD-10-CM | POA: Insufficient documentation

## 2020-05-09 DIAGNOSIS — G8929 Other chronic pain: Secondary | ICD-10-CM

## 2020-05-09 DIAGNOSIS — M5442 Lumbago with sciatica, left side: Secondary | ICD-10-CM | POA: Insufficient documentation

## 2020-05-09 NOTE — Telephone Encounter (Signed)
Pt called stating that she has been seeing a PT for her back and they recommended she get a script for Tens Unit. Says she talked to her insurance company and they told her that if her PCP wrote her a Rx for Tens Unit that they would cover it.  Pt is requesting that Neysa Bonito write Rx for it and send to her insurance.

## 2020-05-09 NOTE — Therapy (Signed)
[facility] [address] [place], [place], [address] Phone: [phone]   Fax:  [phone]  Physical Therapy Treatment  Patient Details  Name: Abigail Moss MRN: [id number] Date of Birth: [birth year] Referring Provider (PT): Jannifer Rodney   Encounter Date: 05/09/2020   PT End of Session - 05/09/20 1128    Visit Number 3    Number of Visits 12    Date for PT Re-Evaluation 06/11/20    Authorization Type FOTO.    PT Start Time 1116    PT Stop Time 1200    PT Time Calculation (min) 44 min    Activity Tolerance Patient tolerated treatment well    Behavior During Therapy WFL for tasks assessed/performed           Past Medical History:  Diagnosis Date  . GERD (gastroesophageal reflux disease)   . Hypertension   . Osteopenia   . Thyroid disease     Past Surgical History:  Procedure Laterality Date  . EAR TUBE REMOVAL    . ROTATOR CUFF REPAIR Left   . TONSILLECTOMY    . VEIN LIGATION AND STRIPPING      There were no vitals filed for this visit.   Subjective Assessment - 05/09/20 1126    Subjective COVID 19 screening performed on patient upon arrival. Reports pain is receding from L calf to L thigh/hip. Reports difficulty walking due to pain.    Pertinent History Left RTC repair, HTN, osteopenia.    How long can you sit comfortably? Varies.    How long can you stand comfortably? Short time when pain is high.    How long can you walk comfortably? Short community distances.    Diagnostic tests X-ray.    Patient Stated Goals Get out of pain.    Currently in Pain? Yes    Pain Score 5     Pain Location Hip    Pain Orientation Left    Pain Descriptors / Indicators Discomfort    Pain Type Acute pain    Pain Onset More than a month ago    Pain Frequency Constant              [facility] PT Assessment - 05/09/20 0001      Assessment   Medical Diagnosis Sciatica of left side.    Referring Provider (PT) Jannifer Rodney       Precautions   Precaution Comments Osteopenia.      Restrictions   Weight Bearing Restrictions No                         [facility] Adult PT Treatment/Exercise - 05/09/20 0001      Lumbar Exercises: Aerobic   Nustep L4 x11 min      Lumbar Exercises: Standing   Other Standing Lumbar Exercises Rockerboard x3 min      Lumbar Exercises: Seated   Other Seated Lumbar Exercises Ball pushdown x15 reps 5 sec holds      Lumbar Exercises: Supine   Clam 20 reps   red theraband   Bent Knee Raise 20 reps    Bridge 15 reps;2 seconds      Modalities   Modalities Electrical Stimulation;Moist Heat      Moist Heat Therapy   Number Minutes Moist Heat 10 Minutes    Moist Heat Location Lumbar Spine      Electrical Stimulation   Electrical Stimulation Location B low back    Electrical Stimulation Action  Pre-Mod    Electrical Stimulation Parameters 80-150 hz x10 min    Electrical Stimulation Goals Pain                       PT Long Term Goals - 04/30/20 1122      PT LONG TERM GOAL #1   Title Independent with a HEP.    Time 6    Period Weeks    Status New      PT LONG TERM GOAL #2   Title Perform ADL's with pain not > 3/10.    Time 6    Period Weeks    Status New      PT LONG TERM GOAL #3   Title Eliminate referred pain.    Time 6    Period Weeks    Status New                 Plan - 05/09/20 1213    Clinical Impression Statement Patient presented in clinic with reports of receding pain from L calf but pain is now predominately in L hip. Patient reports limitation of standing and ADLs due to pain especially ambulation. Patient stated that she has remained diligent with HEP. Patient progressed in regards to core strengthening as well as hip strengthening. Normal modalities response noted following removal of the modalities.    Personal Factors and Comorbidities Comorbidity 1;Comorbidity 2    Comorbidities Left RTC repair, HTN, osteopenia.     Examination-Activity Limitations Other;Locomotion Level    Stability/Clinical Decision Making Evolving/Moderate complexity    Rehab Potential Good    PT Frequency 2x / week    PT Duration 6 weeks    PT Treatment/Interventions ADLs/Self Care Home Management;Cryotherapy;Electrical Stimulation;Ultrasound;Moist Heat;Therapeutic activities;Therapeutic exercise;Manual techniques;Patient/family education;Passive range of motion;Dry needling    PT Next Visit Plan Core exercise progression.  Modalties and STW/M as needed.    Consulted and Agree with Plan of Care Patient           Patient will benefit from skilled therapeutic intervention in order to improve the following deficits and impairments:  Pain, Decreased activity tolerance, Postural dysfunction  Visit Diagnosis: Acute left-sided low back pain with left-sided sciatica  Abnormal posture     Problem List Patient Active Problem List   Diagnosis Date Noted  . Acute recurrent frontal sinusitis 11/24/2019  . Benzodiazepine dependence (HCC) 07/15/2018  . Controlled substance agreement signed 07/15/2018  . GAD (generalized anxiety disorder) 12/18/2015  . Obesity (BMI 30-39.9) 12/18/2015  . Hypothyroidism 12/11/2014  . Allergic rhinitis 12/11/2014  . Vitamin D deficiency 12/11/2014  . Osteoporosis 12/11/2014  . HTN (hypertension) 12/08/2012  . GERD (gastroesophageal reflux disease) 12/08/2012    Marvell Fuller, PTA 05/09/2020, 12:21 PM  [facility] [address] [place], [place], [address] Phone: [phone]   Fax:  [phone]  Name: Abigail Moss MRN: [id number] Date of Birth: June 13, 1946

## 2020-05-10 NOTE — Telephone Encounter (Signed)
Patient aware and verbalized understanding. °

## 2020-05-10 NOTE — Telephone Encounter (Signed)
TENS units rx printed.

## 2020-05-12 ENCOUNTER — Other Ambulatory Visit: Payer: Self-pay | Admitting: Family Medicine

## 2020-05-17 ENCOUNTER — Encounter: Payer: Medicare Other | Admitting: *Deleted

## 2020-06-08 DIAGNOSIS — M5117 Intervertebral disc disorders with radiculopathy, lumbosacral region: Secondary | ICD-10-CM | POA: Diagnosis not present

## 2020-06-08 DIAGNOSIS — M5136 Other intervertebral disc degeneration, lumbar region: Secondary | ICD-10-CM | POA: Diagnosis not present

## 2020-06-11 DIAGNOSIS — Z20822 Contact with and (suspected) exposure to covid-19: Secondary | ICD-10-CM | POA: Diagnosis not present

## 2020-06-14 ENCOUNTER — Encounter: Payer: Self-pay | Admitting: Family Medicine

## 2020-06-14 ENCOUNTER — Ambulatory Visit (INDEPENDENT_AMBULATORY_CARE_PROVIDER_SITE_OTHER): Payer: Medicare Other | Admitting: Family Medicine

## 2020-06-14 DIAGNOSIS — U071 COVID-19: Secondary | ICD-10-CM | POA: Diagnosis not present

## 2020-06-14 MED ORDER — PREDNISONE 10 MG PO TABS: ORAL_TABLET | ORAL | 0 refills | Status: DC

## 2020-06-14 MED ORDER — ONDANSETRON 8 MG PO TBDP: 8.0000 mg | ORAL_TABLET | Freq: Four times a day (QID) | ORAL | 1 refills | Status: DC | PRN

## 2020-06-14 MED ORDER — BENZONATATE 200 MG PO CAPS: 200.0000 mg | ORAL_CAPSULE | Freq: Three times a day (TID) | ORAL | 0 refills | Status: DC | PRN

## 2020-06-14 NOTE — Progress Notes (Signed)
Subjective:    Patient ID: Abigail Moss, female    DOB: 03-30-1946, 74 y.o.   MRN: [id number]   HPI: Abigail Moss is a 74 y.o. female presenting for gums are sore. Nose burning. Nauseated. Tired. Has lost sense of taste and smell. Fever 99.1. Maybe a little higher. Coughing waxing & waning since onset 5 days ago. Coughing yellow sputum. Using albuterol since chest is tight. Had CoVID test 3 days ago. Returned positive.    Depression screen [facility] 2/9 03/15/2020 02/20/2020 02/07/2020 01/30/2020 12/28/2019  Decreased Interest 0 0 0 0 0  Down, Depressed, Hopeless 0 0 0 0 0  PHQ - 2 Score 0 0 0 0 0  Altered sleeping - - - - -  Tired, decreased energy - - - - -  Change in appetite - - - - -  Feeling bad or failure about yourself  - - - - -  Trouble concentrating - - - - -  Moving slowly or fidgety/restless - - - - -  Suicidal thoughts - - - - -  PHQ-9 Score - - - - -  Some recent data might be hidden     Relevant past medical, surgical, family and social history reviewed and updated as indicated.  Interim medical history since our last visit reviewed. Allergies and medications reviewed and updated.  ROS:  Review of Systems  Constitutional: Positive for fatigue and fever.  HENT: Positive for congestion. Negative for postnasal drip, sinus pressure and sinus pain.   Respiratory: Positive for cough and shortness of breath (gets relief with her nebs/inhalers).   Musculoskeletal: Positive for myalgias.  Neurological: Positive for headaches.     Social History   Tobacco Use  Smoking Status Former Smoker  . Quit date: 07/07/1996  . Years since quitting: 23.9  Smokeless Tobacco Never Used       Objective:     Wt Readings from Last 3 Encounters:  03/28/20 185 lb (83.9 kg)  03/15/20 185 lb (83.9 kg)  03/08/20 185 lb (83.9 kg)     Exam deferred. Pt. Harboring due to COVID 19. Phone visit performed.   Assessment & Plan:   1. COVID-19 virus infection     Meds ordered this  encounter  Medications  . predniSONE (DELTASONE) 10 MG tablet    Sig: Take 5 daily for 2 days followed by 4,3,2 and 1 for 2 days each.    Dispense:  30 tablet    Refill:  0  . ondansetron (ZOFRAN-ODT) 8 MG disintegrating tablet    Sig: Take 1 tablet (8 mg total) by mouth every 6 (six) hours as needed for nausea or vomiting.    Dispense:  20 tablet    Refill:  1  . benzonatate (TESSALON) 200 MG capsule    Sig: Take 1 capsule (200 mg total) by mouth 3 (three) times daily as needed for cough.    Dispense:  20 capsule    Refill:  0      Diagnoses and all orders for this visit:  COVID-19 virus infection  Other orders -     predniSONE (DELTASONE) 10 MG tablet; Take 5 daily for 2 days followed by 4,3,2 and 1 for 2 days each. -     ondansetron (ZOFRAN-ODT) 8 MG disintegrating tablet; Take 1 tablet (8 mg total) by mouth every 6 (six) hours as needed for nausea or vomiting. -     benzonatate (TESSALON) 200 MG capsule; Take 1 capsule (200 mg total)  by mouth 3 (three) times daily as needed for cough.    Virtual Visit via telephone Note  I discussed the limitations, risks, security and privacy concerns of performing an evaluation and management service by telephone and the availability of in person appointments. The patient was identified with two identifiers. Pt.expressed understanding and agreed to proceed. Pt. Is at home. Dr. Darlyn Read is in his office.  Follow Up Instructions:   I discussed the assessment and treatment plan with the patient. The patient was provided an opportunity to ask questions and all were answered. The patient agreed with the plan and demonstrated an understanding of the instructions.   The patient was advised to call back or seek an in-person evaluation if the symptoms worsen or if the condition fails to improve as anticipated.   Total minutes including chart review and phone contact time: 24   Follow up plan: Return if symptoms worsen or fail to  improve.  Abigail Claude, MD [facility]

## 2020-06-22 ENCOUNTER — Other Ambulatory Visit: Payer: Self-pay | Admitting: Family

## 2020-06-22 DIAGNOSIS — M810 Age-related osteoporosis without current pathological fracture: Secondary | ICD-10-CM

## 2020-07-01 ENCOUNTER — Other Ambulatory Visit: Payer: Self-pay | Admitting: Family

## 2020-07-01 DIAGNOSIS — I10 Essential (primary) hypertension: Secondary | ICD-10-CM

## 2020-07-02 ENCOUNTER — Ambulatory Visit: Payer: Medicare Other | Admitting: Family

## 2020-07-03 ENCOUNTER — Ambulatory Visit (INDEPENDENT_AMBULATORY_CARE_PROVIDER_SITE_OTHER): Payer: Medicare Other | Admitting: Family

## 2020-07-03 ENCOUNTER — Encounter: Payer: Self-pay | Admitting: Family

## 2020-07-03 ENCOUNTER — Other Ambulatory Visit: Payer: Self-pay

## 2020-07-03 VITALS — BP 132/82 | HR 88 | Temp 96.9°F | Ht 66.0 in | Wt 183.6 lb

## 2020-07-03 DIAGNOSIS — E039 Hypothyroidism, unspecified: Secondary | ICD-10-CM

## 2020-07-03 DIAGNOSIS — K219 Gastro-esophageal reflux disease without esophagitis: Secondary | ICD-10-CM

## 2020-07-03 DIAGNOSIS — E559 Vitamin D deficiency, unspecified: Secondary | ICD-10-CM

## 2020-07-03 DIAGNOSIS — I10 Essential (primary) hypertension: Secondary | ICD-10-CM | POA: Diagnosis not present

## 2020-07-03 DIAGNOSIS — Z79899 Other long term (current) drug therapy: Secondary | ICD-10-CM | POA: Diagnosis not present

## 2020-07-03 DIAGNOSIS — R5383 Other fatigue: Secondary | ICD-10-CM | POA: Diagnosis not present

## 2020-07-03 DIAGNOSIS — E669 Obesity, unspecified: Secondary | ICD-10-CM

## 2020-07-03 DIAGNOSIS — R6889 Other general symptoms and signs: Secondary | ICD-10-CM | POA: Diagnosis not present

## 2020-07-03 DIAGNOSIS — F1320 Sedative, hypnotic or anxiolytic dependence, uncomplicated: Secondary | ICD-10-CM

## 2020-07-03 DIAGNOSIS — F411 Generalized anxiety disorder: Secondary | ICD-10-CM

## 2020-07-03 NOTE — Patient Instructions (Signed)

## 2020-07-03 NOTE — Progress Notes (Signed)
Subjective:    Patient ID: Abigail Moss, female    DOB: 04-08-1946, 74 y.o.   MRN: [id number] Chief Complaint  Patient presents with  . Fatigue    Been going on weeks wants labs checkecd for iron and thyroid    Pt presents to the office today for chronic follow up. She reports she feels fatigued over the last few months. She had  COVID at the beginning of the month.  Hypertension This is a chronic problem. The current episode started more than 1 year ago. The problem has been resolved since onset. The problem is controlled. Associated symptoms include anxiety and malaise/fatigue. Pertinent negatives include no peripheral edema or shortness of breath. Risk factors for coronary artery disease include dyslipidemia, obesity and sedentary lifestyle. The current treatment provides moderate improvement. There is no history of kidney disease, CVA or heart failure. Identifiable causes of hypertension include a thyroid problem.  Thyroid Problem Presents for follow-up visit. Symptoms include anxiety and fatigue. Patient reports no constipation or depressed mood. The symptoms have been stable. There is no history of heart failure.  Anxiety Presents for follow-up visit. Symptoms include excessive worry, irritability and nervous/anxious behavior. Patient reports no depressed mood or shortness of breath. Symptoms occur most days. The severity of symptoms is mild.    Gastroesophageal Reflux She complains of belching and heartburn. This is a chronic problem. The current episode started more than 1 year ago. The problem occurs occasionally. The problem has been waxing and waning. Associated symptoms include fatigue. She has tried a PPI for the symptoms. The treatment provided moderate relief.      Review of Systems  Constitutional: Positive for fatigue, irritability and malaise/fatigue.  Respiratory: Negative for shortness of breath.   Gastrointestinal: Positive for heartburn. Negative for constipation.   Psychiatric/Behavioral: The patient is nervous/anxious.   All other systems reviewed and are negative.      Objective:   Physical Exam Vitals reviewed.  Constitutional:      General: She is not in acute distress.    Appearance: She is well-developed and well-nourished.  HENT:     Head: Normocephalic and atraumatic.     Right Ear: Tympanic membrane normal.     Left Ear: Tympanic membrane normal.     Mouth/Throat:     Mouth: Oropharynx is clear and moist.  Eyes:     Pupils: Pupils are equal, round, and reactive to light.  Neck:     Thyroid: No thyromegaly.  Cardiovascular:     Rate and Rhythm: Normal rate and regular rhythm.     Pulses: Intact distal pulses.     Heart sounds: Normal heart sounds. No murmur heard.   Pulmonary:     Effort: Pulmonary effort is normal. No respiratory distress.     Breath sounds: Normal breath sounds. No wheezing.  Abdominal:     General: Bowel sounds are normal. There is no distension.     Palpations: Abdomen is soft.     Tenderness: There is no abdominal tenderness.  Musculoskeletal:        General: No tenderness or edema. Normal range of motion.     Cervical back: Normal range of motion and neck supple.  Skin:    General: Skin is warm and dry.  Neurological:     Mental Status: She is alert and oriented to person, place, and time.     Cranial Nerves: No cranial nerve deficit.     Deep Tendon Reflexes: Reflexes are normal and symmetric.  Psychiatric:        Mood and Affect: Mood and affect normal.        Behavior: Behavior normal.        Thought Content: Thought content normal.        Judgment: Judgment normal.          BP 132/82   Pulse 88   Temp (!) 96.9 F (36.1 C) (Temporal)   Ht '5\' 6"'  (1.676 m)   Wt 183 lb 9.6 oz (83.3 kg)   BMI 29.63 kg/m   Assessment & Plan:  Abigail Moss comes in today with chief complaint of Fatigue (Been going on weeks wants labs checkecd for iron and thyroid )   Diagnosis and orders  addressed:  1. Primary hypertension - CMP14+EGFR  2. Gastroesophageal reflux disease, unspecified whether esophagitis present - CMP14+EGFR  3. Hypothyroidism, unspecified type - CMP14+EGFR - TSH  4. Benzodiazepine dependence (HCC) - CMP14+EGFR  5. Controlled substance agreement signed - CMP14+EGFR  6. Vitamin D deficiency - CMP14+EGFR - VITAMIN D 25 Hydroxy (Vit-D Deficiency, Fractures)  7. GAD (generalized anxiety disorder) - CMP14+EGFR  8. Obesity (BMI 30-39.9) - CMP14+EGFR  9. Fatigue, unspecified type - CMP14+EGFR - Anemia Profile B   Labs pending Health Maintenance reviewed Diet and exercise encouraged  Follow up plan: 6 months    [name], FNP

## 2020-07-04 ENCOUNTER — Other Ambulatory Visit: Payer: Self-pay | Admitting: Physical Medicine and Rehabilitation

## 2020-07-04 DIAGNOSIS — M5459 Other low back pain: Secondary | ICD-10-CM | POA: Diagnosis not present

## 2020-07-04 DIAGNOSIS — M5136 Other intervertebral disc degeneration, lumbar region: Secondary | ICD-10-CM | POA: Diagnosis not present

## 2020-07-04 DIAGNOSIS — M545 Low back pain, unspecified: Secondary | ICD-10-CM

## 2020-07-04 LAB — ANEMIA PROFILE B
Basophils Absolute: 0.1 10*3/uL (ref 0.0–0.2)
Basos: 1 %
EOS (ABSOLUTE): 0.3 10*3/uL (ref 0.0–0.4)
Eos: 5 %
Ferritin: 79 ng/mL (ref 15–150)
Folate: >20 ng/mL (ref >3.0)
Hematocrit: 38.1 % (ref 34.0–46.6)
Hemoglobin: 13 g/dL (ref 11.1–15.9)
Immature Grans (Abs): 0 10*3/uL (ref 0.0–0.1)
Immature Granulocytes: 0 %
Iron Saturation: 29 % (ref 15–55)
Iron: 73 ug/dL (ref 27–139)
Lymphocytes Absolute: 1.4 10*3/uL (ref 0.7–3.1)
Lymphs: 26 %
MCH: 32.3 pg (ref 26.6–33.0)
MCHC: 34.1 g/dL (ref 31.5–35.7)
MCV: 95 fL (ref 79–97)
Monocytes Absolute: 0.5 10*3/uL (ref 0.1–0.9)
Monocytes: 9 %
Neutrophils Absolute: 3.2 10*3/uL (ref 1.4–7.0)
Neutrophils: 59 %
Platelets: 225 10*3/uL (ref 150–450)
RBC: 4.03 x10E6/uL (ref 3.77–5.28)
RDW: 12.9 % (ref 11.7–15.4)
Retic Ct Pct: 1.2 % (ref 0.6–2.6)
Total Iron Binding Capacity: 248 ug/dL — ABNORMAL LOW (ref 250–450)
UIBC: 175 ug/dL (ref 118–369)
Vitamin B-12: 521 pg/mL (ref 232–1245)
WBC: 5.4 10*3/uL (ref 3.4–10.8)

## 2020-07-04 LAB — VITAMIN D 25 HYDROXY (VIT D DEFICIENCY, FRACTURES): Vit D, 25-Hydroxy: 54.4 ng/mL (ref 30.0–100.0)

## 2020-07-04 LAB — CMP14+EGFR
ALT: 22 IU/L (ref 0–32)
AST: 19 IU/L (ref 0–40)
Albumin/Globulin Ratio: 1.8 (ref 1.2–2.2)
Albumin: 4.1 g/dL (ref 3.7–4.7)
Alkaline Phosphatase: 67 IU/L (ref 44–121)
BUN/Creatinine Ratio: 19 (ref 12–28)
BUN: 14 mg/dL (ref 8–27)
Bilirubin Total: 0.3 mg/dL (ref 0.0–1.2)
CO2: 24 mmol/L (ref 20–29)
Calcium: 9.5 mg/dL (ref 8.7–10.3)
Chloride: 102 mmol/L (ref 96–106)
Creatinine, Ser: 0.75 mg/dL (ref 0.57–1.00)
GFR calc Af Amer: 90 mL/min/{1.73_m2} (ref >59)
GFR calc non Af Amer: 78 mL/min/{1.73_m2} (ref >59)
Globulin, Total: 2.3 g/dL (ref 1.5–4.5)
Glucose: 87 mg/dL (ref 65–99)
Potassium: 4.1 mmol/L (ref 3.5–5.2)
Sodium: 140 mmol/L (ref 134–144)
Total Protein: 6.4 g/dL (ref 6.0–8.5)

## 2020-07-04 LAB — TSH: TSH: 1.22 u[IU]/mL (ref 0.450–4.500)

## 2020-07-13 ENCOUNTER — Ambulatory Visit: Payer: Medicare Other | Admitting: Family

## 2020-07-17 ENCOUNTER — Telehealth: Payer: Self-pay

## 2020-07-17 NOTE — Telephone Encounter (Signed)
Pt made aware of all results. She states that she has been feeling tired. Believes it is due to taking muscle relaxants. She will d/c those and see how she feels.

## 2020-08-06 ENCOUNTER — Ambulatory Visit
Admission: RE | Admit: 2020-08-06 | Discharge: 2020-08-06 | Disposition: A | Discharge status: Home / Self Care | Payer: Medicare Other | Source: Ambulatory Visit | Attending: Physical Medicine and Rehabilitation | Admitting: Physical Medicine and Rehabilitation

## 2020-08-06 ENCOUNTER — Other Ambulatory Visit: Payer: Self-pay

## 2020-08-06 DIAGNOSIS — M545 Low back pain, unspecified: Secondary | ICD-10-CM | POA: Diagnosis not present

## 2020-08-06 DIAGNOSIS — M48061 Spinal stenosis, lumbar region without neurogenic claudication: Secondary | ICD-10-CM | POA: Diagnosis not present

## 2020-09-03 DIAGNOSIS — Z79891 Long term (current) use of opiate analgesic: Secondary | ICD-10-CM | POA: Diagnosis not present

## 2020-09-19 ENCOUNTER — Encounter: Payer: Self-pay | Admitting: Family Medicine

## 2020-09-19 ENCOUNTER — Ambulatory Visit (INDEPENDENT_AMBULATORY_CARE_PROVIDER_SITE_OTHER): Payer: Medicare Other | Admitting: Family Medicine

## 2020-09-19 DIAGNOSIS — J988 Other specified respiratory disorders: Secondary | ICD-10-CM

## 2020-09-19 DIAGNOSIS — B9689 Other specified bacterial agents as the cause of diseases classified elsewhere: Secondary | ICD-10-CM | POA: Diagnosis not present

## 2020-09-19 MED ORDER — AZITHROMYCIN 250 MG PO TABS: ORAL_TABLET | ORAL | 0 refills | Status: DC

## 2020-09-19 MED ORDER — METHYLPREDNISOLONE 4 MG PO TBPK: ORAL_TABLET | ORAL | 0 refills | Status: DC

## 2020-09-19 NOTE — Progress Notes (Signed)
Virtual Visit via Telephone Note  I connected with Abigail Moss on 09/19/20 at 9:17 AM by telephone and verified that I am speaking with the correct person using two identifiers. Abigail Moss is currently located at home and nobody is currently with her during this visit. The provider, Abigail Fudge, FNP is located in their home at time of visit.  I discussed the limitations, risks, security and privacy concerns of performing an evaluation and management service by telephone and the availability of in person appointments. I also discussed with the patient that there may be a patient responsible charge related to this service. The patient expressed understanding and agreed to proceed.  Subjective: PCP: Abigail Spencer, FNP  Chief Complaint  Patient presents with  . Cough  . Sore Throat   Patient complains of cough and sore throat. Additional symptoms include head/chest congestion, runny nose, sneezing, ear pain/pressure and postnasal drainage. Onset of symptoms was 2 days ago, gradually worsening since that time. She is drinking plenty of fluids. Evaluation to date: none. Treatment to date: Mucinex & Albuterol inhaler. She has a history of allergies. She does not smoke.    ROS: Per HPI  Current Outpatient Medications:  .  acetic acid-hydrocortisone (VOSOL-HC) OTIC solution, Place 4 drops into both ears 4 (four) times daily., Disp: 10 mL, Rfl: 2 .  albuterol (VENTOLIN HFA) 108 (90 Base) MCG/ACT inhaler, TAKE 2 PUFFS BY MOUTH EVERY 6 HOURS AS NEEDED FOR WHEEZE OR SHORTNESS OF BREATH (Patient not taking: No sig reported), Disp: 18 g, Rfl: 0 .  alendronate (FOSAMAX) 70 MG tablet, TAKE 1 TABLET BY MOUTH  WEEKLY WITH 8 OUNCES OF  PLAIN WATER 1/2 HOUR BEFORE FIRST FOOD DRINK OR MEDS.  STAY UPRIGHT FOR 1/2 HOUR., Disp: 12 tablet, Rfl: 3 .  ALPRAZolam (XANAX) 0.5 MG tablet, Take 1 tablet (0.5 mg total) by mouth at bedtime as needed., Disp: 30 tablet, Rfl: 3 .  aspirin EC 81 MG tablet, Take 81 mg  by mouth daily., Disp: , Rfl:  .  baclofen (LIORESAL) 10 MG tablet, TAKE 1/2 TABLET (5 MG TOTAL) BY MOUTH 3 (THREE) TIMES DAILY., Disp: 90 tablet, Rfl: 1 .  buPROPion (WELLBUTRIN XL) 150 MG 24 hr tablet, Take 1 tablet (150 mg total) by mouth daily., Disp: 90 tablet, Rfl: 2 .  Calcium Citrate-Vitamin D (CALCIUM CITRATE + PO), Take 600 mg by mouth daily., Disp: , Rfl:  .  Cholecalciferol (VITAMIN D-3 PO), Take 800 mg by mouth daily., Disp: , Rfl:  .  fluticasone (FLONASE) 50 MCG/ACT nasal spray, SPRAY 2 SPRAYS INTO EACH NOSTRIL EVERY DAY, Disp: 48 mL, Rfl: 1 .  HYDROcodone-acetaminophen (NORCO) 10-325 MG tablet, Take 1 tablet by mouth 3 (three) times daily as needed., Disp: , Rfl:  .  levothyroxine (SYNTHROID) 125 MCG tablet, Take 1 tablet (125 mcg total) by mouth daily., Disp: 90 tablet, Rfl: 3 .  losartan (COZAAR) 100 MG tablet, TAKE 1 TABLET BY MOUTH  DAILY, Disp: 90 tablet, Rfl: 0 .  montelukast (SINGULAIR) 10 MG tablet, TAKE 1 TABLET BY MOUTH  DAILY AT BEDTIME, Disp: 90 tablet, Rfl: 3 .  MULTIPLE VITAMIN PO, Take by mouth., Disp: , Rfl:  .  omeprazole (PRILOSEC) 40 MG capsule, TAKE 1 CAPSULE BY MOUTH  DAILY, Disp: 90 capsule, Rfl: 3 .  traMADol (ULTRAM) 50 MG tablet, Take 1-2 tablets (50-100 mg total) by mouth every 12 (twelve) hours as needed., Disp: 60 tablet, Rfl: 0  Allergies  Allergen Reactions  .  Elemental Sulfur     REACTION: itching  . Gabapentin   . Meloxicam   . Neomycin     REACTION: itching  . Latex Rash   Past Medical History:  Diagnosis Date  . GERD (gastroesophageal reflux disease)   . Hypertension   . Osteopenia   . Thyroid disease     Observations/Objective: A&O  No respiratory distress or wheezing audible over the phone Mood, judgement, and thought processes all WNL  Assessment and Plan: 1. Bacterial respiratory infection Discussed symptom management. - azithromycin (ZITHROMAX Z-PAK) 250 MG tablet; Take 2 tablets (500 mg) PO today, then 1 tablet (250 mg)  PO daily x4 days.  Dispense: 6 tablet; Refill: 0 - methylPREDNISolone (MEDROL DOSEPAK) 4 MG TBPK tablet; Use as directed.  Dispense: 21 each; Refill: 0   Follow Up Instructions:  I discussed the assessment and treatment plan with the patient. The patient was provided an opportunity to ask questions and all were answered. The patient agreed with the plan and demonstrated an understanding of the instructions.   The patient was advised to call back or seek an in-person evaluation if the symptoms worsen or if the condition fails to improve as anticipated.  The above assessment and management plan was discussed with the patient. The patient verbalized understanding of and has agreed to the management plan. Patient is aware to call the clinic if symptoms persist or worsen. Patient is aware when to return to the clinic for a follow-up visit. Patient educated on when it is appropriate to go to the emergency department.   Time call ended: 9:24 AM  I provided 7 minutes of non-face-to-face time during this encounter.  Abigail Boston, MSN, APRN, FNP-C [facility] 09/19/20

## 2020-10-06 ENCOUNTER — Other Ambulatory Visit: Payer: Self-pay | Admitting: Family

## 2020-10-06 DIAGNOSIS — M5430 Sciatica, unspecified side: Secondary | ICD-10-CM

## 2020-10-06 DIAGNOSIS — K219 Gastro-esophageal reflux disease without esophagitis: Secondary | ICD-10-CM

## 2020-10-06 DIAGNOSIS — I10 Essential (primary) hypertension: Secondary | ICD-10-CM

## 2020-11-01 ENCOUNTER — Ambulatory Visit: Payer: Medicare Other | Admitting: Family

## 2020-11-02 DIAGNOSIS — M5416 Radiculopathy, lumbar region: Secondary | ICD-10-CM | POA: Diagnosis not present

## 2020-11-13 DIAGNOSIS — M79676 Pain in unspecified toe(s): Secondary | ICD-10-CM | POA: Diagnosis not present

## 2020-11-13 DIAGNOSIS — M2042 Other hammer toe(s) (acquired), left foot: Secondary | ICD-10-CM | POA: Diagnosis not present

## 2020-11-19 ENCOUNTER — Other Ambulatory Visit: Payer: Self-pay

## 2020-11-19 ENCOUNTER — Encounter: Payer: Self-pay | Admitting: Family

## 2020-11-19 ENCOUNTER — Ambulatory Visit (INDEPENDENT_AMBULATORY_CARE_PROVIDER_SITE_OTHER): Payer: Medicare Other | Admitting: Family

## 2020-11-19 VITALS — BP 125/85 | HR 69 | Temp 98.0°F | Ht 66.0 in | Wt 179.8 lb

## 2020-11-19 DIAGNOSIS — F1320 Sedative, hypnotic or anxiolytic dependence, uncomplicated: Secondary | ICD-10-CM

## 2020-11-19 DIAGNOSIS — I10 Essential (primary) hypertension: Secondary | ICD-10-CM | POA: Diagnosis not present

## 2020-11-19 DIAGNOSIS — E559 Vitamin D deficiency, unspecified: Secondary | ICD-10-CM

## 2020-11-19 DIAGNOSIS — E039 Hypothyroidism, unspecified: Secondary | ICD-10-CM

## 2020-11-19 DIAGNOSIS — K219 Gastro-esophageal reflux disease without esophagitis: Secondary | ICD-10-CM | POA: Diagnosis not present

## 2020-11-19 DIAGNOSIS — Z79899 Other long term (current) drug therapy: Secondary | ICD-10-CM | POA: Diagnosis not present

## 2020-11-19 DIAGNOSIS — E669 Obesity, unspecified: Secondary | ICD-10-CM

## 2020-11-19 DIAGNOSIS — F411 Generalized anxiety disorder: Secondary | ICD-10-CM | POA: Diagnosis not present

## 2020-11-19 MED ORDER — ALPRAZOLAM 0.5 MG PO TABS: 0.5000 mg | ORAL_TABLET | Freq: Every evening | ORAL | 3 refills | Status: DC | PRN

## 2020-11-19 NOTE — Progress Notes (Signed)
Subjective:    Patient ID: Abigail Moss, female    DOB: [birth year], 75 y.o.   MRN: [id number]  Chief Complaint  Patient presents with  . Medical Management of Chronic Issues   Pt presents to the office today for chronic follow up. Hypertension This is a chronic problem. The current episode started more than 1 year ago. The problem has been resolved since onset. The problem is controlled. Associated symptoms include anxiety and malaise/fatigue. Pertinent negatives include no peripheral edema or shortness of breath. Risk factors for coronary artery disease include dyslipidemia, obesity and sedentary lifestyle. The current treatment provides moderate improvement. There is no history of CVA or heart failure. Identifiable causes of hypertension include a thyroid problem.  Gastroesophageal Reflux She complains of belching, heartburn and a hoarse voice. This is a chronic problem. The current episode started more than 1 year ago. The problem occurs occasionally. The problem has been waxing and waning. The symptoms are aggravated by certain foods. Associated symptoms include fatigue. She has tried a PPI for the symptoms. The treatment provided moderate relief.  Thyroid Problem Presents for follow-up visit. Symptoms include fatigue and hoarse voice. Patient reports no depressed mood or dry skin. The symptoms have been stable. There is no history of heart failure.  Arthritis Presents for follow-up visit. She complains of pain and stiffness. Affected locations include the right knee, left knee, left shoulder, right shoulder, left MCP and right MCP. Her pain is at a severity of 4/10. Associated symptoms include fatigue.  Anxiety Presents for follow-up visit. Symptoms include excessive worry, irritability and restlessness. Patient reports no depressed mood or shortness of breath. Symptoms occur most days. The severity of symptoms is moderate.        Review of Systems  Constitutional: Positive for  fatigue, irritability and malaise/fatigue.  HENT: Positive for hoarse voice.   Respiratory: Negative for shortness of breath.   Gastrointestinal: Positive for heartburn.  Musculoskeletal: Positive for arthritis and stiffness.  All other systems reviewed and are negative.      Objective:   Physical Exam Vitals reviewed.  Constitutional:      General: She is not in acute distress.    Appearance: She is well-developed. She is obese.  HENT:     Head: Normocephalic and atraumatic.     Right Ear: Tympanic membrane normal.     Left Ear: Tympanic membrane normal.  Eyes:     Pupils: Pupils are equal, round, and reactive to light.  Neck:     Thyroid: No thyromegaly.  Cardiovascular:     Rate and Rhythm: Normal rate and regular rhythm.     Heart sounds: Normal heart sounds. No murmur heard.   Pulmonary:     Effort: Pulmonary effort is normal. No respiratory distress.     Breath sounds: Normal breath sounds. No wheezing.  Abdominal:     General: Bowel sounds are normal. There is no distension.     Palpations: Abdomen is soft.     Tenderness: There is no abdominal tenderness.  Musculoskeletal:        General: Tenderness present.     Cervical back: Normal range of motion and neck supple.     Comments: Pain in lumbar with flexion and extension  Skin:    General: Skin is warm and dry.  Neurological:     Mental Status: She is alert and oriented to person, place, and time.     Cranial Nerves: No cranial nerve deficit.     Deep  Tendon Reflexes: Reflexes are normal and symmetric.  Psychiatric:        Behavior: Behavior normal.        Thought Content: Thought content normal.        Judgment: Judgment normal.       BP 125/85   Pulse 69   Temp 98 F (36.7 C) (Temporal)   Ht 5' 6" (1.676 m)   Wt 179 lb 12.8 oz (81.6 kg)   BMI 29.02 kg/m      Assessment & Plan:  Abigail Moss comes in today with chief complaint of Medical Management of Chronic Issues   Diagnosis and orders  addressed:  1. Benzodiazepine dependence (HCC) - ALPRAZolam (XANAX) 0.5 MG tablet; Take 1 tablet (0.5 mg total) by mouth at bedtime as needed.  Dispense: 30 tablet; Refill: 3 - CMP14+EGFR - CBC with Differential/Platelet - ToxASSURE Select 13 (MW), Urine  2. Controlled substance agreement signed - ALPRAZolam (XANAX) 0.5 MG tablet; Take 1 tablet (0.5 mg total) by mouth at bedtime as needed.  Dispense: 30 tablet; Refill: 3 - CMP14+EGFR - CBC with Differential/Platelet - ToxASSURE Select 13 (MW), Urine  3. GAD (generalized anxiety disorder) - ALPRAZolam (XANAX) 0.5 MG tablet; Take 1 tablet (0.5 mg total) by mouth at bedtime as needed.  Dispense: 30 tablet; Refill: 3 - CMP14+EGFR - CBC with Differential/Platelet - ToxASSURE Select 13 (MW), Urine  4. Primary hypertension - CMP14+EGFR - CBC with Differential/Platelet  5. Gastroesophageal reflux disease, unspecified whether esophagitis present  - CMP14+EGFR - CBC with Differential/Platelet  6. Hypothyroidism, unspecified type - CMP14+EGFR - CBC with Differential/Platelet - TSH  7. Vitamin D deficiency  - CMP14+EGFR - CBC with Differential/Platelet  8. Obesity (BMI 30-39.9)  - CMP14+EGFR - CBC with Differential/Platelet   Labs pending Patient reviewed in [place] controlled database, no flags noted. Contract and drug screen up dated today.  Health Maintenance reviewed Diet and exercise encouraged  Follow up plan: 3 months    [name], FNP

## 2020-11-19 NOTE — Patient Instructions (Signed)
[email or web address]">  Generalized Anxiety Disorder, Adult Generalized anxiety disorder (GAD) is a mental health condition. Unlike normal worries, anxiety related to GAD is not triggered by a specific event. These worries do not fade or get better with time. GAD interferes with relationships, work, and school. GAD symptoms can vary from mild to severe. People with severe GAD can have intense waves of anxiety with physical symptoms that are similar to panic attacks. What are the causes? The exact cause of GAD is not known, but the following are believed to have an impact:  Differences in natural brain chemicals.  Genes passed down from parents to children.  Differences in the way threats are perceived.  Development during childhood.  Personality. What increases the risk? The following factors may make you more likely to develop this condition:  Being female.  Having a family history of anxiety disorders.  Being very shy.  Experiencing very stressful life events, such as the death of a loved one.  Having a very stressful family environment. What are the signs or symptoms? People with GAD often worry excessively about many things in their lives, such as their health and family. Symptoms may also include:  Mental and emotional symptoms: ? Worrying excessively about natural disasters. ? Fear of being late. ? Difficulty concentrating. ? Fears that others are judging your performance.  Physical symptoms: ? Fatigue. ? Headaches, muscle tension, muscle twitches, trembling, or feeling shaky. ? Feeling like your heart is pounding or beating very fast. ? Feeling out of breath or like you cannot take a deep breath. ? Having trouble falling asleep or staying asleep, or experiencing restlessness. ? Sweating. ? Nausea, diarrhea, or irritable bowel syndrome (IBS).  Behavioral symptoms: ? Experiencing erratic moods or irritability. ? Avoidance of new situations. ? Avoidance of  people. ? Extreme difficulty making decisions. How is this diagnosed? This condition is diagnosed based on your symptoms and medical history. You will also have a physical exam. Your health care provider may perform tests to rule out other possible causes of your symptoms. To be diagnosed with GAD, a person must have anxiety that:  Is out of his or her control.  Affects several different aspects of his or her life, such as work and relationships.  Causes distress that makes him or her unable to take part in normal activities.  Includes at least three symptoms of GAD, such as restlessness, fatigue, trouble concentrating, irritability, muscle tension, or sleep problems. Before your health care provider can confirm a diagnosis of GAD, these symptoms must be present more days than they are not, and they must last for 6 months or longer. How is this treated? This condition may be treated with:  Medicine. Antidepressant medicine is usually prescribed for long-term daily control. Anti-anxiety medicines may be added in severe cases, especially when panic attacks occur.  Talk therapy (psychotherapy). Certain types of talk therapy can be helpful in treating GAD by providing support, education, and guidance. Options include: ? Cognitive behavioral therapy (CBT). People learn coping skills and self-calming techniques to ease their physical symptoms. They learn to identify unrealistic thoughts and behaviors and to replace them with more appropriate thoughts and behaviors. ? Acceptance and commitment therapy (ACT). This treatment teaches people how to be mindful as a way to cope with unwanted thoughts and feelings. ? Biofeedback. This process trains you to manage your body's response (physiological response) through breathing techniques and relaxation methods. You will work with a therapist while machines are used to monitor your physical   symptoms.  Stress management techniques. These include yoga,  meditation, and exercise. A mental health specialist can help determine which treatment is best for you. Some people see improvement with one type of therapy. However, other people require a combination of therapies.   Follow these instructions at home: Lifestyle  Maintain a consistent routine and schedule.  Anticipate stressful situations. Create a plan, and allow extra time to work with your plan.  Practice stress management or self-calming techniques that you have learned from your therapist or your health care provider. General instructions  Take over-the-counter and prescription medicines only as told by your health care provider.  Understand that you are likely to have setbacks. Accept this and be kind to yourself as you persist to take better care of yourself.  Recognize and accept your accomplishments, even if you judge them as small.  Keep all follow-up visits as told by your health care provider. This is important. Contact a health care provider if:  Your symptoms do not get better.  Your symptoms get worse.  You have signs of depression, such as: ? A persistently sad or irritable mood. ? Loss of enjoyment in activities that used to bring you joy. ? Change in weight or eating. ? Changes in sleeping habits. ? Avoiding friends or family members. ? Loss of energy for normal tasks. ? Feelings of guilt or worthlessness. Get help right away if:  You have serious thoughts about hurting yourself or others. If you ever feel like you may hurt yourself or others, or have thoughts about taking your own life, get help right away. Go to your nearest emergency department or:  Call your local emergency services (911 in the [place]  Call a suicide crisis helpline, such as the National Suicide Prevention Lifeline at 1-800-273-8255. This is open 24 hours a day in the [place]  Text the Crisis Text Line at 741741 (in the [place] Summary  Generalized anxiety disorder (GAD) is a mental  health condition that involves worry that is not triggered by a specific event.  People with GAD often worry excessively about many things in their lives, such as their health and family.  GAD may cause symptoms such as restlessness, trouble concentrating, sleep problems, frequent sweating, nausea, diarrhea, headaches, and trembling or muscle twitching.  A mental health specialist can help determine which treatment is best for you. Some people see improvement with one type of therapy. However, other people require a combination of therapies. This information is not intended to replace advice given to you by your health care provider. Make sure you discuss any questions you have with your health care provider. Document Revised: 04/13/2019 Document Reviewed: 04/13/2019 Elsevier Patient Education  2021 Elsevier Inc.  

## 2020-11-20 LAB — CMP14+EGFR
ALT: 15 IU/L (ref 0–32)
AST: 22 IU/L (ref 0–40)
Albumin/Globulin Ratio: 2.3 — ABNORMAL HIGH (ref 1.2–2.2)
Albumin: 4.3 g/dL (ref 3.7–4.7)
Alkaline Phosphatase: 61 IU/L (ref 44–121)
BUN/Creatinine Ratio: 22 (ref 12–28)
BUN: 17 mg/dL (ref 8–27)
Bilirubin Total: 0.4 mg/dL (ref 0.0–1.2)
CO2: 24 mmol/L (ref 20–29)
Calcium: 9 mg/dL (ref 8.7–10.3)
Chloride: 103 mmol/L (ref 96–106)
Creatinine, Ser: 0.77 mg/dL (ref 0.57–1.00)
Globulin, Total: 1.9 g/dL (ref 1.5–4.5)
Glucose: 80 mg/dL (ref 65–99)
Potassium: 4.1 mmol/L (ref 3.5–5.2)
Sodium: 140 mmol/L (ref 134–144)
Total Protein: 6.2 g/dL (ref 6.0–8.5)
eGFR: 80 mL/min/{1.73_m2} (ref >59)

## 2020-11-20 LAB — CBC WITH DIFFERENTIAL/PLATELET
Basophils Absolute: 0.1 10*3/uL (ref 0.0–0.2)
Basos: 1 %
EOS (ABSOLUTE): 0.2 10*3/uL (ref 0.0–0.4)
Eos: 6 %
Hematocrit: 36.5 % (ref 34.0–46.6)
Hemoglobin: 12.6 g/dL (ref 11.1–15.9)
Immature Grans (Abs): 0 10*3/uL (ref 0.0–0.1)
Immature Granulocytes: 0 %
Lymphocytes Absolute: 1.5 10*3/uL (ref 0.7–3.1)
Lymphs: 39 %
MCH: 32.1 pg (ref 26.6–33.0)
MCHC: 34.5 g/dL (ref 31.5–35.7)
MCV: 93 fL (ref 79–97)
Monocytes Absolute: 0.3 10*3/uL (ref 0.1–0.9)
Monocytes: 9 %
Neutrophils Absolute: 1.8 10*3/uL (ref 1.4–7.0)
Neutrophils: 45 %
Platelets: 238 10*3/uL (ref 150–450)
RBC: 3.93 x10E6/uL (ref 3.77–5.28)
RDW: 13 % (ref 11.7–15.4)
WBC: 4 10*3/uL (ref 3.4–10.8)

## 2020-11-20 LAB — TSH: TSH: 0.636 u[IU]/mL (ref 0.450–4.500)

## 2020-12-03 ENCOUNTER — Other Ambulatory Visit: Payer: Self-pay | Admitting: Family

## 2020-12-03 DIAGNOSIS — F411 Generalized anxiety disorder: Secondary | ICD-10-CM

## 2020-12-05 ENCOUNTER — Other Ambulatory Visit: Payer: Self-pay | Admitting: Family

## 2020-12-05 DIAGNOSIS — I10 Essential (primary) hypertension: Secondary | ICD-10-CM

## 2020-12-05 DIAGNOSIS — M5430 Sciatica, unspecified side: Secondary | ICD-10-CM

## 2020-12-05 DIAGNOSIS — K219 Gastro-esophageal reflux disease without esophagitis: Secondary | ICD-10-CM

## 2020-12-12 DIAGNOSIS — M791 Myalgia, unspecified site: Secondary | ICD-10-CM | POA: Diagnosis not present

## 2020-12-12 DIAGNOSIS — G894 Chronic pain syndrome: Secondary | ICD-10-CM | POA: Diagnosis not present

## 2020-12-18 ENCOUNTER — Ambulatory Visit (INDEPENDENT_AMBULATORY_CARE_PROVIDER_SITE_OTHER): Payer: Medicare Other | Admitting: Nurse Practitioner

## 2020-12-18 ENCOUNTER — Encounter: Payer: Self-pay | Admitting: Nurse Practitioner

## 2020-12-18 VITALS — Temp 99.8°F

## 2020-12-18 DIAGNOSIS — J0111 Acute recurrent frontal sinusitis: Secondary | ICD-10-CM | POA: Diagnosis not present

## 2020-12-18 MED ORDER — AMOXICILLIN-POT CLAVULANATE 875-125 MG PO TABS: 1.0000 | ORAL_TABLET | Freq: Two times a day (BID) | ORAL | 0 refills | Status: DC

## 2020-12-18 MED ORDER — PREDNISONE 10 MG (21) PO TBPK: ORAL_TABLET | ORAL | 0 refills | Status: DC

## 2020-12-18 NOTE — Assessment & Plan Note (Signed)
Recurrent sinusitis.  Provided education to increase hydration, take medication as prescribed, Augmentin 875-125 mg tablet by mouth daily  Tylenol for for headache/fever, prednisone taper.  Rx sent to pharmacy.  Follow-up with worsening or unresolved symptoms.

## 2020-12-18 NOTE — Progress Notes (Signed)
   Virtual Visit  Note Due to COVID-19 pandemic this visit was conducted virtually. This visit type was conducted due to national recommendations for restrictions regarding the COVID-19 Pandemic (e.g. social distancing, sheltering in place) in an effort to limit this patient's exposure and mitigate transmission in our community. All issues noted in this document were discussed and addressed.  A physical exam was not performed with this format.  I connected with Abigail Moss on 12/18/20 at 9 2 AM by telephone and verified that I am speaking with the correct person using two identifiers. Abigail Moss is currently located at home during visit. The provider, Daryll Drown, NP is located in their office at time of visit.  I discussed the limitations, risks, security and privacy concerns of performing an evaluation and management service by telephone and the availability of in person appointments. I also discussed with the patient that there may be a patient responsible charge related to this service. The patient expressed understanding and agreed to proceed.   History and Present Illness:  Sinusitis This is a recurrent problem. Episode onset: Last 24 to 48 hours. The problem has been gradually worsening since onset. The maximum temperature recorded prior to her arrival was 100.4 - 100.9 F. The fever has been present for Less than 1 day. Associated symptoms include chills, congestion, ear pain, headaches and sinus pressure.     Review of Systems  Constitutional:  Positive for chills.  HENT:  Positive for congestion, ear pain and sinus pressure.   Neurological:  Positive for headaches.  All other systems reviewed and are negative.   Observations/Objective:  Televisit patient is not in distress. Assessment and Plan:   Acute recurrent frontal sinusitis Recurrent sinusitis.  Provided education to increase hydration, take medication as prescribed, Augmentin 875-125 mg tablet by mouth daily   Tylenol for for headache/fever, prednisone taper.  Rx sent to pharmacy.  Follow-up with worsening or unresolved symptoms.   Follow Up Instructions: Follow-up with unresolved symptoms.    I discussed the assessment and treatment plan with the patient. The patient was provided an opportunity to ask questions and all were answered. The patient agreed with the plan and demonstrated an understanding of the instructions.   The patient was advised to call back or seek an in-person evaluation if the symptoms worsen or if the condition fails to improve as anticipated.  The above assessment and management plan was discussed with the patient. The patient verbalized understanding of and has agreed to the management plan. Patient is aware to call the clinic if symptoms persist or worsen. Patient is aware when to return to the clinic for a follow-up visit. Patient educated on when it is appropriate to go to the emergency department.   Time call ended: 9:12 AM  I provided 10 minutes of  non face-to-face time during this encounter.    Daryll Drown, NP

## 2021-01-08 ENCOUNTER — Ambulatory Visit (INDEPENDENT_AMBULATORY_CARE_PROVIDER_SITE_OTHER): Payer: Medicare Other | Admitting: Physician Assistant

## 2021-01-08 ENCOUNTER — Encounter: Payer: Self-pay | Admitting: Physician Assistant

## 2021-01-08 DIAGNOSIS — U071 COVID-19: Secondary | ICD-10-CM

## 2021-01-08 MED ORDER — NIRMATRELVIR/RITONAVIR (PAXLOVID)TABLET: 3.0000 | ORAL_TABLET | Freq: Two times a day (BID) | ORAL | 0 refills | Status: AC

## 2021-01-08 NOTE — Progress Notes (Signed)
   Virtual Visit  Note Due to COVID-19 pandemic this visit was conducted virtually. This visit type was conducted due to national recommendations for restrictions regarding the COVID-19 Pandemic (e.g. social distancing, sheltering in place) in an effort to limit this patient's exposure and mitigate transmission in our community. All issues noted in this document were discussed and addressed.  A physical exam was not performed with this format.  I connected with Abigail Moss on 01/08/21 by telephone and verified that I am speaking with the correct person using two identifiers. Abigail Moss is currently located at home and  is currently with no one during visit. The provider, Caren Macadam, PA-C is located in their office at time of visit.  I discussed the limitations, risks, security and privacy concerns of performing an evaluation and management service by telephone and the availability of in person appointments. I also discussed with the patient that there may be a patient responsible charge related to this service. The patient expressed understanding and agreed to proceed.   History and Present Illness:  Pt with onset of congestion, fatigue, cough, and HA since Sat Took home COVID test that was positive Has hx of asthma but denies any SOB or wheezing No current use of inhaler required + vacc hx - Moderna + COVID hx in Jan       Review of Systems  Constitutional:  Positive for malaise/fatigue. Negative for chills and fever.  HENT:  Positive for congestion.   Respiratory:  Positive for cough and sputum production. Negative for shortness of breath and wheezing.   Cardiovascular: Negative.   Neurological:  Positive for headaches.    Observations/Objective: Defer  Assessment and Plan: COVID  Fluids Rest Paxlovid rx sent in Pt aware of SE and had recent nl renal profile F/U prn   Follow Up Instructions: prn    I discussed the assessment and treatment plan with the  patient. The patient was provided an opportunity to ask questions and all were answered. The patient agreed with the plan and demonstrated an understanding of the instructions.   The patient was advised to call back or seek an in-person evaluation if the symptoms worsen or if the condition fails to improve as anticipated.  The above assessment and management plan was discussed with the patient. The patient verbalized understanding of and has agreed to the management plan. Patient is aware to call the clinic if symptoms persist or worsen. Patient is aware when to return to the clinic for a follow-up visit. Patient educated on when it is appropriate to go to the emergency department.   Time call ended:    I provided 11 minutes of  non face-to-face time during this encounter.    Caren Macadam, PA-C

## 2021-01-08 NOTE — Patient Instructions (Signed)
COVID-19: Quarantine and Isolation Quarantine If you were exposed Quarantine and stay away from others when you have been in close contact with someone whohas COVID-19. Isolate If you are sick or test positive Isolate when you are sick or when you have COVID-19, even if you don't have symptoms. When to stay home Calculating quarantine The date of your exposure is considered day 0. Day 1 is the first full day after your last contact with a person who has had COVID-19. Stay home and away from other people for at least 5 days. Learn why CDC updated guidance for the general public. IF YOU were exposed to COVID-19 and are NOT up-to-date IF YOU were exposed to COVID-19 and are NOT on COVID-19 vaccinations Quarantine for at least 5 days Stay home Stay home and quarantine for at least 5 full days. Wear a well-fitted mask if you must be around others in your home. Do not travel. Get tested Even if you don't develop symptoms, get tested at least 5 days after you last had close contact with someone with COVID-19. After quarantine Watch for symptoms Watch for symptoms until 10 days after you last had close contact with someone with COVID-19. Avoid travel It is best to avoid travel until a full 10 days after you last had close contact with someone with COVID-19. If you develop symptoms Isolate immediately and get tested. Continue to stay home until you know the results. Wear a well-fitted mask around others. Take precautions until day 10 Wear a mask Wear a well-fitted mask for 10 full days any time you are around others inside your home or in public. Do not go to places where you are unable to wear a mask. If you must travel during days 6-10, take precautions. Avoid being around people who are at high risk IF YOU were exposed to COVID-19 and are up-to-date IF YOU were exposed to COVID-19 and are on COVID-19 vaccinations No quarantine You do not need to stay home unless you develop  symptoms. Get tested Even if you don't develop symptoms, get tested at least 5 days after you last had close contact with someone with COVID-19. Watch for symptoms Watch for symptoms until 10 days after you last had close contact with someone with COVID-19. If you develop symptoms Isolate immediately and get tested. Continue to stay home until you know the results. Wear a well-fitted mask around others. Take precautions until day 10 Wear a mask Wear a well-fitted mask for 10 full days any time you are around others inside your home or in public. Do not go to places where you are unable to wear a mask. Take precautions if traveling Avoid being around people who are at high risk IF YOU were exposed to COVID-19 and had confirmed COVID-19 within the past 90 days (you tested positive using a viral test) No quarantine You do not need to stay home unless you develop symptoms. Watch for symptoms Watch for symptoms until 10 days after you last had close contact with someone with COVID-19. If you develop symptoms Isolate immediately and get tested. Continue to stay home until you know the results. Wear a well-fitted mask around others. Take precautions until day 10 Wear a mask Wear a well-fitted mask for 10 full days any time you are around others inside your home or in public. Do not go to places where you are unable to wear a mask. Take precautions if traveling Avoid being around people who are at high risk Calculating isolation   Day 0 is your first day of symptoms or a positive viral test. Day 1 is the first full day after your symptoms developed or your test specimen was collected. If you have COVID-19 or have symptoms, isolate for at least 5 days. IF YOU tested positive for COVID-19 or have symptoms, regardless of vaccination status Stay home for at least 5 days Stay home for 5 days and isolate from others in your home. Wear a well-fitted mask if you must be around others in your home. Do not  travel. Ending isolation if you had symptoms End isolation after 5 full days if you are fever-free for 24 hours (without the use of fever-reducing medication) and your symptoms are improving. Ending isolation if you did NOT have symptoms End isolation after at least 5 full days after your positive test. If you were severely ill with COVID-19 or are immunocompromised You should isolate for at least 10 days. Consult your doctor before ending isolation. Take precautions until day 10 Wear a mask Wear a well-fitted mask for 10 full days any time you are around others inside your home or in public. Do not go to places where you are unable to wear a mask. Do not travel Do not travel until a full 10 days after your symptoms started or the date your positive test was taken if you had no symptoms. Avoid being around people who are at high risk Definitions Exposure Contact with someone infected with SARS-CoV-2, the virus that causes COVID-19,in a way that increases the likelihood of getting infected with the virus. Close contact A close contact is someone who was less than 6 feet away from an infected person (laboratory-confirmed or a clinical diagnosis) for a cumulative total of 15 minutes or more over a 24-hour period. For example, three individual 5-minute exposures for a total of 15 minutes. People who are exposed to someone with COVID-19 after they completed at least 5 days of isolation are notconsidered close contacts. Quarantine Quarantine is a strategy used to prevent transmission of COVID-19 by keeping people who have been in close contact with someone with COVID-19 apart from others. Who does not need to quarantine? If you had close contact with someone with COVID-19 and you are in one of the following groups, you do not need to quarantine. You are up to date with your COVID-19 vaccines. You had confirmed COVID-19 within the last 90 days (meaning you tested positive using a viral test). You  should wear a well-fitting mask around others for 10 days from the date of your last close contact with someone with COVID-19 (the date of last close contact is considered day 0). Get tested at least 5 days after you last had close contact with someone with COVID-19. If you test positive or develop COVID-19 symptoms, isolate from other people and follow recommendations in the Isolation section below. If you tested positive for COVID-19 with a viral test within the previous 90 days and subsequently recovered and remain without COVID-19 symptoms, you do not need to quarantine or get tested after close contact. You should wear a well-fitting mask around others for 10 days from the date of your last close contact withsomeone with COVID-19 (the date of last close contact is considered day 0). Who should quarantine? If you come into close contact with someone with COVID-19, you should quarantine if you are not up to date on COVID-19 vaccines. This includes people who are not vaccinated. What to do for quarantine Stay home and away   from other people for at least 5 days (day 0 through day 5) after your last contact with a person who has COVID-19. The date of your exposure is considered day 0. Wear a well-fitting mask when around others at home, if possible. For 10 days after your last close contact with someone with COVID-19, watch for fever (100.4F or greater), cough, shortness of breath, or other COVID-19 symptoms. If you develop symptoms, get tested immediately and isolate until you receive your test results. If you test positive, follow isolation recommendations. If you do not develop symptoms, get tested at least 5 days after you last had close contact with someone with COVID-19. If you test negative, you can leave your home, but continue to wear a well-fitting mask when around others at home and in public until 10 days after your last close contact with someone with COVID-19. If you test positive, you should  isolate for at least 5 days from the date of your positive test (if you do not have symptoms). If you do develop COVID-19 symptoms, isolate for at least 5 days from the date your symptoms began (the date the symptoms started is day 0). Follow recommendations in the isolation section below. If you are unable to get a test 5 days after last close contact with someone with COVID-19, you can leave your home after day 5 if you have been without COVID-19 symptoms throughout the 5-day period. Wear a well-fitting mask for 10 days after your date of last close contact when around others at home and in public. Avoid people who are immunocompromised or at high risk for severe disease, and nursing homes and other high-risk settings, until after at least 10 days. If possible, stay away from people you live with, especially people who are at higher risk for getting very sick from COVID-19, as well as others outside your home throughout the full 10 days after your last close contact with someone with COVID-19. If you are unable to quarantine, you should wear a well-fitting mask for 10 days when around others at home and in public. If you are unable to wear a mask when around others, you should continue to quarantine for 10 days. Avoid people who are immunocompromised or at high risk for severe disease, and nursing homes and other high-risk settings, until after at least 10 days. See additional information about travel. Do not go to places where you are unable to wear a mask, such as restaurants and some gyms, and avoid eating around others at home and at work until after 10 days after your last close contact with someone with COVID-19. After quarantine Watch for symptoms until 10 days after your last close contact with someone with COVID-19. If you have symptoms, isolate immediately and get tested. Quarantine in high-risk congregate settings In certain congregate settings that have high risk of secondary transmission  (such as correctional and detention facilities, homeless shelters, or cruise ships), CDC recommends a 10-day quarantine for residents, regardless of vaccination and booster status. During periods of critical staffing shortages, facilities may consider shortening the quarantine period for staff to ensure continuity of operations. Decisions to shorten quarantine in these settings should be made in consultation with state, local, tribal, or territorial health departments and should take into consideration the context and characteristics of the facility. CDC's setting-specific guidance provides additional recommendations for these settings. Isolation Isolation is used to separate people with confirmed or suspected COVID-19 from those without COVID-19. People who are in isolation should stay   home until it's safe for them to be around others. At home, anyone sick or infected should separate from others, or wear a well-fitting mask when they need to be around others. People in isolation should stay in a specific "sick room" or area and use a separate bathroom if available. Everyone who has presumed or confirmed COVID-19 should stay home and isolate from other people for at least 5 full days (day 0 is the first day of symptoms or the date of the day of the positive viral test for asymptomatic persons). They should wear a mask when around others at home and in public for an additional 5 days. People who are confirmed to have COVID-19 or are showing symptoms of COVID-19 need to isolate regardless of their vaccination status. This includes: People who have a positive viral test for COVID-19, regardless of whether or not they have symptoms. People with symptoms of COVID-19, including people who are awaiting test results or have not been tested. People with symptoms should isolate even if they do not know if they have been in close contact with someone with COVID-19. What to do for isolation Monitor your symptoms. If you  have an emergency warning sign (including trouble breathing), seek emergency medical care immediately. Stay in a separate room from other household members, if possible. Use a separate bathroom, if possible. Take steps to improve ventilation at home, if possible. Avoid contact with other members of the household and pets. Don't share personal household items, like cups, towels, and utensils. Wear a well-fitting mask when you need to be around other people. Learn more about what to do if you are sick and how to notify your contacts. Ending isolation for people who had COVID-19 and had symptoms If you had COVID-19 and had symptoms, isolate for at least 5 days. To calculate your 5-day isolation period, day 0 is your first day of symptoms. Day 1 is the first full day after your symptoms developed. You can leave isolation after 5 full days. You can end isolation after 5 full days if you are fever-free for 24 hours without the use of fever-reducing medication and your other symptoms have improved (Loss of taste and smell may persist for weeks or months after recovery and need not delay the end of isolation). You should continue to wear a well-fitting mask around others at home and in public for 5 additional days (day 6 through day 10) after the end of your 5-day isolation period. If you are unable to wear a mask when around others, you should continue to isolate for a full 10 days. Avoid people who are immunocompromised or at high risk for severe disease, and nursing homes and other high-risk settings, until after at least 10 days. If you continue to have fever or your other symptoms have not improved after 5 days of isolation, you should wait to end your isolation until you are fever-free for 24 hours without the use of fever-reducing medication and your other symptoms have improved. Continue to wear a well-fitting mask. Contact your healthcare provider if you have questions. See additional information about  travel. Do not go to places where you are unable to wear a mask, such as restaurants and some gyms, and avoid eating around others at home and at work until a full 10 days after your first day of symptoms. If an individual has access to a test and wants to test, the best approach is to use an antigen test1 towards the end of   the 5-day isolation period. Collect the test sample only if you are fever-free for 24 hours without the use of fever-reducing medication and your other symptoms have improved (loss of taste and smell may persist for weeks or months after recovery and need not delay the end of isolation). If your test result is positive, you should continue to isolate until day 10. If your test result is negative, you can end isolation, but continue to wear a well-fitting mask around others at home and in public until day 10. Follow additional recommendations for masking and avoiding travel as described above. 1As noted in the labeling for authorized over-the counter antigen tests: Negative results should be treated as presumptive. Negative results do not rule out SARS-CoV-2 infection and should not be used as the sole basis for treatment or patient management decisions, including infection control decisions. To improve results, antigen tests should be used twice over a three-day period with at least 24 hours and no more than 48 hours between tests. Note that these recommendations on ending isolation do not apply to people with moderate or severe COVID-19 or with weakened immune systems (immunocompromised). See section below for recommendations for when toend isolation for these groups. Ending isolation for people who tested positive for COVID-19 but had no symptoms If you test positive for COVID-19 and never develop symptoms, isolate for at least 5 days. Day 0 is the day of your positive viral test (based on the date you were tested) and day 1 is the first full day after the specimen was collected for your  positive test. You can leave isolation after 5 full days. If you continue to have no symptoms, you can end isolation after at least 5 days. You should continue to wear a well-fitting mask around others at home and in public until day 10 (day 6 through day 10). If you are unable to wear a mask when around others, you should continue to isolate for 10 days. Avoid people who are immunocompromised or at high risk for severe disease, and nursing homes and other high-risk settings, until after at least 10 days. If you develop symptoms after testing positive, your 5-day isolation period should start over. Day 0 is your first day of symptoms. Follow the recommendations above for ending isolation for people who had COVID-19 and had symptoms. See additional information about travel. Do not go to places where you are unable to wear a mask, such as restaurants and some gyms, and avoid eating around others at home and at work until 10 days after the day of your positive test. If an individual has access to a test and wants to test, the best approach is to use an antigen test1 towards the end of the 5-day isolation period. If your test result is positive, you should continue to isolate until day 10. If your test result is negative, you can end isolation, but continue to wear a well-fitting mask around others at home and in public until day 10. Follow additionalrecommendations for masking and avoiding travel as described above. 1As noted in the labeling for authorized over-the counter antigen tests: Negative results should be treated as presumptive. Negative results do not rule out SARS-CoV-2 infection and should not be used as the sole basis for treatment or patient management decisions, including infection control decisions. To improve results, antigen tests should be used twice over a three-day period with at least 24 hours and no more than 48 hours between tests. Ending isolation for people who were   severely ill with  COVID-19 or have a weakened immune system (immunocompromised) People who are severely ill with COVID-19 (including those who were hospitalized or required intensive care or ventilation support) and people with compromised immune systems might need to isolate at home longer. They may also require testing with a viral test to determine when they can be around others. CDC recommends an isolation period of at least 10 and up to 20 days for people who were severely ill with COVID-19 and for people with weakened immune systems. Consult with your healthcare provider about when you can resume being aroundother people. People who are immunocompromised should talk to their healthcare provider about the potential for reduced immune responses to COVID-19 vaccines and the need to continue to follow current prevention measures (including wearing a well-fitting mask, staying 6 feet apart from others they don't live with, and avoiding crowds and poorly ventilated indoor spaces) to protect themselves against COVID-19 until advised otherwise by their healthcare provider. Close contacts of immunocompromised people--including household members--should also be encouraged to receive all recommended COVID-19 vaccine doses to help protect these people. Isolation in high-risk congregate settings In certain high-risk congregate settings that have high risk of secondary transmission and where it is not feasible to cohort people (such as correctional and detention facilities, homeless shelters, and cruise ships), CDC recommends a 10-day isolation period for residents. During periods of critical staffing shortages, facilities may consider shortening the isolation period for staff to ensure continuity of operations. Decisions to shorten isolation in these settings should be made in consultation with state, local, tribal, or territorial health departments and should take into consideration the context and characteristics of the facility.  CDC's setting-specific guidance provides additional recommendations for these settings. This CDC guidance is meant to supplement--not replace--any federal, state, local,territorial, or tribal health and safety laws, rules, and regulations. Recommendations for specific settings These recommendations do not apply to healthcare professionals. For guidance specific to these settings, see Healthcare professionals: Interim Guidance for Managing Healthcare Personnel with SARS-CoV-2 Infection or Exposure to SARS-CoV-2 Patients, residents, and visitors to healthcare settings: Interim Infection Prevention and Control Recommendations for Healthcare Personnel During the Coronavirus Disease 2019 (COVID-19) Pandemic Additional setting-specific guidance and recommendations are available. These recommendations on quarantine and isolation do apply to K-12 School settings. Additional guidance is available here: Overview of COVID-19 Quarantine for K-12 Schools Travelers: Travel information and recommendations Congregate facilities and other settings: guidance pages for community, work, and school settings Ongoing COVID-19 exposure FAQs I live with someone with COVID-19, but I cannot be separated from them. How do we manage quarantine in this situation? It is very important for people with COVID-19 to remain apart from other people, if possible, even if they are living together. If separation of the person with COVID-19 from others that they live with is not possible, the other people that they live with will have ongoing exposure, meaning they will be repeatedly exposed until that person is no longer able to spread the virus to other people. In this situation, there are precautions you can take to limit the spread of COVID-19: The person with COVID-19 and everyone they live with should wear a well-fitting mask inside the home. If possible, one person should care for the person with COVID-19 to limit the number of people  who are in close contact with the infected person. Take steps to protect yourself and others to reduce transmission in the home: Quarantine if you are not up to date with your COVID-19   vaccines. Isolate if you are sick or tested positive for COVID-19, even if you don't have symptoms. Learn more about the public health recommendations for testing, mask use and quarantine of close contacts, like yourself, who have ongoing exposure. These recommendations differ depending on your vaccination status. What should I do if I have ongoing exposure to COVID-19 from someone I live with? Recommendations for this situation depend on your vaccination status: If you are not up to date on COVID-19 vaccines and have ongoing exposure to COVID-19, you should: Begin quarantine immediately and continue to quarantine throughout the isolation period of the person with COVID-19. Continue to quarantine for an additional 5 days starting the day after the end of isolation for the person with COVID-19. Get tested at least 5 days after the end of isolation of the infected person that lives with them. If you test negative, you can leave the home but should continue to wear a well-fitting mask when around others at home and in public until 10 days after the end of isolation for the person with COVID-19. Isolate immediately if you develop symptoms of COVID-19 or test positive. If you are up to date with COVID-19 vaccines and have ongoing exposure to COVID-19, you should: Get tested at least 5 days after your first exposure. A person with COVID-19 is considered infectious starting 2 days before they develop symptoms, or 2 days before the date of their positive test if they do not have symptoms. Get tested again at least 5 days after the end of isolation for the person with COVID-19. Wear a well-fitting mask when you are around the person with COVID-19, and do this throughout their isolation period. Wear a well-fitting mask around  others for 10 days after the infected person's isolation period ends. Isolate immediately if you develop symptoms of COVID-19 or test positive. What should I do if multiple people I live with test positive for COVID-19 at different times? Recommendations for this situation depend on your vaccination status: If you are not up to date with your COVID-19 vaccines, you should: Quarantine throughout the isolation period of any infected person that you live with. Continue to quarantine until 5 days after the end of isolation date for the most recently infected person that lives with you. For example, if the last day of isolation of the person most recently infected with COVID-19 was June 30, the new 5-day quarantine period starts on July 1. Get tested at least 5 days after the end of isolation for the most recently infected person that lives with you. Wear a well-fitting mask when you are around any person with COVID-19 while that person is in isolation. Wear a well-fitting mask when you are around other people until 10 days after your last close contact. Isolate immediately if you develop symptoms of COVID-19 or test positive. If you are up to date with COVID-19 your vaccines , you should: Get tested at least 5 days after your first exposure. A person with COVID-19 is considered infectious starting 2 days before they developed symptoms, or 2 days before the date of their positive test if they do not have symptoms. Get tested again at least 5 days after the end of isolation for the most recently infected person that lives with you. Wear a well-fitting mask when you are around any person with COVID-19 while that person is in isolation. Wear a well-fitting mask around others for 10 days after the end of isolation for the most recently infected person that   lives with you. For example, if the last day of isolation for the person most recently infected with COVID-19 was June 30, the new 10-day period to wear a  well-fitting mask indoors in public starts on July 1. Isolate immediately if you develop symptoms of COVID-19 or test positive. I had COVID-19 and completed isolation. Do I have to quarantine or get tested if someone I live with gets COVID-19 shortly after I completed isolation? No. If you recently completed isolation and someone that lives with you tests positive for the virus that causes COVID-19 shortly after the end of your isolation period, you do not have to quarantine or get tested as long as you do not develop new symptoms. Once all of the people that live together have completed isolation or quarantine, refer to the guidance below for new exposures to COVID-19. If you had COVID-19 in the previous 90 days and then came into close contact with someone with COVID-19, you do not have to quarantine or get tested if you do not have symptoms. But you should: Wear a well-fitting mask indoors in public for 10 days after exposure. Monitor for COVID-19 symptoms and isolate immediately if symptoms develop. Consult with a healthcare provider for testing recommendations if new symptoms develop. If more than 90 days have passed since your recovery from infection, follow CDC's recommendations for close contacts. These recommendations will differ depending on your vaccination status. 08/02/2020 Content source: National Center for Immunization and Respiratory Diseases (NCIRD), Division of Viral Diseases This information is not intended to replace advice given to you by your health care provider. Make sure you discuss any questions you have with your healthcare provider. Document Revised: 08/10/2020 Document Reviewed: 08/10/2020 Elsevier Patient Education  2022 Elsevier Inc.  

## 2021-02-04 ENCOUNTER — Other Ambulatory Visit: Payer: Self-pay

## 2021-02-04 ENCOUNTER — Ambulatory Visit (INDEPENDENT_AMBULATORY_CARE_PROVIDER_SITE_OTHER): Payer: Medicare Other | Admitting: Family

## 2021-02-04 ENCOUNTER — Encounter: Payer: Self-pay | Admitting: Family

## 2021-02-04 VITALS — BP 132/88 | HR 81 | Temp 97.1°F | Ht 66.0 in | Wt 174.0 lb

## 2021-02-04 DIAGNOSIS — R630 Anorexia: Secondary | ICD-10-CM

## 2021-02-04 DIAGNOSIS — Z8616 Personal history of COVID-19: Secondary | ICD-10-CM | POA: Diagnosis not present

## 2021-02-04 DIAGNOSIS — R42 Dizziness and giddiness: Secondary | ICD-10-CM | POA: Diagnosis not present

## 2021-02-04 DIAGNOSIS — E039 Hypothyroidism, unspecified: Secondary | ICD-10-CM | POA: Diagnosis not present

## 2021-02-04 DIAGNOSIS — R5383 Other fatigue: Secondary | ICD-10-CM | POA: Diagnosis not present

## 2021-02-04 DIAGNOSIS — R634 Abnormal weight loss: Secondary | ICD-10-CM

## 2021-02-04 LAB — URINALYSIS, COMPLETE
Bilirubin, UA: NEGATIVE
Glucose, UA: NEGATIVE
Leukocytes,UA: NEGATIVE
Nitrite, UA: NEGATIVE
Protein,UA: NEGATIVE
RBC, UA: NEGATIVE
Specific Gravity, UA: 1.025 (ref 1.005–1.030)
Urobilinogen, Ur: 0.2 mg/dL (ref 0.2–1.0)
pH, UA: 5.5 (ref 5.0–7.5)

## 2021-02-04 LAB — MICROSCOPIC EXAMINATION
Bacteria, UA: NONE SEEN
Epithelial Cells (non renal): NONE SEEN /hpf (ref 0–10)
RBC, Urine: NONE SEEN /hpf (ref 0–2)
WBC, UA: NONE SEEN /hpf (ref 0–5)

## 2021-02-04 NOTE — Patient Instructions (Signed)

## 2021-02-04 NOTE — Progress Notes (Signed)
Subjective:    Patient ID: Abigail Moss, female    DOB: [birth year], 75 y.o.   MRN: [id number]  Chief Complaint  Patient presents with   Weight Loss    Patient states she has no appetite  drinks a boost     HPI She reports over the last 3-4 weeks she has had decrease appetite, fatigue, dizziness. She has lost 10 lbs in the last month.  Reports she had COVID a couple of months ago and since has had a change in her taste buds.   She is taking boost daily.   She has hypothyroidism and taking levothyroxine 125 mcg daily.   Review of Systems  Constitutional:  Positive for fatigue.  Neurological:  Positive for dizziness.  All other systems reviewed and are negative.     Objective:   Physical Exam Vitals reviewed.  Constitutional:      General: She is not in acute distress.    Appearance: She is well-developed.  HENT:     Head: Normocephalic and atraumatic.     Right Ear: Tympanic membrane normal.     Left Ear: Tympanic membrane normal.  Eyes:     Pupils: Pupils are equal, round, and reactive to light.  Neck:     Thyroid: No thyromegaly.  Cardiovascular:     Rate and Rhythm: Normal rate and regular rhythm.     Heart sounds: Normal heart sounds. No murmur heard. Pulmonary:     Effort: Pulmonary effort is normal. No respiratory distress.     Breath sounds: Normal breath sounds. No wheezing.  Abdominal:     General: Bowel sounds are normal. There is no distension.     Palpations: Abdomen is soft.     Tenderness: There is no abdominal tenderness.  Musculoskeletal:        General: No tenderness. Normal range of motion.     Cervical back: Normal range of motion and neck supple.  Skin:    General: Skin is warm and dry.  Neurological:     Mental Status: She is alert and oriented to person, place, and time.     Cranial Nerves: No cranial nerve deficit.     Deep Tendon Reflexes: Reflexes are normal and symmetric.  Psychiatric:        Behavior: Behavior normal.         Thought Content: Thought content normal.        Judgment: Judgment normal.      BP 132/88   Pulse 81   Temp (!) 97.1 F (36.2 C) (Temporal)   Ht '5\' 6"'  (1.676 m)   Wt 174 lb (78.9 kg)   SpO2 94%   BMI 28.08 kg/m      Assessment & Plan:  Abigail Moss comes in today with chief complaint of Weight Loss (Patient states she has no appetite  drinks a boost )   Diagnosis and orders addressed:  1. Decreased appetite - Anemia Profile B - CMP14+EGFR - Urinalysis, Complete - Urine Culture  2. Fatigue, unspecified type - Anemia Profile B - CMP14+EGFR - Urinalysis, Complete - Urine Culture  3. Dizziness - Anemia Profile B - CMP14+EGFR - Urinalysis, Complete - Urine Culture  4. History of COVID-19 - Anemia Profile B - CMP14+EGFR - Urinalysis, Complete - Urine Culture  5. Weight loss, unintentional - Anemia Profile B - CMP14+EGFR - Urinalysis, Complete - Urine Culture  6. Hypothyroidism, unspecified type - TSH   Labs pending Health Maintenance reviewed Diet and exercise encouraged  Follow up plan: Keep chronic follow up   [name], FNP

## 2021-02-05 LAB — ANEMIA PROFILE B
Basophils Absolute: 0 10*3/uL (ref 0.0–0.2)
Basos: 1 %
EOS (ABSOLUTE): 0.2 10*3/uL (ref 0.0–0.4)
Eos: 5 %
Ferritin: 127 ng/mL (ref 15–150)
Folate: >20 ng/mL (ref >3.0)
Hematocrit: 36.4 % (ref 34.0–46.6)
Hemoglobin: 12.1 g/dL (ref 11.1–15.9)
Immature Grans (Abs): 0 10*3/uL (ref 0.0–0.1)
Immature Granulocytes: 0 %
Iron Saturation: 37 % (ref 15–55)
Iron: 87 ug/dL (ref 27–139)
Lymphocytes Absolute: 1.4 10*3/uL (ref 0.7–3.1)
Lymphs: 32 %
MCH: 31.8 pg (ref 26.6–33.0)
MCHC: 33.2 g/dL (ref 31.5–35.7)
MCV: 96 fL (ref 79–97)
Monocytes Absolute: 0.5 10*3/uL (ref 0.1–0.9)
Monocytes: 11 %
Neutrophils Absolute: 2.3 10*3/uL (ref 1.4–7.0)
Neutrophils: 51 %
Platelets: 228 10*3/uL (ref 150–450)
RBC: 3.8 x10E6/uL (ref 3.77–5.28)
RDW: 13 % (ref 11.7–15.4)
Retic Ct Pct: 0.9 % (ref 0.6–2.6)
Total Iron Binding Capacity: 237 ug/dL — ABNORMAL LOW (ref 250–450)
UIBC: 150 ug/dL (ref 118–369)
Vitamin B-12: 644 pg/mL (ref 232–1245)
WBC: 4.4 10*3/uL (ref 3.4–10.8)

## 2021-02-05 LAB — CMP14+EGFR
ALT: 24 IU/L (ref 0–32)
AST: 23 IU/L (ref 0–40)
Albumin/Globulin Ratio: 1.8 (ref 1.2–2.2)
Albumin: 4.2 g/dL (ref 3.7–4.7)
Alkaline Phosphatase: 64 IU/L (ref 44–121)
BUN/Creatinine Ratio: 21 (ref 12–28)
BUN: 21 mg/dL (ref 8–27)
Bilirubin Total: 0.3 mg/dL (ref 0.0–1.2)
CO2: 23 mmol/L (ref 20–29)
Calcium: 9.1 mg/dL (ref 8.7–10.3)
Chloride: 103 mmol/L (ref 96–106)
Creatinine, Ser: 1 mg/dL (ref 0.57–1.00)
Globulin, Total: 2.3 g/dL (ref 1.5–4.5)
Glucose: 89 mg/dL (ref 65–99)
Potassium: 4.4 mmol/L (ref 3.5–5.2)
Sodium: 141 mmol/L (ref 134–144)
Total Protein: 6.5 g/dL (ref 6.0–8.5)
eGFR: 58 mL/min/{1.73_m2} — ABNORMAL LOW (ref >59)

## 2021-02-05 LAB — URINE CULTURE

## 2021-02-05 LAB — TSH: TSH: 2.53 u[IU]/mL (ref 0.450–4.500)

## 2021-02-06 NOTE — Progress Notes (Signed)
R/C about labs   

## 2021-02-21 ENCOUNTER — Ambulatory Visit (INDEPENDENT_AMBULATORY_CARE_PROVIDER_SITE_OTHER): Payer: Medicare Other

## 2021-02-21 ENCOUNTER — Other Ambulatory Visit: Payer: Self-pay

## 2021-02-21 VITALS — Ht 66.0 in | Wt 174.0 lb

## 2021-02-21 DIAGNOSIS — Z0000 Encounter for general adult medical examination without abnormal findings: Secondary | ICD-10-CM

## 2021-02-21 DIAGNOSIS — Z1231 Encounter for screening mammogram for malignant neoplasm of breast: Secondary | ICD-10-CM | POA: Diagnosis not present

## 2021-02-21 MED ORDER — DICLOFENAC SODIUM 75 MG PO TBEC: 75.0000 mg | DELAYED_RELEASE_TABLET | Freq: Two times a day (BID) | ORAL | 1 refills | Status: DC | PRN

## 2021-02-21 NOTE — Patient Instructions (Signed)
Ms. Goyal , Thank you for taking time to come for your Medicare Wellness Visit. I appreciate your ongoing commitment to your health goals. Please review the following plan we discussed and let me know if I can assist you in the future.   Screening recommendations/referrals: Colonoscopy: No longer required  Mammogram: Done 02/29/2020 Repeat annually Bone Density: Done 04/11/2020 Repeat every 2 years Recommended yearly ophthalmology/optometry visit for glaucoma screening and checkup Recommended yearly dental visit for hygiene and checkup  Vaccinations: Influenza vaccine: 03/28/2020 Repeat annually Pneumococcal vaccine: Prevnar 11/21/14, Pnemovax 01/02/11  Tdap vaccine: Done 12/12/2019 Repeat in 10 years Shingles vaccine: Shingrix discussed. Please contact your pharmacy for coverage information.    Covid-19:Done 08/27/19, 07/28/19. Declines booster.  Advanced directives: Advance directive discussed with you today. Even though you declined this today, please call our office should you change your mind, and we can give you the proper paperwork for you to fill out.  Conditions/risks identified: Aim for 30 minutes of exercise or brisk walking each day, drink 6-8 glasses of water and eat lots of fruits and vegetables.  Next appointment: Follow up in one year for your annual wellness vist:    Preventive Care 65 Years and Older, Female Preventive care refers to lifestyle choices and visits with your health care provider that can promote health and wellness. What does preventive care include? A yearly physical exam. This is also called an annual well check. Dental exams once or twice a year. Routine eye exams. Ask your health care provider how often you should have your eyes checked. Personal lifestyle choices, including: Daily care of your teeth and gums. Regular physical activity. Eating a healthy diet. Avoiding tobacco and drug use. Limiting alcohol use. Practicing safe sex. Taking low-dose  aspirin every day. Taking vitamin and mineral supplements as recommended by your health care provider. What happens during an annual well check? The services and screenings done by your health care provider during your annual well check will depend on your age, overall health, lifestyle risk factors, and family history of disease. Counseling  Your health care provider may ask you questions about your: Alcohol use. Tobacco use. Drug use. Emotional well-being. Home and relationship well-being. Sexual activity. Eating habits. History of falls. Memory and ability to understand (cognition). Work and work Astronomer. Reproductive health. Screening  You may have the following tests or measurements: Height, weight, and BMI. Blood pressure. Lipid and cholesterol levels. These may be checked every 5 years, or more frequently if you are over 67 years old. Skin check. Lung cancer screening. You may have this screening every year starting at age 57 if you have a 30-pack-year history of smoking and currently smoke or have quit within the past 15 years. Fecal occult blood test (FOBT) of the stool. You may have this test every year starting at age 18. Flexible sigmoidoscopy or colonoscopy. You may have a sigmoidoscopy every 5 years or a colonoscopy every 10 years starting at age 80. Hepatitis C blood test. Hepatitis B blood test. Sexually transmitted disease (STD) testing. Diabetes screening. This is done by checking your blood sugar (glucose) after you have not eaten for a while (fasting). You may have this done every 1-3 years. Bone density scan. This is done to screen for osteoporosis. You may have this done starting at age 21. Mammogram. This may be done every 1-2 years. Talk to your health care provider about how often you should have regular mammograms. Talk with your health care provider about your test results,  treatment options, and if necessary, the need for more tests. Vaccines  Your  health care provider may recommend certain vaccines, such as: Influenza vaccine. This is recommended every year. Tetanus, diphtheria, and acellular pertussis (Tdap, Td) vaccine. You may need a Td booster every 10 years. Zoster vaccine. You may need this after age 34. Pneumococcal 13-valent conjugate (PCV13) vaccine. One dose is recommended after age 47. Pneumococcal polysaccharide (PPSV23) vaccine. One dose is recommended after age 48. Talk to your health care provider about which screenings and vaccines you need and how often you need them. This information is not intended to replace advice given to you by your health care provider. Make sure you discuss any questions you have with your health care provider. Document Released: 07/20/2015 Document Revised: 03/12/2016 Document Reviewed: 04/24/2015 Elsevier Interactive Patient Education  2017 [employer].  Fall Prevention in the Home Falls can cause injuries. They can happen to people of all ages. There are many things you can do to make your home safe and to help prevent falls. What can I do on the outside of my home? Regularly fix the edges of walkways and driveways and fix any cracks. Remove anything that might make you trip as you walk through a door, such as a raised step or threshold. Trim any bushes or trees on the path to your home. Use bright outdoor lighting. Clear any walking paths of anything that might make someone trip, such as rocks or tools. Regularly check to see if handrails are loose or broken. Make sure that both sides of any steps have handrails. Any raised decks and porches should have guardrails on the edges. Have any leaves, snow, or ice cleared regularly. Use sand or salt on walking paths during winter. Clean up any spills in your garage right away. This includes oil or grease spills. What can I do in the bathroom? Use night lights. Install grab bars by the toilet and in the tub and shower. Do not use towel bars as  grab bars. Use non-skid mats or decals in the tub or shower. If you need to sit down in the shower, use a plastic, non-slip stool. Keep the floor dry. Clean up any water that spills on the floor as soon as it happens. Remove soap buildup in the tub or shower regularly. Attach bath mats securely with double-sided non-slip rug tape. Do not have throw rugs and other things on the floor that can make you trip. What can I do in the bedroom? Use night lights. Make sure that you have a light by your bed that is easy to reach. Do not use any sheets or blankets that are too big for your bed. They should not hang down onto the floor. Have a firm chair that has side arms. You can use this for support while you get dressed. Do not have throw rugs and other things on the floor that can make you trip. What can I do in the kitchen? Clean up any spills right away. Avoid walking on wet floors. Keep items that you use a lot in easy-to-reach places. If you need to reach something above you, use a strong step stool that has a grab bar. Keep electrical cords out of the way. Do not use floor polish or wax that makes floors slippery. If you must use wax, use non-skid floor wax. Do not have throw rugs and other things on the floor that can make you trip. What can I do with my stairs? Do  not leave any items on the stairs. Make sure that there are handrails on both sides of the stairs and use them. Fix handrails that are broken or loose. Make sure that handrails are as long as the stairways. Check any carpeting to make sure that it is firmly attached to the stairs. Fix any carpet that is loose or worn. Avoid having throw rugs at the top or bottom of the stairs. If you do have throw rugs, attach them to the floor with carpet tape. Make sure that you have a light switch at the top of the stairs and the bottom of the stairs. If you do not have them, ask someone to add them for you. What else can I do to help prevent  falls? Wear shoes that: Do not have high heels. Have rubber bottoms. Are comfortable and fit you well. Are closed at the toe. Do not wear sandals. If you use a stepladder: Make sure that it is fully opened. Do not climb a closed stepladder. Make sure that both sides of the stepladder are locked into place. Ask someone to hold it for you, if possible. Clearly mark and make sure that you can see: Any grab bars or handrails. First and last steps. Where the edge of each step is. Use tools that help you move around (mobility aids) if they are needed. These include: Canes. Walkers. Scooters. Crutches. Turn on the lights when you go into a dark area. Replace any light bulbs as soon as they burn out. Set up your furniture so you have a clear path. Avoid moving your furniture around. If any of your floors are uneven, fix them. If there are any pets around you, be aware of where they are. Review your medicines with your doctor. Some medicines can make you feel dizzy. This can increase your chance of falling. Ask your doctor what other things that you can do to help prevent falls. This information is not intended to replace advice given to you by your health care provider. Make sure you discuss any questions you have with your health care provider. Document Released: 04/19/2009 Document Revised: 11/29/2015 Document Reviewed: 07/28/2014 Elsevier Interactive Patient Education  2017 [employer].

## 2021-02-21 NOTE — Progress Notes (Signed)
Subjective:   Abigail SprangRosa S Starkel is a 75 y.o. female who presents for Medicare Annual (Subsequent) preventive examination.  Virtual Visit via Telephone Note  I connected with  Abigail Sprangosa S Moss on 02/21/21 at  9:45 AM EDT by telephone and verified that I am speaking with the correct person using two identifiers.  Location: Patient: Home Provider: [facility] Persons participating in the virtual visit: patient/Nurse Health Advisor   I discussed the limitations, risks, security and privacy concerns of performing an evaluation and management service by telephone and the availability of in person appointments. The patient expressed understanding and agreed to proceed.  Interactive audio and video telecommunications were attempted between this nurse and patient, however failed, due to patient having technical difficulties OR patient did not have access to video capability.  We continued and completed visit with audio only.  Some vital signs may be absent or patient reported.   Cereniti Curb E Tytus Strahle, LPN   Review of Systems     Cardiac Risk Factors include: advanced age (>7555men, 40>65 women);sedentary lifestyle;hypertension     Objective:    Today's Vitals   02/21/21 0951  Weight: 174 lb (78.9 kg)  Height: 5\' 6"  (1.676 m)   Body mass index is 28.08 kg/m.  Advanced Directives 02/21/2021 04/30/2020 02/20/2020 02/10/2019 11/21/2014  Does Patient Have a Medical Advance Directive? No No No No No  Would patient like information on creating a medical advance directive? No - Patient declined - No - Patient declined No - Patient declined Yes - Educational materials given    Current Medications (verified) Outpatient Encounter Medications as of 02/21/2021  Medication Sig   albuterol (VENTOLIN HFA) 108 (90 Base) MCG/ACT inhaler TAKE 2 PUFFS BY MOUTH EVERY 6 HOURS AS NEEDED FOR WHEEZE OR SHORTNESS OF BREATH   alendronate (FOSAMAX) 70 MG tablet TAKE 1 TABLET BY MOUTH  WEEKLY WITH 8 OUNCES OF  PLAIN WATER 1/2 HOUR BEFORE  FIRST FOOD DRINK OR MEDS.  STAY UPRIGHT FOR 1/2 HOUR.   aspirin EC 81 MG tablet Take 81 mg by mouth daily.   buPROPion (WELLBUTRIN XL) 150 MG 24 hr tablet TAKE 1 TABLET BY MOUTH  DAILY   Calcium Citrate-Vitamin D (CALCIUM CITRATE + PO) Take 600 mg by mouth daily.   Cholecalciferol (VITAMIN D-3 PO) Take 800 mg by mouth daily.   HYDROcodone-acetaminophen (NORCO) 10-325 MG tablet Take 1 tablet by mouth 3 (three) times daily as needed.   levothyroxine (SYNTHROID) 125 MCG tablet TAKE 1 TABLET BY MOUTH  DAILY BEFORE BREAKFAST   losartan (COZAAR) 100 MG tablet TAKE 1 TABLET BY MOUTH  DAILY   montelukast (SINGULAIR) 10 MG tablet TAKE 1 TABLET BY MOUTH  DAILY AT BEDTIME   MULTIPLE VITAMIN PO Take by mouth.   omeprazole (PRILOSEC) 40 MG capsule TAKE 1 CAPSULE BY MOUTH  DAILY   [DISCONTINUED] diclofenac (VOLTAREN) 75 MG EC tablet Take 75 mg by mouth 2 (two) times daily as needed.   ALPRAZolam (XANAX) 0.5 MG tablet Take 1 tablet (0.5 mg total) by mouth at bedtime as needed. (Patient not taking: Reported on 02/21/2021)   fluticasone (FLONASE) 50 MCG/ACT nasal spray SPRAY 2 SPRAYS INTO EACH NOSTRIL EVERY DAY (Patient not taking: Reported on 02/21/2021)   [DISCONTINUED] acetic acid-hydrocortisone (VOSOL-HC) OTIC solution Place 4 drops into both ears 4 (four) times daily.   No facility-administered encounter medications on file as of 02/21/2021.    Allergies (verified) Baclofen, Elemental sulfur, Gabapentin, Meloxicam, Neomycin, and Latex   History: Past Medical History:  Diagnosis Date   GERD (gastroesophageal reflux disease)    Hypertension    Osteopenia    Thyroid disease    Past Surgical History:  Procedure Laterality Date   EAR TUBE REMOVAL     ROTATOR CUFF REPAIR Left    TONSILLECTOMY     VEIN LIGATION AND STRIPPING     Family History  Problem Relation Age of Onset   Heart disease Mother 4       CHF   Diabetes Father    Heart disease Sister        CHF   Cancer Brother    Cancer  Sister        throat   Stroke Brother    COPD Brother    Social History   Socioeconomic History   Marital status: Married    Spouse name: [name]   Number of children: 2   Years of education: 12   Highest education level: High school graduate  Occupational History   Occupation: retired     Comment: worked at [employer]  Tobacco Use   Smoking status: Former    Types: Cigarettes    Quit date: 07/07/1996    Years since quitting: 24.6   Smokeless tobacco: Never  Vaping Use   Vaping Use: Never used  Substance and Sexual Activity   Alcohol use: No    Alcohol/week: 0.0 standard drinks   Drug use: No   Sexual activity: Not Currently  Other Topics Concern   Not on file  Social History Narrative   Lives at home with husband.  Grandson stays with them a lot.   Children live nearby   Social Determinants of Health   Financial Resource Strain: Low Risk    Difficulty of Paying Living Expenses: Not hard at all  Food Insecurity: No Food Insecurity   Worried About Programme researcher, broadcasting/film/video in the Last Year: Never true   Barista in the Last Year: Never true  Transportation Needs: No Transportation Needs   Lack of Transportation (Medical): No   Lack of Transportation (Non-Medical): No  Physical Activity: Insufficiently Active   Days of Exercise per Week: 7 days   Minutes of Exercise per Session: 20 min  Stress: No Stress Concern Present   Feeling of Stress : Only a little  Social Connections: Moderately Integrated   Frequency of Communication with Friends and Family: More than three times a week   Frequency of Social Gatherings with Friends and Family: More than three times a week   Attends Religious Services: 1 to 4 times per year   Active Member of [employer] or Organizations: No   Attends Engineer, structural: Never   Marital Status: Married    Tobacco Counseling Counseling given: Not Answered   Clinical Intake:  Pre-visit preparation completed: Yes  Pain : No/denies  pain     BMI - recorded: 28.08 Nutritional Status: BMI 25 -29 Overweight Nutritional Risks: None Diabetes: No  How often do you need to have someone help you when you read instructions, pamphlets, or other written materials from your doctor or pharmacy?: 1 - Never  Diabetic? No  Interpreter Needed?: No  Information entered by :: Sonya Pucci, LPN   Activities of Daily Living In your present state of health, do you have any difficulty performing the following activities: 02/21/2021  Hearing? Y  Comment wears hearing aids  Vision? N  Difficulty concentrating or making decisions? N  Walking or climbing stairs? N  Dressing or  bathing? N  Doing errands, shopping? N  Preparing Food and eating ? N  Using the Toilet? N  In the past six months, have you accidently leaked urine? N  Do you have problems with loss of bowel control? N  Managing your Medications? N  Managing your Finances? N  Housekeeping or managing your Housekeeping? N  Some recent data might be hidden    Patient Care Team: Junie Spencer, FNP as PCP - General (Family Medicine) Hart Carwin, MD (Inactive) as Consulting Physician (Gastroenterology) Landry Mellow, AUD (Audiology) Michaelle Copas, MD as Referring Physician (Optometry)  Indicate any recent Medical Services you may have received from other than [facility] providers in the past year (date may be approximate).     Assessment:   This is a routine wellness examination for [place].  Hearing/Vision screen Hearing Screening - Comments:: Wears hearing aids - f/u with [facility] in [place] Vision Screening - Comments:: Wears eyeglasses - up to date with annual eye exams with Dr Conley Rolls in [place]  Dietary issues and exercise activities discussed: Current Exercise Habits: Home exercise routine, Type of exercise: walking, Time (Minutes): 20, Frequency (Times/Week): 7, Weekly Exercise (Minutes/Week): 140, Intensity: Mild, Exercise limited by: orthopedic  condition(s)   Goals Addressed             This Visit's Progress    Exercise 3x per week (30 min per time)   On track    Walk for at least 30 minutes 3 times per week       Depression Screen PHQ 2/9 Scores 02/21/2021 11/19/2020 07/03/2020 03/15/2020 02/20/2020 02/07/2020 01/30/2020  PHQ - 2 Score 0 0 0 0 0 0 0  PHQ- 9 Score - 4 - - - - -    Fall Risk Fall Risk  02/21/2021 11/19/2020 11/19/2020 07/03/2020 03/15/2020  Falls in the past year? 0 0 0 0 0  Number falls in past yr: 0 - - - -  Injury with Fall? 0 - - - -  Comment - - - - -  Risk for fall due to : Orthopedic patient;Impaired vision;Medication side effect - - - -  Follow up Education provided;Falls prevention discussed - - - -    FALL RISK PREVENTION PERTAINING TO THE HOME:  Any stairs in or around the home? Yes  If so, are there any without handrails? No  Home free of loose throw rugs in walkways, pet beds, electrical cords, etc? Yes  Adequate lighting in your home to reduce risk of falls? Yes   ASSISTIVE DEVICES UTILIZED TO PREVENT FALLS:  Life alert? No  Use of a cane, walker or w/c? No  Grab bars in the bathroom? Yes  Shower chair or bench in shower? No  Elevated toilet seat or a handicapped toilet? No   TIMED UP AND GO:  Was the test performed? No . Telephonic visit  Cognitive Function: Normal cognitive status assessed by direct observation by this Nurse Health Advisor. No abnormalities found.    MMSE - Mini Mental State Exam 11/21/2014  Orientation to time 5  Orientation to Place 5  Registration 3  Attention/ Calculation 5  Recall 3  Language- name 2 objects 2  Language- repeat 1  Language- follow 3 step command 3  Language- read & follow direction 1  Write a sentence 1  Copy design 1  Total score 30     6CIT Screen 02/20/2020 02/10/2019  What Year? 0 points 0 points  What month? 0 points 0  points  What time? 0 points 0 points  Count back from 20 0 points 0 points  Months in reverse 0 points 0 points   Repeat phrase 0 points 0 points  Total Score 0 0    Immunizations Immunization History  Administered Date(s) Administered   Fluad Quad(high Dose 65+) 03/28/2020   Influenza, High Dose Seasonal PF 05/22/2018, 04/03/2019, 04/03/2019   Influenza,inj,Quad PF,6+ Mos 04/07/2013   Influenza-Unspecified 03/20/2016, 04/30/2017   Moderna Sars-Covid-2 Vaccination 07/30/2019, 08/27/2019   Pneumococcal Conjugate-13 11/21/2014   Pneumococcal Polysaccharide-23 01/02/2011   Td 04/06/2006   Tdap 12/12/2019    TDAP status: Up to date  Flu Vaccine status: Up to date  Pneumococcal vaccine status: Up to date  Covid-19 vaccine status: Information provided on how to obtain vaccines.   Qualifies for Shingles Vaccine? Yes   Zostavax completed No   Shingrix Completed?: No.    Education has been provided regarding the importance of this vaccine. Patient has been advised to call insurance company to determine out of pocket expense if they have not yet received this vaccine. Advised may also receive vaccine at local pharmacy or Health Dept. Verbalized acceptance and understanding.  Screening Tests Health Maintenance  Topic Date Due   Zoster Vaccines- Shingrix (1 of 2) Never done   COVID-19 Vaccine (3 - Moderna risk series) 09/24/2019   INFLUENZA VACCINE  02/04/2021   DEXA SCAN  04/11/2022   TETANUS/TDAP  12/11/2029   Hepatitis C Screening  Completed   PNA vac Low Risk Adult  Completed   HPV VACCINES  Aged Out    Health Maintenance  Health Maintenance Due  Topic Date Due   Zoster Vaccines- Shingrix (1 of 2) Never done   COVID-19 Vaccine (3 - Moderna risk series) 09/24/2019   INFLUENZA VACCINE  02/04/2021    Colorectal cancer screening: No longer required.   Mammogram status: Ordered 02/21/21. Pt provided with contact info and advised to call to schedule appt.   Bone Density status: Completed 04/11/2020. Results reflect: Bone density results: OSTEOPENIA. Repeat every 2 years.  Lung Cancer  Screening: (Low Dose CT Chest recommended if Age 22-80 years, 30 pack-year currently smoking OR have quit w/in 15years.) does not qualify.   Additional Screening:  Hepatitis C Screening: does qualify; Completed 12/18/15  Vision Screening: Recommended annual ophthalmology exams for early detection of glaucoma and other disorders of the eye. Is the patient up to date with their annual eye exam?  Yes  Who is the provider or what is the name of the office in which the patient attends annual eye exams? Dr. Conley Rolls in [place]   If pt is not established with a provider, would they like to be referred to a provider to establish care? No .   Dental Screening: Recommended annual dental exams for proper oral hygiene  Community Resource Referral / Chronic Care Management: CRR required this visit?  No   CCM required this visit?  No      Plan:     I have personally reviewed and noted the following in the patient's chart:   Medical and social history Use of alcohol, tobacco or illicit drugs  Current medications and supplements including opioid prescriptions.  Functional ability and status Nutritional status Physical activity Advanced directives List of other physicians Hospitalizations, surgeries, and ER visits in previous 12 months Vitals Screenings to include cognitive, depression, and falls Referrals and appointments  In addition, I have reviewed and discussed with patient certain preventive protocols, quality metrics, and best  practice recommendations. A written personalized care plan for preventive services as well as general preventive health recommendations were provided to patient.     [place] Constable, LPN   7/65/4650   Nurse Notes: Pt concerned about hair loss. Pt needs refill on Diclofenac. Note sent to [facility].

## 2021-03-01 ENCOUNTER — Other Ambulatory Visit: Payer: Self-pay

## 2021-03-01 ENCOUNTER — Ambulatory Visit (INDEPENDENT_AMBULATORY_CARE_PROVIDER_SITE_OTHER): Payer: Medicare Other | Admitting: Family

## 2021-03-01 ENCOUNTER — Encounter: Payer: Self-pay | Admitting: Family

## 2021-03-01 VITALS — BP 144/79 | HR 73 | Temp 97.7°F | Ht 66.0 in | Wt 176.4 lb

## 2021-03-01 DIAGNOSIS — E669 Obesity, unspecified: Secondary | ICD-10-CM

## 2021-03-01 DIAGNOSIS — E039 Hypothyroidism, unspecified: Secondary | ICD-10-CM

## 2021-03-01 DIAGNOSIS — F1320 Sedative, hypnotic or anxiolytic dependence, uncomplicated: Secondary | ICD-10-CM

## 2021-03-01 DIAGNOSIS — I10 Essential (primary) hypertension: Secondary | ICD-10-CM | POA: Diagnosis not present

## 2021-03-01 DIAGNOSIS — E559 Vitamin D deficiency, unspecified: Secondary | ICD-10-CM

## 2021-03-01 DIAGNOSIS — K219 Gastro-esophageal reflux disease without esophagitis: Secondary | ICD-10-CM

## 2021-03-01 DIAGNOSIS — Z79899 Other long term (current) drug therapy: Secondary | ICD-10-CM

## 2021-03-01 DIAGNOSIS — F411 Generalized anxiety disorder: Secondary | ICD-10-CM

## 2021-03-01 MED ORDER — ALPRAZOLAM 0.5 MG PO TABS: 0.5000 mg | ORAL_TABLET | Freq: Every evening | ORAL | 3 refills | Status: DC | PRN

## 2021-03-01 MED ORDER — DICLOFENAC SODIUM 75 MG PO TBEC: 75.0000 mg | DELAYED_RELEASE_TABLET | Freq: Two times a day (BID) | ORAL | 1 refills | Status: DC | PRN

## 2021-03-01 NOTE — Progress Notes (Signed)
Subjective:    Patient ID: Abigail Moss, female    DOB: 1945-07-26, 75 y.o.   MRN: [id number]  Chief Complaint  Patient presents with   Medical Management of Chronic Issues    HAIR COMING OUT WANTS FOLATE TESTED    Pt presents to the office today for chronic follow up. She is drinking Ensure BID and this had helped with her appetite.  Hypertension This is a chronic problem. The current episode started more than 1 year ago. The problem has been resolved since onset. The problem is uncontrolled. Associated symptoms include anxiety and malaise/fatigue. Pertinent negatives include no peripheral edema or shortness of breath. Risk factors for coronary artery disease include dyslipidemia. The current treatment provides moderate improvement. Identifiable causes of hypertension include a thyroid problem.  Gastroesophageal Reflux She complains of belching and heartburn. This is a chronic problem. The current episode started more than 1 year ago. The problem occurs occasionally. Associated symptoms include fatigue. She has tried a PPI for the symptoms. The treatment provided moderate relief.  Thyroid Problem Presents for follow-up visit. Symptoms include anxiety, constipation, depressed mood, fatigue and hair loss. Patient reports no diarrhea or dry skin. The symptoms have been stable.  Anxiety Presents for follow-up visit. Symptoms include depressed mood, excessive worry, irritability and nervous/anxious behavior. Patient reports no shortness of breath. Symptoms occur most days. The severity of symptoms is moderate.       Review of Systems  Constitutional:  Positive for fatigue, irritability and malaise/fatigue.  Respiratory:  Negative for shortness of breath.   Gastrointestinal:  Positive for constipation and heartburn. Negative for diarrhea.  Psychiatric/Behavioral:  The patient is nervous/anxious.   All other systems reviewed and are negative.     Objective:   Physical Exam Vitals  reviewed.  Constitutional:      General: She is not in acute distress.    Appearance: She is well-developed.  HENT:     Head: Normocephalic and atraumatic.  Eyes:     Pupils: Pupils are equal, round, and reactive to light.  Neck:     Thyroid: No thyromegaly.  Cardiovascular:     Rate and Rhythm: Normal rate and regular rhythm.     Heart sounds: Normal heart sounds. No murmur heard. Pulmonary:     Effort: Pulmonary effort is normal. No respiratory distress.     Breath sounds: Normal breath sounds. No wheezing.  Abdominal:     General: Bowel sounds are normal. There is no distension.     Palpations: Abdomen is soft.     Tenderness: There is no abdominal tenderness.  Musculoskeletal:        General: No tenderness. Normal range of motion.     Cervical back: Normal range of motion and neck supple.  Skin:    General: Skin is warm and dry.  Neurological:     Mental Status: She is alert and oriented to person, place, and time.     Cranial Nerves: No cranial nerve deficit.     Deep Tendon Reflexes: Reflexes are normal and symmetric.  Psychiatric:        Behavior: Behavior normal.        Thought Content: Thought content normal.        Judgment: Judgment normal.      BP (!) 144/79   Pulse 73   Temp 97.7 F (36.5 C) (Temporal)   Ht 5\' 6"  (1.676 m)   Wt 176 lb 6.4 oz (80 kg)   BMI 28.47 kg/m  Assessment & Plan:  Abigail Moss comes in today with chief complaint of Medical Management of Chronic Issues (HAIR COMING OUT WANTS FOLATE TESTED )   Diagnosis and orders addressed:  1. Primary hypertension  2. Gastroesophageal reflux disease, unspecified whether esophagitis present  3. Hypothyroidism, unspecified type  4. Benzodiazepine dependence (HCC) - ALPRAZolam (XANAX) 0.5 MG tablet; Take 1 tablet (0.5 mg total) by mouth at bedtime as needed.  Dispense: 30 tablet; Refill: 3  5. Controlled substance agreement signed - ALPRAZolam (XANAX) 0.5 MG tablet; Take 1 tablet (0.5  mg total) by mouth at bedtime as needed.  Dispense: 30 tablet; Refill: 3  6. Vitamin D deficiency  7. GAD (generalized anxiety disorder)  - ALPRAZolam (XANAX) 0.5 MG tablet; Take 1 tablet (0.5 mg total) by mouth at bedtime as needed.  Dispense: 30 tablet; Refill: 3  8. Obesity (BMI 30-39.9)   Labs pending Health Maintenance reviewed Diet and exercise encouraged  Follow up plan: 3 months    Jannifer Rodney, FNP

## 2021-03-01 NOTE — Patient Instructions (Signed)
Protein-Energy Malnutrition Protein-energy malnutrition is when a person does not eat enough protein, fat, and calories. When this happens over time, it can lead to severe loss of muscle tissue (muscle wasting). This condition also affects the body's defense system (immune system) and can lead to other health problems. What are the causes? This condition may be caused by: Not eating enough protein, fat, or calories. Having certain chronic medical conditions. Eating too little. What increases the risk? The following factors may make you more likely to develop this condition: Living in poverty. Long-term hospitalization. Alcohol or drug dependency. Addiction often leads to a lifestyle in which proper diet is ignored. Dependency can also hurt the metabolism and the body's ability to absorb nutrients. Eating disorders, such as anorexia nervosa or bulimia. Chewing or swallowing problems. People with these disorders may not eat enough. Having certain conditions, such as: Inflammatory bowel disease. Inflammation of the intestines makes it difficult for the body to absorb nutrients. Cancer or AIDS. These diseases can cause a loss of appetite. Chronic heart failure. This interferes with how the body uses nutrients. Cystic fibrosis. This disease can make it difficult for the body to absorb nutrients. Eating a diet that extremely restricts protein, fat, or calorie intake. What are the signs or symptoms? Symptoms of this condition include: Tiredness (fatigue). Weakness. Dizziness. Fainting. Weight loss. Loss of muscle tone and muscle mass. Poor immune response. Lack of menstruation. Poor memory. Hair loss. Skin changes. How is this diagnosed? This condition may be diagnosed based on: Your medical and dietary history. A physical exam. This may include a measurement of your body mass index. Blood tests. How is this treated? This condition may be managed with: Nutrition therapy. This may  include working with a dietitian. Treatment for underlying conditions. People with severe protein-energy malnutrition may need to be treated in a hospital. This may involve receiving nutrition and fluids through an IV. Follow these instructions at home:  Eat a balanced diet. In each meal, include at least one food that is high in protein. Foods that are high in protein include: Meat. Poultry. Fish. Eggs. Cheese. Milk. Beans. Nuts. Eat nutrient-rich foods that are easy to swallow and digest, such as: Fruit and yogurt smoothies. Oatmeal with nut butter. Nutrition supplement drinks. Try to eat six small meals each day instead of three large meals. Take vitamin and protein supplements as told by your health care provider or dietitian. Follow your health care provider's recommendations about exercise and activity. Keep all follow-up visits. This is important. Contact a health care provider if: You have increased weakness or fatigue. You faint. You are a woman and you stop having your period (menstruating). You have rapid hair loss. You have unexpected weight loss. You have diarrhea. You have nausea and vomiting. Get help right away if: You have difficulty breathing. You have chest pain. These symptoms may represent a serious problem that is an emergency. Do not wait to see if the symptoms will go away. Get medical help right away. Call your local emergency services (911 in the [place] Do not drive yourself to the hospital. Summary Protein-energy malnutrition is when a person does not eat enough protein, fat, and calories. Protein-energy malnutrition can lead to severe loss of muscle tissue (muscle wasting). This condition also affects the body's defense system (immune system) and can lead to other health problems. Talk with your health care provider about treatment for this condition. Effective treatment depends on the underlying cause of the malnutrition. This information is not    intended to replace advice given to you by your health care provider. Make sure you discuss any questions you have with your health care provider. Document Revised: 06/23/2020 Document Reviewed: 06/23/2020 Elsevier Patient Education  2022 [employer]  

## 2021-03-12 ENCOUNTER — Other Ambulatory Visit: Payer: Self-pay | Admitting: Family Medicine

## 2021-03-12 DIAGNOSIS — Z1231 Encounter for screening mammogram for malignant neoplasm of breast: Secondary | ICD-10-CM

## 2021-03-25 ENCOUNTER — Ambulatory Visit (INDEPENDENT_AMBULATORY_CARE_PROVIDER_SITE_OTHER): Payer: Medicare Other | Admitting: Family

## 2021-03-25 ENCOUNTER — Encounter: Payer: Self-pay | Admitting: Family

## 2021-03-25 ENCOUNTER — Other Ambulatory Visit: Payer: Self-pay

## 2021-03-25 VITALS — BP 122/79 | HR 75 | Temp 97.8°F | Ht 66.0 in | Wt 175.2 lb

## 2021-03-25 DIAGNOSIS — J0111 Acute recurrent frontal sinusitis: Secondary | ICD-10-CM | POA: Diagnosis not present

## 2021-03-25 DIAGNOSIS — R350 Frequency of micturition: Secondary | ICD-10-CM | POA: Diagnosis not present

## 2021-03-25 DIAGNOSIS — R630 Anorexia: Secondary | ICD-10-CM | POA: Diagnosis not present

## 2021-03-25 LAB — URINALYSIS, COMPLETE
Bilirubin, UA: NEGATIVE
Glucose, UA: NEGATIVE
Leukocytes,UA: NEGATIVE
Nitrite, UA: NEGATIVE
Protein,UA: NEGATIVE
RBC, UA: NEGATIVE
Specific Gravity, UA: 1.025 (ref 1.005–1.030)
Urobilinogen, Ur: 0.2 mg/dL (ref 0.2–1.0)
pH, UA: 5.5 (ref 5.0–7.5)

## 2021-03-25 LAB — MICROSCOPIC EXAMINATION
Epithelial Cells (non renal): NONE SEEN /hpf (ref 0–10)
Renal Epithel, UA: NONE SEEN /hpf
WBC, UA: NONE SEEN /hpf (ref 0–5)

## 2021-03-25 MED ORDER — AMOXICILLIN-POT CLAVULANATE 875-125 MG PO TABS: 1.0000 | ORAL_TABLET | Freq: Two times a day (BID) | ORAL | 0 refills | Status: DC

## 2021-03-25 NOTE — Progress Notes (Signed)
Subjective:    Patient ID: Abigail Moss, female    DOB: [birth year], 75 y.o.   MRN: [id number]  Chief Complaint  Patient presents with   Extremity Weakness    Wants to see if she still needs to drink ensure.    PT presents to the office today to follow up on weakness and decrease appetite. She reports since COVID she can not stand the idea of eating any meat. She was seen on 03/01/21 and told to start drinking Ensure TID with meals. She reports she has been doing them twice a day because of cost, but has been able to tell a big difference in her energy and feeling better. She also reports increase appetite over the last couple of days. Her weight is stable.  Urinary Frequency  This is a new problem. The current episode started in the past 7 days. The problem occurs intermittently. The problem has been waxing and waning. The patient is experiencing no pain. Associated symptoms include frequency and urgency. Pertinent negatives include no discharge, flank pain, hematuria, hesitancy, nausea or vomiting. She has tried increased fluids for the symptoms. The treatment provided mild relief.  Sinusitis This is a recurrent problem. The current episode started in the past 7 days. The problem has been waxing and waning since onset. There has been no fever. Her pain is at a severity of 1/10. The pain is mild. Associated symptoms include congestion, headaches, sinus pressure, sneezing and a sore throat. Pertinent negatives include no ear pain, shortness of breath or swollen glands. The treatment provided mild relief.     Review of Systems  HENT:  Positive for congestion, sinus pressure, sneezing and sore throat. Negative for ear pain.   Respiratory:  Negative for shortness of breath.   Gastrointestinal:  Negative for nausea and vomiting.  Genitourinary:  Positive for frequency and urgency. Negative for flank pain, hematuria and hesitancy.  Neurological:  Positive for headaches.  All other systems reviewed  and are negative.     Objective:   Physical Exam Vitals reviewed.  Constitutional:      General: She is not in acute distress.    Appearance: She is well-developed.  HENT:     Head: Normocephalic and atraumatic.     Nose:     Right Sinus: Maxillary sinus tenderness present.     Left Sinus: Maxillary sinus tenderness present.  Eyes:     Pupils: Pupils are equal, round, and reactive to light.  Neck:     Thyroid: No thyromegaly.  Cardiovascular:     Rate and Rhythm: Normal rate and regular rhythm.     Heart sounds: Normal heart sounds. No murmur heard. Pulmonary:     Effort: Pulmonary effort is normal. No respiratory distress.     Breath sounds: Normal breath sounds. No wheezing.  Abdominal:     General: Bowel sounds are normal. There is no distension.     Palpations: Abdomen is soft.     Tenderness: There is no abdominal tenderness.  Musculoskeletal:        General: No tenderness. Normal range of motion.     Cervical back: Normal range of motion and neck supple.  Skin:    General: Skin is warm and dry.  Neurological:     Mental Status: She is alert and oriented to person, place, and time.     Cranial Nerves: No cranial nerve deficit.     Deep Tendon Reflexes: Reflexes are normal and symmetric.  Psychiatric:  Behavior: Behavior normal.        Thought Content: Thought content normal.        Judgment: Judgment normal.      BP 122/79   Pulse 75   Temp 97.8 F (36.6 C) (Temporal)   Ht 5\' 6"  (1.676 m)   Wt 175 lb 3.2 oz (79.5 kg)   BMI 28.28 kg/m      Assessment & Plan:  Abigail Moss comes in today with chief complaint of Extremity Weakness (Wants to see if she still needs to drink ensure. )   Diagnosis and orders addressed:  1. Decreased appetite Continue Ensure but can decrease to daily Encourage healthy diet  2. Urinary frequency Urine pending - amoxicillin-clavulanate (AUGMENTIN) 875-125 MG tablet; Take 1 tablet by mouth 2 (two) times daily.   Dispense: 14 tablet; Refill: 0  3. Acute recurrent frontal sinusitis - Take meds as prescribed - Use a cool mist humidifier  -Use saline nose sprays frequently -Force fluids -For any cough or congestion  Use plain Mucinex- regular strength or max strength is fine -For fever or aces or pains- take tylenol or ibuprofen. -Throat lozenges if help - amoxicillin-clavulanate (AUGMENTIN) 875-125 MG tablet; Take 1 tablet by mouth 2 (two) times daily.  Dispense: 14 tablet; Refill: 0   [name], FNP

## 2021-03-25 NOTE — Patient Instructions (Signed)

## 2021-04-29 DIAGNOSIS — G894 Chronic pain syndrome: Secondary | ICD-10-CM | POA: Diagnosis not present

## 2021-04-29 DIAGNOSIS — M62830 Muscle spasm of back: Secondary | ICD-10-CM | POA: Diagnosis not present

## 2021-06-07 ENCOUNTER — Ambulatory Visit
Admission: RE | Admit: 2021-06-07 | Discharge: 2021-06-07 | Disposition: A | Discharge status: Home / Self Care | Payer: Medicare Other | Source: Ambulatory Visit | Attending: Family Medicine | Admitting: Family Medicine

## 2021-06-07 ENCOUNTER — Other Ambulatory Visit: Payer: Self-pay

## 2021-06-07 DIAGNOSIS — Z1231 Encounter for screening mammogram for malignant neoplasm of breast: Secondary | ICD-10-CM

## 2021-06-18 ENCOUNTER — Ambulatory Visit (INDEPENDENT_AMBULATORY_CARE_PROVIDER_SITE_OTHER): Payer: Medicare Other | Admitting: Family Medicine

## 2021-06-18 DIAGNOSIS — J0111 Acute recurrent frontal sinusitis: Secondary | ICD-10-CM | POA: Diagnosis not present

## 2021-06-18 MED ORDER — AMOXICILLIN-POT CLAVULANATE 875-125 MG PO TABS: 1.0000 | ORAL_TABLET | Freq: Two times a day (BID) | ORAL | 0 refills | Status: DC

## 2021-06-18 NOTE — Progress Notes (Signed)
Telephone visit  Subjective: SJ:[id number] infection PCP: Junie Spencer, FNP OQH:UTML Abigail Moss is a 75 y.o. female calls for telephone consult today. Patient provides verbal consent for consult held via phone.  Due to COVID-19 pandemic this visit was conducted virtually. This visit type was conducted due to national recommendations for restrictions regarding the COVID-19 Pandemic (e.g. social distancing, sheltering in place) in an effort to limit this patient's exposure and mitigate transmission in our community. All issues noted in this document were discussed and addressed.  A physical exam was not performed with this format.   Location of patient: home Location of provider: [facility] Others present for call: none  1. Sinus infection Patient reports thinks she has a sinus infection.  She reports sinus pressure, sore throat, cough.  She reports pus from nose.  Onset of symptoms was Sunday.  She is using Mucinex, flonase, Singulair.  She reports drainage.  No fevers, nausea, vomiting.  Her spouse has a cold.  Had her flu shot this year.  Has not tested for COVID.  Does not smoke.   ROS: Per HPI  Allergies  Allergen Reactions   Baclofen    Elemental Sulfur     REACTION: itching   Gabapentin    Meloxicam    Neomycin     REACTION: itching   Latex Rash   Past Medical History:  Diagnosis Date   GERD (gastroesophageal reflux disease)    Hypertension    Osteopenia    Thyroid disease     Current Outpatient Medications:    albuterol (VENTOLIN HFA) 108 (90 Base) MCG/ACT inhaler, TAKE 2 PUFFS BY MOUTH EVERY 6 HOURS AS NEEDED FOR WHEEZE OR SHORTNESS OF BREATH, Disp: 18 g, Rfl: 0   alendronate (FOSAMAX) 70 MG tablet, TAKE 1 TABLET BY MOUTH  WEEKLY WITH 8 OUNCES OF  PLAIN WATER 1/2 HOUR BEFORE FIRST FOOD DRINK OR MEDS.  STAY UPRIGHT FOR 1/2 HOUR., Disp: 12 tablet, Rfl: 3   ALPRAZolam (XANAX) 0.5 MG tablet, Take 1 tablet (0.5 mg total) by mouth at bedtime as needed., Disp: 30 tablet, Rfl: 3    amoxicillin-clavulanate (AUGMENTIN) 875-125 MG tablet, Take 1 tablet by mouth 2 (two) times daily., Disp: 14 tablet, Rfl: 0   aspirin EC 81 MG tablet, Take 81 mg by mouth daily., Disp: , Rfl:    buPROPion (WELLBUTRIN XL) 150 MG 24 hr tablet, TAKE 1 TABLET BY MOUTH  DAILY, Disp: 90 tablet, Rfl: 1   Calcium Citrate-Vitamin D (CALCIUM CITRATE + PO), Take 600 mg by mouth daily., Disp: , Rfl:    Cholecalciferol (VITAMIN D-3 PO), Take 800 mg by mouth daily., Disp: , Rfl:    diclofenac (VOLTAREN) 75 MG EC tablet, Take 1 tablet (75 mg total) by mouth 2 (two) times daily as needed., Disp: 180 tablet, Rfl: 1   fluticasone (FLONASE) 50 MCG/ACT nasal spray, SPRAY 2 SPRAYS INTO EACH NOSTRIL EVERY DAY, Disp: 48 mL, Rfl: 1   HYDROcodone-acetaminophen (NORCO) 10-325 MG tablet, Take 1 tablet by mouth 3 (three) times daily as needed., Disp: , Rfl:    levothyroxine (SYNTHROID) 125 MCG tablet, TAKE 1 TABLET BY MOUTH  DAILY BEFORE BREAKFAST, Disp: 90 tablet, Rfl: 3   losartan (COZAAR) 100 MG tablet, TAKE 1 TABLET BY MOUTH  DAILY, Disp: 90 tablet, Rfl: 1   montelukast (SINGULAIR) 10 MG tablet, TAKE 1 TABLET BY MOUTH  DAILY AT BEDTIME, Disp: 90 tablet, Rfl: 1   MULTIPLE VITAMIN PO, Take by mouth., Disp: , Rfl:  omeprazole (PRILOSEC) 40 MG capsule, TAKE 1 CAPSULE BY MOUTH  DAILY, Disp: 90 capsule, Rfl: 1  Assessment/ Plan: 75 y.o. female   Acute recurrent frontal sinusitis - Plan: amoxicillin-clavulanate (AUGMENTIN) 875-125 MG tablet  Reporting signs and symptoms consistent with bacterial sinusitis.  I did recommend given the number of recurrent sinus infection she has had this year that she should see an ear nose and throat doctor.  She will discuss this further with her PCP at her next office visit.  She understands red flag signs and symptoms and reasons for reevaluation.  Offered COVID testing but she declined.  Start time: 12:27pm End time: 12:33pm  Total time spent on patient care (including telephone call/  virtual visit): 6 minutes  Zahki Hoogendoorn Hulen Skains, DO [facility] [phone]

## 2021-06-20 ENCOUNTER — Encounter: Payer: Self-pay | Admitting: Family Medicine

## 2021-06-20 ENCOUNTER — Telehealth: Payer: Self-pay | Admitting: Family

## 2021-06-20 ENCOUNTER — Ambulatory Visit (INDEPENDENT_AMBULATORY_CARE_PROVIDER_SITE_OTHER): Payer: Medicare Other | Admitting: Family Medicine

## 2021-06-20 DIAGNOSIS — J40 Bronchitis, not specified as acute or chronic: Secondary | ICD-10-CM | POA: Diagnosis not present

## 2021-06-20 DIAGNOSIS — J301 Allergic rhinitis due to pollen: Secondary | ICD-10-CM | POA: Diagnosis not present

## 2021-06-20 DIAGNOSIS — J069 Acute upper respiratory infection, unspecified: Secondary | ICD-10-CM | POA: Diagnosis not present

## 2021-06-20 MED ORDER — AZITHROMYCIN 250 MG PO TABS: ORAL_TABLET | ORAL | 0 refills | Status: DC

## 2021-06-20 MED ORDER — FLUTICASONE PROPIONATE 50 MCG/ACT NA SUSP: NASAL | 1 refills | Status: DC

## 2021-06-20 MED ORDER — PREDNISONE 20 MG PO TABS: ORAL_TABLET | ORAL | 0 refills | Status: DC

## 2021-06-20 NOTE — Progress Notes (Signed)
Virtual Visit via telephone Note Due to COVID-19 pandemic this visit was conducted virtually. This visit type was conducted due to national recommendations for restrictions regarding the COVID-19 Pandemic (e.g. social distancing, sheltering in place) in an effort to limit this patient's exposure and mitigate transmission in our community. All issues noted in this document were discussed and addressed.  A physical exam was not performed with this format.   I connected with Abigail Moss on 06/20/2021 at 1336 by telephone and verified that I am speaking with the correct person using two identifiers. Abigail Moss is currently located at home and family is currently with them during visit. The provider, Kari Baars, FNP is located in their office at time of visit.  I discussed the limitations, risks, security and privacy concerns of performing an evaluation and management service by telephone and the availability of in person appointments. I also discussed with the patient that there may be a patient responsible charge related to this service. The patient expressed understanding and agreed to proceed.  Subjective:  Patient ID: Abigail Moss, female    DOB: 11-03-1945, 75 y.o.   MRN: [id number] home Chief Complaint:  Cough, URI, and Wheezing   HPI: Abigail Moss is a 75 y.o. female presenting on 06/20/2021 for Cough, URI, and Wheezing   Patient reports ongoing and worsening cough, chest congestion, malaise, and now wheezing.  She was recently started on Augmentin for acute sinusitis.  States symptoms have not improved.  Is concerned she may be developing pneumonia.  No reported confusion, weakness, or fever.  Cough This is a recurrent problem. The current episode started 1 to 4 weeks ago. The problem has been gradually worsening. The problem occurs constantly. The cough is Productive of brown sputum. Associated symptoms include chest pain, chills, nasal congestion, postnasal drip and wheezing.  Pertinent negatives include no ear congestion, ear pain, fever, headaches, heartburn, hemoptysis, myalgias, rash, rhinorrhea, sore throat, shortness of breath, sweats or weight loss. She has tried a beta-agonist inhaler and rest for the symptoms. The treatment provided mild relief.  URI  This is a recurrent problem. The current episode started 1 to 4 weeks ago. The problem has been gradually worsening. Associated symptoms include chest pain, congestion, coughing and wheezing. Pertinent negatives include no abdominal pain, diarrhea, dysuria, ear pain, headaches, joint pain, joint swelling, nausea, neck pain, plugged ear sensation, rash, rhinorrhea, sinus pain, sneezing, sore throat, swollen glands or vomiting. She has tried decongestant and inhaler use for the symptoms. The treatment provided mild relief.  Wheezing  This is a new problem. The current episode started in the past 7 days. The problem has been unchanged. Associated symptoms include chest pain, chills, coughing and sputum production. Pertinent negatives include no abdominal pain, coryza, diarrhea, ear pain, fever, headaches, hemoptysis, neck pain, rash, rhinorrhea, shortness of breath, sore throat, swollen glands or vomiting. She has tried beta agonist inhalers for the symptoms. The treatment provided mild relief.    Relevant past medical, surgical, family, and social history reviewed and updated as indicated.  Allergies and medications reviewed and updated.   Past Medical History:  Diagnosis Date   GERD (gastroesophageal reflux disease)    Hypertension    Osteopenia    Thyroid disease     Past Surgical History:  Procedure Laterality Date   EAR TUBE REMOVAL     ROTATOR CUFF REPAIR Left    TONSILLECTOMY     VEIN LIGATION AND STRIPPING      Social  History   Socioeconomic History   Marital status: Married    Spouse name: [name]   Number of children: 2   Years of education: 12   Highest education level: High school graduate   Occupational History   Occupation: retired     Comment: worked at [employer]  Tobacco Use   Smoking status: Former    Types: Cigarettes    Quit date: 07/07/1996    Years since quitting: 24.9   Smokeless tobacco: Never  Vaping Use   Vaping Use: Never used  Substance and Sexual Activity   Alcohol use: No    Alcohol/week: 0.0 standard drinks   Drug use: No   Sexual activity: Not Currently  Other Topics Concern   Not on file  Social History Narrative   Lives at home with husband.  Grandson stays with them a lot.   Children live nearby   Social Determinants of Health   Financial Resource Strain: Low Risk    Difficulty of Paying Living Expenses: Not hard at all  Food Insecurity: No Food Insecurity   Worried About Programme researcher, broadcasting/film/video in the Last Year: Never true   Barista in the Last Year: Never true  Transportation Needs: No Transportation Needs   Lack of Transportation (Medical): No   Lack of Transportation (Non-Medical): No  Physical Activity: Insufficiently Active   Days of Exercise per Week: 7 days   Minutes of Exercise per Session: 20 min  Stress: No Stress Concern Present   Feeling of Stress : Only a little  Social Connections: Moderately Integrated   Frequency of Communication with Friends and Family: More than three times a week   Frequency of Social Gatherings with Friends and Family: More than three times a week   Attends Religious Services: 1 to 4 times per year   Active Member of [employer] or Organizations: No   Attends Banker Meetings: Never   Marital Status: Married  Catering manager Violence: Not At Risk   Fear of Current or Ex-Partner: No   Emotionally Abused: No   Physically Abused: No   Sexually Abused: No    Outpatient Encounter Medications as of 06/20/2021  Medication Sig   azithromycin (ZITHROMAX Z-PAK) 250 MG tablet As directed   predniSONE (DELTASONE) 20 MG tablet 2 po at sametime daily for 5 days- start tomorrow   albuterol  (VENTOLIN HFA) 108 (90 Base) MCG/ACT inhaler TAKE 2 PUFFS BY MOUTH EVERY 6 HOURS AS NEEDED FOR WHEEZE OR SHORTNESS OF BREATH   alendronate (FOSAMAX) 70 MG tablet TAKE 1 TABLET BY MOUTH  WEEKLY WITH 8 OUNCES OF  PLAIN WATER 1/2 HOUR BEFORE FIRST FOOD DRINK OR MEDS.  STAY UPRIGHT FOR 1/2 HOUR.   ALPRAZolam (XANAX) 0.5 MG tablet Take 1 tablet (0.5 mg total) by mouth at bedtime as needed.   amoxicillin-clavulanate (AUGMENTIN) 875-125 MG tablet Take 1 tablet by mouth 2 (two) times daily for 10 days.   aspirin EC 81 MG tablet Take 81 mg by mouth daily.   buPROPion (WELLBUTRIN XL) 150 MG 24 hr tablet TAKE 1 TABLET BY MOUTH  DAILY   Calcium Citrate-Vitamin D (CALCIUM CITRATE + PO) Take 600 mg by mouth daily.   Cholecalciferol (VITAMIN D-3 PO) Take 800 mg by mouth daily.   diclofenac (VOLTAREN) 75 MG EC tablet Take 1 tablet (75 mg total) by mouth 2 (two) times daily as needed.   fluticasone (FLONASE) 50 MCG/ACT nasal spray SPRAY 2 SPRAYS INTO EACH NOSTRIL EVERY DAY  HYDROcodone-acetaminophen (NORCO) 10-325 MG tablet Take 1 tablet by mouth 3 (three) times daily as needed.   levothyroxine (SYNTHROID) 125 MCG tablet TAKE 1 TABLET BY MOUTH  DAILY BEFORE BREAKFAST   losartan (COZAAR) 100 MG tablet TAKE 1 TABLET BY MOUTH  DAILY   montelukast (SINGULAIR) 10 MG tablet TAKE 1 TABLET BY MOUTH  DAILY AT BEDTIME   MULTIPLE VITAMIN PO Take by mouth.   omeprazole (PRILOSEC) 40 MG capsule TAKE 1 CAPSULE BY MOUTH  DAILY   [DISCONTINUED] fluticasone (FLONASE) 50 MCG/ACT nasal spray SPRAY 2 SPRAYS INTO EACH NOSTRIL EVERY DAY   No facility-administered encounter medications on file as of 06/20/2021.    Allergies  Allergen Reactions   Baclofen    Elemental Sulfur     REACTION: itching   Gabapentin    Meloxicam    Neomycin     REACTION: itching   Latex Rash    Review of Systems  Constitutional:  Positive for activity change, appetite change, chills and fatigue. Negative for diaphoresis, fever, unexpected weight  change and weight loss.  HENT:  Positive for congestion and postnasal drip. Negative for ear pain, rhinorrhea, sinus pressure, sinus pain, sneezing and sore throat.   Respiratory:  Positive for cough, sputum production, chest tightness and wheezing. Negative for apnea, hemoptysis, choking, shortness of breath and stridor.   Cardiovascular:  Positive for chest pain. Negative for palpitations and leg swelling.  Gastrointestinal:  Negative for abdominal pain, diarrhea, heartburn, nausea and vomiting.  Genitourinary:  Negative for decreased urine volume and dysuria.  Musculoskeletal:  Negative for joint pain, myalgias and neck pain.  Skin:  Negative for rash.  Neurological:  Negative for weakness and headaches.  Psychiatric/Behavioral:  Negative for confusion.   All other systems reviewed and are negative.       Observations/Objective: No vital signs or physical exam, this was a telephone or virtual health encounter.  Pt alert and oriented, answers all questions appropriately, and able to speak in full sentences.    Assessment and Plan: Hitomi was seen today for cough, uri and wheezing.  Diagnoses and all orders for this visit:  Upper respiratory infection with cough and congestion Bronchitis with acute wheezing Ongoing URI symptoms with new development of chest congestion, tightness, and wheezing.  Reports brown-tinged sputum.  Recently started on Augmentin for sinusitis.  Concerning for development of pneumonia.  Continue albuterol inhaler at home.  We will add prednisone and azithromycin to regimen.  Patient aware of red flags which require emergent evaluation.  Follow-up in 2 weeks or sooner if needed. -     predniSONE (DELTASONE) 20 MG tablet; 2 po at sametime daily for 5 days- start tomorrow -     azithromycin (ZITHROMAX Z-PAK) 250 MG tablet; As directed  Seasonal allergic rhinitis Reports out of Flonase and would like a refill today. -     fluticasone (FLONASE) 50 MCG/ACT nasal  spray; SPRAY 2 SPRAYS INTO EACH NOSTRIL EVERY DAY     Follow Up Instructions: Return in about 2 weeks (around 07/04/2021), or if symptoms worsen or fail to improve.    I discussed the assessment and treatment plan with the patient. The patient was provided an opportunity to ask questions and all were answered. The patient agreed with the plan and demonstrated an understanding of the instructions.   The patient was advised to call back or seek an in-person evaluation if the symptoms worsen or if the condition fails to improve as anticipated.  The above assessment and management  plan was discussed with the patient. The patient verbalized understanding of and has agreed to the management plan. Patient is aware to call the clinic if they develop any new symptoms or if symptoms persist or worsen. Patient is aware when to return to the clinic for a follow-up visit. Patient educated on when it is appropriate to go to the emergency department.    I provided 15 minutes of time during this telephone encounter.   Kari Baars, FNP-C [facility] [address] [place], [place] [address] [phone] 06/20/2021

## 2021-06-26 ENCOUNTER — Telehealth: Payer: Self-pay | Admitting: Family

## 2021-06-26 NOTE — Telephone Encounter (Signed)
Pt called stating that she had a visit with Kari Baars on 12/15 and was prescribed Prednisone. Pt says she is feeling some better since starting the medicine but is not 100% and needs refill called in to pharmacy.

## 2021-06-27 ENCOUNTER — Other Ambulatory Visit: Payer: Self-pay | Admitting: Family

## 2021-06-27 ENCOUNTER — Ambulatory Visit (INDEPENDENT_AMBULATORY_CARE_PROVIDER_SITE_OTHER): Payer: Medicare Other | Admitting: Family Medicine

## 2021-06-27 ENCOUNTER — Encounter: Payer: Self-pay | Admitting: Family Medicine

## 2021-06-27 DIAGNOSIS — J069 Acute upper respiratory infection, unspecified: Secondary | ICD-10-CM | POA: Diagnosis not present

## 2021-06-27 DIAGNOSIS — M810 Age-related osteoporosis without current pathological fracture: Secondary | ICD-10-CM

## 2021-06-27 MED ORDER — AMOXICILLIN-POT CLAVULANATE 875-125 MG PO TABS: 1.0000 | ORAL_TABLET | Freq: Two times a day (BID) | ORAL | 0 refills | Status: DC

## 2021-06-27 MED ORDER — GUAIFENESIN ER 600 MG PO TB12: 600.0000 mg | ORAL_TABLET | Freq: Two times a day (BID) | ORAL | 0 refills | Status: AC

## 2021-06-27 NOTE — Progress Notes (Signed)
Virtual Visit via telephone note Due to COVID-19 pandemic this visit was conducted virtually. This visit type was conducted due to national recommendations for restrictions regarding the COVID-19 Pandemic (e.g. social distancing, sheltering in place) in an effort to limit this patient's exposure and mitigate transmission in our community. All issues noted in this document were discussed and addressed.  A physical exam was not performed with this format.   I connected with Abigail Moss on 06/27/2021 at 0940 by telephone and verified that I am speaking with the correct person using two identifiers. Abigail Moss is currently located at home and patient is currently with them during visit. The provider, Kari Baars, FNP is located in their office at time of visit.  I discussed the limitations, risks, security and privacy concerns of performing an evaluation and management service by virtual visit and the availability of in person appointments. I also discussed with the patient that there may be a patient responsible charge related to this service. The patient expressed understanding and agreed to proceed.  Subjective:  Patient ID: Abigail Moss, female    DOB: Nov 30, 1945, 75 y.o.   MRN: [id number]  Chief Complaint:  URI   HPI: CHERICA Moss is a 75 y.o. female presenting on 06/27/2021 for URI   Patient reports ongoing upper respiratory congestion and cough.  She had a telephone visit on 06/20/2021 and was prescribed prednisone, azithromycin, Augmentin, and Flonase.  She was instructed on symptomatic care and to take Mucinex twice daily with plenty of fluids during this visit.  She reports today that she continues to have cough and congestion.  She only took the azithromycin, did not take Augmentin as prescribed.  She also states she is only been taking Mucinex once daily.  No fever, chills, weakness, confusion, or decreased urination.    Relevant past medical, surgical, family, and social history  reviewed and updated as indicated.  Allergies and medications reviewed and updated.   Past Medical History:  Diagnosis Date   GERD (gastroesophageal reflux disease)    Hypertension    Osteopenia    Thyroid disease     Past Surgical History:  Procedure Laterality Date   EAR TUBE REMOVAL     ROTATOR CUFF REPAIR Left    TONSILLECTOMY     VEIN LIGATION AND STRIPPING      Social History   Socioeconomic History   Marital status: Married    Spouse name: Abigail Moss   Number of children: 2   Years of education: 12   Highest education level: High school graduate  Occupational History   Occupation: retired     Comment: worked at [employer]  Tobacco Use   Smoking status: Former    Types: Cigarettes    Quit date: 07/07/1996    Years since quitting: 24.9   Smokeless tobacco: Never  Vaping Use   Vaping Use: Never used  Substance and Sexual Activity   Alcohol use: No    Alcohol/week: 0.0 standard drinks   Drug use: No   Sexual activity: Not Currently  Other Topics Concern   Not on file  Social History Narrative   Lives at home with husband.  Grandson stays with them a lot.   Children live nearby   Social Determinants of Health   Financial Resource Strain: Low Risk    Difficulty of Paying Living Expenses: Not hard at all  Food Insecurity: No Food Insecurity   Worried About Programme researcher, broadcasting/film/video in the Last Year: Never  true   Ran Out of Food in the Last Year: Never true  Transportation Needs: No Transportation Needs   Lack of Transportation (Medical): No   Lack of Transportation (Non-Medical): No  Physical Activity: Insufficiently Active   Days of Exercise per Week: 7 days   Minutes of Exercise per Session: 20 min  Stress: No Stress Concern Present   Feeling of Stress : Only a little  Social Connections: Moderately Integrated   Frequency of Communication with Friends and Family: More than three times a week   Frequency of Social Gatherings with Friends and Family: More than three  times a week   Attends Religious Services: 1 to 4 times per year   Active Member of [employer] or Organizations: No   Attends Banker Meetings: Never   Marital Status: Married  Catering manager Violence: Not At Risk   Fear of Current or Ex-Partner: No   Emotionally Abused: No   Physically Abused: No   Sexually Abused: No    Outpatient Encounter Medications as of 06/27/2021  Medication Sig   guaiFENesin (MUCINEX) 600 MG 12 hr tablet Take 1 tablet (600 mg total) by mouth 2 (two) times daily for 10 days.   albuterol (VENTOLIN HFA) 108 (90 Base) MCG/ACT inhaler TAKE 2 PUFFS BY MOUTH EVERY 6 HOURS AS NEEDED FOR WHEEZE OR SHORTNESS OF BREATH   ALPRAZolam (XANAX) 0.5 MG tablet Take 1 tablet (0.5 mg total) by mouth at bedtime as needed.   aspirin EC 81 MG tablet Take 81 mg by mouth daily.   buPROPion (WELLBUTRIN XL) 150 MG 24 hr tablet TAKE 1 TABLET BY MOUTH  DAILY   Calcium Citrate-Vitamin D (CALCIUM CITRATE + PO) Take 600 mg by mouth daily.   Cholecalciferol (VITAMIN D-3 PO) Take 800 mg by mouth daily.   diclofenac (VOLTAREN) 75 MG EC tablet Take 1 tablet (75 mg total) by mouth 2 (two) times daily as needed.   fluticasone (FLONASE) 50 MCG/ACT nasal spray SPRAY 2 SPRAYS INTO EACH NOSTRIL EVERY DAY   HYDROcodone-acetaminophen (NORCO) 10-325 MG tablet Take 1 tablet by mouth 3 (three) times daily as needed.   levothyroxine (SYNTHROID) 125 MCG tablet TAKE 1 TABLET BY MOUTH  DAILY BEFORE BREAKFAST   losartan (COZAAR) 100 MG tablet TAKE 1 TABLET BY MOUTH  DAILY   montelukast (SINGULAIR) 10 MG tablet TAKE 1 TABLET BY MOUTH  DAILY AT BEDTIME   MULTIPLE VITAMIN PO Take by mouth.   omeprazole (PRILOSEC) 40 MG capsule TAKE 1 CAPSULE BY MOUTH  DAILY   [DISCONTINUED] alendronate (FOSAMAX) 70 MG tablet TAKE 1 TABLET BY MOUTH  WEEKLY WITH 8 OUNCES OF  PLAIN WATER 1/2 HOUR BEFORE FIRST FOOD DRINK OR MEDS.  STAY UPRIGHT FOR 1/2 HOUR.   [DISCONTINUED] amoxicillin-clavulanate (AUGMENTIN) 875-125 MG  tablet Take 1 tablet by mouth 2 (two) times daily for 10 days.   [DISCONTINUED] azithromycin (ZITHROMAX Z-PAK) 250 MG tablet As directed   [DISCONTINUED] predniSONE (DELTASONE) 20 MG tablet 2 po at sametime daily for 5 days- start tomorrow   No facility-administered encounter medications on file as of 06/27/2021.    Allergies  Allergen Reactions   Baclofen    Elemental Sulfur     REACTION: itching   Gabapentin    Meloxicam    Neomycin     REACTION: itching   Latex Rash    Review of Systems  Constitutional:  Positive for activity change and fatigue. Negative for appetite change, chills, diaphoresis, fever and unexpected weight change.  HENT:  Positive for congestion. Negative for postnasal drip and rhinorrhea.   Respiratory:  Positive for cough. Negative for apnea, choking, chest tightness, shortness of breath, wheezing and stridor.   Genitourinary:  Negative for decreased urine volume.  Neurological:  Negative for weakness and headaches.  Psychiatric/Behavioral:  Negative for confusion.   All other systems reviewed and are negative.       Observations/Objective: No vital signs or physical exam, this was a virtual health encounter.  Pt alert and oriented, answers all questions appropriately, and able to speak in full sentences.    Assessment and Plan: Jazzelle was seen today for uri.  Diagnoses and all orders for this visit:  Upper respiratory infection with cough and congestion Patient was previously prescribed regimen for URI.  She did not complete regimen as prescribed.  Educated on proper use of antibiotic therapy and the need to complete antibiotics as prescribed.  Patient verbalized understanding and will take Augmentin as prescribed.  Patient aware to take Mucinex twice daily with plenty of fluids and to continue Flonase and albuterol inhaler as needed.  Report any new or worsening symptoms.  Follow-up as needed. -     guaiFENesin (MUCINEX) 600 MG 12 hr tablet; Take 1  tablet (600 mg total) by mouth 2 (two) times daily for 10 days.     Follow Up Instructions: Return if symptoms worsen or fail to improve.    I discussed the assessment and treatment plan with the patient. The patient was provided an opportunity to ask questions and all were answered. The patient agreed with the plan and demonstrated an understanding of the instructions.   The patient was advised to call back or seek an in-person evaluation if the symptoms worsen or if the condition fails to improve as anticipated.  The above assessment and management plan was discussed with the patient. The patient verbalized understanding of and has agreed to the management plan. Patient is aware to call the clinic if they develop any new symptoms or if symptoms persist or worsen. Patient is aware when to return to the clinic for a follow-up visit. Patient educated on when it is appropriate to go to the emergency department.    I provided 15 minutes of time during this telephone encounter.   Kari Baars, FNP-C [facility] [address] [place], [place] [address] [phone] 06/27/2021

## 2021-07-01 ENCOUNTER — Other Ambulatory Visit: Payer: Self-pay | Admitting: Family

## 2021-07-01 DIAGNOSIS — F411 Generalized anxiety disorder: Secondary | ICD-10-CM

## 2021-07-01 DIAGNOSIS — I10 Essential (primary) hypertension: Secondary | ICD-10-CM

## 2021-07-01 DIAGNOSIS — K219 Gastro-esophageal reflux disease without esophagitis: Secondary | ICD-10-CM

## 2021-07-02 NOTE — Telephone Encounter (Signed)
Appt made for 12-29 with Christy for 3 month.

## 2021-07-02 NOTE — Telephone Encounter (Signed)
[name]. [facility]BS for 3 mos ckup. Mail order sent

## 2021-07-04 ENCOUNTER — Other Ambulatory Visit: Payer: Self-pay

## 2021-07-04 ENCOUNTER — Encounter: Payer: Self-pay | Admitting: Family

## 2021-07-04 ENCOUNTER — Ambulatory Visit (INDEPENDENT_AMBULATORY_CARE_PROVIDER_SITE_OTHER): Payer: Medicare Other | Admitting: Family

## 2021-07-04 VITALS — BP 129/86 | HR 72 | Temp 97.0°F | Ht 66.0 in | Wt 176.0 lb

## 2021-07-04 DIAGNOSIS — Z79899 Other long term (current) drug therapy: Secondary | ICD-10-CM

## 2021-07-04 DIAGNOSIS — F1320 Sedative, hypnotic or anxiolytic dependence, uncomplicated: Secondary | ICD-10-CM

## 2021-07-04 DIAGNOSIS — J301 Allergic rhinitis due to pollen: Secondary | ICD-10-CM

## 2021-07-04 DIAGNOSIS — M810 Age-related osteoporosis without current pathological fracture: Secondary | ICD-10-CM

## 2021-07-04 DIAGNOSIS — E039 Hypothyroidism, unspecified: Secondary | ICD-10-CM

## 2021-07-04 DIAGNOSIS — I10 Essential (primary) hypertension: Secondary | ICD-10-CM

## 2021-07-04 DIAGNOSIS — E669 Obesity, unspecified: Secondary | ICD-10-CM

## 2021-07-04 DIAGNOSIS — K219 Gastro-esophageal reflux disease without esophagitis: Secondary | ICD-10-CM

## 2021-07-04 DIAGNOSIS — F411 Generalized anxiety disorder: Secondary | ICD-10-CM

## 2021-07-04 MED ORDER — OMEPRAZOLE 40 MG PO CPDR: 40.0000 mg | DELAYED_RELEASE_CAPSULE | Freq: Every day | ORAL | 0 refills | Status: DC

## 2021-07-04 MED ORDER — ALPRAZOLAM 0.5 MG PO TABS: 0.5000 mg | ORAL_TABLET | Freq: Every evening | ORAL | 3 refills | Status: DC | PRN

## 2021-07-04 MED ORDER — LOSARTAN POTASSIUM 100 MG PO TABS: 100.0000 mg | ORAL_TABLET | Freq: Every day | ORAL | 1 refills | Status: DC

## 2021-07-04 MED ORDER — ALENDRONATE SODIUM 70 MG PO TABS: ORAL_TABLET | ORAL | 0 refills | Status: DC

## 2021-07-04 MED ORDER — BUPROPION HCL ER (XL) 150 MG PO TB24: 150.0000 mg | ORAL_TABLET | Freq: Every day | ORAL | 0 refills | Status: DC

## 2021-07-04 MED ORDER — FLUTICASONE PROPIONATE 50 MCG/ACT NA SUSP: NASAL | 1 refills | Status: DC

## 2021-07-04 NOTE — Progress Notes (Signed)
Subjective:    Patient ID: Abigail Moss, female    DOB: Jul 04, 1946, 75 y.o.   MRN: [id number]  Chief Complaint  Patient presents with   Medical Management of Chronic Issues   Pt presents to the office today for chronic follow up. She is drinking Ensure as needed.  Hypertension This is a chronic problem. The current episode started more than 1 year ago. The problem has been resolved since onset. The problem is controlled. Associated symptoms include anxiety and malaise/fatigue. Pertinent negatives include no peripheral edema or shortness of breath. Risk factors for coronary artery disease include dyslipidemia and obesity. The current treatment provides moderate improvement. Identifiable causes of hypertension include a thyroid problem.  Gastroesophageal Reflux She complains of belching and heartburn. This is a chronic problem. The current episode started more than 1 year ago. The problem occurs rarely. The problem has been waxing and waning. Associated symptoms include fatigue. She has tried a PPI for the symptoms. The treatment provided moderate relief.  Thyroid Problem Presents for follow-up visit. Symptoms include anxiety and fatigue. Patient reports no diaphoresis or dry skin. The symptoms have been stable.  Arthritis Presents for follow-up visit. She complains of pain and stiffness. The symptoms have been stable. Affected locations include the left knee, right knee, left MCP and right MCP. Her pain is at a severity of 5/10. Associated symptoms include fatigue.  Anxiety Presents for follow-up visit. Symptoms include excessive worry, irritability, nervous/anxious behavior and restlessness. Patient reports no shortness of breath. The severity of symptoms is moderate.       Review of Systems  Constitutional:  Positive for fatigue, irritability and malaise/fatigue. Negative for diaphoresis.  Respiratory:  Negative for shortness of breath.   Gastrointestinal:  Positive for heartburn.   Musculoskeletal:  Positive for arthritis and stiffness.  Psychiatric/Behavioral:  The patient is nervous/anxious.   All other systems reviewed and are negative.     Objective:   Physical Exam Vitals reviewed.  Constitutional:      General: She is not in acute distress.    Appearance: She is well-developed.  HENT:     Head: Normocephalic and atraumatic.     Right Ear: Tympanic membrane normal.     Left Ear: Tympanic membrane normal.  Eyes:     Pupils: Pupils are equal, round, and reactive to light.  Neck:     Thyroid: No thyromegaly.  Cardiovascular:     Rate and Rhythm: Normal rate and regular rhythm.     Heart sounds: Normal heart sounds. No murmur heard. Pulmonary:     Effort: Pulmonary effort is normal. No respiratory distress.     Breath sounds: Normal breath sounds. No wheezing.  Abdominal:     General: Bowel sounds are normal. There is no distension.     Palpations: Abdomen is soft.     Tenderness: There is no abdominal tenderness.  Musculoskeletal:        General: No tenderness. Normal range of motion.     Cervical back: Normal range of motion and neck supple.  Skin:    General: Skin is warm and dry.  Neurological:     Mental Status: She is alert and oriented to person, place, and time.     Cranial Nerves: No cranial nerve deficit.     Deep Tendon Reflexes: Reflexes are normal and symmetric.  Psychiatric:        Behavior: Behavior normal.        Thought Content: Thought content normal.  Judgment: Judgment normal.      BP 129/86    Pulse 72    Temp (!) 97 F (36.1 C) (Temporal)    Ht 5' 6" (1.676 m)    Wt 176 lb (79.8 kg)    BMI 28.41 kg/m      Assessment & Plan:  Abigail Moss comes in today with chief complaint of Medical Management of Chronic Issues   Diagnosis and orders addressed:  1. Osteoporosis, unspecified osteoporosis type, unspecified pathological fracture presence - alendronate (FOSAMAX) 70 MG tablet; TAKE 1 TABLET BY MOUTH  WEEKLY  WITH 8 OZ OF PLAIN  WATER 30 MINUTES BEFORE  FIRST FOOD, DRINK OR MEDS.  STAY UPRIGHT FOR 30 MINS  Dispense: 12 tablet; Refill: 0 - CMP14+EGFR - CBC with Differential/Platelet  2. Benzodiazepine dependence (HCC) - ALPRAZolam (XANAX) 0.5 MG tablet; Take 1 tablet (0.5 mg total) by mouth at bedtime as needed.  Dispense: 30 tablet; Refill: 3 - CMP14+EGFR - CBC with Differential/Platelet  3. Controlled substance agreement signed - ALPRAZolam (XANAX) 0.5 MG tablet; Take 1 tablet (0.5 mg total) by mouth at bedtime as needed.  Dispense: 30 tablet; Refill: 3 - CMP14+EGFR - CBC with Differential/Platelet  4. GAD (generalized anxiety disorder) - ALPRAZolam (XANAX) 0.5 MG tablet; Take 1 tablet (0.5 mg total) by mouth at bedtime as needed.  Dispense: 30 tablet; Refill: 3 - buPROPion (WELLBUTRIN XL) 150 MG 24 hr tablet; Take 1 tablet (150 mg total) by mouth daily.  Dispense: 90 tablet; Refill: 0 - CMP14+EGFR - CBC with Differential/Platelet  5. Seasonal allergic rhinitis due to pollen - fluticasone (FLONASE) 50 MCG/ACT nasal spray; SPRAY 2 SPRAYS INTO EACH NOSTRIL EVERY DAY  Dispense: 48 mL; Refill: 1 - CMP14+EGFR - CBC with Differential/Platelet  6. Essential hypertension - losartan (COZAAR) 100 MG tablet; Take 1 tablet (100 mg total) by mouth daily.  Dispense: 90 tablet; Refill: 1 - CMP14+EGFR - CBC with Differential/Platelet  7. Gastroesophageal reflux disease - omeprazole (PRILOSEC) 40 MG capsule; Take 1 capsule (40 mg total) by mouth daily.  Dispense: 90 capsule; Refill: 0 - CMP14+EGFR - CBC with Differential/Platelet  8. Primary hypertension - CMP14+EGFR - CBC with Differential/Platelet  9. Gastroesophageal reflux disease, unspecified whether esophagitis present - CMP14+EGFR - CBC with Differential/Platelet  10. Hypothyroidism, unspecified type  - CMP14+EGFR - CBC with Differential/Platelet - TSH  11. Obesity (BMI 30-39.9)  - CMP14+EGFR - CBC with  Differential/Platelet   Labs pending Patient reviewed in [place] controlled database, no flags noted. Contract and drug screen are up to date.  Health Maintenance reviewed Diet and exercise encouraged  Follow up plan: 3 months   [name], FNP

## 2021-07-04 NOTE — Patient Instructions (Signed)
Health Maintenance After Age 77 After age 77, you are at a higher risk for certain long-term diseases and infections as well as injuries from falls. Falls are a major cause of broken bones and head injuries in people who are older than age 77. Getting regular preventive care can help to keep you healthy and well. Preventive care includes getting regular testing and making lifestyle changes as recommended by your health care provider. Talk with your health care provider about: Which screenings and tests you should have. A screening is a test that checks for a disease when you have no symptoms. A diet and exercise plan that is right for you. What should I know about screenings and tests to prevent falls? Screening and testing are the best ways to find a health problem early. Early diagnosis and treatment give you the best chance of managing medical conditions that are common after age 77. Certain conditions and lifestyle choices may make you more likely to have a fall. Your health care provider may recommend: Regular vision checks. Poor vision and conditions such as cataracts can make you more likely to have a fall. If you wear glasses, make sure to get your prescription updated if your vision changes. Medicine review. Work with your health care provider to regularly review all of the medicines you are taking, including over-the-counter medicines. Ask your health care provider about any side effects that may make you more likely to have a fall. Tell your health care provider if any medicines that you take make you feel dizzy or sleepy. Strength and balance checks. Your health care provider may recommend certain tests to check your strength and balance while standing, walking, or changing positions. Foot health exam. Foot pain and numbness, as well as not wearing proper footwear, can make you more likely to have a fall. Screenings, including: Osteoporosis screening. Osteoporosis is a condition that causes  the bones to get weaker and break more easily. Blood pressure screening. Blood pressure changes and medicines to control blood pressure can make you feel dizzy. Depression screening. You may be more likely to have a fall if you have a fear of falling, feel depressed, or feel unable to do activities that you used to do. Alcohol use screening. Using too much alcohol can affect your balance and may make you more likely to have a fall. Follow these instructions at home: Lifestyle Do not drink alcohol if: Your health care provider tells you not to drink. If you drink alcohol: Limit how much you have to: 0-1 drink a day for women. 0-2 drinks a day for men. Know how much alcohol is in your drink. In the [place] one drink equals one 12 oz bottle of beer (355 mL), one 5 oz glass of wine (148 mL), or one 1 oz glass of hard liquor (44 mL). Do not use any products that contain nicotine or tobacco. These products include cigarettes, chewing tobacco, and vaping devices, such as e-cigarettes. If you need help quitting, ask your health care provider. Activity  Follow a regular exercise program to stay fit. This will help you maintain your balance. Ask your health care provider what types of exercise are appropriate for you. If you need a cane or walker, use it as recommended by your health care provider. Wear supportive shoes that have nonskid soles. Safety  Remove any tripping hazards, such as rugs, cords, and clutter. Install safety equipment such as grab bars in bathrooms and safety rails on stairs. Keep rooms and walkways   well-lit. General instructions Talk with your health care provider about your risks for falling. Tell your health care provider if: You fall. Be sure to tell your health care provider about all falls, even ones that seem minor. You feel dizzy, tiredness (fatigue), or off-balance. Take over-the-counter and prescription medicines only as told by your health care provider. These include  supplements. Eat a healthy diet and maintain a healthy weight. A healthy diet includes low-fat dairy products, low-fat (lean) meats, and fiber from whole grains, beans, and lots of fruits and vegetables. Stay current with your vaccines. Schedule regular health, dental, and eye exams. Summary Having a healthy lifestyle and getting preventive care can help to protect your health and wellness after age 77. Screening and testing are the best way to find a health problem early and help you avoid having a fall. Early diagnosis and treatment give you the best chance for managing medical conditions that are more common for people who are older than age 77. Falls are a major cause of broken bones and head injuries in people who are older than age 77. Take precautions to prevent a fall at home. Work with your health care provider to learn what changes you can make to improve your health and wellness and to prevent falls. This information is not intended to replace advice given to you by your health care provider. Make sure you discuss any questions you have with your health care provider. Document Revised: 11/12/2020 Document Reviewed: 11/12/2020 Elsevier Patient Education  2022 Elsevier Inc.  

## 2021-07-05 ENCOUNTER — Other Ambulatory Visit: Payer: Self-pay | Admitting: Family

## 2021-07-05 LAB — CMP14+EGFR
ALT: 19 IU/L (ref 0–32)
AST: 19 IU/L (ref 0–40)
Albumin/Globulin Ratio: 1.7 (ref 1.2–2.2)
Albumin: 4.1 g/dL (ref 3.7–4.7)
Alkaline Phosphatase: 71 IU/L (ref 44–121)
BUN/Creatinine Ratio: 18 (ref 12–28)
BUN: 16 mg/dL (ref 8–27)
Bilirubin Total: 0.3 mg/dL (ref 0.0–1.2)
CO2: 25 mmol/L (ref 20–29)
Calcium: 8.9 mg/dL (ref 8.7–10.3)
Chloride: 106 mmol/L (ref 96–106)
Creatinine, Ser: 0.9 mg/dL (ref 0.57–1.00)
Globulin, Total: 2.4 g/dL (ref 1.5–4.5)
Glucose: 89 mg/dL (ref 70–99)
Potassium: 4.4 mmol/L (ref 3.5–5.2)
Sodium: 143 mmol/L (ref 134–144)
Total Protein: 6.5 g/dL (ref 6.0–8.5)
eGFR: 66 mL/min/{1.73_m2} (ref >59)

## 2021-07-05 LAB — CBC WITH DIFFERENTIAL/PLATELET
Basophils Absolute: 0.1 10*3/uL (ref 0.0–0.2)
Basos: 2 %
EOS (ABSOLUTE): 0.2 10*3/uL (ref 0.0–0.4)
Eos: 4 %
Hematocrit: 38.9 % (ref 34.0–46.6)
Hemoglobin: 12.8 g/dL (ref 11.1–15.9)
Immature Grans (Abs): 0 10*3/uL (ref 0.0–0.1)
Immature Granulocytes: 0 %
Lymphocytes Absolute: 1.6 10*3/uL (ref 0.7–3.1)
Lymphs: 38 %
MCH: 30.8 pg (ref 26.6–33.0)
MCHC: 32.9 g/dL (ref 31.5–35.7)
MCV: 94 fL (ref 79–97)
Monocytes Absolute: 0.4 10*3/uL (ref 0.1–0.9)
Monocytes: 10 %
Neutrophils Absolute: 2 10*3/uL (ref 1.4–7.0)
Neutrophils: 46 %
Platelets: 266 10*3/uL (ref 150–450)
RBC: 4.16 x10E6/uL (ref 3.77–5.28)
RDW: 12.5 % (ref 11.7–15.4)
WBC: 4.3 10*3/uL (ref 3.4–10.8)

## 2021-07-05 LAB — TSH: TSH: 0.44 u[IU]/mL — ABNORMAL LOW (ref 0.450–4.500)

## 2021-07-05 MED ORDER — LEVOTHYROXINE SODIUM 112 MCG PO TABS: 112.0000 ug | ORAL_TABLET | Freq: Every day | ORAL | 1 refills | Status: DC

## 2021-07-08 ENCOUNTER — Other Ambulatory Visit: Payer: Self-pay | Admitting: Family

## 2021-07-25 ENCOUNTER — Other Ambulatory Visit: Payer: Self-pay | Admitting: Family

## 2021-07-25 DIAGNOSIS — F411 Generalized anxiety disorder: Secondary | ICD-10-CM

## 2021-07-25 DIAGNOSIS — K219 Gastro-esophageal reflux disease without esophagitis: Secondary | ICD-10-CM

## 2021-07-26 ENCOUNTER — Telehealth: Payer: Self-pay | Admitting: Family

## 2021-07-26 NOTE — Telephone Encounter (Signed)
[name] Needs 3 mos ckup in end of March. Refills sent

## 2021-07-26 NOTE — Telephone Encounter (Signed)
Patient advised to take zyrtec and drink water can keep taking the mucenix and can use nasal spray. Pt aware to call and make appt if not better next week.

## 2021-07-26 NOTE — Telephone Encounter (Signed)
Made appt for 3/23 at 11:25

## 2021-07-29 ENCOUNTER — Encounter: Payer: Self-pay | Admitting: Nurse Practitioner

## 2021-07-29 ENCOUNTER — Ambulatory Visit (INDEPENDENT_AMBULATORY_CARE_PROVIDER_SITE_OTHER): Payer: Medicare Other | Admitting: Nurse Practitioner

## 2021-07-29 DIAGNOSIS — J069 Acute upper respiratory infection, unspecified: Secondary | ICD-10-CM | POA: Diagnosis not present

## 2021-07-29 MED ORDER — AMOXICILLIN 875 MG PO TABS: 875.0000 mg | ORAL_TABLET | Freq: Two times a day (BID) | ORAL | 0 refills | Status: DC

## 2021-07-29 MED ORDER — PREDNISONE 10 MG (21) PO TBPK: ORAL_TABLET | ORAL | 0 refills | Status: DC

## 2021-07-29 NOTE — Progress Notes (Signed)
° °  Virtual Visit  Note Due to COVID-19 pandemic this visit was conducted virtually. This visit type was conducted due to national recommendations for restrictions regarding the COVID-19 Pandemic (e.g. social distancing, sheltering in place) in an effort to limit this patient's exposure and mitigate transmission in our community. All issues noted in this document were discussed and addressed.  A physical exam was not performed with this format.  I connected with Abigail Moss on 07/29/21 at 11:00 AM by telephone and verified that I am speaking with the correct person using two identifiers. Abigail Moss is currently located at home during visit. The provider, Ivy Lynn, NP is located in their office at time of visit.  I discussed the limitations, risks, security and privacy concerns of performing an evaluation and management service by telephone and the availability of in person appointments. I also discussed with the patient that there may be a patient responsible charge related to this service. The patient expressed understanding and agreed to proceed.   History and Present Illness:  URI  This is a new problem. Episode onset: 4 days. The problem has been unchanged. There has been no fever. Associated symptoms include congestion, coughing, headaches, sinus pain, sneezing and swollen glands. Pertinent negatives include no abdominal pain, ear pain, rash or sore throat. She has tried nothing for the symptoms.     Review of Systems  Constitutional:  Positive for chills and malaise/fatigue. Negative for fever.  HENT:  Positive for congestion, sinus pain and sneezing. Negative for ear pain and sore throat.   Respiratory:  Positive for cough. Negative for shortness of breath.   Cardiovascular: Negative.   Gastrointestinal:  Negative for abdominal pain.  Genitourinary: Negative.   Skin:  Negative for rash.  Neurological:  Positive for headaches.    Observations/Objective: Televisit patient  not in distress  Assessment and Plan: Take meds as prescribed - Use a cool mist humidifier  -Use saline nose sprays frequently -Force fluids -For fever or aches or pains- take Tylenol or ibuprofen. -Amoxicillin 875 mg tablet by mouth -Prednisone taper -If symptoms do not improve, she may need to be COVID tested to rule this out   Follow Up Instructions: Follow up with worsening unresolved symptoms     I discussed the assessment and treatment plan with the patient. The patient was provided an opportunity to ask questions and all were answered. The patient agreed with the plan and demonstrated an understanding of the instructions.   The patient was advised to call back or seek an in-person evaluation if the symptoms worsen or if the condition fails to improve as anticipated.  The above assessment and management plan was discussed with the patient. The patient verbalized understanding of and has agreed to the management plan. Patient is aware to call the clinic if symptoms persist or worsen. Patient is aware when to return to the clinic for a follow-up visit. Patient educated on when it is appropriate to go to the emergency department.   Time call ended: 11:10 AM  I provided 10 minutes of  non face-to-face time during this encounter.    Ivy Lynn, NP

## 2021-08-15 DIAGNOSIS — M5136 Other intervertebral disc degeneration, lumbar region: Secondary | ICD-10-CM | POA: Diagnosis not present

## 2021-08-15 DIAGNOSIS — G894 Chronic pain syndrome: Secondary | ICD-10-CM | POA: Diagnosis not present

## 2021-09-05 ENCOUNTER — Encounter: Payer: Self-pay | Admitting: Family Medicine

## 2021-09-05 ENCOUNTER — Ambulatory Visit (INDEPENDENT_AMBULATORY_CARE_PROVIDER_SITE_OTHER): Payer: Medicare Other | Admitting: Family Medicine

## 2021-09-05 VITALS — BP 150/94 | HR 70 | Temp 97.4°F | Ht 66.0 in | Wt 181.0 lb

## 2021-09-05 DIAGNOSIS — I10 Essential (primary) hypertension: Secondary | ICD-10-CM | POA: Diagnosis not present

## 2021-09-05 DIAGNOSIS — H60333 Swimmer's ear, bilateral: Secondary | ICD-10-CM

## 2021-09-05 MED ORDER — CIPROFLOXACIN-DEXAMETHASONE 0.3-0.1 % OT SUSP: 4.0000 [drp] | Freq: Two times a day (BID) | OTIC | 0 refills | Status: DC

## 2021-09-05 NOTE — Progress Notes (Signed)
? ?Assessment & Plan:  ?1. Acute swimmer's ear of both sides ?Education provided on otitis externa.  ?- ciprofloxacin-dexamethasone (CIPRODEX) OTIC suspension; Place 4 drops into both ears 2 (two) times daily for 7 days.  Dispense: 7.5 mL; Refill: 0 ? ?2. Primary hypertension ?Chart review shows her previously controlled. Encouraged to monitor BP at home and let us know if consistently >150/90.  ? ? ?Follow up plan: Return if symptoms worsen or fail to improve. ? ?Deliah Boston, MSN, APRN, FNP-C ?[facility] ? ?Subjective:  ? ?Patient ID: Abigail Moss, female    DOB: [birth year], 76 y.o.   MRN: [id number] ? ?HPI: ?Abigail Moss is a 76 y.o. female presenting on 09/05/2021 for Ear Pain (Bilateral but worse in right x 4 days ) ? ?Patient reports bilaterally ear pain that is worse on the right side that started 4 days ago. She does have tubes in her ears.  ? ?Patient's blood pressure is elevated today. She states she checks her blood pressure at home and always gets good readings. She feels her blood pressure is elevated because of the pain in her ears. ? ? ?ROS: Negative unless specifically indicated above in HPI.  ? ?Relevant past medical history reviewed and updated as indicated.  ? ?Allergies and medications reviewed and updated. ? ? ?Current Outpatient Medications:  ?  albuterol (VENTOLIN HFA) 108 (90 Base) MCG/ACT inhaler, TAKE 2 PUFFS BY MOUTH EVERY 6 HOURS AS NEEDED FOR WHEEZE OR SHORTNESS OF BREATH, Disp: 18 g, Rfl: 0 ?  alendronate (FOSAMAX) 70 MG tablet, TAKE 1 TABLET BY MOUTH  WEEKLY WITH 8 OZ OF PLAIN  WATER 30 MINUTES BEFORE  FIRST FOOD, DRINK OR MEDS.  STAY UPRIGHT FOR 30 MINS, Disp: 12 tablet, Rfl: 0 ?  ALPRAZolam (XANAX) 0.5 MG tablet, Take 1 tablet (0.5 mg total) by mouth at bedtime as needed., Disp: 30 tablet, Rfl: 3 ?  aspirin EC 81 MG tablet, Take 81 mg by mouth daily., Disp: , Rfl:  ?  buPROPion (WELLBUTRIN XL) 150 MG 24 hr tablet, TAKE 1 TABLET BY MOUTH DAILY, Disp: 90 tablet,  Rfl: 0 ?  Calcium Citrate-Vitamin D (CALCIUM CITRATE + PO), Take 600 mg by mouth daily., Disp: , Rfl:  ?  Cholecalciferol (VITAMIN D-3 PO), Take 800 mg by mouth daily., Disp: , Rfl:  ?  diclofenac (VOLTAREN) 75 MG EC tablet, TAKE 1 TABLET BY MOUTH  TWICE DAILY AS NEEDED, Disp: 180 tablet, Rfl: 3 ?  fluticasone (FLONASE) 50 MCG/ACT nasal spray, SPRAY 2 SPRAYS INTO EACH NOSTRIL EVERY DAY, Disp: 48 mL, Rfl: 1 ?  HYDROcodone-acetaminophen (NORCO) 10-325 MG tablet, Take 1 tablet by mouth 3 (three) times daily as needed., Disp: , Rfl:  ?  levothyroxine (SYNTHROID) 112 MCG tablet, Take 1 tablet (112 mcg total) by mouth daily., Disp: 90 tablet, Rfl: 1 ?  losartan (COZAAR) 100 MG tablet, Take 1 tablet (100 mg total) by mouth daily., Disp: 90 tablet, Rfl: 1 ?  montelukast (SINGULAIR) 10 MG tablet, TAKE 1 TABLET BY MOUTH DAILY AT  BEDTIME, Disp: 90 tablet, Rfl: 0 ?  MULTIPLE VITAMIN PO, Take by mouth., Disp: , Rfl:  ?  omeprazole (PRILOSEC) 40 MG capsule, TAKE 1 CAPSULE BY MOUTH DAILY, Disp: 90 capsule, Rfl: 0 ? ?Allergies  ?Allergen Reactions  ? Baclofen   ? Elemental Sulfur   ?  REACTION: itching  ? Gabapentin   ? Meloxicam   ? Neomycin   ?  REACTION: itching  ? Latex Rash  ? ? ?  Objective:  ? ?BP (!) 150/94   Pulse 70   Temp (!) 97.4 ?F (36.3 ?C) (Temporal)   Ht 5\' 6"  (1.676 m)   Wt 181 lb (82.1 kg)   BMI 29.21 kg/m?   ? ?Physical Exam ?Vitals reviewed.  ?Constitutional:   ?   General: She is not in acute distress. ?   Appearance: Normal appearance. She is not ill-appearing, toxic-appearing or diaphoretic.  ?HENT:  ?   Head: Normocephalic and atraumatic.  ?   Right Ear: Tympanic membrane and external ear normal. Drainage and tenderness present. No middle ear effusion. There is no impacted cerumen. Tympanic membrane is not injected, perforated, erythematous, retracted or bulging.  ?   Left Ear: Tympanic membrane and external ear normal. Drainage and tenderness present.  No middle ear effusion. There is no impacted  cerumen. Tympanic membrane is not injected, perforated, erythematous, retracted or bulging.  ?Eyes:  ?   General: No scleral icterus.    ?   Right eye: No discharge.     ?   Left eye: No discharge.  ?   Conjunctiva/sclera: Conjunctivae normal.  ?Cardiovascular:  ?   Rate and Rhythm: Normal rate.  ?Pulmonary:  ?   Effort: Pulmonary effort is normal. No respiratory distress.  ?Musculoskeletal:     ?   General: Normal range of motion.  ?   Cervical back: Normal range of motion.  ?Skin: ?   General: Skin is warm and dry.  ?   Capillary Refill: Capillary refill takes less than 2 seconds.  ?Neurological:  ?   General: No focal deficit present.  ?   Mental Status: She is alert and oriented to person, place, and time. Mental status is at baseline.  ?Psychiatric:     ?   Mood and Affect: Mood normal.     ?   Behavior: Behavior normal.     ?   Thought Content: Thought content normal.     ?   Judgment: Judgment normal.  ? ? ? ? ? ? ?

## 2021-09-06 ENCOUNTER — Telehealth: Payer: Self-pay | Admitting: Family

## 2021-09-06 DIAGNOSIS — H60333 Swimmer's ear, bilateral: Secondary | ICD-10-CM

## 2021-09-06 MED ORDER — OFLOXACIN 0.3 % OT SOLN: 10.0000 [drp] | Freq: Every day | OTIC | 0 refills | Status: AC

## 2021-09-06 NOTE — Telephone Encounter (Signed)
Seen Abigail Moss 03/02. Please advise  ?

## 2021-09-06 NOTE — Telephone Encounter (Signed)
New prescription sent

## 2021-09-06 NOTE — Telephone Encounter (Signed)
Patient aware.

## 2021-09-06 NOTE — Telephone Encounter (Signed)
Ear drops that were called in 3/2 are over $100 and patient said she is unable to pay this for the drops. Patient is feeling worse today and wants to know if something else can be called in.  ?

## 2021-09-10 DIAGNOSIS — H5203 Hypermetropia, bilateral: Secondary | ICD-10-CM | POA: Diagnosis not present

## 2021-09-26 ENCOUNTER — Ambulatory Visit (INDEPENDENT_AMBULATORY_CARE_PROVIDER_SITE_OTHER): Payer: Medicare Other | Admitting: Family

## 2021-09-26 ENCOUNTER — Encounter: Payer: Self-pay | Admitting: Family

## 2021-09-26 VITALS — BP 135/85 | HR 76 | Temp 97.5°F | Ht 66.0 in | Wt 179.4 lb

## 2021-09-26 DIAGNOSIS — I10 Essential (primary) hypertension: Secondary | ICD-10-CM | POA: Diagnosis not present

## 2021-09-26 DIAGNOSIS — K219 Gastro-esophageal reflux disease without esophagitis: Secondary | ICD-10-CM

## 2021-09-26 DIAGNOSIS — F1320 Sedative, hypnotic or anxiolytic dependence, uncomplicated: Secondary | ICD-10-CM | POA: Diagnosis not present

## 2021-09-26 DIAGNOSIS — Z79899 Other long term (current) drug therapy: Secondary | ICD-10-CM | POA: Diagnosis not present

## 2021-09-26 DIAGNOSIS — R062 Wheezing: Secondary | ICD-10-CM

## 2021-09-26 DIAGNOSIS — E669 Obesity, unspecified: Secondary | ICD-10-CM

## 2021-09-26 DIAGNOSIS — E559 Vitamin D deficiency, unspecified: Secondary | ICD-10-CM

## 2021-09-26 DIAGNOSIS — F411 Generalized anxiety disorder: Secondary | ICD-10-CM

## 2021-09-26 DIAGNOSIS — E039 Hypothyroidism, unspecified: Secondary | ICD-10-CM | POA: Diagnosis not present

## 2021-09-26 MED ORDER — ALBUTEROL SULFATE HFA 108 (90 BASE) MCG/ACT IN AERS: INHALATION_SPRAY | RESPIRATORY_TRACT | 0 refills | Status: DC

## 2021-09-26 MED ORDER — OFLOXACIN 0.3 % OT SOLN: 5.0000 [drp] | Freq: Every day | OTIC | 0 refills | Status: DC

## 2021-09-26 NOTE — Progress Notes (Signed)
? ?Subjective:  ? ? Patient ID: Abigail Moss, female    DOB: 11/03/1945, 76 y.o.   MRN: [id number] ? ?Chief Complaint  ?Patient presents with  ? Medical Management of Chronic Issues  ?  CHECKUP  ? Hypothyroidism  ?  LOW THYROID  ? ?Pt presents to the office today for chronic follow up. She is drinking Ensure as needed since COVID she has not been able to get meats.   ?Hypertension ?This is a chronic problem. The current episode started more than 1 year ago. The problem has been resolved since onset. The problem is controlled. Associated symptoms include anxiety and malaise/fatigue. Pertinent negatives include no peripheral edema or shortness of breath. Risk factors for coronary artery disease include dyslipidemia, obesity and sedentary lifestyle. The current treatment provides moderate improvement. Identifiable causes of hypertension include a thyroid problem.  ?Gastroesophageal Reflux ?She complains of belching and heartburn. She reports no hoarse voice. This is a chronic problem. The current episode started more than 1 year ago. The problem occurs occasionally. Associated symptoms include fatigue. She has tried a PPI for the symptoms. The treatment provided moderate relief.  ?Thyroid Problem ?Presents for follow-up visit. Symptoms include constipation and fatigue. Patient reports no anxiety, depressed mood or hoarse voice. The symptoms have been stable.  ?Anxiety ?Presents for follow-up visit. Symptoms include excessive worry and irritability. Patient reports no confusion, depressed mood, nervous/anxious behavior or shortness of breath. Symptoms occur most days.  ? ? ? ? ? ?Review of Systems  ?Constitutional:  Positive for fatigue, irritability and malaise/fatigue.  ?HENT:  Negative for hoarse voice.   ?Respiratory:  Negative for shortness of breath.   ?Gastrointestinal:  Positive for constipation and heartburn.  ?Psychiatric/Behavioral:  Negative for confusion. The patient is not nervous/anxious.   ?All other  systems reviewed and are negative. ? ?   ?Objective:  ? Physical Exam ?Vitals reviewed.  ?Constitutional:   ?   General: She is not in acute distress. ?   Appearance: She is well-developed.  ?HENT:  ?   Head: Normocephalic and atraumatic.  ?   Right Ear: Tympanic membrane normal. Drainage present. No swelling or tenderness. A PE tube is present. Tympanic membrane is not erythematous.  ?   Left Ear: Tympanic membrane normal. Drainage present. No swelling or tenderness. A PE tube is present. Tympanic membrane is not erythematous.  ?Eyes:  ?   Pupils: Pupils are equal, round, and reactive to light.  ?Neck:  ?   Thyroid: No thyromegaly.  ?Cardiovascular:  ?   Rate and Rhythm: Normal rate and regular rhythm.  ?   Heart sounds: Normal heart sounds. No murmur heard. ?Pulmonary:  ?   Effort: Pulmonary effort is normal. No respiratory distress.  ?   Breath sounds: Normal breath sounds. No wheezing.  ?Abdominal:  ?   General: Bowel sounds are normal. There is no distension.  ?   Palpations: Abdomen is soft.  ?   Tenderness: There is no abdominal tenderness.  ?Musculoskeletal:     ?   General: No tenderness. Normal range of motion.  ?   Cervical back: Normal range of motion and neck supple.  ?Skin: ?   General: Skin is warm and dry.  ?Neurological:  ?   Mental Status: She is alert and oriented to person, place, and time.  ?   Cranial Nerves: No cranial nerve deficit.  ?   Deep Tendon Reflexes: Reflexes are normal and symmetric.  ?Psychiatric:     ?  Behavior: Behavior normal.     ?   Thought Content: Thought content normal.     ?   Judgment: Judgment normal.  ? ? ? ? ?BP 135/85   Pulse 76   Temp (!) 97.5 ?F (36.4 ?C)   Ht '5\' 6"'  (1.676 m)   Wt 179 lb 6.4 oz (81.4 kg)   SpO2 96%   BMI 28.96 kg/m?  ? ?   ?Assessment & Plan:  ?Abigail Moss comes in today with chief complaint of Medical Management of Chronic Issues (CHECKUP) and Hypothyroidism (LOW THYROID) ? ? ?Diagnosis and orders addressed: ? ?1. Primary hypertension ?-  CMP14+EGFR ?- CBC with Differential/Platelet ? ?2. Gastroesophageal reflux disease, unspecified whether esophagitis present ?- CMP14+EGFR ?- CBC with Differential/Platelet ? ?3. Hypothyroidism, unspecified type ?- CMP14+EGFR ?- CBC with Differential/Platelet ?- TSH ? ?4. Benzodiazepine dependence ([place]) ?- CMP14+EGFR ?- CBC with Differential/Platelet ? ?5. Controlled substance agreement signed ?- CMP14+EGFR ?- CBC with Differential/Platelet ? ?6. GAD (generalized anxiety disorder) ?- CMP14+EGFR ?- CBC with Differential/Platelet ? ?7. Vitamin D deficiency ?- CMP14+EGFR ?- CBC with Differential/Platelet ? ?8. Obesity (BMI 30-39.9) ?- CMP14+EGFR ?- CBC with Differential/Platelet ? ?9. Wheezing ?- albuterol (VENTOLIN HFA) 108 (90 Base) MCG/ACT inhaler; TAKE 2 PUFFS BY MOUTH EVERY 6 HOURS AS NEEDED FOR WHEEZE OR SHORTNESS OF BREATH  Dispense: 18 g; Refill: 0 ?- CMP14+EGFR ?- CBC with Differential/Platelet ? ? ?Labs pending ?Pt does not want refill of xanax at this time.  ?Health Maintenance reviewed ?Diet and exercise encouraged ? ?Follow up plan: ?3 months  ? ? ?[name], FNP ? ? ? ?

## 2021-09-26 NOTE — Patient Instructions (Signed)
Health Maintenance After Age 78 After age 78, you are at a higher risk for certain long-term diseases and infections as well as injuries from falls. Falls are a major cause of broken bones and head injuries in people who are older than age 78. Getting regular preventive care can help to keep you healthy and well. Preventive care includes getting regular testing and making lifestyle changes as recommended by your health care provider. Talk with your health care provider about: Which screenings and tests you should have. A screening is a test that checks for a disease when you have no symptoms. A diet and exercise plan that is right for you. What should I know about screenings and tests to prevent falls? Screening and testing are the best ways to find a health problem early. Early diagnosis and treatment give you the best chance of managing medical conditions that are common after age 78. Certain conditions and lifestyle choices may make you more likely to have a fall. Your health care provider may recommend: Regular vision checks. Poor vision and conditions such as cataracts can make you more likely to have a fall. If you wear glasses, make sure to get your prescription updated if your vision changes. Medicine review. Work with your health care provider to regularly review all of the medicines you are taking, including over-the-counter medicines. Ask your health care provider about any side effects that may make you more likely to have a fall. Tell your health care provider if any medicines that you take make you feel dizzy or sleepy. Strength and balance checks. Your health care provider may recommend certain tests to check your strength and balance while standing, walking, or changing positions. Foot health exam. Foot pain and numbness, as well as not wearing proper footwear, can make you more likely to have a fall. Screenings, including: Osteoporosis screening. Osteoporosis is a condition that causes  the bones to get weaker and break more easily. Blood pressure screening. Blood pressure changes and medicines to control blood pressure can make you feel dizzy. Depression screening. You may be more likely to have a fall if you have a fear of falling, feel depressed, or feel unable to do activities that you used to do. Alcohol use screening. Using too much alcohol can affect your balance and may make you more likely to have a fall. Follow these instructions at home: Lifestyle Do not drink alcohol if: Your health care provider tells you not to drink. If you drink alcohol: Limit how much you have to: 0-1 drink a day for women. 0-2 drinks a day for men. Know how much alcohol is in your drink. In the [place] one drink equals one 12 oz bottle of beer (355 mL), one 5 oz glass of wine (148 mL), or one 1 oz glass of hard liquor (44 mL). Do not use any products that contain nicotine or tobacco. These products include cigarettes, chewing tobacco, and vaping devices, such as e-cigarettes. If you need help quitting, ask your health care provider. Activity  Follow a regular exercise program to stay fit. This will help you maintain your balance. Ask your health care provider what types of exercise are appropriate for you. If you need a cane or walker, use it as recommended by your health care provider. Wear supportive shoes that have nonskid soles. Safety  Remove any tripping hazards, such as rugs, cords, and clutter. Install safety equipment such as grab bars in bathrooms and safety rails on stairs. Keep rooms and walkways   well-lit. General instructions Talk with your health care provider about your risks for falling. Tell your health care provider if: You fall. Be sure to tell your health care provider about all falls, even ones that seem minor. You feel dizzy, tiredness (fatigue), or off-balance. Take over-the-counter and prescription medicines only as told by your health care provider. These include  supplements. Eat a healthy diet and maintain a healthy weight. A healthy diet includes low-fat dairy products, low-fat (lean) meats, and fiber from whole grains, beans, and lots of fruits and vegetables. Stay current with your vaccines. Schedule regular health, dental, and eye exams. Summary Having a healthy lifestyle and getting preventive care can help to protect your health and wellness after age 78. Screening and testing are the best way to find a health problem early and help you avoid having a fall. Early diagnosis and treatment give you the best chance for managing medical conditions that are more common for people who are older than age 78. Falls are a major cause of broken bones and head injuries in people who are older than age 78. Take precautions to prevent a fall at home. Work with your health care provider to learn what changes you can make to improve your health and wellness and to prevent falls. This information is not intended to replace advice given to you by your health care provider. Make sure you discuss any questions you have with your health care provider. Document Revised: 11/12/2020 Document Reviewed: 11/12/2020 Elsevier Patient Education  2022 [employer]  

## 2021-09-27 LAB — CBC WITH DIFFERENTIAL/PLATELET
Basophils Absolute: 0 10*3/uL (ref 0.0–0.2)
Basos: 1 %
EOS (ABSOLUTE): 0.2 10*3/uL (ref 0.0–0.4)
Eos: 6 %
Hematocrit: 37.6 % (ref 34.0–46.6)
Hemoglobin: 12.2 g/dL (ref 11.1–15.9)
Immature Grans (Abs): 0 10*3/uL (ref 0.0–0.1)
Immature Granulocytes: 0 %
Lymphocytes Absolute: 1.4 10*3/uL (ref 0.7–3.1)
Lymphs: 36 %
MCH: 30.7 pg (ref 26.6–33.0)
MCHC: 32.4 g/dL (ref 31.5–35.7)
MCV: 95 fL (ref 79–97)
Monocytes Absolute: 0.4 10*3/uL (ref 0.1–0.9)
Monocytes: 10 %
Neutrophils Absolute: 1.9 10*3/uL (ref 1.4–7.0)
Neutrophils: 47 %
Platelets: 257 10*3/uL (ref 150–450)
RBC: 3.98 x10E6/uL (ref 3.77–5.28)
RDW: 12.8 % (ref 11.7–15.4)
WBC: 4 10*3/uL (ref 3.4–10.8)

## 2021-09-27 LAB — CMP14+EGFR
ALT: 26 IU/L (ref 0–32)
AST: 23 IU/L (ref 0–40)
Albumin/Globulin Ratio: 2.1 (ref 1.2–2.2)
Albumin: 4.2 g/dL (ref 3.7–4.7)
Alkaline Phosphatase: 69 IU/L (ref 44–121)
BUN/Creatinine Ratio: 17 (ref 12–28)
BUN: 17 mg/dL (ref 8–27)
Bilirubin Total: 0.3 mg/dL (ref 0.0–1.2)
CO2: 25 mmol/L (ref 20–29)
Calcium: 9.1 mg/dL (ref 8.7–10.3)
Chloride: 103 mmol/L (ref 96–106)
Creatinine, Ser: 0.98 mg/dL (ref 0.57–1.00)
Globulin, Total: 2 g/dL (ref 1.5–4.5)
Glucose: 83 mg/dL (ref 70–99)
Potassium: 4.1 mmol/L (ref 3.5–5.2)
Sodium: 139 mmol/L (ref 134–144)
Total Protein: 6.2 g/dL (ref 6.0–8.5)
eGFR: 59 mL/min/{1.73_m2} — ABNORMAL LOW (ref >59)

## 2021-09-27 LAB — TSH: TSH: 2.2 u[IU]/mL (ref 0.450–4.500)

## 2021-09-29 ENCOUNTER — Other Ambulatory Visit: Payer: Self-pay | Admitting: Family

## 2021-09-29 DIAGNOSIS — M810 Age-related osteoporosis without current pathological fracture: Secondary | ICD-10-CM

## 2021-09-30 DIAGNOSIS — M5416 Radiculopathy, lumbar region: Secondary | ICD-10-CM | POA: Insufficient documentation

## 2021-10-08 ENCOUNTER — Telehealth: Payer: Self-pay | Admitting: *Deleted

## 2021-10-08 MED ORDER — PROAIR RESPICLICK 108 (90 BASE) MCG/ACT IN AEPB: 1.0000 | INHALATION_SPRAY | RESPIRATORY_TRACT | 1 refills | Status: DC

## 2021-10-08 NOTE — Telephone Encounter (Signed)
Ventolin HFA AER base is not covered by the pt's insurance. Please send alternative. ?

## 2021-10-08 NOTE — Telephone Encounter (Signed)
Prescription sent to pharmacy.

## 2021-10-10 ENCOUNTER — Telehealth: Payer: Self-pay | Admitting: Family

## 2021-10-10 MED ORDER — LEVOTHYROXINE SODIUM 112 MCG PO TABS: 112.0000 ug | ORAL_TABLET | Freq: Every day | ORAL | 1 refills | Status: DC

## 2021-10-10 NOTE — Telephone Encounter (Signed)
Patient notified of lab results and verbalized understanding. [facility] ?

## 2021-10-11 DIAGNOSIS — M5416 Radiculopathy, lumbar region: Secondary | ICD-10-CM | POA: Diagnosis not present

## 2021-10-25 ENCOUNTER — Telehealth: Payer: Self-pay | Admitting: *Deleted

## 2021-10-25 MED ORDER — PROAIR RESPICLICK 108 (90 BASE) MCG/ACT IN AEPB: 1.0000 | INHALATION_SPRAY | Freq: Four times a day (QID) | RESPIRATORY_TRACT | 1 refills | Status: DC | PRN

## 2021-10-25 NOTE — Telephone Encounter (Signed)
Fax from [facility] ?Clarification for directions on ProAir Respiclick Aer 90 mcg ?Rx was sent as inhale 1-2 puffs Q6 wks prn ?Fax sent back to [facility] to change to Inhale 1-2 puffs Q6 hrs prn ?Corrected directions for future refills, No Print ?

## 2021-11-04 ENCOUNTER — Encounter: Payer: Self-pay | Admitting: Family Medicine

## 2021-11-04 ENCOUNTER — Ambulatory Visit (INDEPENDENT_AMBULATORY_CARE_PROVIDER_SITE_OTHER): Payer: Medicare Other | Admitting: Family Medicine

## 2021-11-04 DIAGNOSIS — J0100 Acute maxillary sinusitis, unspecified: Secondary | ICD-10-CM | POA: Diagnosis not present

## 2021-11-04 MED ORDER — PREDNISONE 10 MG (21) PO TBPK: ORAL_TABLET | ORAL | 0 refills | Status: DC

## 2021-11-04 MED ORDER — AMOXICILLIN-POT CLAVULANATE 875-125 MG PO TABS: 1.0000 | ORAL_TABLET | Freq: Two times a day (BID) | ORAL | 0 refills | Status: AC

## 2021-11-04 NOTE — Progress Notes (Signed)
? ?  Virtual Visit  Note ?Due to COVID-19 pandemic this visit was conducted virtually. This visit type was conducted due to national recommendations for restrictions regarding the COVID-19 Pandemic (e.g. social distancing, sheltering in place) in an effort to limit this patient's exposure and mitigate transmission in our community. All issues noted in this document were discussed and addressed.  A physical exam was not performed with this format. ? ?I connected with Abigail Moss on 11/04/21 at 1656 by telephone and verified that I am speaking with the correct person using two identifiers. Abigail Moss is currently located at home and no one is currently with her during the visit. The provider, Gabriel Earing, FNP is located in their office at time of visit. ? ?I discussed the limitations, risks, security and privacy concerns of performing an evaluation and management service by telephone and the availability of in person appointments. I also discussed with the patient that there may be a patient responsible charge related to this service. The patient expressed understanding and agreed to proceed. ? ?CC: sinusitis ? ?History and Present Illness: ? ?HPI ?Reports chronic congestion from allergies. Reports facial pressure around nose with HA for 2 days. Also has cough and sore throat from drainage. Denies fever. She has been taking singular and flonase without improvement. Reports history of frequent sinusitis requiring antibiotics and prednisone for treatment. Denies shortness of breath, wheezing, or chest pain. ? ? ?ROS ?As per HPI.  ? ?Observations/Objective: ?Alert and oriented x 3. Able to speak in full sentences without difficulty.  ? ? ?Assessment and Plan: ?Abigail Moss was seen today for sinusitis. ? ?Diagnoses and all orders for this visit: ? ?Acute non-recurrent maxillary sinusitis ?Continue prescribed allergy medication. Nasal saline for congestion as well.  ?-     amoxicillin-clavulanate (AUGMENTIN) 875-125 MG  tablet; Take 1 tablet by mouth 2 (two) times daily for 7 days. ?-     predniSONE (STERAPRED UNI-PAK 21 TAB) 10 MG (21) TBPK tablet; Use as directed on back of pill pack ? ? ? ? ?Follow Up Instructions: ?Return to office for new or worsening symptoms, or if symptoms persist.  ? ?  ?I discussed the assessment and treatment plan with the patient. The patient was provided an opportunity to ask questions and all were answered. The patient agreed with the plan and demonstrated an understanding of the instructions. ?  ?The patient was advised to call back or seek an in-person evaluation if the symptoms worsen or if the condition fails to improve as anticipated. ? ?The above assessment and management plan was discussed with the patient. The patient verbalized understanding of and has agreed to the management plan. Patient is aware to call the clinic if symptoms persist or worsen. Patient is aware when to return to the clinic for a follow-up visit. Patient educated on when it is appropriate to go to the emergency department.  ? ?Time call ended:  1709 ? ?I provided 13 minutes of  non face-to-face time during this encounter. ? ? ? ?Gabriel Earing, FNP ? ? ?

## 2021-11-11 ENCOUNTER — Other Ambulatory Visit: Payer: Self-pay | Admitting: Family

## 2021-11-11 DIAGNOSIS — K219 Gastro-esophageal reflux disease without esophagitis: Secondary | ICD-10-CM

## 2021-11-11 DIAGNOSIS — Z5181 Encounter for therapeutic drug level monitoring: Secondary | ICD-10-CM | POA: Diagnosis not present

## 2021-11-11 DIAGNOSIS — M5416 Radiculopathy, lumbar region: Secondary | ICD-10-CM | POA: Diagnosis not present

## 2021-11-11 DIAGNOSIS — Z79891 Long term (current) use of opiate analgesic: Secondary | ICD-10-CM | POA: Diagnosis not present

## 2021-11-11 DIAGNOSIS — M5136 Other intervertebral disc degeneration, lumbar region: Secondary | ICD-10-CM | POA: Diagnosis not present

## 2021-11-12 ENCOUNTER — Other Ambulatory Visit: Payer: Self-pay | Admitting: Family

## 2021-11-20 ENCOUNTER — Other Ambulatory Visit: Payer: Self-pay | Admitting: Family

## 2021-11-20 DIAGNOSIS — F411 Generalized anxiety disorder: Secondary | ICD-10-CM

## 2021-12-10 ENCOUNTER — Other Ambulatory Visit: Payer: Self-pay | Admitting: Family

## 2021-12-10 DIAGNOSIS — I10 Essential (primary) hypertension: Secondary | ICD-10-CM

## 2021-12-11 ENCOUNTER — Encounter: Payer: Self-pay | Admitting: Family Medicine

## 2021-12-11 ENCOUNTER — Ambulatory Visit (INDEPENDENT_AMBULATORY_CARE_PROVIDER_SITE_OTHER): Payer: Medicare Other | Admitting: Family Medicine

## 2021-12-11 DIAGNOSIS — J208 Acute bronchitis due to other specified organisms: Secondary | ICD-10-CM | POA: Diagnosis not present

## 2021-12-11 MED ORDER — PREDNISONE 20 MG PO TABS: 40.0000 mg | ORAL_TABLET | Freq: Every day | ORAL | 0 refills | Status: AC

## 2021-12-11 NOTE — Progress Notes (Signed)
   Virtual Visit  Note Due to COVID-19 pandemic this visit was conducted virtually. This visit type was conducted due to national recommendations for restrictions regarding the COVID-19 Pandemic (e.g. social distancing, sheltering in place) in an effort to limit this patient's exposure and mitigate transmission in our community. All issues noted in this document were discussed and addressed.  A physical exam was not performed with this format.  I connected with Abigail Moss on 12/11/21 at [phone] by telephone and verified that I am speaking with the correct person using two identifiers. Abigail Moss is currently located at home and no one is currently with her during the visit. The provider, Gwenlyn Perking, FNP is located in their office at time of visit.  I discussed the limitations, risks, security and privacy concerns of performing an evaluation and management service by telephone and the availability of in person appointments. I also discussed with the patient that there may be a patient responsible charge related to this service. The patient expressed understanding and agreed to proceed.  CC: cough  History and Present Illness:  HPI Abigail Moss reports a productive cough x 2 days. She also reports sore throat, hoarseness, headache, and rhinorrhea. She denies fever, chills, myalgias, shortness of breath, wheezing, nausea, vomiting, or diarrhea. She has been taking singular and flonase daily. She did take mucinex yesterday. She denies sick contacts. She has a history of recurrent sinusitis.     ROS As per HPI.   Observations/Objective: Alert and oriented x 3. Able to speak in full sentences without difficulty.   Assessment and Plan: Abigail Moss was seen today for cough.  Diagnoses and all orders for this visit:  Viral bronchitis Continue mucinex BID and allergy medications. Use albuterol prn. Prednisone burst as below.  -     predniSONE (DELTASONE) 20 MG tablet; Take 2 tablets (40 mg total) by  mouth daily with breakfast for 3 days.     Follow Up Instructions: Return to office for new or worsening symptoms, or if symptoms persist.     I discussed the assessment and treatment plan with the patient. The patient was provided an opportunity to ask questions and all were answered. The patient agreed with the plan and demonstrated an understanding of the instructions.   The patient was advised to call back or seek an in-person evaluation if the symptoms worsen or if the condition fails to improve as anticipated.  The above assessment and management plan was discussed with the patient. The patient verbalized understanding of and has agreed to the management plan. Patient is aware to call the clinic if symptoms persist or worsen. Patient is aware when to return to the clinic for a follow-up visit. Patient educated on when it is appropriate to go to the emergency department.   Time call ended:  0945  I provided 11 minutes of  non face-to-face time during this encounter.    Gwenlyn Perking, FNP

## 2021-12-12 ENCOUNTER — Other Ambulatory Visit: Payer: Self-pay | Admitting: Family

## 2021-12-12 DIAGNOSIS — I10 Essential (primary) hypertension: Secondary | ICD-10-CM

## 2021-12-16 ENCOUNTER — Encounter: Payer: Self-pay | Admitting: Family Medicine

## 2021-12-16 ENCOUNTER — Ambulatory Visit (INDEPENDENT_AMBULATORY_CARE_PROVIDER_SITE_OTHER): Payer: Medicare Other | Admitting: Family Medicine

## 2021-12-16 DIAGNOSIS — J0101 Acute recurrent maxillary sinusitis: Secondary | ICD-10-CM | POA: Diagnosis not present

## 2021-12-16 MED ORDER — AZITHROMYCIN 250 MG PO TABS: ORAL_TABLET | ORAL | 0 refills | Status: AC

## 2021-12-16 NOTE — Progress Notes (Signed)
   Virtual Visit  Note Due to COVID-19 pandemic this visit was conducted virtually. This visit type was conducted due to national recommendations for restrictions regarding the COVID-19 Pandemic (e.g. social distancing, sheltering in place) in an effort to limit this patient's exposure and mitigate transmission in our community. All issues noted in this document were discussed and addressed.  A physical exam was not performed with this format.  I connected with Abigail Moss on 12/16/21 at 1135 by telephone and verified that I am speaking with the correct person using two identifiers. Abigail Moss is currently located at home and no one is currently with her during the visit. The provider, Gwenlyn Perking, FNP is located in their office at time of visit.  I discussed the limitations, risks, security and privacy concerns of performing an evaluation and management service by telephone and the availability of in person appointments. I also discussed with the patient that there may be a patient responsible charge related to this service. The patient expressed understanding and agreed to proceed.  CC: sinusitis  History and Present Illness:  HPI Abigail Moss reports cough and head congestion x 7 days. She now has increased facial pressure with tenderness around her cheeks.She denies fever, chills, shortness of breath, wheezing, or vomiting. She had a prednisone burst without much improvement. She has been taking mucinex without improvement. She has a history of recurrent sinusitis and is taking singular and flonase. She started PCN 500 mg QID two days ago that was prescribed by her dentist for a tooth infection. She has a root canal scheduled in a few days.    ROS As per HPI.  Observations/Objective: Alert and oriented x 3. Able to speak in full sentences without difficulty.   Assessment and Plan: Abigail Moss was seen today for sinusitis.  Diagnoses and all orders for this visit:  Acute recurrent maxillary  sinusitis Will add zpak as below. Continue mucinex, flonase, singular, nasal saline. Return to office for new or worsening symptoms, or if symptoms persist.  -     azithromycin (ZITHROMAX) 250 MG tablet; Take 2 tablets on day 1, then 1 tablet daily on days 2 through 5     Follow Up Instructions: As needed.     I discussed the assessment and treatment plan with the patient. The patient was provided an opportunity to ask questions and all were answered. The patient agreed with the plan and demonstrated an understanding of the instructions.   The patient was advised to call back or seek an in-person evaluation if the symptoms worsen or if the condition fails to improve as anticipated.  The above assessment and management plan was discussed with the patient. The patient verbalized understanding of and has agreed to the management plan. Patient is aware to call the clinic if symptoms persist or worsen. Patient is aware when to return to the clinic for a follow-up visit. Patient educated on when it is appropriate to go to the emergency department.   Time call ended:  1146  I provided 11 minutes of  non face-to-face time during this encounter.    Gwenlyn Perking, FNP

## 2021-12-26 ENCOUNTER — Ambulatory Visit (INDEPENDENT_AMBULATORY_CARE_PROVIDER_SITE_OTHER): Payer: Medicare Other | Admitting: Physician Assistant

## 2021-12-26 ENCOUNTER — Encounter: Payer: Self-pay | Admitting: Physician Assistant

## 2021-12-26 VITALS — BP 153/94 | HR 70 | Temp 97.2°F | Ht 66.0 in | Wt 177.8 lb

## 2021-12-26 DIAGNOSIS — J018 Other acute sinusitis: Secondary | ICD-10-CM | POA: Diagnosis not present

## 2021-12-26 MED ORDER — AMOXICILLIN 875 MG PO TABS: 875.0000 mg | ORAL_TABLET | Freq: Two times a day (BID) | ORAL | 0 refills | Status: AC

## 2021-12-26 MED ORDER — PROAIR RESPICLICK 108 (90 BASE) MCG/ACT IN AEPB
1.0000 | INHALATION_SPRAY | Freq: Four times a day (QID) | RESPIRATORY_TRACT | 1 refills | Status: AC | PRN
Start: 2021-12-26 — End: ?

## 2021-12-26 MED ORDER — ONDANSETRON HCL 4 MG PO TABS: 4.0000 mg | ORAL_TABLET | Freq: Three times a day (TID) | ORAL | 0 refills | Status: DC | PRN

## 2021-12-26 NOTE — Patient Instructions (Signed)

## 2021-12-29 ENCOUNTER — Other Ambulatory Visit: Payer: Self-pay | Admitting: Family

## 2022-01-07 ENCOUNTER — Other Ambulatory Visit: Payer: Self-pay | Admitting: Family

## 2022-01-07 DIAGNOSIS — M810 Age-related osteoporosis without current pathological fracture: Secondary | ICD-10-CM

## 2022-02-01 ENCOUNTER — Other Ambulatory Visit: Payer: Self-pay | Admitting: Family

## 2022-02-01 DIAGNOSIS — I10 Essential (primary) hypertension: Secondary | ICD-10-CM

## 2022-02-01 DIAGNOSIS — F411 Generalized anxiety disorder: Secondary | ICD-10-CM

## 2022-02-03 NOTE — Telephone Encounter (Signed)
[name] NTBS in Sept for 6 mos FU mail order NOT sent

## 2022-02-03 NOTE — Telephone Encounter (Signed)
Appt made for 02/04/2022

## 2022-02-04 ENCOUNTER — Encounter: Payer: Self-pay | Admitting: Family

## 2022-02-04 ENCOUNTER — Ambulatory Visit (INDEPENDENT_AMBULATORY_CARE_PROVIDER_SITE_OTHER): Payer: Medicare Other | Admitting: Family

## 2022-02-04 VITALS — BP 117/79 | HR 75 | Temp 98.2°F | Ht 66.0 in | Wt 179.0 lb

## 2022-02-04 DIAGNOSIS — F1320 Sedative, hypnotic or anxiolytic dependence, uncomplicated: Secondary | ICD-10-CM

## 2022-02-04 DIAGNOSIS — K219 Gastro-esophageal reflux disease without esophagitis: Secondary | ICD-10-CM | POA: Diagnosis not present

## 2022-02-04 DIAGNOSIS — M5136 Other intervertebral disc degeneration, lumbar region: Secondary | ICD-10-CM

## 2022-02-04 DIAGNOSIS — K5901 Slow transit constipation: Secondary | ICD-10-CM | POA: Insufficient documentation

## 2022-02-04 DIAGNOSIS — E559 Vitamin D deficiency, unspecified: Secondary | ICD-10-CM | POA: Diagnosis not present

## 2022-02-04 DIAGNOSIS — R6889 Other general symptoms and signs: Secondary | ICD-10-CM | POA: Diagnosis not present

## 2022-02-04 DIAGNOSIS — Z79899 Other long term (current) drug therapy: Secondary | ICD-10-CM | POA: Diagnosis not present

## 2022-02-04 DIAGNOSIS — F411 Generalized anxiety disorder: Secondary | ICD-10-CM | POA: Diagnosis not present

## 2022-02-04 DIAGNOSIS — I10 Essential (primary) hypertension: Secondary | ICD-10-CM

## 2022-02-04 DIAGNOSIS — Z79891 Long term (current) use of opiate analgesic: Secondary | ICD-10-CM | POA: Diagnosis not present

## 2022-02-04 DIAGNOSIS — E039 Hypothyroidism, unspecified: Secondary | ICD-10-CM | POA: Diagnosis not present

## 2022-02-04 DIAGNOSIS — Z23 Encounter for immunization: Secondary | ICD-10-CM

## 2022-02-04 DIAGNOSIS — E663 Overweight: Secondary | ICD-10-CM

## 2022-02-04 DIAGNOSIS — R5383 Other fatigue: Secondary | ICD-10-CM | POA: Diagnosis not present

## 2022-02-04 MED ORDER — TRULANCE 3 MG PO TABS: 3.0000 mg | ORAL_TABLET | Freq: Every day | ORAL | 1 refills | Status: DC

## 2022-02-04 NOTE — Addendum Note (Signed)
Addended by: Austin Miles F on: 02/04/2022 12:46 PM   Modules accepted: Orders

## 2022-02-04 NOTE — Patient Instructions (Signed)
High-Protein and High-Calorie Diet Eating high-protein and high-calorie foods can help you to gain weight, heal after an injury, and recover after an illness or surgery. The specific amount of daily protein and calories you need depends on: Your body weight. The reason this diet is recommended for you. Generally, a high-protein, high-calorie diet involves: Eating 250-500 extra calories each day. Making sure that you get enough of your daily calories from protein. Ask your health care provider how many of your calories should come from protein. Talk with a health care provider or a dietitian about how much protein and how many calories you need each day. Follow the diet as directed by your health care provider. What are tips for following this plan?  Reading food labels Check the nutrition facts label for calories, grams of fat and protein. Items with more than 4 grams of protein are high-protein foods. Preparing meals Add whole milk, half-and-half, or heavy cream to cereal, pudding, soup, or hot cocoa. Add whole milk to instant breakfast drinks. Add peanut butter to oatmeal or smoothies. Add powdered milk to baked goods, smoothies, or milkshakes. Add powdered milk, cream, or butter to mashed potatoes. Add cheese to cooked vegetables. Make whole-milk yogurt parfaits. Top them with granola, fruit, or nuts. Add cottage cheese to fruit. Add avocado, cheese, or both to sandwiches or salads. Add avocado to smoothies. Add meat, poultry, or seafood to rice, pasta, casseroles, salads, and soups. Use mayonnaise when making egg salad, chicken salad, or tuna salad. Use peanut butter as a dip for fruits and vegetables or as a topping for pretzels, celery, or crackers. Add beans to casseroles, dips, and spreads. Add pureed beans to sauces and soups. Replace calorie-free drinks with calorie-containing drinks, such as milk and fruit juice. Replace water with milk or heavy cream when making foods such as  oatmeal, pudding, or cocoa. Add oil or butter to cooked vegetables and grains. Add cream cheese to sandwiches or as a topping on crackers and bread. Make cream-based pastas and soups. General information Ask your health care provider if you should take a nutritional supplement. Try to eat six small meals each day instead of three large meals. A general goal is to eat every 2 to 3 hours. Eat a balanced diet. In each meal, include one food that is high in protein and one food with fat in it. Keep nutritious snacks available, such as nuts, trail mixes, dried fruit, and yogurt. If you have kidney disease or diabetes, talk with your health care provider about how much protein is safe for you. Too much protein may put extra stress on your kidneys. Drink your calories. Choose high-calorie drinks and have them after your meals. Consider setting a timer to remind you to eat. You will want to eat even if you do not feel very hungry. What high-protein foods should I eat?  Vegetables Soybeans. Peas. Grains Quinoa. Bulgur wheat. Buckwheat. Meats and other proteins Beef, pork, and poultry. Fish and seafood. Eggs. Tofu. Textured vegetable protein (TVP). Peanut butter. Nuts and seeds. Dried beans. Protein powders. Hummus. Dairy Whole milk. Whole-milk yogurt. Powdered milk. Cheese. Cottage Cheese. Eggnog. Beverages High-protein supplement drinks. Soy milk. Other foods Protein bars. The items listed above may not be a complete list of foods and beverages you can eat and drink. Contact a dietitian for more information. What high-calorie foods should I eat? Fruits Dried fruit. Fruit leather. Canned fruit in syrup. Fruit juice. Avocado. Vegetables Vegetables cooked in oil or butter. Fried potatoes. Grains   Pasta. Quick breads. Muffins. Pancakes. Ready-to-eat cereal. Meats and other proteins Peanut butter. Nuts and seeds. Dairy Heavy cream. Whipped cream. Cream cheese. Sour cream. Ice cream. Custard.  Pudding. Whole milk dairy products. Beverages Meal-replacement beverages. Nutrition shakes. Fruit juice. Seasonings and condiments Salad dressing. Mayonnaise. Alfredo sauce. Fruit preserves or jelly. Honey. Syrup. Sweets and desserts Cake. Cookies. Pie. Pastries. Candy bars. Chocolate. Fats and oils Butter or margarine. Oil. Gravy. Other foods Meal-replacement bars. The items listed above may not be a complete list of foods and beverages you can eat and drink. Contact a dietitian for more information. Summary A high-protein, high-calorie diet can help you gain weight or heal faster after an injury, illness, or surgery. To increase your protein and calories, add ingredients such as whole milk, peanut butter, cheese, beans, meat, or seafood to meal items. To get enough extra calories each day, include high-calorie foods and beverages at each meal. Adding a high-calorie drink or shake can be an easy way to help you get enough calories each day. Talk with your healthcare provider or dietitian about the best options for you. This information is not intended to replace advice given to you by your health care provider. Make sure you discuss any questions you have with your health care provider. Document Revised: 05/27/2020 Document Reviewed: 05/27/2020 Elsevier Patient Education  2023 Elsevier Inc.  

## 2022-02-04 NOTE — Progress Notes (Signed)
Subjective:    Patient ID: Abigail Moss, female    DOB: October 01, 1945, 76 y.o.   MRN: [id number]  Chief Complaint  Patient presents with   Medical Management of Chronic Issues    Wants to discuss trulance    Fatigue    Wants thyroid checked and iron    Pt presents to the office today for chronic follow up. She is drinking Ensure as needed since COVID she has not been able to get meats.    She has osteoporosis and takes Fosamax.  Last Dexa scan 04/11/20.  Reporting she has increased fatigue.  Hypertension This is a chronic problem. The current episode started more than 1 year ago. The problem has been resolved since onset. Associated symptoms include anxiety and malaise/fatigue. Pertinent negatives include no peripheral edema or shortness of breath. Risk factors for coronary artery disease include dyslipidemia, obesity and sedentary lifestyle. The current treatment provides moderate improvement. Identifiable causes of hypertension include a thyroid problem.  Gastroesophageal Reflux She complains of belching and heartburn. This is a chronic problem. The current episode started more than 1 year ago. The problem occurs occasionally. Associated symptoms include fatigue. Risk factors include obesity. She has tried a PPI for the symptoms. The treatment provided moderate relief.  Thyroid Problem Presents for follow-up visit. Symptoms include constipation, dry skin and fatigue. Patient reports no anxiety or depressed mood. The symptoms have been stable.  Back Pain This is a chronic problem. The current episode started more than 1 year ago. The problem occurs intermittently. The problem has been waxing and waning since onset. The pain is present in the lumbar spine. The quality of the pain is described as aching. The pain is at a severity of 3/10. The pain is mild. Associated symptoms include numbness and weakness. She has tried analgesics and bed rest for the symptoms. The treatment provided moderate  relief.  Anxiety Presents for follow-up visit. Symptoms include excessive worry. Patient reports no depressed mood, irritability, nervous/anxious behavior or shortness of breath. Symptoms occur rarely. The severity of symptoms is mild.    Constipation This is a chronic problem. The current episode started more than 1 year ago. The problem has been waxing and waning since onset. Her stool frequency is 4 to 5 times per week. Associated symptoms include back pain. Risk factors include dietary change. She has tried diet changes and stool softeners for the symptoms. The treatment provided mild relief.      Review of Systems  Constitutional:  Positive for fatigue and malaise/fatigue. Negative for irritability.  Respiratory:  Negative for shortness of breath.   Gastrointestinal:  Positive for constipation and heartburn.  Musculoskeletal:  Positive for back pain.  Neurological:  Positive for weakness and numbness.  Psychiatric/Behavioral:  The patient is not nervous/anxious.   All other systems reviewed and are negative.      Objective:   Physical Exam Vitals reviewed.  Constitutional:      General: She is not in acute distress.    Appearance: She is well-developed.  HENT:     Head: Normocephalic and atraumatic.     Right Ear: Tympanic membrane normal.     Left Ear: Tympanic membrane normal.  Eyes:     Pupils: Pupils are equal, round, and reactive to light.  Neck:     Thyroid: No thyromegaly.  Cardiovascular:     Rate and Rhythm: Normal rate and regular rhythm.     Heart sounds: Normal heart sounds. No murmur heard. Pulmonary:  Effort: Pulmonary effort is normal. No respiratory distress.     Breath sounds: Normal breath sounds. No wheezing.  Abdominal:     General: Bowel sounds are normal. There is no distension.     Palpations: Abdomen is soft.     Tenderness: There is no abdominal tenderness.  Musculoskeletal:        General: No tenderness. Normal range of motion.      Cervical back: Normal range of motion and neck supple.  Skin:    General: Skin is warm and dry.  Neurological:     Mental Status: She is alert and oriented to person, place, and time.     Cranial Nerves: No cranial nerve deficit.     Motor: Weakness present.     Deep Tendon Reflexes: Reflexes are normal and symmetric.  Psychiatric:        Behavior: Behavior normal.        Thought Content: Thought content normal.        Judgment: Judgment normal.       BP 117/79   Pulse 75   Temp 98.2 F (36.8 C) (Temporal)   Ht _0  (1.676 m)   Wt 179 lb (81.2 kg)   SpO2 96%   BMI 28.89 kg/m      Assessment & Plan:  Abigail Moss comes in today with chief complaint of Medical Management of Chronic Issues (Wants to discuss trulance ) and Fatigue (Wants thyroid checked and iron )   Diagnosis and orders addressed:  1. Primary hypertension - CMP14+EGFR - CBC with Differential/Platelet  2. Gastroesophageal reflux disease, unspecified whether esophagitis present - CMP14+EGFR - CBC with Differential/Platelet  3. Hypothyroidism, unspecified type - CMP14+EGFR - CBC with Differential/Platelet - TSH  4. GAD (generalized anxiety disorder) - CMP14+EGFR - CBC with Differential/Platelet  5. Degeneration of lumbar intervertebral disc - CMP14+EGFR - CBC with Differential/Platelet  6. Vitamin D deficiency - CMP14+EGFR - CBC with Differential/Platelet - VITAMIN D 25 Hydroxy (Vit-D Deficiency, Fractures)  7. Overweight (BMI 25.0-29.9) - CMP14+EGFR - CBC with Differential/Platelet  8. Benzodiazepine dependence (HCC) - CMP14+EGFR - CBC with Differential/Platelet  9. Controlled substance agreement signed - CMP14+EGFR - CBC with Differential/Platelet  10. Other fatigue - CMP14+EGFR - CBC with Differential/Platelet - VITAMIN D 25 Hydroxy (Vit-D Deficiency, Fractures) - Vitamin B12 - TSH - Iron, TIBC and Ferritin Panel  11. Slow transit constipation -Start Trulance 3 mg  today Increase fiber  Force fluids - Plecanatide (TRULANCE) 3 MG TABS; Take 3 mg by mouth daily.  Dispense: 90 tablet; Refill: 1   Labs pending Health Maintenance reviewed Diet and exercise encouraged  Follow up plan: 3 months   [name], FNP

## 2022-02-05 LAB — CMP14+EGFR
ALT: 18 IU/L (ref 0–32)
AST: 20 IU/L (ref 0–40)
Albumin/Globulin Ratio: 2.1 (ref 1.2–2.2)
Albumin: 3.9 g/dL (ref 3.8–4.8)
Alkaline Phosphatase: 58 IU/L (ref 44–121)
BUN/Creatinine Ratio: 19 (ref 12–28)
BUN: 18 mg/dL (ref 8–27)
Bilirubin Total: 0.2 mg/dL (ref 0.0–1.2)
CO2: 25 mmol/L (ref 20–29)
Calcium: 8.8 mg/dL (ref 8.7–10.3)
Chloride: 105 mmol/L (ref 96–106)
Creatinine, Ser: 0.94 mg/dL (ref 0.57–1.00)
Globulin, Total: 1.9 g/dL (ref 1.5–4.5)
Glucose: 82 mg/dL (ref 70–99)
Potassium: 4.2 mmol/L (ref 3.5–5.2)
Sodium: 142 mmol/L (ref 134–144)
Total Protein: 5.8 g/dL — ABNORMAL LOW (ref 6.0–8.5)
eGFR: 62 mL/min/{1.73_m2} (ref >59)

## 2022-02-05 LAB — VITAMIN D 25 HYDROXY (VIT D DEFICIENCY, FRACTURES): Vit D, 25-Hydroxy: 62.6 ng/mL (ref 30.0–100.0)

## 2022-02-05 LAB — CBC WITH DIFFERENTIAL/PLATELET
Basophils Absolute: 0.1 10*3/uL (ref 0.0–0.2)
Basos: 1 %
EOS (ABSOLUTE): 0.3 10*3/uL (ref 0.0–0.4)
Eos: 7 %
Hematocrit: 33.6 % — ABNORMAL LOW (ref 34.0–46.6)
Hemoglobin: 11.5 g/dL (ref 11.1–15.9)
Immature Grans (Abs): 0 10*3/uL (ref 0.0–0.1)
Immature Granulocytes: 0 %
Lymphocytes Absolute: 1.5 10*3/uL (ref 0.7–3.1)
Lymphs: 37 %
MCH: 32.3 pg (ref 26.6–33.0)
MCHC: 34.2 g/dL (ref 31.5–35.7)
MCV: 94 fL (ref 79–97)
Monocytes Absolute: 0.5 10*3/uL (ref 0.1–0.9)
Monocytes: 11 %
Neutrophils Absolute: 1.8 10*3/uL (ref 1.4–7.0)
Neutrophils: 44 %
Platelets: 262 10*3/uL (ref 150–450)
RBC: 3.56 x10E6/uL — ABNORMAL LOW (ref 3.77–5.28)
RDW: 12.4 % (ref 11.7–15.4)
WBC: 4.2 10*3/uL (ref 3.4–10.8)

## 2022-02-05 LAB — IRON,TIBC AND FERRITIN PANEL
Ferritin: 51 ng/mL (ref 15–150)
Iron Saturation: 34 % (ref 15–55)
Iron: 88 ug/dL (ref 27–139)
Total Iron Binding Capacity: 261 ug/dL (ref 250–450)
UIBC: 173 ug/dL (ref 118–369)

## 2022-02-05 LAB — VITAMIN B12: Vitamin B-12: 496 pg/mL (ref 232–1245)

## 2022-02-05 LAB — TSH: TSH: 10.8 u[IU]/mL — ABNORMAL HIGH (ref 0.450–4.500)

## 2022-02-06 ENCOUNTER — Other Ambulatory Visit: Payer: Self-pay | Admitting: Family

## 2022-02-06 MED ORDER — LEVOTHYROXINE SODIUM 125 MCG PO TABS: 125.0000 ug | ORAL_TABLET | Freq: Every day | ORAL | 1 refills | Status: DC

## 2022-02-13 LAB — OXYCODONES,MS,WB/SP RFX
Oxycocone: NEGATIVE ng/mL
Oxycodones Confirmation: NEGATIVE
Oxymorphone: NEGATIVE ng/mL

## 2022-02-13 LAB — OPIATES,MS,WB/SP RFX
6-Acetylmorphine: NEGATIVE
Codeine: NEGATIVE ng/mL
Dihydrocodeine: 3.3 ng/mL
Hydrocodone: 23.5 ng/mL
Hydromorphone: NEGATIVE ng/mL
Morphine: NEGATIVE ng/mL
Opiate Confirmation: POSITIVE

## 2022-02-13 LAB — DRUG SCREEN 10 W/CONF, SERUM
Amphetamines, IA: NEGATIVE ng/mL
Barbiturates, IA: NEGATIVE ug/mL
Benzodiazepines, IA: NEGATIVE ng/mL
Cocaine & Metabolite, IA: NEGATIVE ng/mL
Methadone, IA: NEGATIVE ng/mL
Opiates, IA: POSITIVE ng/mL — AB
Oxycodones, IA: NEGATIVE ng/mL
Phencyclidine, IA: NEGATIVE ng/mL
Propoxyphene, IA: NEGATIVE ng/mL
THC(Marijuana) Metabolite, IA: NEGATIVE ng/mL

## 2022-02-18 ENCOUNTER — Other Ambulatory Visit: Payer: Self-pay | Admitting: Family

## 2022-02-18 DIAGNOSIS — K219 Gastro-esophageal reflux disease without esophagitis: Secondary | ICD-10-CM

## 2022-02-24 ENCOUNTER — Ambulatory Visit (INDEPENDENT_AMBULATORY_CARE_PROVIDER_SITE_OTHER): Payer: Medicare Other

## 2022-02-24 VITALS — BP 110/70 | Wt 174.0 lb

## 2022-02-24 DIAGNOSIS — Z0000 Encounter for general adult medical examination without abnormal findings: Secondary | ICD-10-CM | POA: Diagnosis not present

## 2022-02-24 NOTE — Patient Instructions (Signed)
Abigail Moss , Thank you for taking time to come for your Medicare Wellness Visit. I appreciate your ongoing commitment to your health goals. Please review the following plan we discussed and let me know if I can assist you in the future.   Screening recommendations/referrals: Colonoscopy: Done 10/03/2009 - no repeat due to age Mammogram: Done 06/07/2021 - Repeat annually  Bone Density: Done 04/11/2020 - Repeat every 2 years  Recommended yearly ophthalmology/optometry visit for glaucoma screening and checkup Recommended yearly dental visit for hygiene and checkup  Vaccinations: Influenza vaccine: Due in fall Pneumococcal vaccine: Done 01/02/2011 & 11/21/2014 Tdap vaccine: Done 12/12/2019 - Repeat in 10 years  Shingles vaccine: Done 02/04/2022 - get second dose at next visit   Covid-19: Done 07/30/2019 & 08/27/2019  Advanced directives: Advance directive discussed with you today. Even though you declined this today, please call our office should you change your mind, and we can give you the proper paperwork for you to fill out.   Conditions/risks identified: Aim for 30 minutes of exercise or brisk walking, 6-8 glasses of water, and 5 servings of fruits and vegetables each day.   Next appointment: Follow up in one year for your annual wellness visit    Preventive Care 65 Years and Older, Female Preventive care refers to lifestyle choices and visits with your health care provider that can promote health and wellness. What does preventive care include? A yearly physical exam. This is also called an annual well check. Dental exams once or twice a year. Routine eye exams. Ask your health care provider how often you should have your eyes checked. Personal lifestyle choices, including: Daily care of your teeth and gums. Regular physical activity. Eating a healthy diet. Avoiding tobacco and drug use. Limiting alcohol use. Practicing safe sex. Taking low-dose aspirin every day. Taking vitamin and  mineral supplements as recommended by your health care provider. What happens during an annual well check? The services and screenings done by your health care provider during your annual well check will depend on your age, overall health, lifestyle risk factors, and family history of disease. Counseling  Your health care provider may ask you questions about your: Alcohol use. Tobacco use. Drug use. Emotional well-being. Home and relationship well-being. Sexual activity. Eating habits. History of falls. Memory and ability to understand (cognition). Work and work Astronomer. Reproductive health. Screening  You may have the following tests or measurements: Height, weight, and BMI. Blood pressure. Lipid and cholesterol levels. These may be checked every 5 years, or more frequently if you are over 17 years old. Skin check. Lung cancer screening. You may have this screening every year starting at age 27 if you have a 30-pack-year history of smoking and currently smoke or have quit within the past 15 years. Fecal occult blood test (FOBT) of the stool. You may have this test every year starting at age 40. Flexible sigmoidoscopy or colonoscopy. You may have a sigmoidoscopy every 5 years or a colonoscopy every 10 years starting at age 26. Hepatitis C blood test. Hepatitis B blood test. Sexually transmitted disease (STD) testing. Diabetes screening. This is done by checking your blood sugar (glucose) after you have not eaten for a while (fasting). You may have this done every 1-3 years. Bone density scan. This is done to screen for osteoporosis. You may have this done starting at age 3. Mammogram. This may be done every 1-2 years. Talk to your health care provider about how often you should have regular mammograms. Talk  with your health care provider about your test results, treatment options, and if necessary, the need for more tests. Vaccines  Your health care provider may recommend  certain vaccines, such as: Influenza vaccine. This is recommended every year. Tetanus, diphtheria, and acellular pertussis (Tdap, Td) vaccine. You may need a Td booster every 10 years. Zoster vaccine. You may need this after age 37. Pneumococcal 13-valent conjugate (PCV13) vaccine. One dose is recommended after age 27. Pneumococcal polysaccharide (PPSV23) vaccine. One dose is recommended after age 77. Talk to your health care provider about which screenings and vaccines you need and how often you need them. This information is not intended to replace advice given to you by your health care provider. Make sure you discuss any questions you have with your health care provider. Document Released: 07/20/2015 Document Revised: 03/12/2016 Document Reviewed: 04/24/2015 Elsevier Interactive Patient Education  2017 [place] Prevention in the Home Falls can cause injuries. They can happen to people of all ages. There are many things you can do to make your home safe and to help prevent falls. What can I do on the outside of my home? Regularly fix the edges of walkways and driveways and fix any cracks. Remove anything that might make you trip as you walk through a door, such as a raised step or threshold. Trim any bushes or trees on the path to your home. Use bright outdoor lighting. Clear any walking paths of anything that might make someone trip, such as rocks or tools. Regularly check to see if handrails are loose or broken. Make sure that both sides of any steps have handrails. Any raised decks and porches should have guardrails on the edges. Have any leaves, snow, or ice cleared regularly. Use sand or salt on walking paths during winter. Clean up any spills in your garage right away. This includes oil or grease spills. What can I do in the bathroom? Use night lights. Install grab bars by the toilet and in the tub and shower. Do not use towel bars as grab bars. Use non-skid mats or  decals in the tub or shower. If you need to sit down in the shower, use a plastic, non-slip stool. Keep the floor dry. Clean up any water that spills on the floor as soon as it happens. Remove soap buildup in the tub or shower regularly. Attach bath mats securely with double-sided non-slip rug tape. Do not have throw rugs and other things on the floor that can make you trip. What can I do in the bedroom? Use night lights. Make sure that you have a light by your bed that is easy to reach. Do not use any sheets or blankets that are too big for your bed. They should not hang down onto the floor. Have a firm chair that has side arms. You can use this for support while you get dressed. Do not have throw rugs and other things on the floor that can make you trip. What can I do in the kitchen? Clean up any spills right away. Avoid walking on wet floors. Keep items that you use a lot in easy-to-reach places. If you need to reach something above you, use a strong step stool that has a grab bar. Keep electrical cords out of the way. Do not use floor polish or wax that makes floors slippery. If you must use wax, use non-skid floor wax. Do not have throw rugs and other things on the floor that can make you  trip. What can I do with my stairs? Do not leave any items on the stairs. Make sure that there are handrails on both sides of the stairs and use them. Fix handrails that are broken or loose. Make sure that handrails are as long as the stairways. Check any carpeting to make sure that it is firmly attached to the stairs. Fix any carpet that is loose or worn. Avoid having throw rugs at the top or bottom of the stairs. If you do have throw rugs, attach them to the floor with carpet tape. Make sure that you have a light switch at the top of the stairs and the bottom of the stairs. If you do not have them, ask someone to add them for you. What else can I do to help prevent falls? Wear shoes that: Do not  have high heels. Have rubber bottoms. Are comfortable and fit you well. Are closed at the toe. Do not wear sandals. If you use a stepladder: Make sure that it is fully opened. Do not climb a closed stepladder. Make sure that both sides of the stepladder are locked into place. Ask someone to hold it for you, if possible. Clearly mark and make sure that you can see: Any grab bars or handrails. First and last steps. Where the edge of each step is. Use tools that help you move around (mobility aids) if they are needed. These include: Canes. Walkers. Scooters. Crutches. Turn on the lights when you go into a dark area. Replace any light bulbs as soon as they burn out. Set up your furniture so you have a clear path. Avoid moving your furniture around. If any of your floors are uneven, fix them. If there are any pets around you, be aware of where they are. Review your medicines with your doctor. Some medicines can make you feel dizzy. This can increase your chance of falling. Ask your doctor what other things that you can do to help prevent falls. This information is not intended to replace advice given to you by your health care provider. Make sure you discuss any questions you have with your health care provider. Document Released: 04/19/2009 Document Revised: 11/29/2015 Document Reviewed: 07/28/2014 Elsevier Interactive Patient Education  2017 [employer].

## 2022-02-24 NOTE — Progress Notes (Signed)
Subjective:   Abigail Moss is a 76 y.o. female who presents for Medicare Annual (Subsequent) preventive examination.  Virtual Visit via Telephone Note  I connected with  Abigail Moss on 02/24/22 at  9:45 AM EDT by telephone and verified that I am speaking with the correct person using two identifiers.  Location: Patient: Home Provider: [facility] Persons participating in the virtual visit: patient/Nurse Health Advisor   I discussed the limitations, risks, security and privacy concerns of performing an evaluation and management service by telephone and the availability of in person appointments. The patient expressed understanding and agreed to proceed.  Interactive audio and video telecommunications were attempted between this nurse and patient, however failed, due to patient having technical difficulties OR patient did not have access to video capability.  We continued and completed visit with audio only.  Some vital signs may be absent or patient reported.   Carneshia Raker E Rozena Fierro, LPN   Review of Systems     Cardiac Risk Factors include: advanced age (>15men, >52 women);hypertension     Objective:    Today's Vitals   02/24/22 0940  BP: 110/70  Weight: 174 lb (78.9 kg)   Body mass index is 28.08 kg/m.     02/24/2022    9:49 AM 02/21/2021   10:09 AM 04/30/2020   10:52 AM 02/20/2020   10:20 AM 02/10/2019   10:14 AM 11/21/2014   10:25 AM  Advanced Directives  Does Patient Have a Medical Advance Directive? No No No No No No  Would patient like information on creating a medical advance directive? No - Patient declined No - Patient declined  No - Patient declined No - Patient declined Yes - Educational materials given    Current Medications (verified) Outpatient Encounter Medications as of 02/24/2022  Medication Sig   Albuterol Sulfate (PROAIR RESPICLICK) 108 (90 Base) MCG/ACT AEPB Inhale 1-2 puffs into the lungs every 6 (six) hours as needed.   alendronate (FOSAMAX) 70 MG tablet TAKE 1  TABLET BY MOUTH WEEKLY  WITH 8 OZ OF PLAIN WATER 30  MINUTES BEFORE FIRST FOOD, DRINK OR MEDS. STAY UPRIGHT FOR 30  MINS   ALPRAZolam (XANAX) 0.5 MG tablet Take 1 tablet (0.5 mg total) by mouth at bedtime as needed.   aspirin EC 81 MG tablet Take 81 mg by mouth daily.   buPROPion (WELLBUTRIN XL) 150 MG 24 hr tablet TAKE 1 TABLET BY MOUTH DAILY   Calcium Citrate-Vitamin D (CALCIUM CITRATE + PO) Take 600 mg by mouth daily.   Cholecalciferol (VITAMIN D-3 PO) Take 800 mg by mouth daily.   diclofenac (VOLTAREN) 75 MG EC tablet TAKE 1 TABLET BY MOUTH  TWICE DAILY AS NEEDED   fluticasone (FLONASE) 50 MCG/ACT nasal spray SPRAY 2 SPRAYS INTO EACH NOSTRIL EVERY DAY   HYDROcodone-acetaminophen (NORCO) 10-325 MG tablet Take 1 tablet by mouth 3 (three) times daily as needed.   levothyroxine (SYNTHROID) 125 MCG tablet Take 1 tablet (125 mcg total) by mouth daily.   losartan (COZAAR) 100 MG tablet TAKE 1 TABLET BY MOUTH DAILY   montelukast (SINGULAIR) 10 MG tablet TAKE 1 TABLET BY MOUTH DAILY AT  BEDTIME   MULTIPLE VITAMIN PO Take by mouth.   omeprazole (PRILOSEC) 40 MG capsule TAKE 1 CAPSULE BY MOUTH DAILY   Plecanatide (TRULANCE) 3 MG TABS Take 3 mg by mouth daily.   No facility-administered encounter medications on file as of 02/24/2022.    Allergies (verified) Baclofen, Elemental sulfur, Gabapentin, Meloxicam, Neomycin, and Latex  History: Past Medical History:  Diagnosis Date   GERD (gastroesophageal reflux disease)    Hypertension    Osteopenia    Thyroid disease    Past Surgical History:  Procedure Laterality Date   EAR TUBE REMOVAL     ROTATOR CUFF REPAIR Left    TONSILLECTOMY     VEIN LIGATION AND STRIPPING     Family History  Problem Relation Age of Onset   Heart disease Mother 73       CHF   Diabetes Father    Heart disease Sister        CHF   Cancer Sister        throat   Cancer Brother    Stroke Brother    COPD Brother    Breast cancer Neg Hx    Social History    Socioeconomic History   Marital status: Married    Spouse name: [name]   Number of children: 2   Years of education: 12   Highest education level: High school graduate  Occupational History   Occupation: retired     Comment: worked at [employer]  Tobacco Use   Smoking status: Former    Types: Cigarettes    Quit date: 07/07/1996    Years since quitting: 25.6   Smokeless tobacco: Never  Vaping Use   Vaping Use: Never used  Substance and Sexual Activity   Alcohol use: No    Alcohol/week: 0.0 standard drinks of alcohol   Drug use: No   Sexual activity: Not Currently  Other Topics Concern   Not on file  Social History Narrative   Lives at home with husband.  Grandson stays with them a lot.   Children live nearby   Social Determinants of Health   Financial Resource Strain: Low Risk  (02/24/2022)   Overall Financial Resource Strain (CARDIA)    Difficulty of Paying Living Expenses: Not hard at all  Food Insecurity: No Food Insecurity (02/24/2022)   Hunger Vital Sign    Worried About Running Out of Food in the Last Year: Never true    Ran Out of Food in the Last Year: Never true  Transportation Needs: No Transportation Needs (02/24/2022)   PRAPARE - Administrator, Civil Service (Medical): No    Lack of Transportation (Non-Medical): No  Physical Activity: Sufficiently Active (02/24/2022)   Exercise Vital Sign    Days of Exercise per Week: 7 days    Minutes of Exercise per Session: 30 min  Stress: No Stress Concern Present (02/24/2022)   [employer] of Occupational Health - Occupational Stress Questionnaire    Feeling of Stress : Only a little  Social Connections: Moderately Isolated (02/24/2022)   Social Connection and Isolation Panel [NHANES]    Frequency of Communication with Friends and Family: More than three times a week    Frequency of Social Gatherings with Friends and Family: More than three times a week    Attends Religious Services: Never    Loss adjuster, chartered or Organizations: No    Attends Engineer, structural: Never    Marital Status: Married    Tobacco Counseling Counseling given: Not Answered   Clinical Intake:  Pre-visit preparation completed: Yes  Pain : No/denies pain     BMI - recorded: 28.89 Nutritional Status: BMI 25 -29 Overweight Nutritional Risks: None Diabetes: No  How often do you need to have someone help you when you read instructions, pamphlets, or other written materials from your  doctor or pharmacy?: 1 - Never  Diabetic? no  Interpreter Needed?: No  Information entered by :: Geran Haithcock, LPN   Activities of Daily Living    02/24/2022    9:50 AM  In your present state of health, do you have any difficulty performing the following activities:  Hearing? 0  Vision? 0  Difficulty concentrating or making decisions? 0  Walking or climbing stairs? 0  Dressing or bathing? 0  Doing errands, shopping? 0  Preparing Food and eating ? N  Using the Toilet? N  In the past six months, have you accidently leaked urine? N  Do you have problems with loss of bowel control? N  Managing your Medications? N  Managing your Finances? N  Housekeeping or managing your Housekeeping? N    Patient Care Team: Junie Spencer, FNP as PCP - General (Family Medicine) Hart Carwin, MD (Inactive) as Consulting Physician (Gastroenterology) Landry Mellow, AUD (Audiology) Michaelle Copas, MD as Referring Physician (Optometry)  Indicate any recent Medical Services you may have received from other than [facility] providers in the past year (date may be approximate).     Assessment:   This is a routine wellness examination for Leary.  Hearing/Vision screen Hearing Screening - Comments:: Wears hearing aids - from [facility] Vision Screening - Comments:: Wears rx glasses - up to date with routine eye exams with [facility]  Dietary issues and exercise activities discussed: Current Exercise Habits:  Home exercise routine, Type of exercise: walking, Time (Minutes): 30, Frequency (Times/Week): 7, Weekly Exercise (Minutes/Week): 210, Intensity: Mild, Exercise limited by: orthopedic condition(s)   Goals Addressed             This Visit's Progress    Exercise 3x per week (30 min per time)   On track    Walk for at least 30 minutes 3 times per week 02/24/22 - She hopes to get floors replaced in her trailer home       Depression Screen    02/24/2022    9:46 AM 02/04/2022   10:58 AM 12/26/2021    1:31 PM 09/26/2021    1:28 PM 03/25/2021    2:20 PM 02/21/2021   10:08 AM 11/19/2020   11:40 AM  PHQ 2/9 Scores  PHQ - 2 Score 0 0 0 0 0 0 0  PHQ- 9 Score 3 3  2   4     Fall Risk    02/24/2022    9:42 AM 02/04/2022   10:57 AM 12/26/2021    1:31 PM 09/26/2021    1:28 PM 03/25/2021    2:20 PM  Fall Risk   Falls in the past year? 1 1 1  0 0  Number falls in past yr: 0 0 0    Injury with Fall? 0 0 0    Risk for fall due to : Orthopedic patient;History of fall(s)  History of fall(s)    Follow up Falls prevention discussed  Falls evaluation completed      FALL RISK PREVENTION PERTAINING TO THE HOME:  Any stairs in or around the home? Yes  If so, are there any without handrails? No  Home free of loose throw rugs in walkways, pet beds, electrical cords, etc? Yes  Adequate lighting in your home to reduce risk of falls? Yes   ASSISTIVE DEVICES UTILIZED TO PREVENT FALLS:  Life alert? No  Use of a cane, walker or w/c? No  Grab bars in the bathroom? Yes  Shower  chair or bench in shower? No  Elevated toilet seat or a handicapped toilet? No   TIMED UP AND GO:  Was the test performed? No . Telephonic visit  Cognitive Function:    11/21/2014   10:33 AM  MMSE - Mini Mental State Exam  Orientation to time 5  Orientation to Place 5  Registration 3  Attention/ Calculation 5  Recall 3  Language- name 2 objects 2  Language- repeat 1  Language- follow 3 step command 3  Language- read &  follow direction 1  Write a sentence 1  Copy design 1  Total score 30        02/24/2022    9:51 AM 02/20/2020   10:22 AM 02/10/2019   10:18 AM  6CIT Screen  What Year? 0 points 0 points 0 points  What month? 0 points 0 points 0 points  What time? 0 points 0 points 0 points  Count back from 20 0 points 0 points 0 points  Months in reverse 0 points 0 points 0 points  Repeat phrase 0 points 0 points 0 points  Total Score 0 points 0 points 0 points    Immunizations Immunization History  Administered Date(s) Administered   Fluad Quad(high Dose 65+) 03/28/2020   Influenza, High Dose Seasonal PF 05/22/2018, 04/03/2019, 04/03/2019   Influenza,inj,Quad PF,6+ Mos 04/07/2013   Influenza-Unspecified 03/20/2016, 04/30/2017   Moderna Sars-Covid-2 Vaccination 07/30/2019, 08/27/2019   Pneumococcal Conjugate-13 11/21/2014   Pneumococcal Polysaccharide-23 01/02/2011   Td 04/06/2006   Tdap 12/12/2019   Zoster Recombinat (Shingrix) 02/04/2022    TDAP status: Up to date  Flu Vaccine status: Due, Education has been provided regarding the importance of this vaccine. Advised may receive this vaccine at local pharmacy or Health Dept. Aware to provide a copy of the vaccination record if obtained from local pharmacy or Health Dept. Verbalized acceptance and understanding.  Pneumococcal vaccine status: Up to date  Covid-19 vaccine status: Completed vaccines  Qualifies for Shingles Vaccine? Yes   Zostavax completed No   Shingrix Completed?: No.    Education has been provided regarding the importance of this vaccine. Patient has been advised to call insurance company to determine out of pocket expense if they have not yet received this vaccine. Advised may also receive vaccine at local pharmacy or Health Dept. Verbalized acceptance and understanding.  Screening Tests Health Maintenance  Topic Date Due   COVID-19 Vaccine (3 - Moderna risk series) 09/24/2019   INFLUENZA VACCINE  10/05/2022  (Originally 02/04/2022)   Zoster Vaccines- Shingrix (2 of 2) 04/01/2022   DEXA SCAN  04/11/2022   TETANUS/TDAP  12/11/2029   Pneumonia Vaccine 28+ Years old  Completed   Hepatitis C Screening  Completed   HPV VACCINES  Aged Out   COLONOSCOPY (Pts 45-14yrs Insurance coverage will need to be confirmed)  Discontinued    Health Maintenance  Health Maintenance Due  Topic Date Due   COVID-19 Vaccine (3 - Moderna risk series) 09/24/2019    Colorectal cancer screening: No longer required.   Mammogram status: Completed 06/07/2021. Repeat every year  Bone Density status: Completed 04/11/2020. Results reflect: Bone density results: OSTEOPENIA. Repeat every 2 years.  Lung Cancer Screening: (Low Dose CT Chest recommended if Age 77-80 years, 30 pack-year currently smoking OR have quit w/in 15years.) does not qualify.   Additional Screening:  Hepatitis C Screening: does qualify; Completed 12/18/2015  Vision Screening: Recommended annual ophthalmology exams for early detection of glaucoma and other disorders of the eye. Is the  patient up to date with their annual eye exam?  Yes  Who is the provider or what is the name of the office in which the patient attends annual eye exams? [facility] If pt is not established with a provider, would they like to be referred to a provider to establish care? No .   Dental Screening: Recommended annual dental exams for proper oral hygiene  Community Resource Referral / Chronic Care Management: CRR required this visit?  No   CCM required this visit?  No      Plan:     I have personally reviewed and noted the following in the patient's chart:   Medical and social history Use of alcohol, tobacco or illicit drugs  Current medications and supplements including opioid prescriptions. Patient is currently taking opioid prescriptions. Information provided to patient regarding non-opioid alternatives. Patient advised to discuss non-opioid treatment  plan with their provider. Functional ability and status Nutritional status Physical activity Advanced directives List of other physicians Hospitalizations, surgeries, and ER visits in previous 12 months Vitals Screenings to include cognitive, depression, and falls Referrals and appointments  In addition, I have reviewed and discussed with patient certain preventive protocols, quality metrics, and best practice recommendations. A written personalized care plan for preventive services as well as general preventive health recommendations were provided to patient.     [place] Constablemy E Tyrhonda Georgiades, LPN   1/61/09608/21/2023   Nurse Notes: [facility] [place] [facility] wouldn't [facility] it.

## 2022-03-13 ENCOUNTER — Other Ambulatory Visit: Payer: Self-pay | Admitting: Family

## 2022-03-13 ENCOUNTER — Encounter: Payer: Self-pay | Admitting: Family

## 2022-03-13 ENCOUNTER — Ambulatory Visit (INDEPENDENT_AMBULATORY_CARE_PROVIDER_SITE_OTHER): Payer: Medicare Other | Admitting: Family

## 2022-03-13 DIAGNOSIS — R197 Diarrhea, unspecified: Secondary | ICD-10-CM | POA: Diagnosis not present

## 2022-03-13 DIAGNOSIS — I10 Essential (primary) hypertension: Secondary | ICD-10-CM

## 2022-03-13 DIAGNOSIS — K649 Unspecified hemorrhoids: Secondary | ICD-10-CM

## 2022-03-13 MED ORDER — HYDROCORTISONE ACETATE 25 MG RE SUPP: 25.0000 mg | Freq: Two times a day (BID) | RECTAL | 0 refills | Status: DC

## 2022-03-13 NOTE — Progress Notes (Signed)
Virtual Visit  Note Due to COVID-19 pandemic this visit was conducted virtually. This visit type was conducted due to national recommendations for restrictions regarding the COVID-19 Pandemic (e.g. social distancing, sheltering in place) in an effort to limit this patient's exposure and mitigate transmission in our community. All issues noted in this document were discussed and addressed.  A physical exam was not performed with this format.  I connected with Abigail Moss on 03/13/22 at 2:00 pm  by telephone and verified that I am speaking with the correct person using two identifiers. Abigail Moss is currently located at home and no one is currently with her during visit. The provider, Jannifer Rodney, FNP is located in their office at time of visit.  I discussed the limitations, risks, security and privacy concerns of performing an evaluation and management service by telephone and the availability of in person appointments. I also discussed with the patient that there may be a patient responsible charge related to this service. The patient expressed understanding and agreed to proceed.  Ms. shane, melby are scheduled for a virtual visit with your provider today.    Just as we do with appointments in the office, we must obtain your consent to participate.  Your consent will be active for this visit and any virtual visit you may have with one of our providers in the next 365 days.    If you have a MyChart account, I can also send a copy of this consent to you electronically.  All virtual visits are billed to your insurance company just like a traditional visit in the office.  As this is a virtual visit, video technology does not allow for your provider to perform a traditional examination.  This may limit your provider's ability to fully assess your condition.  If your provider identifies any concerns that need to be evaluated in person or the need to arrange testing such as labs, EKG, etc, we will make  arrangements to do so.    Although advances in technology are sophisticated, we cannot ensure that it will always work on either your end or our end.  If the connection with a video visit is poor, we may have to switch to a telephone visit.  With either a video or telephone visit, we are not always able to ensure that we have a secure connection.   I need to obtain your verbal consent now.   Are you willing to proceed with your visit today?   Abigail Moss has provided verbal consent on 03/13/2022 for a virtual visit (video or telephone).   Jannifer Rodney, [place] 03/13/2022  2:06 PM    History and Present Illness:   PT presents to the office today with diarrhea. States she took several Imodium and then became constipated. She then took one laxative and has had diarrhea. States her hemorrhoids are flared up.  Diarrhea  This is a new problem. The current episode started 1 to 4 weeks ago. The problem occurs 5 to 10 times per day. The stool consistency is described as Watery. Pertinent negatives include no bloating, chills or coughing.     Review of Systems  Constitutional:  Negative for chills.  Respiratory:  Negative for cough.   Gastrointestinal:  Positive for diarrhea. Negative for bloating.  All other systems reviewed and are negative.    Observations/Objective: No SOB or distress noted   Assessment and Plan: 1. Diarrhea, unspecified type Start daily fiber Imodium as needed Increase K+ in  diet Stay hydrated    2. Hemorrhoids, unspecified hemorrhoid type Avoid straining - hydrocortisone (ANUSOL-HC) 25 MG suppository; Place 1 suppository (25 mg total) rectally 2 (two) times daily.  Dispense: 12 suppository; Refill: 0    I discussed the assessment and treatment plan with the patient. The patient was provided an opportunity to ask questions and all were answered. The patient agreed with the plan and demonstrated an understanding of the instructions.   The patient was advised to  call back or seek an in-person evaluation if the symptoms worsen or if the condition fails to improve as anticipated.  The above assessment and management plan was discussed with the patient. The patient verbalized understanding of and has agreed to the management plan. Patient is aware to call the clinic if symptoms persist or worsen. Patient is aware when to return to the clinic for a follow-up visit. Patient educated on when it is appropriate to go to the emergency department.   Time call ended:  2:13 pm   I provided 13 minutes of  non face-to-face time during this encounter.    Jannifer Rodney, FNP

## 2022-03-13 NOTE — Patient Instructions (Signed)
Diarrhea, Adult Diarrhea is frequent loose and watery bowel movements. Diarrhea can make you feel weak and cause you to become dehydrated. Dehydration can make you tired and thirsty, cause you to have a dry mouth, and decrease how often you urinate. Diarrhea typically lasts 2-3 days. However, it can last longer if it is a sign of something more serious. It is important to treat your diarrhea as told by your health care provider. Follow these instructions at home: Eating and drinking     Follow these recommendations as told by your health care provider: Take an oral rehydration solution (ORS). This is an over-the-counter medicine that helps return your body to its normal balance of nutrients and water. It is found at pharmacies and retail stores. Drink plenty of fluids, such as water, ice chips, diluted fruit juice, and low-calorie sports drinks. You can drink milk also, if desired. Avoid drinking fluids that contain a lot of sugar or caffeine, such as energy drinks, sports drinks, and soda. Eat bland, easy-to-digest foods in small amounts as you are able. These foods include bananas, applesauce, rice, lean meats, toast, and crackers. Avoid alcohol. Avoid spicy or fatty foods.  Medicines Take over-the-counter and prescription medicines only as told by your health care provider. If you were prescribed an antibiotic medicine, take it as told by your health care provider. Do not stop using the antibiotic even if you start to feel better. General instructions  Wash your hands often using soap and water. If soap and water are not available, use a hand sanitizer. Others in the household should wash their hands as well. Hands should be washed: After using the toilet or changing a diaper. Before preparing, cooking, or serving food. While caring for a sick person or while visiting someone in a hospital. Drink enough fluid to keep your urine pale yellow. Rest at home while you recover. Watch your  condition for any changes. Take a warm bath to relieve any burning or pain from frequent diarrhea episodes. Keep all follow-up visits as told by your health care provider. This is important. Contact a health care provider if: You have a fever. Your diarrhea gets worse. You have new symptoms. You cannot keep fluids down. You feel light-headed or dizzy. You have a headache. You have muscle cramps. Get help right away if: You have chest pain. You feel extremely weak or you faint. You have bloody or black stools or stools that look like tar. You have severe pain, cramping, or bloating in your abdomen. You have trouble breathing or you are breathing very quickly. Your heart is beating very quickly. Your skin feels cold and clammy. You feel confused. You have signs of dehydration, such as: Dark urine, very little urine, or no urine. Cracked lips. Dry mouth. Sunken eyes. Sleepiness. Weakness. Summary Diarrhea is frequent loose and sometimes watery bowel movements. Diarrhea can make you feel weak and cause you to become dehydrated. Drink enough fluids to keep your urine pale yellow. Make sure that you wash your hands after using the toilet. If soap and water are not available, use hand sanitizer. Contact a health care provider if your diarrhea gets worse or you have new symptoms. Get help right away if you have signs of dehydration. This information is not intended to replace advice given to you by your health care provider. Make sure you discuss any questions you have with your health care provider. Document Revised: 09/13/2021 Document Reviewed: 01/02/2021 Elsevier Patient Education  2023 [employer]  

## 2022-03-17 ENCOUNTER — Ambulatory Visit (INDEPENDENT_AMBULATORY_CARE_PROVIDER_SITE_OTHER): Payer: Medicare Other | Admitting: Family

## 2022-03-17 ENCOUNTER — Encounter: Payer: Self-pay | Admitting: Family

## 2022-03-17 DIAGNOSIS — R531 Weakness: Secondary | ICD-10-CM | POA: Diagnosis not present

## 2022-03-17 DIAGNOSIS — K625 Hemorrhage of anus and rectum: Secondary | ICD-10-CM

## 2022-03-17 DIAGNOSIS — R197 Diarrhea, unspecified: Secondary | ICD-10-CM

## 2022-03-17 DIAGNOSIS — K649 Unspecified hemorrhoids: Secondary | ICD-10-CM

## 2022-03-17 NOTE — Progress Notes (Signed)
Virtual Visit  Note Due to COVID-19 pandemic this visit was conducted virtually. This visit type was conducted due to national recommendations for restrictions regarding the COVID-19 Pandemic (e.g. social distancing, sheltering in place) in an effort to limit this patient's exposure and mitigate transmission in our community. All issues noted in this document were discussed and addressed.  A physical exam was not performed with this format.  I connected with Abigail Moss on 03/17/22 at 3:28 pm by telephone and verified that I am speaking with the correct person using two identifiers. Abigail Moss is currently located at home and no one is currently with her during visit. The provider, [name], FNP is located in their office at time of visit.  I discussed the limitations, risks, security and privacy concerns of performing an evaluation and management service by telephone and the availability of in person appointments. I also discussed with the patient that there may be a patient responsible charge related to this service. The patient expressed understanding and agreed to proceed.  Ms. lysbeth, dicola are scheduled for a virtual visit with your provider today.    Just as we do with appointments in the office, we must obtain your consent to participate.  Your consent will be active for this visit and any virtual visit you may have with one of our providers in the next 365 days.    If you have a MyChart account, I can also send a copy of this consent to you electronically.  All virtual visits are billed to your insurance company just like a traditional visit in the office.  As this is a virtual visit, video technology does not allow for your provider to perform a traditional examination.  This may limit your provider's ability to fully assess your condition.  If your provider identifies any concerns that need to be evaluated in person or the need to arrange testing such as labs, EKG, etc, we will make  arrangements to do so.    Although advances in technology are sophisticated, we cannot ensure that it will always work on either your end or our end.  If the connection with a video visit is poor, we may have to switch to a telephone visit.  With either a video or telephone visit, we are not always able to ensure that we have a secure connection.   I need to obtain your verbal consent now.   Are you willing to proceed with your visit today?   Abigail Moss has provided verbal consent on 03/17/2022 for a virtual visit (video or telephone).   [name], [place] 03/17/2022  3:36 PM     History and Present Illness:  HPI PT calls the office today with weakness. Reports she was having diarrhea and noticed mild bleeding when she wipes. Reports she does have hemorrhoids. Reports the diarrhea has improved and the bleeding is improved.    Review of Systems  Constitutional:  Positive for malaise/fatigue.  All other systems reviewed and are negative.    Observations/Objective: No SOB or distress noted   Assessment and Plan: 1. Weakness - CBC with Differential/Platelet; Future - CMP14+EGFR; Future  2. Diarrhea, unspecified type - CBC with Differential/Platelet; Future - CMP14+EGFR; Future  3. Hemorrhoids, unspecified hemorrhoid type - CBC with Differential/Platelet; Future - CMP14+EGFR; Future  4. Rectal bleeding - CBC with Differential/Platelet; Future - CMP14+EGFR; Future  Force fluids  Bland diet Labs pending  Follow up if symptoms worsen or do not improve  I discussed the assessment and treatment plan with the patient. The patient was provided an opportunity to ask questions and all were answered. The patient agreed with the plan and demonstrated an understanding of the instructions.   The patient was advised to call back or seek an in-person evaluation if the symptoms worsen or if the condition fails to improve as anticipated.  The above assessment and management plan  was discussed with the patient. The patient verbalized understanding of and has agreed to the management plan. Patient is aware to call the clinic if symptoms persist or worsen. Patient is aware when to return to the clinic for a follow-up visit. Patient educated on when it is appropriate to go to the emergency department.   Time call ended:  3:40 pm   I provided 12 minutes of  non face-to-face time during this encounter.    [name], FNP

## 2022-03-18 ENCOUNTER — Other Ambulatory Visit: Payer: Medicare Other

## 2022-03-18 DIAGNOSIS — R531 Weakness: Secondary | ICD-10-CM

## 2022-03-18 DIAGNOSIS — R197 Diarrhea, unspecified: Secondary | ICD-10-CM

## 2022-03-18 DIAGNOSIS — K625 Hemorrhage of anus and rectum: Secondary | ICD-10-CM | POA: Diagnosis not present

## 2022-03-18 DIAGNOSIS — K649 Unspecified hemorrhoids: Secondary | ICD-10-CM

## 2022-03-19 LAB — CBC WITH DIFFERENTIAL/PLATELET
Basophils Absolute: 0.1 10*3/uL (ref 0.0–0.2)
Basos: 1 %
EOS (ABSOLUTE): 0.3 10*3/uL (ref 0.0–0.4)
Eos: 5 %
Hematocrit: 33.1 % — ABNORMAL LOW (ref 34.0–46.6)
Hemoglobin: 11.2 g/dL (ref 11.1–15.9)
Immature Grans (Abs): 0 10*3/uL (ref 0.0–0.1)
Immature Granulocytes: 0 %
Lymphocytes Absolute: 1.5 10*3/uL (ref 0.7–3.1)
Lymphs: 28 %
MCH: 32.1 pg (ref 26.6–33.0)
MCHC: 33.8 g/dL (ref 31.5–35.7)
MCV: 95 fL (ref 79–97)
Monocytes Absolute: 0.4 10*3/uL (ref 0.1–0.9)
Monocytes: 8 %
Neutrophils Absolute: 3.1 10*3/uL (ref 1.4–7.0)
Neutrophils: 58 %
Platelets: 358 10*3/uL (ref 150–450)
RBC: 3.49 x10E6/uL — ABNORMAL LOW (ref 3.77–5.28)
RDW: 12.4 % (ref 11.7–15.4)
WBC: 5.4 10*3/uL (ref 3.4–10.8)

## 2022-03-19 LAB — CMP14+EGFR
ALT: 15 IU/L (ref 0–32)
AST: 19 IU/L (ref 0–40)
Albumin/Globulin Ratio: 1.5 (ref 1.2–2.2)
Albumin: 4 g/dL (ref 3.8–4.8)
Alkaline Phosphatase: 58 IU/L (ref 44–121)
BUN/Creatinine Ratio: 19 (ref 12–28)
BUN: 17 mg/dL (ref 8–27)
Bilirubin Total: <0.2 mg/dL (ref 0.0–1.2)
CO2: 26 mmol/L (ref 20–29)
Calcium: 9.1 mg/dL (ref 8.7–10.3)
Chloride: 106 mmol/L (ref 96–106)
Creatinine, Ser: 0.91 mg/dL (ref 0.57–1.00)
Globulin, Total: 2.7 g/dL (ref 1.5–4.5)
Glucose: 79 mg/dL (ref 70–99)
Potassium: 4.7 mmol/L (ref 3.5–5.2)
Sodium: 145 mmol/L — ABNORMAL HIGH (ref 134–144)
Total Protein: 6.7 g/dL (ref 6.0–8.5)
eGFR: 65 mL/min/{1.73_m2} (ref >59)

## 2022-03-21 ENCOUNTER — Other Ambulatory Visit: Payer: Self-pay | Admitting: Family

## 2022-03-31 ENCOUNTER — Ambulatory Visit (INDEPENDENT_AMBULATORY_CARE_PROVIDER_SITE_OTHER): Payer: Medicare Other | Admitting: Nurse Practitioner

## 2022-03-31 ENCOUNTER — Encounter: Payer: Self-pay | Admitting: Nurse Practitioner

## 2022-03-31 ENCOUNTER — Other Ambulatory Visit: Payer: Self-pay | Admitting: Family

## 2022-03-31 DIAGNOSIS — J0110 Acute frontal sinusitis, unspecified: Secondary | ICD-10-CM

## 2022-03-31 DIAGNOSIS — M810 Age-related osteoporosis without current pathological fracture: Secondary | ICD-10-CM

## 2022-03-31 MED ORDER — PREDNISONE 10 MG PO TABS: 10.0000 mg | ORAL_TABLET | Freq: Every day | ORAL | 0 refills | Status: DC

## 2022-03-31 MED ORDER — AMOXICILLIN-POT CLAVULANATE 875-125 MG PO TABS: 1.0000 | ORAL_TABLET | Freq: Two times a day (BID) | ORAL | 0 refills | Status: DC

## 2022-03-31 NOTE — Progress Notes (Signed)
   Virtual Visit  Note Due to COVID-19 pandemic this visit was conducted virtually. This visit type was conducted due to national recommendations for restrictions regarding the COVID-19 Pandemic (e.g. social distancing, sheltering in place) in an effort to limit this patient's exposure and mitigate transmission in our community. All issues noted in this document were discussed and addressed.  A physical exam was not performed with this format.  I connected with Abigail Moss on 03/31/22 at 10:00 am  by telephone and verified that I am speaking with the correct person using two identifiers. Abigail Moss is currently located at home during visit. The provider, Ivy Lynn, NP is located in their office at time of visit.  I discussed the limitations, risks, security and privacy concerns of performing an evaluation and management service by telephone and the availability of in person appointments. I also discussed with the patient that there may be a patient responsible charge related to this service. The patient expressed understanding and agreed to proceed.   History and Present Illness:  Sinusitis This is a new problem. Episode onset: In the past 3 to 4 days. The problem has been gradually worsening since onset. There has been no fever. Associated symptoms include headaches and sinus pressure. Pertinent negatives include no chills, congestion, coughing, shortness of breath, sore throat or swollen glands. Past treatments include nothing.      Review of Systems  Constitutional:  Positive for malaise/fatigue. Negative for chills and fever.  HENT:  Positive for sinus pressure and sinus pain. Negative for congestion and sore throat.   Eyes: Negative.   Respiratory: Negative.  Negative for cough, shortness of breath and wheezing.   Cardiovascular: Negative.   Skin: Negative.  Negative for rash.  Neurological:  Positive for headaches.  All other systems reviewed and are  negative.    Observations/Objective: Televisit patient not in distress  Assessment and Plan: Patient presents with symptoms of sinusitis in the past 3 days. She denies fever and chills. Advised patient to: Take meds as prescribed - Use a cool mist humidifier  -Use saline nose sprays frequently -Patient is unable to get a clinic today will put and COVID-19 swab. (Lab) -Force fluids -For fever or aches or pains- take Tylenol or ibuprofen. -If symptoms do not improve, she may need to be COVID tested to rule this out   Follow Up Instructions: Follow-up for worsening or unresolved symptoms.    I discussed the assessment and treatment plan with the patient. The patient was provided an opportunity to ask questions and all were answered. The patient agreed with the plan and demonstrated an understanding of the instructions.   The patient was advised to call back or seek an in-person evaluation if the symptoms worsen or if the condition fails to improve as anticipated.  The above assessment and management plan was discussed with the patient. The patient verbalized understanding of and has agreed to the management plan. Patient is aware to call the clinic if symptoms persist or worsen. Patient is aware when to return to the clinic for a follow-up visit. Patient educated on when it is appropriate to go to the emergency department.   Time call ended: 10:13 AM  I provided 13  minutes of  non face-to-face time during this encounter.    Ivy Lynn, NP

## 2022-03-31 NOTE — Patient Instructions (Signed)

## 2022-04-02 DIAGNOSIS — J40 Bronchitis, not specified as acute or chronic: Secondary | ICD-10-CM | POA: Diagnosis not present

## 2022-04-02 DIAGNOSIS — J4521 Mild intermittent asthma with (acute) exacerbation: Secondary | ICD-10-CM | POA: Diagnosis not present

## 2022-04-03 ENCOUNTER — Encounter: Payer: Self-pay | Admitting: Family Medicine

## 2022-04-03 ENCOUNTER — Ambulatory Visit (INDEPENDENT_AMBULATORY_CARE_PROVIDER_SITE_OTHER): Payer: Medicare Other | Admitting: Family Medicine

## 2022-04-03 VITALS — BP 143/94 | HR 72 | Temp 98.0°F | Ht 66.0 in | Wt 171.0 lb

## 2022-04-03 DIAGNOSIS — J4521 Mild intermittent asthma with (acute) exacerbation: Secondary | ICD-10-CM

## 2022-04-03 DIAGNOSIS — J40 Bronchitis, not specified as acute or chronic: Secondary | ICD-10-CM

## 2022-04-03 NOTE — Patient Instructions (Signed)

## 2022-04-03 NOTE — Progress Notes (Signed)
Acute Office Visit  Subjective:     Patient ID: Abigail Moss, female    DOB: 1945/08/15, 76 y.o.   MRN: [id number]  Chief Complaint  Patient presents with   Cough   Nasal Congestion    Cough Episode onset: 4 days. The problem has been gradually improving. Episode frequency: comes in fits. The cough is Productive of sputum. Associated symptoms include myalgias, nasal congestion, a sore throat (resolved), shortness of breath (with coughing or increased activity) and sweats. Pertinent negatives include no chest pain, chills, ear congestion, ear pain, fever, headaches or wheezing. The symptoms are aggravated by fumes (paint fumes from painting bathroom). She has tried oral steroids and a beta-agonist inhaler (nebulizer at [facility] helped) for the symptoms. Her past medical history is significant for asthma, bronchitis and environmental allergies. There is no history of COPD.   She has had a negative Covid test.   Review of Systems  Constitutional:  Negative for chills and fever.  HENT:  Positive for sore throat (resolved). Negative for ear pain.   Respiratory:  Positive for cough and shortness of breath (with coughing or increased activity). Negative for wheezing.   Cardiovascular:  Negative for chest pain.  Musculoskeletal:  Positive for myalgias.  Neurological:  Negative for headaches.  Endo/Heme/Allergies:  Positive for environmental allergies.        Objective:    BP (!) 151/96   Pulse 72   Temp 98 F (36.7 C)   Ht 5\' 6"  (1.676 m)   Wt 171 lb (77.6 kg)   SpO2 95%   BMI 27.60 kg/m    Physical Exam Vitals and nursing note reviewed.  Constitutional:      General: She is not in acute distress.    Appearance: She is not ill-appearing, toxic-appearing or diaphoretic.  HENT:     Right Ear: Tympanic membrane, ear canal and external ear normal. A PE tube is present.     Left Ear: Tympanic membrane, ear canal and external ear normal. A PE tube is present.     Nose: Congestion  present.     Right Sinus: No maxillary sinus tenderness or frontal sinus tenderness.     Left Sinus: No maxillary sinus tenderness or frontal sinus tenderness.     Mouth/Throat:     Mouth: Mucous membranes are moist.     Pharynx: Posterior oropharyngeal erythema present.     Tonsils: No tonsillar exudate or tonsillar abscesses. 1+ on the right. 1+ on the left.  Eyes:     General:        Right eye: No discharge.        Left eye: No discharge.  Cardiovascular:     Rate and Rhythm: Normal rate and regular rhythm.     Heart sounds: Normal heart sounds. No murmur heard. Pulmonary:     Effort: Pulmonary effort is normal. No respiratory distress.     Breath sounds: Normal breath sounds. No wheezing, rhonchi or rales.  Chest:     Chest wall: No tenderness.  Musculoskeletal:     Right lower leg: No edema.     Left lower leg: No edema.  Skin:    General: Skin is warm and dry.  Neurological:     General: No focal deficit present.     Mental Status: She is alert and oriented to person, place, and time.  Psychiatric:        Mood and Affect: Mood normal.        Behavior: Behavior  normal.     No results found for any visits on 04/03/22.      Assessment & Plan:   Abigail Moss was seen today for cough and nasal congestion.  Diagnoses and all orders for this visit:  Bronchitis Mild intermittent asthma with (acute) exacerbation Improving symptoms. Lungs clear on exam, no acute distress. She is currently on prednisone. Sample given of trelegy inhaler to use daily until symptoms improve. Albuterol prn. Continue mucinex. Return to office for new or worsening symptoms, or if symptoms persist.   The patient indicates understanding of these issues and agrees with the plan.  Gabriel Earing, FNP

## 2022-04-07 ENCOUNTER — Other Ambulatory Visit: Payer: Medicare Other

## 2022-04-08 DIAGNOSIS — M79676 Pain in unspecified toe(s): Secondary | ICD-10-CM | POA: Diagnosis not present

## 2022-04-08 DIAGNOSIS — B351 Tinea unguium: Secondary | ICD-10-CM | POA: Diagnosis not present

## 2022-04-14 ENCOUNTER — Other Ambulatory Visit: Payer: Medicare Other

## 2022-04-14 ENCOUNTER — Other Ambulatory Visit: Payer: Self-pay | Admitting: Family Medicine

## 2022-04-14 DIAGNOSIS — E039 Hypothyroidism, unspecified: Secondary | ICD-10-CM | POA: Diagnosis not present

## 2022-04-15 LAB — TSH: TSH: 1.53 u[IU]/mL (ref 0.450–4.500)

## 2022-04-18 ENCOUNTER — Other Ambulatory Visit: Payer: Self-pay | Admitting: Family

## 2022-04-18 DIAGNOSIS — J301 Allergic rhinitis due to pollen: Secondary | ICD-10-CM

## 2022-04-22 ENCOUNTER — Encounter: Payer: Self-pay | Admitting: Family Medicine

## 2022-04-22 ENCOUNTER — Ambulatory Visit (INDEPENDENT_AMBULATORY_CARE_PROVIDER_SITE_OTHER): Payer: Medicare Other | Admitting: Family Medicine

## 2022-04-22 DIAGNOSIS — J0100 Acute maxillary sinusitis, unspecified: Secondary | ICD-10-CM | POA: Diagnosis not present

## 2022-04-22 MED ORDER — AMOXICILLIN-POT CLAVULANATE 875-125 MG PO TABS: 1.0000 | ORAL_TABLET | Freq: Two times a day (BID) | ORAL | 0 refills | Status: DC

## 2022-04-22 MED ORDER — PREDNISONE 10 MG PO TABS: ORAL_TABLET | ORAL | 0 refills | Status: DC

## 2022-04-22 NOTE — Progress Notes (Signed)
Subjective:    Patient ID: Abigail Moss, female    DOB: 10-Feb-1946, 76 y.o.   MRN: [id number]   HPI: Abigail Moss is a 76 y.o. female presenting for sinus sx. HA at top of head. Gums are sore. Earache. LGF X 2 days. Rhinorrhea is yellow. Some cough. Yellow sputum. Posterior nasal drainage      04/03/2022    1:16 PM 02/24/2022    9:46 AM 02/04/2022   10:58 AM 12/26/2021    1:31 PM 09/26/2021    1:28 PM  Depression screen PHQ 2/9  Decreased Interest 0 0 0 0 0  Down, Depressed, Hopeless 0 0 0 0 0  PHQ - 2 Score 0 0 0 0 0  Altered sleeping  0 0  0  Tired, decreased energy  2 2  1   Change in appetite  1 1  1   Feeling bad or failure about yourself   0 0  0  Trouble concentrating  0 0  0  Moving slowly or fidgety/restless  0 0  0  Suicidal thoughts  0 0  0  PHQ-9 Score  3 3  2   Difficult doing work/chores  Not difficult at all Not difficult at all  Not difficult at all     Relevant past medical, surgical, family and social history reviewed and updated as indicated.  Interim medical history since our last visit reviewed. Allergies and medications reviewed and updated.  ROS:  Review of Systems  Constitutional:  Negative for activity change, appetite change, chills and fever.  HENT:  Positive for congestion, postnasal drip, rhinorrhea and sinus pressure. Negative for ear discharge, ear pain, hearing loss, nosebleeds, sneezing and trouble swallowing.   Respiratory:  Negative for chest tightness and shortness of breath.   Cardiovascular:  Negative for chest pain and palpitations.  Skin:  Negative for rash.     Social History   Tobacco Use  Smoking Status Former   Types: Cigarettes   Quit date: 07/07/1996   Years since quitting: 25.8  Smokeless Tobacco Never       Objective:     Wt Readings from Last 3 Encounters:  04/03/22 171 lb (77.6 kg)  02/24/22 174 lb (78.9 kg)  02/04/22 179 lb (81.2 kg)     Exam deferred. Pt. Harboring due to COVID 19. Phone visit performed.    Assessment & Plan:   1. Acute maxillary sinusitis, recurrence not specified     Meds ordered this encounter  Medications   amoxicillin-clavulanate (AUGMENTIN) 875-125 MG tablet    Sig: Take 1 tablet by mouth 2 (two) times daily. Take all of this medication    Dispense:  20 tablet    Refill:  0   predniSONE (DELTASONE) 10 MG tablet    Sig: Take 5 daily for 2 days followed by 4,3,2 and 1 for 2 days each.    Dispense:  30 tablet    Refill:  0    No orders of the defined types were placed in this encounter.     Diagnoses and all orders for this visit:  Acute maxillary sinusitis, recurrence not specified  Other orders -     amoxicillin-clavulanate (AUGMENTIN) 875-125 MG tablet; Take 1 tablet by mouth 2 (two) times daily. Take all of this medication -     predniSONE (DELTASONE) 10 MG tablet; Take 5 daily for 2 days followed by 4,3,2 and 1 for 2 days each.    Virtual Visit via telephone Note  I  discussed the limitations, risks, security and privacy concerns of performing an evaluation and management service by telephone and the availability of in person appointments. The patient was identified with two identifiers. Pt.expressed understanding and agreed to proceed. Pt. Is at home. Dr. Livia Snellen is in his office.  Follow Up Instructions:   I discussed the assessment and treatment plan with the patient. The patient was provided an opportunity to ask questions and all were answered. The patient agreed with the plan and demonstrated an understanding of the instructions.   The patient was advised to call back or seek an in-person evaluation if the symptoms worsen or if the condition fails to improve as anticipated.   Total minutes including chart review and phone contact time: 6   Follow up plan: Return if symptoms worsen or fail to improve.  Claretta Fraise, MD [place]

## 2022-04-25 ENCOUNTER — Other Ambulatory Visit: Payer: Self-pay | Admitting: Family

## 2022-04-25 DIAGNOSIS — F1320 Sedative, hypnotic or anxiolytic dependence, uncomplicated: Secondary | ICD-10-CM

## 2022-04-25 DIAGNOSIS — F411 Generalized anxiety disorder: Secondary | ICD-10-CM

## 2022-04-25 DIAGNOSIS — Z79899 Other long term (current) drug therapy: Secondary | ICD-10-CM

## 2022-05-03 ENCOUNTER — Other Ambulatory Visit: Payer: Self-pay

## 2022-05-03 ENCOUNTER — Emergency Department (HOSPITAL_COMMUNITY): Payer: Medicare Other

## 2022-05-03 ENCOUNTER — Encounter (HOSPITAL_COMMUNITY): Payer: Self-pay

## 2022-05-03 ENCOUNTER — Emergency Department (HOSPITAL_COMMUNITY)
Admission: EM | Admit: 2022-05-03 | Discharge: 2022-05-03 | Disposition: A | Discharge status: Home / Self Care | Payer: Medicare Other | Attending: Emergency Medicine | Admitting: Emergency Medicine

## 2022-05-03 DIAGNOSIS — S52501A Unspecified fracture of the lower end of right radius, initial encounter for closed fracture: Secondary | ICD-10-CM | POA: Diagnosis not present

## 2022-05-03 DIAGNOSIS — S52351A Displaced comminuted fracture of shaft of radius, right arm, initial encounter for closed fracture: Secondary | ICD-10-CM | POA: Diagnosis not present

## 2022-05-03 DIAGNOSIS — S6291XA Unspecified fracture of right wrist and hand, initial encounter for closed fracture: Secondary | ICD-10-CM | POA: Diagnosis not present

## 2022-05-03 DIAGNOSIS — W010XXA Fall on same level from slipping, tripping and stumbling without subsequent striking against object, initial encounter: Secondary | ICD-10-CM | POA: Diagnosis not present

## 2022-05-03 DIAGNOSIS — S62101A Fracture of unspecified carpal bone, right wrist, initial encounter for closed fracture: Secondary | ICD-10-CM

## 2022-05-03 DIAGNOSIS — Y92512 Supermarket, store or market as the place of occurrence of the external cause: Secondary | ICD-10-CM | POA: Diagnosis not present

## 2022-05-03 DIAGNOSIS — S6991XA Unspecified injury of right wrist, hand and finger(s), initial encounter: Secondary | ICD-10-CM | POA: Diagnosis present

## 2022-05-03 DIAGNOSIS — M25531 Pain in right wrist: Secondary | ICD-10-CM | POA: Diagnosis not present

## 2022-05-03 DIAGNOSIS — Z743 Need for continuous supervision: Secondary | ICD-10-CM | POA: Diagnosis not present

## 2022-05-03 DIAGNOSIS — M25539 Pain in unspecified wrist: Secondary | ICD-10-CM | POA: Diagnosis not present

## 2022-05-03 MED ORDER — OXYCODONE HCL 5 MG PO TABS
10.0000 mg | ORAL_TABLET | Freq: Once | ORAL | Status: AC
  Administered 2022-05-03: 10 mg via ORAL
  Filled 2022-05-03: qty 2

## 2022-05-03 MED ORDER — LIDOCAINE-EPINEPHRINE (PF) 1 %-1:200000 IJ SOLN
20.0000 mL | Freq: Once | INTRAMUSCULAR | Status: AC
  Administered 2022-05-03: 20 mL
  Filled 2022-05-03: qty 30

## 2022-05-03 MED ORDER — OXYCODONE HCL 5 MG PO TABS: 5.0000 mg | ORAL_TABLET | ORAL | 0 refills | Status: DC | PRN

## 2022-05-03 NOTE — ED Notes (Signed)
Applied sling to right arm for support

## 2022-05-03 NOTE — ED Notes (Signed)
Patient right wrist supported with wrist splint placed  by EMS. Capillary refill brisk, no discoloration noted.

## 2022-05-03 NOTE — ED Provider Notes (Addendum)
[facility] EMERGENCY DEPARTMENT Provider Note   CSN: [id number] Arrival date & time: 05/03/22  1300     History Chief Complaint  Patient presents with   Wrist Pain    HPI Abigail Moss is a 76 y.o. female presenting for an obvious right wrist fracture.  Patient was at the store when she lost her balance falling onto her right arm in extension.  She has neurovascular function of the right upper extremity.  Has full range of motion of the hand.  She denies fevers or chills, nausea vomiting, syncope shortness of breath.  Patient describes a mechanical fall while in the store.   Patient's recorded medical, surgical, social, medication list and allergies were reviewed in the Snapshot window as part of the initial history.   Review of Systems   Review of Systems  Constitutional:  Negative for chills and fever.  HENT:  Negative for ear pain and sore throat.   Eyes:  Negative for pain and visual disturbance.  Respiratory:  Negative for cough and shortness of breath.   Cardiovascular:  Negative for chest pain and palpitations.  Gastrointestinal:  Negative for abdominal pain and vomiting.  Genitourinary:  Negative for dysuria and hematuria.  Musculoskeletal:  Negative for arthralgias and back pain.  Skin:  Negative for color change and rash.  Neurological:  Negative for seizures and syncope.  All other systems reviewed and are negative.   Physical Exam Updated Vital Signs BP 130/84 (BP Location: Left Arm)   Pulse 75   Temp 97.9 F (36.6 C) (Oral)   Resp 17   Ht 5\' 6"  (1.676 m)   Wt 76.7 kg   SpO2 98%   BMI 27.28 kg/m  Physical Exam Vitals and nursing note reviewed.  Constitutional:      General: She is not in acute distress.    Appearance: She is well-developed.  HENT:     Head: Normocephalic and atraumatic.  Eyes:     Conjunctiva/sclera: Conjunctivae normal.  Cardiovascular:     Rate and Rhythm: Normal rate and regular rhythm.     Heart sounds: No murmur  heard. Pulmonary:     Effort: Pulmonary effort is normal. No respiratory distress.     Breath sounds: Normal breath sounds.  Abdominal:     General: There is no distension.     Palpations: Abdomen is soft.     Tenderness: There is no abdominal tenderness. There is no right CVA tenderness or left CVA tenderness.  Musculoskeletal:        General: Tenderness and deformity present. No swelling. Normal range of motion.     Cervical back: Neck supple.  Skin:    General: Skin is warm and dry.  Neurological:     General: No focal deficit present.     Mental Status: She is alert and oriented to person, place, and time. Mental status is at baseline.     Cranial Nerves: No cranial nerve deficit.      ED Course/ Medical Decision Making/ A&P    Procedures Reduction of fracture  Date/Time: 05/03/2022 3:11 PM  Performed by: Tretha Sciara, MD Authorized by: Tretha Sciara, MD  Consent: Verbal consent obtained. Risks and benefits: risks, benefits and alternatives were discussed Consent given by: patient Patient understanding: patient states understanding of the procedure being performed Patient identity confirmed: verbally with patient Preparation: Patient was prepped and draped in the usual sterile fashion. Local anesthesia used: yes Anesthesia: local infiltration  Anesthesia: Local anesthesia used: yes Local Anesthetic:  lidocaine 1% with epinephrine Anesthetic total: 10 mL Patient tolerance: patient tolerated the procedure well with no immediate complications Comments: Postprocedural exam repeated.  Patient has palpable radial pulse.      Medications Ordered in ED Medications  oxyCODONE (Oxy IR/ROXICODONE) immediate release tablet 10 mg (10 mg Oral Given 05/03/22 1358)  lidocaine-EPINEPHrine (PF) (XYLOCAINE-EPINEPHrine) 1 %-1:200000 (PF) injection 20 mL (20 mLs Infiltration Given 05/03/22 1358)   Medical Decision Making:    Abigail Moss is a 76 y.o. female who  presented to the ED today with a moderate mechanisma trauma, detailed above.    Patient's presentation is complicated by their history of advanced age.  Patient placed on continuous vitals and telemetry monitoring while in ED which was reviewed periodically.   Given this mechanism of trauma, a full physical exam was performed. Notably, patient was HDS in NAD.   Reviewed and confirmed nursing documentation for past medical history, family history, social history.    Initial Assessment/Plan:   This is a patient presenting with a moderate mechanism trauma.  As such, I have considered intracranial injuries including intracranial hemorrhage, intrathoracic injuries including blunt myocardial or blunt lung injury, blunt abdominal injuries including aortic dissection, bladder injury, spleen injury, liver injury and I have considered orthopedic injuries including extremity or spinal injury.  With the patient's presentation of moderate mechanism trauma but an otherwise reassuring exam, patient warrants targeted evaluation for potential traumatic injuries. Will proceed with targeted evaluation for potential injuries. Will proceed with right wrist XR. Objective evaluation resulted with right distal radius fracture.   Final Reassessment and Plan:   Patient's fracture was reduced as above with improved alignment though still not anatomic.  Discussed with orthopedic surgeon on-call Dr. Dallas Schimke.  Given lack of focal neurovascular compromise, he feels comfortable with outpatient care and management.  Patient has bounding radial pulses cap refill less than 2 seconds and initial tingling of the fingertips now improving. Plan for close follow-up with orthopedics and strict return precautions reinforced.  Patient placed into a sugar-tong splint to hold position and patient discharged in no acute distress with strict return precautions reinforced.   Disposition:  I have considered need for hospitalization, however,  patient's current presentation favors discharge at this time.  Patient/family educated about specific return precautions for given chief complaint and symptoms.  Patient/family educated about follow-up with PCP and orthopedics.     Patient/family expressed understanding of return precautions and need for follow-up. Patient spoken to regarding all imaging and laboratory results and appropriate follow up for these results. All education provided in verbal form with additional information in written form. Time was allowed for answering of patient questions. Patient discharged.     Addendum:  While going over patient's discharge instructions, she endorsed concern that she might have recurrence of her pain.  She insisted she had no pain at this time but is worried about when the lidocaine wears off. Oxycodone prescription sent at this time.  Strict return precautions reinforced.  I discussed her x-ray results that there was not perfect alignment of the fracture and that we could consider reattempting reduction, however patient would prefer to follow-up with a hand surgeon at this time as her pain and symptoms are overall improved.  I think this is reasonable as given this degree of intra-articular involvement she likely require surgical intervention in the outpatient setting.  No acute indication for intervention at this time and patient stable for outpatient care and management.  Patient discharged as above.    Marland Kitchen  Emergency Department Medication Summary:   Medications  oxyCODONE (Oxy IR/ROXICODONE) immediate release tablet 10 mg (10 mg Oral Given 05/03/22 1358)  lidocaine-EPINEPHrine (PF) (XYLOCAINE-EPINEPHrine) 1 %-1:200000 (PF) injection 20 mL (20 mLs Infiltration Given 05/03/22 1358)          Clinical Impression:  1. Closed fracture of right wrist, initial encounter      Discharge   Final Clinical Impression(s) / ED Diagnoses Final diagnoses:  Closed fracture of right wrist, initial  encounter    Rx / DC Orders ED Discharge Orders     None         Glyn Ade, MD 05/03/22 3557    Glyn Ade, MD 05/03/22 [phone]

## 2022-05-03 NOTE — ED Triage Notes (Signed)
Patient presents to ED via [facility], patient reports shopping in [place] at a thrift shop, lost her balance and fell on her right wrist. No LOC and denies hitting her head.

## 2022-05-03 NOTE — ED Notes (Signed)
Assisted EDP with Sugar-Tong splint to right wrist. Patient tolerated procedure

## 2022-05-06 DIAGNOSIS — M25531 Pain in right wrist: Secondary | ICD-10-CM | POA: Diagnosis not present

## 2022-05-12 DIAGNOSIS — S52501A Unspecified fracture of the lower end of right radius, initial encounter for closed fracture: Secondary | ICD-10-CM | POA: Diagnosis not present

## 2022-05-17 ENCOUNTER — Other Ambulatory Visit: Payer: Self-pay | Admitting: Family

## 2022-05-17 DIAGNOSIS — I10 Essential (primary) hypertension: Secondary | ICD-10-CM

## 2022-05-19 ENCOUNTER — Other Ambulatory Visit: Payer: Self-pay | Admitting: Family

## 2022-05-20 DIAGNOSIS — S52501A Unspecified fracture of the lower end of right radius, initial encounter for closed fracture: Secondary | ICD-10-CM | POA: Diagnosis not present

## 2022-05-22 DIAGNOSIS — M79676 Pain in unspecified toe(s): Secondary | ICD-10-CM | POA: Diagnosis not present

## 2022-05-22 DIAGNOSIS — M2042 Other hammer toe(s) (acquired), left foot: Secondary | ICD-10-CM | POA: Diagnosis not present

## 2022-06-02 ENCOUNTER — Telehealth: Payer: Self-pay | Admitting: Family

## 2022-06-02 NOTE — Telephone Encounter (Signed)
  Prescription Request  06/02/2022  Is this a "Controlled Substance" medicine? yes  Have you seen your PCP in the last 2 weeks? Appt made for 12/11  If YES, route message to pool  -  If NO, patient needs to be scheduled for appointment.  What is the name of the medication or equipment? ALPRAZolam (XANAX) 0.5 MG tablet   Have you contacted your pharmacy to request a refill? yes   Which pharmacy would you like this sent to? [facility] in [place]   Patient notified that their request is being sent to the clinical staff for review and that they should receive a response within 2 business days.

## 2022-06-03 DIAGNOSIS — S52501A Unspecified fracture of the lower end of right radius, initial encounter for closed fracture: Secondary | ICD-10-CM | POA: Diagnosis not present

## 2022-06-03 NOTE — Telephone Encounter (Signed)
Pt not at home at this time, left message to call back regarding refill request. Refill can not be done until her appt on 06/16/22, this is a controlled medication and can only be done during a visit, also the last time this was refilled was 07/04/21,

## 2022-06-04 DIAGNOSIS — S52501A Unspecified fracture of the lower end of right radius, initial encounter for closed fracture: Secondary | ICD-10-CM | POA: Diagnosis not present

## 2022-06-06 ENCOUNTER — Ambulatory Visit (INDEPENDENT_AMBULATORY_CARE_PROVIDER_SITE_OTHER): Payer: Medicare Other | Admitting: Nurse Practitioner

## 2022-06-06 ENCOUNTER — Encounter: Payer: Self-pay | Admitting: Nurse Practitioner

## 2022-06-06 DIAGNOSIS — M25562 Pain in left knee: Secondary | ICD-10-CM

## 2022-06-06 DIAGNOSIS — M25561 Pain in right knee: Secondary | ICD-10-CM

## 2022-06-06 MED ORDER — PREDNISONE 10 MG (21) PO TBPK: ORAL_TABLET | ORAL | 0 refills | Status: DC

## 2022-06-06 NOTE — Patient Instructions (Signed)
Hip Pain The hip is the joint between the upper legs and the lower pelvis. The bones, cartilage, tendons, and muscles of your hip joint support your body and allow you to move around. Hip pain can range from a minor ache to severe pain in one or both of your hips. The pain may be felt on the inside of the hip joint near the groin, or on the outside near the buttocks and upper thigh. You may also have swelling or stiffness in your hip area. Follow these instructions at home: Managing pain, stiffness, and swelling     If directed, put ice on the painful area. To do this: Put ice in a plastic bag. Place a towel between your skin and the bag. Leave the ice on for 20 minutes, 2-3 times a day. If directed, apply heat to the affected area as often as told by your health care provider. Use the heat source that your health care provider recommends, such as a moist heat pack or a heating pad. Place a towel between your skin and the heat source. Leave the heat on for 20-30 minutes. Remove the heat if your skin turns bright red. This is especially important if you are unable to feel pain, heat, or cold. You may have a greater risk of getting burned. Activity Do exercises as told by your health care provider. Avoid activities that cause pain. General instructions  Take over-the-counter and prescription medicines only as told by your health care provider. Keep a journal of your symptoms. Write down: How often you have hip pain. The location of your pain. What the pain feels like. What makes the pain worse. Sleep with a pillow between your legs on your most comfortable side. Keep all follow-up visits as told by your health care provider. This is important. Contact a health care provider if: You cannot put weight on your leg. Your pain or swelling continues or gets worse after one week. It gets harder to walk. You have a fever. Get help right away if: You fall. You have a sudden increase in pain  and swelling in your hip. Your hip is red or swollen or very tender to touch. Summary Hip pain can range from a minor ache to severe pain in one or both of your hips. The pain may be felt on the inside of the hip joint near the groin, or on the outside near the buttocks and upper thigh. Avoid activities that cause pain. Write down how often you have hip pain, the location of the pain, what makes it worse, and what it feels like. This information is not intended to replace advice given to you by your health care provider. Make sure you discuss any questions you have with your health care provider. Document Revised: 11/08/2018 Document Reviewed: 11/08/2018 Elsevier Patient Education  2023 Elsevier Inc.  

## 2022-06-06 NOTE — Progress Notes (Signed)
   Virtual Visit  Note Due to COVID-19 pandemic this visit was conducted virtually. This visit type was conducted due to national recommendations for restrictions regarding the COVID-19 Pandemic (e.g. social distancing, sheltering in place) in an effort to limit this patient's exposure and mitigate transmission in our community. All issues noted in this document were discussed and addressed.  A physical exam was not performed with this format.  I connected with Abigail Moss on 06/06/22 at 9:15 am  by telephone and verified that I am speaking with the correct person using two identifiers. Abigail Moss is currently located at home during visit. The provider, Daryll Drown, NP is located in their office at time of visit.  I discussed the limitations, risks, security and privacy concerns of performing an evaluation and management service by telephone and the availability of in person appointments. I also discussed with the patient that there may be a patient responsible charge related to this service. The patient expressed understanding and agreed to proceed.   History and Present Illness:  Knee Pain  The incident occurred more than 1 week ago. The incident occurred at home. There was no injury mechanism. The pain is present in the right knee and left knee. The pain is moderate. The pain has been Constant since onset. She reports no foreign bodies present. The symptoms are aggravated by movement and palpation. Treatments tried: [employer]. The treatment provided mild relief.      Review of Systems  Constitutional: Negative.  Negative for fever.  HENT: Negative.    Respiratory: Negative.    Cardiovascular: Negative.   Gastrointestinal: Negative.   Genitourinary: Negative.   Musculoskeletal:  Positive for joint pain.       Bilateral knee pain  Skin: Negative.  Negative for itching and rash.  All other systems reviewed and are negative.    Observations/Objective: Tele-visit patient not in  distress  Assessment and Plan: Bilateral knee pain unresolved. This is a chronic problem for patient. She is unable to leave the house for assessment. She is currently using Icy hot patch and has been treated with steroids in the past with goo success.  Advised patient to elevate joints, continue all meds as prescribed.  Prednisone pack,  Follow Up Instructions: Follow up with unresolved symptoms    I discussed the assessment and treatment plan with the patient. The patient was provided an opportunity to ask questions and all were answered. The patient agreed with the plan and demonstrated an understanding of the instructions.   The patient was advised to call back or seek an in-person evaluation if the symptoms worsen or if the condition fails to improve as anticipated.  The above assessment and management plan was discussed with the patient. The patient verbalized understanding of and has agreed to the management plan. Patient is aware to call the clinic if symptoms persist or worsen. Patient is aware when to return to the clinic for a follow-up visit. Patient educated on when it is appropriate to go to the emergency department.   Time call ended:  9:27 am   I provided 12 minutes of  non face-to-face time during this encounter.    Daryll Drown, NP

## 2022-06-09 ENCOUNTER — Other Ambulatory Visit: Payer: Self-pay | Admitting: Family

## 2022-06-09 DIAGNOSIS — Z1231 Encounter for screening mammogram for malignant neoplasm of breast: Secondary | ICD-10-CM

## 2022-06-16 ENCOUNTER — Ambulatory Visit: Payer: Medicare Other | Admitting: Family

## 2022-06-17 DIAGNOSIS — S52501A Unspecified fracture of the lower end of right radius, initial encounter for closed fracture: Secondary | ICD-10-CM | POA: Diagnosis not present

## 2022-06-19 ENCOUNTER — Other Ambulatory Visit: Payer: Self-pay | Admitting: Family

## 2022-07-14 DIAGNOSIS — S52501A Unspecified fracture of the lower end of right radius, initial encounter for closed fracture: Secondary | ICD-10-CM | POA: Diagnosis not present

## 2022-07-17 ENCOUNTER — Ambulatory Visit (INDEPENDENT_AMBULATORY_CARE_PROVIDER_SITE_OTHER): Payer: Medicare Other | Admitting: Family

## 2022-07-17 ENCOUNTER — Encounter: Payer: Self-pay | Admitting: Family

## 2022-07-17 VITALS — BP 133/81 | HR 66 | Temp 97.6°F | Ht 66.0 in | Wt 167.0 lb

## 2022-07-17 DIAGNOSIS — M810 Age-related osteoporosis without current pathological fracture: Secondary | ICD-10-CM | POA: Diagnosis not present

## 2022-07-17 DIAGNOSIS — K219 Gastro-esophageal reflux disease without esophagitis: Secondary | ICD-10-CM

## 2022-07-17 DIAGNOSIS — E663 Overweight: Secondary | ICD-10-CM

## 2022-07-17 DIAGNOSIS — F411 Generalized anxiety disorder: Secondary | ICD-10-CM

## 2022-07-17 DIAGNOSIS — F1320 Sedative, hypnotic or anxiolytic dependence, uncomplicated: Secondary | ICD-10-CM | POA: Diagnosis not present

## 2022-07-17 DIAGNOSIS — E559 Vitamin D deficiency, unspecified: Secondary | ICD-10-CM | POA: Diagnosis not present

## 2022-07-17 DIAGNOSIS — Z79899 Other long term (current) drug therapy: Secondary | ICD-10-CM

## 2022-07-17 DIAGNOSIS — E039 Hypothyroidism, unspecified: Secondary | ICD-10-CM

## 2022-07-17 DIAGNOSIS — I10 Essential (primary) hypertension: Secondary | ICD-10-CM | POA: Diagnosis not present

## 2022-07-17 MED ORDER — ALENDRONATE SODIUM 70 MG PO TABS: ORAL_TABLET | ORAL | 1 refills | Status: DC

## 2022-07-17 MED ORDER — AMOXICILLIN-POT CLAVULANATE 875-125 MG PO TABS: 1.0000 | ORAL_TABLET | Freq: Two times a day (BID) | ORAL | 0 refills | Status: DC

## 2022-07-17 MED ORDER — ALPRAZOLAM 0.5 MG PO TABS: 0.5000 mg | ORAL_TABLET | Freq: Every evening | ORAL | 3 refills | Status: DC | PRN

## 2022-07-17 MED ORDER — LEVOTHYROXINE SODIUM 125 MCG PO TABS: 125.0000 ug | ORAL_TABLET | Freq: Every day | ORAL | 1 refills | Status: DC

## 2022-07-17 NOTE — Patient Instructions (Signed)
Health Maintenance After Age 79 After age 79, you are at a higher risk for certain long-term diseases and infections as well as injuries from falls. Falls are a major cause of broken bones and head injuries in people who are older than age 79. Getting regular preventive care can help to keep you healthy and well. Preventive care includes getting regular testing and making lifestyle changes as recommended by your health care provider. Talk with your health care provider about: Which screenings and tests you should have. A screening is a test that checks for a disease when you have no symptoms. A diet and exercise plan that is right for you. What should I know about screenings and tests to prevent falls? Screening and testing are the best ways to find a health problem early. Early diagnosis and treatment give you the best chance of managing medical conditions that are common after age 79. Certain conditions and lifestyle choices may make you more likely to have a fall. Your health care provider may recommend: Regular vision checks. Poor vision and conditions such as cataracts can make you more likely to have a fall. If you wear glasses, make sure to get your prescription updated if your vision changes. Medicine review. Work with your health care provider to regularly review all of the medicines you are taking, including over-the-counter medicines. Ask your health care provider about any side effects that may make you more likely to have a fall. Tell your health care provider if any medicines that you take make you feel dizzy or sleepy. Strength and balance checks. Your health care provider may recommend certain tests to check your strength and balance while standing, walking, or changing positions. Foot health exam. Foot pain and numbness, as well as not wearing proper footwear, can make you more likely to have a fall. Screenings, including: Osteoporosis screening. Osteoporosis is a condition that causes  the bones to get weaker and break more easily. Blood pressure screening. Blood pressure changes and medicines to control blood pressure can make you feel dizzy. Depression screening. You may be more likely to have a fall if you have a fear of falling, feel depressed, or feel unable to do activities that you used to do. Alcohol use screening. Using too much alcohol can affect your balance and may make you more likely to have a fall. Follow these instructions at home: Lifestyle Do not drink alcohol if: Your health care provider tells you not to drink. If you drink alcohol: Limit how much you have to: 0-1 drink a day for women. 0-2 drinks a day for men. Know how much alcohol is in your drink. In the [place] one drink equals one 12 oz bottle of beer (355 mL), one 5 oz glass of wine (148 mL), or one 1 oz glass of hard liquor (44 mL). Do not use any products that contain nicotine or tobacco. These products include cigarettes, chewing tobacco, and vaping devices, such as e-cigarettes. If you need help quitting, ask your health care provider. Activity  Follow a regular exercise program to stay fit. This will help you maintain your balance. Ask your health care provider what types of exercise are appropriate for you. If you need a cane or walker, use it as recommended by your health care provider. Wear supportive shoes that have nonskid soles. Safety  Remove any tripping hazards, such as rugs, cords, and clutter. Install safety equipment such as grab bars in bathrooms and safety rails on stairs. Keep rooms and walkways   well-lit. General instructions Talk with your health care provider about your risks for falling. Tell your health care provider if: You fall. Be sure to tell your health care provider about all falls, even ones that seem minor. You feel dizzy, tiredness (fatigue), or off-balance. Take over-the-counter and prescription medicines only as told by your health care provider. These include  supplements. Eat a healthy diet and maintain a healthy weight. A healthy diet includes low-fat dairy products, low-fat (lean) meats, and fiber from whole grains, beans, and lots of fruits and vegetables. Stay current with your vaccines. Schedule regular health, dental, and eye exams. Summary Having a healthy lifestyle and getting preventive care can help to protect your health and wellness after age 79. Screening and testing are the best way to find a health problem early and help you avoid having a fall. Early diagnosis and treatment give you the best chance for managing medical conditions that are more common for people who are older than age 79. Falls are a major cause of broken bones and head injuries in people who are older than age 79. Take precautions to prevent a fall at home. Work with your health care provider to learn what changes you can make to improve your health and wellness and to prevent falls. This information is not intended to replace advice given to you by your health care provider. Make sure you discuss any questions you have with your health care provider. Document Revised: 11/12/2020 Document Reviewed: 11/12/2020 Elsevier Patient Education  2023 Elsevier Inc.  

## 2022-07-17 NOTE — Progress Notes (Signed)
Subjective:    Patient ID: Abigail Moss, female    DOB: [birth year], 77 y.o.   MRN: [id number]  Chief Complaint  Patient presents with   Medical Management of Chronic Issues   Pt presents to the office today for chronic follow up. She is drinking Ensure as needed since COVID she has not been able to eat meats.     She has osteoporosis and takes Fosamax.  Last Dexa scan 04/11/20. She fell and broke her right wrist on 05/03/22. Followed by Ortho.   Hypertension This is a chronic problem. The current episode started more than 1 year ago. The problem has been resolved since onset. The problem is controlled. Associated symptoms include anxiety and malaise/fatigue. Pertinent negatives include no peripheral edema or shortness of breath. Risk factors for coronary artery disease include dyslipidemia and sedentary lifestyle. The current treatment provides moderate improvement. Identifiable causes of hypertension include a thyroid problem.  Gastroesophageal Reflux She complains of belching and heartburn. She reports no hoarse voice. This is a chronic problem. The current episode started more than 1 year ago. The problem occurs occasionally. The problem has been waxing and waning. Associated symptoms include fatigue. She has tried a PPI for the symptoms. The treatment provided moderate relief.  Thyroid Problem Presents for follow-up visit. Symptoms include anxiety and fatigue. Patient reports no cold intolerance, constipation, diarrhea or hoarse voice. The symptoms have been stable.  Back Pain This is a chronic problem. The current episode started more than 1 year ago. The problem occurs intermittently. The problem has been waxing and waning since onset. The pain is present in the lumbar spine. The quality of the pain is described as aching. The pain is at a severity of 0/10 (0 today, but can flare up to 7-8). She has tried analgesics for the symptoms. The treatment provided mild relief.  Anxiety Presents  for follow-up visit. Symptoms include excessive worry, irritability and nervous/anxious behavior. Patient reports no shortness of breath. Symptoms occur most days. The severity of symptoms is mild.        Review of Systems  Constitutional:  Positive for fatigue, irritability and malaise/fatigue.  HENT:  Negative for hoarse voice.   Respiratory:  Negative for shortness of breath.   Gastrointestinal:  Positive for heartburn. Negative for constipation and diarrhea.  Endocrine: Negative for cold intolerance.  Musculoskeletal:  Positive for back pain.  Psychiatric/Behavioral:  The patient is nervous/anxious.   All other systems reviewed and are negative.      Objective:   Physical Exam Vitals reviewed.  Constitutional:      General: She is not in acute distress.    Appearance: She is well-developed.  HENT:     Head: Normocephalic and atraumatic.     Right Ear: Tympanic membrane normal. Drainage present.     Left Ear: Tympanic membrane normal. Drainage present.  Eyes:     Pupils: Pupils are equal, round, and reactive to light.  Neck:     Thyroid: No thyromegaly.  Cardiovascular:     Rate and Rhythm: Normal rate and regular rhythm.     Heart sounds: Normal heart sounds. No murmur heard. Pulmonary:     Effort: Pulmonary effort is normal. No respiratory distress.     Breath sounds: Normal breath sounds. No wheezing.  Abdominal:     General: Bowel sounds are normal. There is no distension.     Palpations: Abdomen is soft.     Tenderness: There is no abdominal tenderness.  Musculoskeletal:  General: Tenderness present.     Cervical back: Normal range of motion and neck supple.     Comments: Right wrist in splint  Skin:    General: Skin is warm and dry.  Neurological:     Mental Status: She is alert and oriented to person, place, and time.     Cranial Nerves: No cranial nerve deficit.     Deep Tendon Reflexes: Reflexes are normal and symmetric.  Psychiatric:         Behavior: Behavior normal.        Thought Content: Thought content normal.        Judgment: Judgment normal.       BP 133/81   Pulse 66   Temp 97.6 F (36.4 C) (Temporal)   Ht 5\' 6"  (1.676 m)   Wt 167 lb (75.8 kg)   SpO2 93%   BMI 26.95 kg/m      Assessment & Plan:   Abigail Moss comes in today with chief complaint of Medical Management of Chronic Issues   Diagnosis and orders addressed:  1. Osteoporosis, unspecified osteoporosis type, unspecified pathological fracture presence - alendronate (FOSAMAX) 70 MG tablet; Take with a full glass of water on an empty stomach.  Dispense: 12 tablet; Refill: 1 - CMP14+EGFR - CBC with Differential/Platelet  2. GAD (generalized anxiety disorder) - ALPRAZolam (XANAX) 0.5 MG tablet; Take 1 tablet (0.5 mg total) by mouth at bedtime as needed.  Dispense: 30 tablet; Refill: 3 - CMP14+EGFR - CBC with Differential/Platelet  3. Benzodiazepine dependence (HCC) - ALPRAZolam (XANAX) 0.5 MG tablet; Take 1 tablet (0.5 mg total) by mouth at bedtime as needed.  Dispense: 30 tablet; Refill: 3 - CMP14+EGFR - CBC with Differential/Platelet  4. Controlled substance agreement signed - ALPRAZolam (XANAX) 0.5 MG tablet; Take 1 tablet (0.5 mg total) by mouth at bedtime as needed.  Dispense: 30 tablet; Refill: 3 - CMP14+EGFR - CBC with Differential/Platelet  5. Gastroesophageal reflux disease, unspecified whether esophagitis present - CMP14+EGFR - CBC with Differential/Platelet  6. Primary hypertension - CMP14+EGFR - CBC with Differential/Platelet  7. Hypothyroidism, unspecified type  - CMP14+EGFR - CBC with Differential/Platelet  8. Overweight (BMI 25.0-29.9)  - CMP14+EGFR - CBC with Differential/Platelet  9. Vitamin D deficiency  - CMP14+EGFR - CBC with Differential/Platelet   Labs pending Patient reviewed in [place] controlled database, no flags noted. Contract and drug screen are up to date.  Health Maintenance reviewed Diet and  exercise encouraged  Follow up plan: 3 months   [name], FNP

## 2022-07-18 LAB — CBC WITH DIFFERENTIAL/PLATELET
Basophils Absolute: 0.1 10*3/uL (ref 0.0–0.2)
Basos: 1 %
EOS (ABSOLUTE): 0.1 10*3/uL (ref 0.0–0.4)
Eos: 2 %
Hematocrit: 33.3 % — ABNORMAL LOW (ref 34.0–46.6)
Hemoglobin: 11 g/dL — ABNORMAL LOW (ref 11.1–15.9)
Immature Grans (Abs): 0 10*3/uL (ref 0.0–0.1)
Immature Granulocytes: 0 %
Lymphocytes Absolute: 1.9 10*3/uL (ref 0.7–3.1)
Lymphs: 38 %
MCH: 29.8 pg (ref 26.6–33.0)
MCHC: 33 g/dL (ref 31.5–35.7)
MCV: 90 fL (ref 79–97)
Monocytes Absolute: 0.3 10*3/uL (ref 0.1–0.9)
Monocytes: 6 %
Neutrophils Absolute: 2.7 10*3/uL (ref 1.4–7.0)
Neutrophils: 53 %
Platelets: 280 10*3/uL (ref 150–450)
RBC: 3.69 x10E6/uL — ABNORMAL LOW (ref 3.77–5.28)
RDW: 14.1 % (ref 11.7–15.4)
WBC: 5.1 10*3/uL (ref 3.4–10.8)

## 2022-07-18 LAB — CMP14+EGFR
ALT: 17 IU/L (ref 0–32)
AST: 15 IU/L (ref 0–40)
Albumin/Globulin Ratio: 1.9 (ref 1.2–2.2)
Albumin: 4 g/dL (ref 3.8–4.8)
Alkaline Phosphatase: 57 IU/L (ref 44–121)
BUN/Creatinine Ratio: 19 (ref 12–28)
BUN: 19 mg/dL (ref 8–27)
Bilirubin Total: 0.3 mg/dL (ref 0.0–1.2)
CO2: 26 mmol/L (ref 20–29)
Calcium: 9 mg/dL (ref 8.7–10.3)
Chloride: 107 mmol/L — ABNORMAL HIGH (ref 96–106)
Creatinine, Ser: 0.99 mg/dL (ref 0.57–1.00)
Globulin, Total: 2.1 g/dL (ref 1.5–4.5)
Glucose: 83 mg/dL (ref 70–99)
Potassium: 3.4 mmol/L — ABNORMAL LOW (ref 3.5–5.2)
Sodium: 145 mmol/L — ABNORMAL HIGH (ref 134–144)
Total Protein: 6.1 g/dL (ref 6.0–8.5)
eGFR: 58 mL/min/{1.73_m2} — ABNORMAL LOW (ref >59)

## 2022-07-21 ENCOUNTER — Telehealth: Payer: Self-pay | Admitting: *Deleted

## 2022-07-21 DIAGNOSIS — M25531 Pain in right wrist: Secondary | ICD-10-CM | POA: Diagnosis not present

## 2022-07-21 DIAGNOSIS — G894 Chronic pain syndrome: Secondary | ICD-10-CM | POA: Diagnosis not present

## 2022-07-21 DIAGNOSIS — M5136 Other intervertebral disc degeneration, lumbar region: Secondary | ICD-10-CM | POA: Diagnosis not present

## 2022-07-21 DIAGNOSIS — M810 Age-related osteoporosis without current pathological fracture: Secondary | ICD-10-CM

## 2022-07-21 DIAGNOSIS — M5416 Radiculopathy, lumbar region: Secondary | ICD-10-CM | POA: Diagnosis not present

## 2022-07-21 MED ORDER — ALENDRONATE SODIUM 70 MG PO TABS: ORAL_TABLET | ORAL | 1 refills | Status: DC

## 2022-07-21 NOTE — Telephone Encounter (Signed)
Fax from [facility] Re: Alendronate 70 mg tab Clarification needed on frequency for script Corrected directions and sent Rx electronically

## 2022-07-26 ENCOUNTER — Other Ambulatory Visit: Payer: Self-pay | Admitting: Family

## 2022-07-26 DIAGNOSIS — I10 Essential (primary) hypertension: Secondary | ICD-10-CM

## 2022-08-04 ENCOUNTER — Other Ambulatory Visit: Payer: Self-pay | Admitting: Family

## 2022-08-04 DIAGNOSIS — K219 Gastro-esophageal reflux disease without esophagitis: Secondary | ICD-10-CM

## 2022-08-04 DIAGNOSIS — F411 Generalized anxiety disorder: Secondary | ICD-10-CM

## 2022-08-05 ENCOUNTER — Encounter: Payer: Self-pay | Admitting: Family

## 2022-08-05 ENCOUNTER — Telehealth (INDEPENDENT_AMBULATORY_CARE_PROVIDER_SITE_OTHER): Payer: Medicare Other | Admitting: Family

## 2022-08-05 DIAGNOSIS — M2550 Pain in unspecified joint: Secondary | ICD-10-CM

## 2022-08-05 DIAGNOSIS — R067 Sneezing: Secondary | ICD-10-CM | POA: Diagnosis not present

## 2022-08-05 DIAGNOSIS — J029 Acute pharyngitis, unspecified: Secondary | ICD-10-CM

## 2022-08-05 DIAGNOSIS — R519 Headache, unspecified: Secondary | ICD-10-CM | POA: Diagnosis not present

## 2022-08-05 DIAGNOSIS — R051 Acute cough: Secondary | ICD-10-CM

## 2022-08-05 DIAGNOSIS — R6889 Other general symptoms and signs: Secondary | ICD-10-CM

## 2022-08-05 DIAGNOSIS — J3489 Other specified disorders of nose and nasal sinuses: Secondary | ICD-10-CM | POA: Diagnosis not present

## 2022-08-05 MED ORDER — BENZONATATE 200 MG PO CAPS: 200.0000 mg | ORAL_CAPSULE | Freq: Three times a day (TID) | ORAL | 1 refills | Status: DC | PRN

## 2022-08-05 MED ORDER — OSELTAMIVIR PHOSPHATE 75 MG PO CAPS: 75.0000 mg | ORAL_CAPSULE | Freq: Two times a day (BID) | ORAL | 0 refills | Status: DC

## 2022-08-05 NOTE — Progress Notes (Signed)
Virtual Visit  Note Due to COVID-19 pandemic this visit was conducted virtually. This visit type was conducted due to national recommendations for restrictions regarding the COVID-19 Pandemic (e.g. social distancing, sheltering in place) in an effort to limit this patient's exposure and mitigate transmission in our community. All issues noted in this document were discussed and addressed.  A physical exam was not performed with this format.  I connected with Abigail Moss on 08/05/22 at 12:08 pm by telephone and verified that I am speaking with the correct person using two identifiers. LIADAN GUIZAR is currently located at home and no one is currently with her during visit. The provider, [name], FNP is located in their office at time of visit.  I discussed the limitations, risks, security and privacy concerns of performing an evaluation and management service by telephone and the availability of in person appointments. I also discussed with the patient that there may be a patient responsible charge related to this service. The patient expressed understanding and agreed to proceed.  Ms. Abigail Moss, Abigail Moss are scheduled for a virtual visit with your provider today.    Just as we do with appointments in the office, we must obtain your consent to participate.  Your consent will be active for this visit and any virtual visit you may have with one of our providers in the next 365 days.    If you have a MyChart account, I can also send a copy of this consent to you electronically.  All virtual visits are billed to your insurance company just like a traditional visit in the office.  As this is a virtual visit, video technology does not allow for your provider to perform a traditional examination.  This may limit your provider's ability to fully assess your condition.  If your provider identifies any concerns that need to be evaluated in person or the need to arrange testing such as labs, EKG, etc, we will make  arrangements to do so.    Although advances in technology are sophisticated, we cannot ensure that it will always work on either your end or our end.  If the connection with a video visit is poor, we may have to switch to a telephone visit.  With either a video or telephone visit, we are not always able to ensure that we have a secure connection.   I need to obtain your verbal consent now.   Are you willing to proceed with your visit today?   Abigail Moss has provided verbal consent on 08/05/2022 for a virtual visit (video or telephone).   [name], [place] 08/05/2022  12:10 PM   History and Present Illness:  Pt calls the office today with flu like symptoms that started yesterday. Had a negative COVID test.  URI  This is a new problem. The current episode started yesterday. The problem has been gradually worsening. There has been no fever. Associated symptoms include congestion, coughing, ear pain, headaches, joint pain, joint swelling, rhinorrhea, sinus pain, sneezing and a sore throat. She has tried acetaminophen for the symptoms. The treatment provided mild relief.      Review of Systems  HENT:  Positive for congestion, ear pain, rhinorrhea, sinus pain, sneezing and sore throat.   Respiratory:  Positive for cough.   Musculoskeletal:  Positive for joint pain.  Neurological:  Positive for headaches.  All other systems reviewed and are negative.    Observations/Objective: No SOB or distress noted, nasal congestion   Assessment and  Plan: 1. Flu-like symptoms Rest Force fluids  Tylenol as needed Start Tamiflu  Follow up if symptoms worsen or do not improve  - oseltamivir (TAMIFLU) 75 MG capsule; Take 1 capsule (75 mg total) by mouth 2 (two) times daily.  Dispense: 10 capsule; Refill: 0 - benzonatate (TESSALON) 200 MG capsule; Take 1 capsule (200 mg total) by mouth 3 (three) times daily as needed.  Dispense: 30 capsule; Refill: 1     I discussed the assessment and treatment  plan with the patient. The patient was provided an opportunity to ask questions and all were answered. The patient agreed with the plan and demonstrated an understanding of the instructions.   The patient was advised to call back or seek an in-person evaluation if the symptoms worsen or if the condition fails to improve as anticipated.  The above assessment and management plan was discussed with the patient. The patient verbalized understanding of and has agreed to the management plan. Patient is aware to call the clinic if symptoms persist or worsen. Patient is aware when to return to the clinic for a follow-up visit. Patient educated on when it is appropriate to go to the emergency department.   Time call ended: 12:19 pm     I provided 11 minutes of  non face-to-face time during this encounter.    [name], FNP

## 2022-08-05 NOTE — Patient Instructions (Signed)

## 2022-08-26 DIAGNOSIS — S52501A Unspecified fracture of the lower end of right radius, initial encounter for closed fracture: Secondary | ICD-10-CM | POA: Diagnosis not present

## 2022-09-02 DIAGNOSIS — M2042 Other hammer toe(s) (acquired), left foot: Secondary | ICD-10-CM | POA: Diagnosis not present

## 2022-09-02 DIAGNOSIS — M79676 Pain in unspecified toe(s): Secondary | ICD-10-CM | POA: Diagnosis not present

## 2022-09-24 ENCOUNTER — Other Ambulatory Visit: Payer: Self-pay | Admitting: Family

## 2022-10-07 DIAGNOSIS — L97511 Non-pressure chronic ulcer of other part of right foot limited to breakdown of skin: Secondary | ICD-10-CM | POA: Diagnosis not present

## 2022-11-03 ENCOUNTER — Ambulatory Visit: Payer: Medicare Other | Admitting: Family

## 2022-11-04 ENCOUNTER — Ambulatory Visit (INDEPENDENT_AMBULATORY_CARE_PROVIDER_SITE_OTHER): Payer: Medicare Other | Admitting: Family Medicine

## 2022-11-04 ENCOUNTER — Encounter: Payer: Self-pay | Admitting: Family Medicine

## 2022-11-04 VITALS — BP 145/88 | HR 74 | Temp 98.0°F | Resp 20 | Ht 66.0 in | Wt 167.0 lb

## 2022-11-04 DIAGNOSIS — M5416 Radiculopathy, lumbar region: Secondary | ICD-10-CM | POA: Diagnosis not present

## 2022-11-04 DIAGNOSIS — I70203 Unspecified atherosclerosis of native arteries of extremities, bilateral legs: Secondary | ICD-10-CM | POA: Diagnosis not present

## 2022-11-04 DIAGNOSIS — M5136 Other intervertebral disc degeneration, lumbar region: Secondary | ICD-10-CM

## 2022-11-04 DIAGNOSIS — M79676 Pain in unspecified toe(s): Secondary | ICD-10-CM | POA: Diagnosis not present

## 2022-11-04 DIAGNOSIS — L84 Corns and callosities: Secondary | ICD-10-CM | POA: Diagnosis not present

## 2022-11-04 DIAGNOSIS — M5441 Lumbago with sciatica, right side: Secondary | ICD-10-CM

## 2022-11-04 DIAGNOSIS — G8929 Other chronic pain: Secondary | ICD-10-CM

## 2022-11-04 DIAGNOSIS — R030 Elevated blood-pressure reading, without diagnosis of hypertension: Secondary | ICD-10-CM | POA: Diagnosis not present

## 2022-11-04 DIAGNOSIS — B351 Tinea unguium: Secondary | ICD-10-CM | POA: Diagnosis not present

## 2022-11-04 MED ORDER — METHOCARBAMOL 500 MG PO TABS: 500.0000 mg | ORAL_TABLET | Freq: Three times a day (TID) | ORAL | 0 refills | Status: DC | PRN

## 2022-11-04 MED ORDER — KETOROLAC TROMETHAMINE 30 MG/ML IJ SOLN
30.0000 mg | Freq: Once | INTRAMUSCULAR | Status: AC
  Administered 2022-11-04: 30 mg via INTRAMUSCULAR

## 2022-11-04 NOTE — Progress Notes (Signed)
Acute Office Visit  Subjective:  Patient ID: Abigail Moss, female    DOB: 1946-01-07, 77 y.o.   MRN: [id number]  HPI Patient is in today for hip pain. Reports that she has spinal stenosis. Pain is in her right hip and moves down her right leg. She reports that she was doing some work outdoors and grocery shopping where she had to lift her groceries. She believes that she overdid it.  Has an appointment with Dr. Ethelene Hal 5/10 for steroid injection therapy for lumbar stenosis. She had it completed last year and it helped her. She reports that she found 3 prednisone at home and took them to assist with her pain. She is also taking Norco for chronic pain. She also takes Voltaren. Reports that none of these are helping. She has been using ice and heating pad for the last 3 days. States that the heating pad helps some, but the ice pack helps more. Aggravated by walking and sitting. Denies fever. Denies incontinence, saddle anesthesia.   ROS As per HPI   Objective:  BP (!) 145/88   Pulse 74   Temp 98 F (36.7 C) (Oral)   Resp 20   Ht 5\' 6"  (1.676 m)   Wt 167 lb (75.8 kg)   SpO2 97%   BMI 26.95 kg/m    Physical Exam Constitutional:      General: She is not in acute distress.    Appearance: Normal appearance. She is not ill-appearing, toxic-appearing or diaphoretic.  Cardiovascular:     Rate and Rhythm: Normal rate.     Pulses: Normal pulses.     Heart sounds: Normal heart sounds. No murmur heard.    No gallop.  Pulmonary:     Effort: Pulmonary effort is normal. No respiratory distress.     Breath sounds: Normal breath sounds. No stridor. No wheezing, rhonchi or rales.  Musculoskeletal:     Lumbar back: Tenderness and bony tenderness present. No swelling, edema, deformity, signs of trauma or lacerations. Decreased range of motion. No scoliosis.     Right hip: Tenderness and bony tenderness present. No deformity, lacerations or crepitus. Decreased range of motion. Decreased strength.      Left hip: Decreased strength.     Comments: Unable to perform SLR due to patient unable to lie down.   Skin:    General: Skin is warm.     Capillary Refill: Capillary refill takes less than 2 seconds.  Neurological:     General: No focal deficit present.     Mental Status: She is alert and oriented to person, place, and time. Mental status is at baseline.     Motor: No weakness.  Psychiatric:        Mood and Affect: Mood normal.        Behavior: Behavior normal.        Thought Content: Thought content normal.        Judgment: Judgment normal.    Assessment & Plan:  1. Lumbar radiculopathy 2. Degeneration of lumbar intervertebral disc 3. Chronic right-sided low back pain with right-sided sciatica Medication and injection as below for acute on chronic exacerbation of pain. Patient has an appointment next week with Dr. Ethelene Hal with ortho, do not want to prednisone burst her or give her steroid injection at this time to impede her injection next week. Reviewed GFR and decided to do lower dose of Toradol. Instructed patient to hold diclofenac dose tonight. Reviewed allergy list with patient and she reported that  she did not have concerning allergy with baclofen in the past. Instructed her to follow up closely if she develops any red flag symptoms.  - methocarbamol (ROBAXIN) 500 MG tablet; Take 1 tablet (500 mg total) by mouth every 8 (eight) hours as needed for up to 7 days for muscle spasms.  Dispense: 21 tablet; Refill: 0 - ketorolac (TORADOL) 30 MG/ML injection 30 mg  4. Elevated blood pressure reading Patient had elevated BP today. Is established with PCP. Instructed her to monitor her BP at home and to follow up with closely with PCP.   The above assessment and management plan was discussed with the patient. The patient verbalized understanding of and has agreed to the management plan using shared-decision making. Patient is aware to call the clinic if they develop any new symptoms or if  symptoms fail to improve or worsen. Patient is aware when to return to the clinic for a follow-up visit. Patient educated on when it is appropriate to go to the emergency department.   Neale Burly, DNP-FNP [facility] [address] [place], [place] [address] [phone]

## 2022-11-11 ENCOUNTER — Ambulatory Visit: Payer: Medicare Other | Admitting: Family Medicine

## 2022-11-11 ENCOUNTER — Encounter: Payer: Self-pay | Admitting: Family

## 2022-11-11 ENCOUNTER — Ambulatory Visit (INDEPENDENT_AMBULATORY_CARE_PROVIDER_SITE_OTHER): Payer: Medicare Other | Admitting: Family

## 2022-11-11 ENCOUNTER — Other Ambulatory Visit: Payer: Self-pay | Admitting: Family Medicine

## 2022-11-11 DIAGNOSIS — G8929 Other chronic pain: Secondary | ICD-10-CM | POA: Diagnosis not present

## 2022-11-11 DIAGNOSIS — M51369 Other intervertebral disc degeneration, lumbar region without mention of lumbar back pain or lower extremity pain: Secondary | ICD-10-CM

## 2022-11-11 DIAGNOSIS — M5136 Other intervertebral disc degeneration, lumbar region: Secondary | ICD-10-CM

## 2022-11-11 DIAGNOSIS — M5441 Lumbago with sciatica, right side: Secondary | ICD-10-CM

## 2022-11-11 DIAGNOSIS — M5416 Radiculopathy, lumbar region: Secondary | ICD-10-CM | POA: Diagnosis not present

## 2022-11-11 MED ORDER — METHYLPREDNISOLONE ACETATE 80 MG/ML IJ SUSP
80.0000 mg | Freq: Once | INTRAMUSCULAR | Status: AC
  Administered 2022-11-11: 80 mg via INTRAMUSCULAR

## 2022-11-11 MED ORDER — METHOCARBAMOL 500 MG PO TABS: ORAL_TABLET | ORAL | 1 refills | Status: DC

## 2022-11-11 NOTE — Patient Instructions (Signed)
Sciatica  Sciatica is pain, numbness, weakness, or tingling along the path of the sciatic nerve. The sciatic nerve starts in the lower back and runs down the back of each leg. The nerve controls the muscles in the lower leg and in the back of the knee. It also provides feeling (sensation) to the back of the thigh, the lower leg, and the sole of the foot. Sciatica is a symptom of another medical condition that pinches or puts pressure on the sciatic nerve. Sciatica most often only affects one side of the body. Sciatica usually goes away on its own or with treatment. In some cases, sciatica may come back (recur). What are the causes? This condition is caused by pressure on the sciatic nerve or pinching of the nerve. This may be the result of: A disk in between the bones of the spine bulging out too far (herniated disk). Age-related changes in the spinal disks. A pain disorder that affects a muscle in the buttock. Extra bone growth near the sciatic nerve. A break (fracture) of the pelvis. Pregnancy. Tumor. This is rare. What increases the risk? The following factors may make you more likely to develop this condition: Playing sports that place pressure or stress on the spine. Having poor strength and flexibility. A history of back injury or surgery. Sitting for long periods of time. Doing activities that involve repetitive bending or lifting. Obesity. What are the signs or symptoms? Symptoms can vary from mild to very severe. They may include: Any of the following problems in the lower back, leg, hip, or buttock: Mild tingling, numbness, or dull aches. Burning sensations. Sharp pains. Numbness in the back of the calf or the sole of the foot. Leg weakness. Severe back pain that makes movement difficult. Symptoms may get worse when you cough, sneeze, or laugh, or when you sit or stand for long periods of time. How is this diagnosed? This condition may be diagnosed based on: Your symptoms  and medical history. A physical exam. Blood tests. Imaging tests, such as: X-rays. An MRI. A CT scan. How is this treated? In many cases, this condition improves on its own without treatment. However, treatment may include: Reducing or modifying physical activity. Exercising, including strengthening and stretching. Icing and applying heat to the affected area. Medicines that help to: Relieve pain and swelling. Relax your muscles. Injections of medicines that help to relieve pain and inflammation (steroids) around the sciatic nerve. Surgery. Follow these instructions at home: Medicines Take over-the-counter and prescription medicines only as told by your health care provider. Ask your health care provider if the medicine prescribed to you requires you to avoid driving or using heavy machinery. Managing pain     If directed, put ice on the affected area. To do this: Put ice in a plastic bag. Place a towel between your skin and the bag. Leave the ice on for 20 minutes, 2-3 times a day. If your skin turns bright red, remove the ice right away to prevent skin damage. The risk of skin damage is higher if you cannot feel pain, heat, or cold. If directed, apply heat to the affected area as often as told by your health care provider. Use the heat source that your health care provider recommends, such as a moist heat pack or a heating pad. Place a towel between your skin and the heat source. Leave the heat on for 20-30 minutes. If your skin turns bright red, remove the heat right away to prevent burns. The   risk of burns is higher if you cannot feel pain, heat, or cold. Activity  Return to your normal activities as told by your health care provider. Ask your health care provider what activities are safe for you. Avoid activities that make your symptoms worse. Take brief periods of rest throughout the day. When you rest for longer periods, mix in some mild activity or stretching between  periods of rest. This will help to prevent stiffness and pain. Avoid sitting for long periods of time without moving. Get up and move around at least one time each hour. Exercise and stretch regularly as told by your health care provider. Do not lift anything that is heavier than 10 lb (4.5 kg) until your health care provider says that it is safe. When you do not have symptoms, you should still avoid heavy lifting, especially repetitive heavy lifting. When you lift objects, always use proper lifting technique, which includes: Bending your knees. Keeping the load close to your body. Avoiding twisting. General instructions Maintain a healthy weight. Excess weight puts extra stress on your back. Wear supportive, comfortable shoes. Avoid wearing high heels. Avoid sleeping on a mattress that is too soft or too hard. A mattress that is firm enough to support your back when you sleep may help to reduce your pain. Contact a health care provider if: Your pain is not controlled by medicine. Your pain does not improve or gets worse. Your pain lasts longer than 4 weeks. You have unexplained weight loss. Get help right away if: You are not able to control when you urinate or have bowel movements (incontinence). You have: Weakness in your lower back, pelvis, buttocks, or legs that gets worse. Redness or swelling of your back. A burning sensation when you urinate. Summary Sciatica is pain, numbness, weakness, or tingling along the path of the sciatic nerve, which may include the lower back, legs, hips, and buttocks. This condition is caused by pressure on the sciatic nerve or pinching of the nerve. Treatment often includes rest, exercise, medicines, and applying ice or heat. This information is not intended to replace advice given to you by your health care provider. Make sure you discuss any questions you have with your health care provider. Document Revised: 09/30/2021 Document Reviewed:  09/30/2021 Elsevier Patient Education  2023 [employer]  

## 2022-11-11 NOTE — Progress Notes (Signed)
Subjective:    Patient ID: Abigail Moss, female    DOB: 1945-08-19, 77 y.o.   MRN: [id number]  Chief Complaint  Patient presents with   Hip Pain    Right hip wants shot    Pt presents to the office today with lower back/hip pain that radiates down right leg. She is followed by Ortho and getting lumbar epidural steroid injection on Friday. However, she reports her pain is a 10 out 10 and can not wait. She called her ortho and said it was ok to get steroid injection today.   She has had to hold diclofenac 5 days prior to her injection. She has been able to take her Norco and muscle relaxer with mild relief.  Hip Pain  The incident occurred more than 1 week ago.  Back Pain This is a chronic problem. The current episode started more than 1 year ago. The problem occurs intermittently. The problem has been waxing and waning since onset. The pain is present in the lumbar spine. The quality of the pain is described as aching. The pain radiates to the right thigh. The pain is at a severity of 10/10. The pain is moderate. The symptoms are aggravated by standing and twisting. Associated symptoms include leg pain.      Review of Systems  Musculoskeletal:  Positive for back pain.  All other systems reviewed and are negative.      Objective:   Physical Exam Vitals reviewed.  Constitutional:      General: She is not in acute distress.    Appearance: She is well-developed.  HENT:     Head: Normocephalic and atraumatic.  Eyes:     Pupils: Pupils are equal, round, and reactive to light.  Neck:     Thyroid: No thyromegaly.  Cardiovascular:     Rate and Rhythm: Normal rate and regular rhythm.     Heart sounds: Normal heart sounds. No murmur heard. Pulmonary:     Effort: Pulmonary effort is normal. No respiratory distress.     Breath sounds: Normal breath sounds. No wheezing.  Abdominal:     General: Bowel sounds are normal. There is no distension.     Palpations: Abdomen is soft.      Tenderness: There is no abdominal tenderness.  Musculoskeletal:        General: No tenderness. Normal range of motion.     Cervical back: Normal range of motion and neck supple.  Skin:    General: Skin is warm and dry.  Neurological:     Mental Status: She is alert and oriented to person, place, and time.     Cranial Nerves: No cranial nerve deficit.     Deep Tendon Reflexes: Reflexes are normal and symmetric.  Psychiatric:        Behavior: Behavior normal.        Thought Content: Thought content normal.        Judgment: Judgment normal.     BP 115/70   Pulse 78   Temp 97.6 F (36.4 C) (Temporal)   Ht 5\' 6"  (1.676 m)   SpO2 97%   BMI 26.95 kg/m       Assessment & Plan:  Abigail Moss comes in today with chief complaint of Hip Pain (Right hip wants shot )   Diagnosis and orders addressed:  1. Degeneration of lumbar intervertebral disc - methocarbamol (ROBAXIN) 500 MG tablet; TAKE 1 TABLET BY MOUTH EVERY 8 (EIGHT) HOURS AS NEEDED FOR UP TO  7 DAYS FOR MUSCLE SPASMS.  Dispense: 90 tablet; Refill: 1 - methylPREDNISolone acetate (DEPO-MEDROL) injection 80 mg  2. Lumbar radiculopathy - methocarbamol (ROBAXIN) 500 MG tablet; TAKE 1 TABLET BY MOUTH EVERY 8 (EIGHT) HOURS AS NEEDED FOR UP TO 7 DAYS FOR MUSCLE SPASMS.  Dispense: 90 tablet; Refill: 1 - methylPREDNISolone acetate (DEPO-MEDROL) injection 80 mg  3. Chronic right-sided low back pain with right-sided sciatica - methocarbamol (ROBAXIN) 500 MG tablet; TAKE 1 TABLET BY MOUTH EVERY 8 (EIGHT) HOURS AS NEEDED FOR UP TO 7 DAYS FOR MUSCLE SPASMS.  Dispense: 90 tablet; Refill: 1 - methylPREDNISolone acetate (DEPO-MEDROL) injection 80 mg  Rest Ice  Keep follow up Ortho Sedation precautions with Robaxin   Jannifer Rodney, FNP

## 2022-11-12 ENCOUNTER — Ambulatory Visit: Payer: Medicare Other

## 2022-11-14 DIAGNOSIS — M5416 Radiculopathy, lumbar region: Secondary | ICD-10-CM | POA: Diagnosis not present

## 2022-11-19 DIAGNOSIS — Z79899 Other long term (current) drug therapy: Secondary | ICD-10-CM | POA: Diagnosis not present

## 2022-11-19 DIAGNOSIS — Z5181 Encounter for therapeutic drug level monitoring: Secondary | ICD-10-CM | POA: Diagnosis not present

## 2022-11-19 DIAGNOSIS — M5416 Radiculopathy, lumbar region: Secondary | ICD-10-CM | POA: Diagnosis not present

## 2022-11-19 DIAGNOSIS — M5136 Other intervertebral disc degeneration, lumbar region: Secondary | ICD-10-CM | POA: Diagnosis not present

## 2022-11-29 DIAGNOSIS — M5416 Radiculopathy, lumbar region: Secondary | ICD-10-CM | POA: Diagnosis not present

## 2022-11-29 DIAGNOSIS — M5136 Other intervertebral disc degeneration, lumbar region: Secondary | ICD-10-CM | POA: Diagnosis not present

## 2022-12-05 DIAGNOSIS — M5116 Intervertebral disc disorders with radiculopathy, lumbar region: Secondary | ICD-10-CM | POA: Diagnosis not present

## 2022-12-05 DIAGNOSIS — M5416 Radiculopathy, lumbar region: Secondary | ICD-10-CM | POA: Diagnosis not present

## 2022-12-29 ENCOUNTER — Other Ambulatory Visit: Payer: Self-pay | Admitting: Family

## 2022-12-29 DIAGNOSIS — M810 Age-related osteoporosis without current pathological fracture: Secondary | ICD-10-CM

## 2022-12-31 DIAGNOSIS — M5416 Radiculopathy, lumbar region: Secondary | ICD-10-CM | POA: Diagnosis not present

## 2023-01-02 ENCOUNTER — Other Ambulatory Visit: Payer: Self-pay | Admitting: Family

## 2023-01-02 DIAGNOSIS — F411 Generalized anxiety disorder: Secondary | ICD-10-CM

## 2023-01-03 ENCOUNTER — Other Ambulatory Visit: Payer: Self-pay | Admitting: Family

## 2023-01-03 DIAGNOSIS — I10 Essential (primary) hypertension: Secondary | ICD-10-CM

## 2023-01-05 NOTE — Telephone Encounter (Signed)
[name] NTBS in Aug for 6 mos FU RF sent to pharmacy 

## 2023-01-05 NOTE — Telephone Encounter (Signed)
Appt scheduled for 02/05/23

## 2023-01-20 DIAGNOSIS — M5451 Vertebrogenic low back pain: Secondary | ICD-10-CM | POA: Diagnosis not present

## 2023-01-21 ENCOUNTER — Other Ambulatory Visit: Payer: Self-pay | Admitting: Family

## 2023-01-21 DIAGNOSIS — Z79899 Other long term (current) drug therapy: Secondary | ICD-10-CM

## 2023-01-21 DIAGNOSIS — F411 Generalized anxiety disorder: Secondary | ICD-10-CM

## 2023-01-21 DIAGNOSIS — F1320 Sedative, hypnotic or anxiolytic dependence, uncomplicated: Secondary | ICD-10-CM

## 2023-01-27 ENCOUNTER — Other Ambulatory Visit: Payer: Self-pay

## 2023-01-27 ENCOUNTER — Ambulatory Visit: Payer: Medicare Other | Attending: Orthopedic Surgery

## 2023-01-27 DIAGNOSIS — M5416 Radiculopathy, lumbar region: Secondary | ICD-10-CM | POA: Insufficient documentation

## 2023-01-27 DIAGNOSIS — R2681 Unsteadiness on feet: Secondary | ICD-10-CM | POA: Insufficient documentation

## 2023-01-27 NOTE — Therapy (Signed)
OUTPATIENT PHYSICAL THERAPY THORACOLUMBAR EVALUATION   Patient Name: Abigail Moss MRN: [id number] DOB:Jul 10, 1945, 77 y.o., female Today's Date: 01/27/2023  END OF SESSION:  PT End of Session - 01/27/23 1304     Visit Number 1    Number of Visits 8    Date for PT Re-Evaluation 02/27/23    PT Start Time 1305    PT Stop Time 1344    PT Time Calculation (min) 39 min    Activity Tolerance Patient tolerated treatment well    Behavior During Therapy WFL for tasks assessed/performed             Past Medical History:  Diagnosis Date   GERD (gastroesophageal reflux disease)    Hypertension    Osteopenia    Thyroid disease    Past Surgical History:  Procedure Laterality Date   EAR TUBE REMOVAL     ROTATOR CUFF REPAIR Left    TONSILLECTOMY     VEIN LIGATION AND STRIPPING     Patient Active Problem List   Diagnosis Date Noted   Slow transit constipation 02/04/2022   Lumbar radiculopathy 09/30/2021   Degeneration of lumbar intervertebral disc 04/20/2020   Benzodiazepine dependence (HCC) 07/15/2018   Controlled substance agreement signed 07/15/2018   GAD (generalized anxiety disorder) 12/18/2015   Overweight (BMI 25.0-29.9) 12/18/2015   Hypothyroidism 12/11/2014   Allergic rhinitis 12/11/2014   Vitamin D deficiency 12/11/2014   Osteoporosis 12/11/2014   HTN (hypertension) 12/08/2012   GERD (gastroesophageal reflux disease) 12/08/2012    PCP: Junie Spencer, FNP  REFERRING PROVIDER: Venita Lick, MD  REFERRING DIAG: Vertebrogenic low back pain   Rationale for Evaluation and Treatment: Rehabilitation  THERAPY DIAG:  Radiculopathy, lumbar region  Unsteadiness on feet  ONSET DATE: chronic pain  SUBJECTIVE:                                                                                                                                                                                           SUBJECTIVE STATEMENT: Patient reports that she has been experiencing  chronic back pain for a few years, but she had been having injections every year which had been helping. However, this most recent injection did not help. She had a MRI done and it was found that she had a ruptured disc as well. She also had a pain block which has helped some as she has been able to sleep better, but the pain still keeps her from doing things. She has pain radiating down her right leg, but it seems to act different everyday.   PERTINENT HISTORY:  Hypertension, osteoporosis, and anxiety  PAIN:  Are  you having pain? Yes: NPRS scale: 8/10 Pain location: low back, both hips, and right leg Pain description: chronic, but varies each day Aggravating factors: bending, walking, laundry, and household activities  Relieving factors: rest, medication, and heat  PRECAUTIONS: None  RED FLAGS: None   WEIGHT BEARING RESTRICTIONS: No  FALLS:  Has patient fallen in last 6 months?  No, but she feels unsteady when walking without an assistive device  LIVING ENVIRONMENT: Lives with: lives with their family Lives in: House/apartment Stairs: Yes: External: 3 steps; can reach both; step to pattern Has following equipment at home: Single point cane and Walker - 4 wheeled  OCCUPATION: retired  PLOF: Independent  PATIENT GOALS: be able to do her housework, care for her husband, and be able to go out shopping with her daughter   NEXT MD VISIT: none scheduled  OBJECTIVE:   SCREENING FOR RED FLAGS: Bowel or bladder incontinence: No Spinal tumors: No Cauda equina syndrome: No Compression fracture: No Abdominal aneurysm: No  COGNITION: Overall cognitive status: Within functional limits for tasks assessed     SENSATION: Light touch: WFL Patient reports numbness in her right foot.   PALPATION: TTP: bilateral lumbar paraspinals, QL, obliques, gluteals, piriformis, and proximal right hamstrings   LUMBAR JOINT MOBILITY:  Unable to be assessed due to tenderness to palpation with  increased tenderness at lower lumbar levels  LUMBAR ROM: required SBA for safety due to instability  AROM eval  Flexion   Extension   Right lateral flexion   Left lateral flexion   Right rotation 75% limited  Left rotation 50% limited   (Blank rows = not tested)  LOWER EXTREMITY ROM: WFL for activities assessed  LOWER EXTREMITY MMT:    MMT Right eval Left eval  Hip flexion 3/5 3+/5  Hip extension    Hip abduction    Hip adduction    Hip internal rotation    Hip external rotation    Knee flexion 4-/5 4/5  Knee extension 4-/5 4/5  Ankle dorsiflexion 3+/5 3+/5  Ankle plantarflexion    Ankle inversion    Ankle eversion     (Blank rows = not tested)  FUNCTIONAL TESTS:  5 times sit to stand: 27.97 seconds with upper extremity support  Timed up and go (TUG): 23.37 seconds with single point cane  GAIT: Assistive device utilized: Single point cane Level of assistance: Modified independence Comments: Decreased gait speed and stride length  TODAY'S TREATMENT:                                                                                                                              DATE:     PATIENT EDUCATION:  Education details: Plan of care, healing, anatomy, objective findings, and goals for therapy Person educated: Patient Education method: Explanation Education comprehension: verbalized understanding  HOME EXERCISE PROGRAM:   ASSESSMENT:  CLINICAL IMPRESSION: Patient is a 77 y.o. female who was seen today for physical therapy evaluation  and treatment for chronic low back pain with referred right lower extremity pain. She presented with high pain severity and irritability with palpation to her lumbar spine and the surrounding musculature reproducing her familiar symptoms. She is also at an elevated risk of falling as evidenced by her gait pattern and her objective measures. Recommend that she continue with skilled physical therapy to address her impairments to  maximize her safety and functional mobility.  OBJECTIVE IMPAIRMENTS: Abnormal gait, decreased activity tolerance, decreased balance, decreased mobility, difficulty walking, decreased ROM, decreased strength, hypomobility, impaired sensation, impaired tone, and pain.   ACTIVITY LIMITATIONS: carrying, lifting, bending, standing, stairs, transfers, and locomotion level  PARTICIPATION LIMITATIONS: meal prep, cleaning, laundry, driving, shopping, and community activity  PERSONAL FACTORS: Age, Past/current experiences, Time since onset of injury/illness/exacerbation, Transportation, and 3+ comorbidities: Hypertension, osteoporosis, and anxiety  are also affecting patient's functional outcome.   REHAB POTENTIAL: Fair    CLINICAL DECISION MAKING: Evolving/moderate complexity  EVALUATION COMPLEXITY: Moderate   GOALS: Goals reviewed with patient? Yes  LONG TERM GOALS: Target date: 02/24/23  Patient will be independent with her HEP. Baseline:  Goal status: INITIAL  2.  Patient will be able to complete her daily activities without her familiar pain exceeding 6/10. Baseline:  Goal status: INITIAL  3.  Patient will improve her 5 times sit to stand time to 20 seconds or less for improved lower extremity power. Baseline:  Goal status: INITIAL  4.  Patient will improve her timed up and go time to 15 seconds or less for improved functional mobility. Baseline:  Goal status: INITIAL  5.  Patient will be able to navigate at least 3 steps with a reciprocal pattern for improved household mobility. Baseline:  Goal status: INITIAL  PLAN:  PT FREQUENCY: 2x/week  PT DURATION: 4 weeks  PLANNED INTERVENTIONS: Therapeutic exercises, Therapeutic activity, Neuromuscular re-education, Balance training, Gait training, Patient/Family education, Self Care, Joint mobilization, Stair training, Electrical stimulation, Spinal mobilization, Cryotherapy, Moist heat, Traction, Manual therapy, and  Re-evaluation.  PLAN FOR NEXT SESSION: NuStep, lower extremity strengthening, balance interventions, manual therapy, and modalities as needed   Granville Lewis, PT 01/27/2023, 6:25 PM

## 2023-01-29 ENCOUNTER — Other Ambulatory Visit: Payer: Self-pay | Admitting: Family

## 2023-01-29 ENCOUNTER — Encounter: Payer: Medicare Other | Admitting: Physical Therapy

## 2023-02-03 ENCOUNTER — Ambulatory Visit: Payer: Medicare Other | Admitting: Physical Therapy

## 2023-02-03 ENCOUNTER — Encounter: Payer: Self-pay | Admitting: Physical Therapy

## 2023-02-03 DIAGNOSIS — M5416 Radiculopathy, lumbar region: Secondary | ICD-10-CM | POA: Diagnosis not present

## 2023-02-03 DIAGNOSIS — R2681 Unsteadiness on feet: Secondary | ICD-10-CM

## 2023-02-03 NOTE — Therapy (Signed)
OUTPATIENT PHYSICAL THERAPY THORACOLUMBAR TREATMENT   Patient Name: Abigail Moss MRN: [id number] DOB:10/02/1945, 77 y.o., female Today's Date: 02/03/2023  END OF SESSION:  PT End of Session - 02/03/23 1100     Visit Number 2    Number of Visits 8    Date for PT Re-Evaluation 02/27/23    PT Start Time 1105    PT Stop Time 1144    PT Time Calculation (min) 39 min    Activity Tolerance Patient tolerated treatment well    Behavior During Therapy WFL for tasks assessed/performed            Past Medical History:  Diagnosis Date   GERD (gastroesophageal reflux disease)    Hypertension    Osteopenia    Thyroid disease    Past Surgical History:  Procedure Laterality Date   EAR TUBE REMOVAL     ROTATOR CUFF REPAIR Left    TONSILLECTOMY     VEIN LIGATION AND STRIPPING     Patient Active Problem List   Diagnosis Date Noted   Slow transit constipation 02/04/2022   Lumbar radiculopathy 09/30/2021   Degeneration of lumbar intervertebral disc 04/20/2020   Benzodiazepine dependence (HCC) 07/15/2018   Controlled substance agreement signed 07/15/2018   GAD (generalized anxiety disorder) 12/18/2015   Overweight (BMI 25.0-29.9) 12/18/2015   Hypothyroidism 12/11/2014   Allergic rhinitis 12/11/2014   Vitamin D deficiency 12/11/2014   Osteoporosis 12/11/2014   HTN (hypertension) 12/08/2012   GERD (gastroesophageal reflux disease) 12/08/2012   PCP: Junie Spencer, FNP  REFERRING PROVIDER: Venita Lick, MD  REFERRING DIAG: Vertebrogenic low back pain   Rationale for Evaluation and Treatment: Rehabilitation  THERAPY DIAG:  Radiculopathy, lumbar region  Unsteadiness on feet  ONSET DATE: chronic pain  SUBJECTIVE:                                                                                                                                                                                           SUBJECTIVE STATEMENT: Has taken pain meds this morning. Prolonged standing  causes increase in pain. Lying supine, sitting, or meds help with pain and uses heating pad as necessary. Likes to be active.  PERTINENT HISTORY:  Hypertension, osteoporosis, and anxiety  PAIN:  Are you having pain? Yes: NPRS scale: 6/10 Pain location: low back, both hips, and right leg Pain description: chronic, but varies each day Aggravating factors: bending, walking, laundry, and household activities  Relieving factors: rest, medication, and heat  PRECAUTIONS: None  RED FLAGS: None   WEIGHT BEARING RESTRICTIONS: No  FALLS:  Has patient fallen in last 6 months?  No, but she feels  unsteady when walking without an assistive device  PATIENT GOALS: be able to do her housework, care for her husband, and be able to go out shopping with her daughter   NEXT MD VISIT: none scheduled  OBJECTIVE:    SENSATION: Light touch: WFL Patient reports numbness in her right foot.   PALPATION: TTP: bilateral lumbar paraspinals, QL, obliques, gluteals, piriformis, and proximal right hamstrings   LUMBAR JOINT MOBILITY:  Unable to be assessed due to tenderness to palpation with increased tenderness at lower lumbar levels  LUMBAR ROM: required SBA for safety due to instability  AROM eval  Flexion   Extension   Right lateral flexion   Left lateral flexion   Right rotation 75% limited  Left rotation 50% limited   (Blank rows = not tested)  LOWER EXTREMITY ROM: WFL for activities assessed  LOWER EXTREMITY MMT:    MMT Right eval Left eval  Hip flexion 3/5 3+/5  Hip extension    Hip abduction    Hip adduction    Hip internal rotation    Hip external rotation    Knee flexion 4-/5 4/5  Knee extension 4-/5 4/5  Ankle dorsiflexion 3+/5 3+/5  Ankle plantarflexion    Ankle inversion    Ankle eversion     (Blank rows = not tested)  FUNCTIONAL TESTS:  5 times sit to stand: 27.97 seconds with upper extremity support  Timed up and go (TUG): 23.37 seconds with single point  cane  GAIT: Assistive device utilized: Single point cane Level of assistance: Modified independence Comments: Decreased gait speed and stride length  TODAY'S TREATMENT:                                                                                                                              DATE:  02/03/23  EXERCISE LOG  Exercise Repetitions and Resistance Comments  Nustep L3 x10 min   LAQ 3# x20 reps, posture VC   Core press with ball Sitting; x20 reps 5 sec holds   Seated clam Red theraband x20 reps   Horizontal abduction Red theraband x15 reps Limited reps due to PMH of L RCR   Blank cell = exercise not performed today   Modalities  Date: 02/03/23 Unattended Estim: Lumbar, Pre-Mod, 10 mins, Pain Hot Pack: Lumbar, 10 mins, Pain  PATIENT EDUCATION:  Education details: HEP, posture focus for HEP Person educated: Patient Education method: Explanation and Handouts Education comprehension: verbalized understanding  HOME EXERCISE PROGRAM:  [id number] (02/03/23)  ASSESSMENT:  CLINICAL IMPRESSION: Patient presented in clinic with reports of moderate LBP and radicular pain. Patient rests and uses pain medication and heating pad as necessary. Patient progressed through light therex for LE strengthening and postural cues for proper sitting posture. Patient reported some fatigue with therex and Nustep especially. Patient states that LBP has greatly limited her activity levels. Normal modalities response noted following removal of the modalities. Patient provided handout for new HEP and TENS unit  as well. Patient verbalized understanding of HEP education.  OBJECTIVE IMPAIRMENTS: Abnormal gait, decreased activity tolerance, decreased balance, decreased mobility, difficulty walking, decreased ROM, decreased strength, hypomobility, impaired sensation, impaired tone, and pain.   ACTIVITY LIMITATIONS: carrying, lifting, bending, standing, stairs, transfers, and locomotion level  PARTICIPATION  LIMITATIONS: meal prep, cleaning, laundry, driving, shopping, and community activity  PERSONAL FACTORS: Age, Past/current experiences, Time since onset of injury/illness/exacerbation, Transportation, and 3+ comorbidities: Hypertension, osteoporosis, and anxiety  are also affecting patient's functional outcome.   REHAB POTENTIAL: Fair    CLINICAL DECISION MAKING: Evolving/moderate complexity  EVALUATION COMPLEXITY: Moderate  GOALS: Goals reviewed with patient? Yes  LONG TERM GOALS: Target date: 02/24/23  Patient will be independent with her HEP. Baseline:  Goal status: INITIAL  2.  Patient will be able to complete her daily activities without her familiar pain exceeding 6/10. Baseline:  Goal status: INITIAL  3.  Patient will improve her 5 times sit to stand time to 20 seconds or less for improved lower extremity power. Baseline:  Goal status: INITIAL  4.  Patient will improve her timed up and go time to 15 seconds or less for improved functional mobility. Baseline:  Goal status: INITIAL  5.  Patient will be able to navigate at least 3 steps with a reciprocal pattern for improved household mobility. Baseline:  Goal status: INITIAL  PLAN:  PT FREQUENCY: 2x/week  PT DURATION: 4 weeks  PLANNED INTERVENTIONS: Therapeutic exercises, Therapeutic activity, Neuromuscular re-education, Balance training, Gait training, Patient/Family education, Self Care, Joint mobilization, Stair training, Electrical stimulation, Spinal mobilization, Cryotherapy, Moist heat, Traction, Manual therapy, and Re-evaluation.  PLAN FOR NEXT SESSION: NuStep, lower extremity strengthening, balance interventions, manual therapy, and modalities as needed  Marvell Fuller, PTA 02/03/2023, 11:53 AM

## 2023-02-05 ENCOUNTER — Encounter: Payer: Self-pay | Admitting: Family

## 2023-02-05 ENCOUNTER — Ambulatory Visit: Payer: Medicare Other | Attending: Orthopedic Surgery | Admitting: Physical Therapy

## 2023-02-05 ENCOUNTER — Encounter: Payer: Self-pay | Admitting: Physical Therapy

## 2023-02-05 ENCOUNTER — Ambulatory Visit (INDEPENDENT_AMBULATORY_CARE_PROVIDER_SITE_OTHER): Payer: Medicare Other | Admitting: Family

## 2023-02-05 VITALS — BP 124/86 | HR 74 | Temp 97.5°F | Ht 66.0 in | Wt 171.0 lb

## 2023-02-05 DIAGNOSIS — Z0000 Encounter for general adult medical examination without abnormal findings: Secondary | ICD-10-CM | POA: Diagnosis not present

## 2023-02-05 DIAGNOSIS — M51369 Other intervertebral disc degeneration, lumbar region without mention of lumbar back pain or lower extremity pain: Secondary | ICD-10-CM

## 2023-02-05 DIAGNOSIS — Z0001 Encounter for general adult medical examination with abnormal findings: Secondary | ICD-10-CM

## 2023-02-05 DIAGNOSIS — Z79891 Long term (current) use of opiate analgesic: Secondary | ICD-10-CM | POA: Diagnosis not present

## 2023-02-05 DIAGNOSIS — F1320 Sedative, hypnotic or anxiolytic dependence, uncomplicated: Secondary | ICD-10-CM | POA: Diagnosis not present

## 2023-02-05 DIAGNOSIS — R2681 Unsteadiness on feet: Secondary | ICD-10-CM | POA: Insufficient documentation

## 2023-02-05 DIAGNOSIS — M5136 Other intervertebral disc degeneration, lumbar region: Secondary | ICD-10-CM | POA: Diagnosis not present

## 2023-02-05 DIAGNOSIS — Z79899 Other long term (current) drug therapy: Secondary | ICD-10-CM

## 2023-02-05 DIAGNOSIS — I10 Essential (primary) hypertension: Secondary | ICD-10-CM | POA: Diagnosis not present

## 2023-02-05 DIAGNOSIS — K219 Gastro-esophageal reflux disease without esophagitis: Secondary | ICD-10-CM | POA: Diagnosis not present

## 2023-02-05 DIAGNOSIS — E559 Vitamin D deficiency, unspecified: Secondary | ICD-10-CM

## 2023-02-05 DIAGNOSIS — K5901 Slow transit constipation: Secondary | ICD-10-CM

## 2023-02-05 DIAGNOSIS — M5416 Radiculopathy, lumbar region: Secondary | ICD-10-CM

## 2023-02-05 DIAGNOSIS — E663 Overweight: Secondary | ICD-10-CM

## 2023-02-05 DIAGNOSIS — F411 Generalized anxiety disorder: Secondary | ICD-10-CM | POA: Diagnosis not present

## 2023-02-05 DIAGNOSIS — Z23 Encounter for immunization: Secondary | ICD-10-CM | POA: Diagnosis not present

## 2023-02-05 DIAGNOSIS — M810 Age-related osteoporosis without current pathological fracture: Secondary | ICD-10-CM | POA: Diagnosis not present

## 2023-02-05 DIAGNOSIS — E039 Hypothyroidism, unspecified: Secondary | ICD-10-CM

## 2023-02-05 MED ORDER — ALPRAZOLAM 0.5 MG PO TABS: 0.5000 mg | ORAL_TABLET | Freq: Every evening | ORAL | 3 refills | Status: DC | PRN

## 2023-02-05 NOTE — Therapy (Signed)
OUTPATIENT PHYSICAL THERAPY THORACOLUMBAR TREATMENT   Patient Name: Abigail Moss MRN: [id number] DOB:1946/05/28, 77 y.o., female Today's Date: 02/05/2023  END OF SESSION:  PT End of Session - 02/05/23 1105     Visit Number 3    Number of Visits 8    Date for PT Re-Evaluation 02/27/23    PT Start Time 1103    PT Stop Time 1152    PT Time Calculation (min) 49 min    Activity Tolerance Patient tolerated treatment well    Behavior During Therapy WFL for tasks assessed/performed            Past Medical History:  Diagnosis Date   GERD (gastroesophageal reflux disease)    Hypertension    Osteopenia    Thyroid disease    Past Surgical History:  Procedure Laterality Date   EAR TUBE REMOVAL     ROTATOR CUFF REPAIR Left    TONSILLECTOMY     VEIN LIGATION AND STRIPPING     Patient Active Problem List   Diagnosis Date Noted   Slow transit constipation 02/04/2022   Lumbar radiculopathy 09/30/2021   Degeneration of lumbar intervertebral disc 04/20/2020   Benzodiazepine dependence (HCC) 07/15/2018   Controlled substance agreement signed 07/15/2018   GAD (generalized anxiety disorder) 12/18/2015   Overweight (BMI 25.0-29.9) 12/18/2015   Hypothyroidism 12/11/2014   Allergic rhinitis 12/11/2014   Vitamin D deficiency 12/11/2014   Osteoporosis 12/11/2014   HTN (hypertension) 12/08/2012   GERD (gastroesophageal reflux disease) 12/08/2012   PCP: Junie Spencer, FNP  REFERRING PROVIDER: Venita Lick, MD  REFERRING DIAG: Vertebrogenic low back pain   Rationale for Evaluation and Treatment: Rehabilitation  THERAPY DIAG:  Radiculopathy, lumbar region  Unsteadiness on feet  ONSET DATE: chronic pain  SUBJECTIVE:                                                                                                                                                                                           SUBJECTIVE STATEMENT: Reports having greater pain last night after household  ADLs. Patient reports that she feels fairly good today after resting last night and pain meds.   PERTINENT HISTORY:  Hypertension, osteoporosis, and anxiety  PAIN:  Are you having pain? Yes: NPRS scale: 4/10 Pain location: low back, both hips, and right leg Pain description: chronic, but varies each day Aggravating factors: bending, walking, laundry, and household activities  Relieving factors: rest, medication, and heat  PRECAUTIONS: None  WEIGHT BEARING RESTRICTIONS: No  FALLS:  Has patient fallen in last 6 months?  No, but she feels unsteady when walking without an assistive device  PATIENT GOALS:  be able to do her housework, care for her husband, and be able to go out shopping with her daughter   NEXT MD VISIT: none scheduled  OBJECTIVE:    SENSATION: Light touch: WFL Patient reports numbness in her right foot.   PALPATION: TTP: bilateral lumbar paraspinals, QL, obliques, gluteals, piriformis, and proximal right hamstrings   LUMBAR JOINT MOBILITY:  Unable to be assessed due to tenderness to palpation with increased tenderness at lower lumbar levels  LUMBAR ROM: required SBA for safety due to instability  AROM eval  Flexion   Extension   Right lateral flexion   Left lateral flexion   Right rotation 75% limited  Left rotation 50% limited   (Blank rows = not tested)  LOWER EXTREMITY ROM: WFL for activities assessed  LOWER EXTREMITY MMT:    MMT Right eval Left eval  Hip flexion 3/5 3+/5  Hip extension    Hip abduction    Hip adduction    Hip internal rotation    Hip external rotation    Knee flexion 4-/5 4/5  Knee extension 4-/5 4/5  Ankle dorsiflexion 3+/5 3+/5  Ankle plantarflexion    Ankle inversion    Ankle eversion     (Blank rows = not tested)  FUNCTIONAL TESTS:  5 times sit to stand: 27.97 seconds with upper extremity support  Timed up and go (TUG): 23.37 seconds with single point cane  GAIT: Assistive device utilized: Single point  cane Level of assistance: Modified independence Comments: Decreased gait speed and stride length  TODAY'S TREATMENT:                                                                                                                              DATE:  02/05/23  EXERCISE LOG  Exercise Repetitions and Resistance Comments  Nustep L2 x12 min   LAQ 3# x20 reps, posture VC   Core press with ball Sitting; x20 reps 5 sec holds   Seated clam Red theraband x20 reps   Horizontal abduction Red theraband x15 reps Limited reps due to PMH of L RCR  Seated row  Red theraband x15 reps Reports R shoulder soreness   Blank cell = exercise not performed today   Modalities  Date: 02/05/23 Unattended Estim: Lumbar, Pre-Mod, 10 mins, Pain Hot Pack: Lumbar, 10 mins, Pain  PATIENT EDUCATION:  Education details: HEP, posture focus for HEP Person educated: Patient Education method: Explanation and Handouts Education comprehension: verbalized understanding  HOME EXERCISE PROGRAM:  [id number] (02/03/23)  ASSESSMENT:  CLINICAL IMPRESSION: Patient presented in clinic with reports of mod LBP and soreness in LE. Patient reported compliance of HEP. Patient able to tolerate therex well but able to self correct sitting posture. Patient eager to help improve LBP and avoid surgery. Normal modalities response noted following removal of the modalities.  OBJECTIVE IMPAIRMENTS: Abnormal gait, decreased activity tolerance, decreased balance, decreased mobility, difficulty walking, decreased ROM, decreased strength, hypomobility, impaired sensation, impaired tone, and  pain.   ACTIVITY LIMITATIONS: carrying, lifting, bending, standing, stairs, transfers, and locomotion level  PARTICIPATION LIMITATIONS: meal prep, cleaning, laundry, driving, shopping, and community activity  PERSONAL FACTORS: Age, Past/current experiences, Time since onset of injury/illness/exacerbation, Transportation, and 3+ comorbidities: Hypertension,  osteoporosis, and anxiety  are also affecting patient's functional outcome.   REHAB POTENTIAL: Fair    CLINICAL DECISION MAKING: Evolving/moderate complexity  EVALUATION COMPLEXITY: Moderate  GOALS: Goals reviewed with patient? Yes  LONG TERM GOALS: Target date: 02/24/23  Patient will be independent with her HEP. Baseline:  Goal status: INITIAL  2.  Patient will be able to complete her daily activities without her familiar pain exceeding 6/10. Baseline:  Goal status: INITIAL  3.  Patient will improve her 5 times sit to stand time to 20 seconds or less for improved lower extremity power. Baseline:  Goal status: INITIAL  4.  Patient will improve her timed up and go time to 15 seconds or less for improved functional mobility. Baseline:  Goal status: INITIAL  5.  Patient will be able to navigate at least 3 steps with a reciprocal pattern for improved household mobility. Baseline:  Goal status: INITIAL  PLAN:  PT FREQUENCY: 2x/week  PT DURATION: 4 weeks  PLANNED INTERVENTIONS: Therapeutic exercises, Therapeutic activity, Neuromuscular re-education, Balance training, Gait training, Patient/Family education, Self Care, Joint mobilization, Stair training, Electrical stimulation, Spinal mobilization, Cryotherapy, Moist heat, Traction, Manual therapy, and Re-evaluation.  PLAN FOR NEXT SESSION: NuStep, lower extremity strengthening, balance interventions, manual therapy, and modalities as needed  Marvell Fuller, PTA 02/05/2023, 12:03 PM

## 2023-02-05 NOTE — Patient Instructions (Signed)
Health Maintenance After Age 79 After age 79, you are at a higher risk for certain long-term diseases and infections as well as injuries from falls. Falls are a major cause of broken bones and head injuries in people who are older than age 79. Getting regular preventive care can help to keep you healthy and well. Preventive care includes getting regular testing and making lifestyle changes as recommended by your health care provider. Talk with your health care provider about: Which screenings and tests you should have. A screening is a test that checks for a disease when you have no symptoms. A diet and exercise plan that is right for you. What should I know about screenings and tests to prevent falls? Screening and testing are the best ways to find a health problem early. Early diagnosis and treatment give you the best chance of managing medical conditions that are common after age 79. Certain conditions and lifestyle choices may make you more likely to have a fall. Your health care provider may recommend: Regular vision checks. Poor vision and conditions such as cataracts can make you more likely to have a fall. If you wear glasses, make sure to get your prescription updated if your vision changes. Medicine review. Work with your health care provider to regularly review all of the medicines you are taking, including over-the-counter medicines. Ask your health care provider about any side effects that may make you more likely to have a fall. Tell your health care provider if any medicines that you take make you feel dizzy or sleepy. Strength and balance checks. Your health care provider may recommend certain tests to check your strength and balance while standing, walking, or changing positions. Foot health exam. Foot pain and numbness, as well as not wearing proper footwear, can make you more likely to have a fall. Screenings, including: Osteoporosis screening. Osteoporosis is a condition that causes  the bones to get weaker and break more easily. Blood pressure screening. Blood pressure changes and medicines to control blood pressure can make you feel dizzy. Depression screening. You may be more likely to have a fall if you have a fear of falling, feel depressed, or feel unable to do activities that you used to do. Alcohol use screening. Using too much alcohol can affect your balance and may make you more likely to have a fall. Follow these instructions at home: Lifestyle Do not drink alcohol if: Your health care provider tells you not to drink. If you drink alcohol: Limit how much you have to: 0-1 drink a day for women. 0-2 drinks a day for men. Know how much alcohol is in your drink. In the [place] one drink equals one 12 oz bottle of beer (355 mL), one 5 oz glass of wine (148 mL), or one 1 oz glass of hard liquor (44 mL). Do not use any products that contain nicotine or tobacco. These products include cigarettes, chewing tobacco, and vaping devices, such as e-cigarettes. If you need help quitting, ask your health care provider. Activity  Follow a regular exercise program to stay fit. This will help you maintain your balance. Ask your health care provider what types of exercise are appropriate for you. If you need a cane or walker, use it as recommended by your health care provider. Wear supportive shoes that have nonskid soles. Safety  Remove any tripping hazards, such as rugs, cords, and clutter. Install safety equipment such as grab bars in bathrooms and safety rails on stairs. Keep rooms and walkways   well-lit. General instructions Talk with your health care provider about your risks for falling. Tell your health care provider if: You fall. Be sure to tell your health care provider about all falls, even ones that seem minor. You feel dizzy, tiredness (fatigue), or off-balance. Take over-the-counter and prescription medicines only as told by your health care provider. These include  supplements. Eat a healthy diet and maintain a healthy weight. A healthy diet includes low-fat dairy products, low-fat (lean) meats, and fiber from whole grains, beans, and lots of fruits and vegetables. Stay current with your vaccines. Schedule regular health, dental, and eye exams. Summary Having a healthy lifestyle and getting preventive care can help to protect your health and wellness after age 79. Screening and testing are the best way to find a health problem early and help you avoid having a fall. Early diagnosis and treatment give you the best chance for managing medical conditions that are more common for people who are older than age 79. Falls are a major cause of broken bones and head injuries in people who are older than age 79. Take precautions to prevent a fall at home. Work with your health care provider to learn what changes you can make to improve your health and wellness and to prevent falls. This information is not intended to replace advice given to you by your health care provider. Make sure you discuss any questions you have with your health care provider. Document Revised: 11/12/2020 Document Reviewed: 11/12/2020 [employer] Patient Education  2024 [employer]  

## 2023-02-05 NOTE — Progress Notes (Signed)
Subjective:    Patient ID: Abigail Moss, female    DOB: 03-16-1946, 77 y.o.   MRN: [id number]  Chief Complaint  Patient presents with   Medical Management of Chronic Issues   Pt presents to the office today for CPE and chronic follow up. She is drinking Ensure as needed since COVID she has not been able to eat meats.     She has osteoporosis and takes Fosamax.  Last Dexa scan 04/11/20.   She fell and broke her right wrist on 05/03/22. Has chronic back pain and Followed by Ortho and PT.   Hypertension This is a chronic problem. The current episode started more than 1 year ago. The problem has been resolved since onset. The problem is controlled. Associated symptoms include anxiety and malaise/fatigue. Pertinent negatives include no peripheral edema or shortness of breath. Risk factors for coronary artery disease include dyslipidemia, obesity and sedentary lifestyle. The current treatment provides moderate improvement. Identifiable causes of hypertension include a thyroid problem.  Constipation This is a chronic problem. The current episode started more than 1 year ago. The problem has been resolved since onset. Her stool frequency is 1 time per day. Associated symptoms include back pain. She has tried laxatives for the symptoms. The treatment provided moderate relief.  Gastroesophageal Reflux She complains of belching and heartburn. This is a chronic problem. The current episode started more than 1 year ago. The problem occurs occasionally. Associated symptoms include fatigue. Risk factors include obesity. She has tried a PPI for the symptoms. The treatment provided moderate relief.  Thyroid Problem Presents for follow-up visit. Symptoms include anxiety, constipation and fatigue. Patient reports no dry skin. The symptoms have been stable.  Back Pain This is a chronic problem. The current episode started more than 1 year ago. The problem occurs intermittently. The pain is present in the lumbar  spine. The pain is at a severity of 8/10. The pain is moderate. Risk factors include obesity. She has tried home exercises and NSAIDs for the symptoms. The treatment provided mild relief.  Anxiety Presents for follow-up visit. Symptoms include excessive worry, nervous/anxious behavior and obsessions. Patient reports no shortness of breath. Symptoms occur occasionally. The severity of symptoms is moderate.        Review of Systems  Constitutional:  Positive for fatigue and malaise/fatigue.  Respiratory:  Negative for shortness of breath.   Gastrointestinal:  Positive for constipation and heartburn.  Musculoskeletal:  Positive for back pain.  Psychiatric/Behavioral:  The patient is nervous/anxious.   All other systems reviewed and are negative.  Family History  Problem Relation Age of Onset   Heart disease Mother 56       CHF   Diabetes Father    Heart disease Sister        CHF   Cancer Sister        throat   Cancer Brother    Stroke Brother    COPD Brother    Breast cancer Neg Hx    Social History   Socioeconomic History   Marital status: Married    Spouse name: [name]   Number of children: 2   Years of education: 12   Highest education level: High school graduate  Occupational History   Occupation: retired     Comment: worked at [employer]  Tobacco Use   Smoking status: Former    Current packs/day: 0.00    Types: Cigarettes    Quit date: 07/07/1996    Years since quitting: 26.6  Smokeless tobacco: Never  Vaping Use   Vaping status: Never Used  Substance and Sexual Activity   Alcohol use: No    Alcohol/week: 0.0 standard drinks of alcohol   Drug use: No   Sexual activity: Not Currently  Other Topics Concern   Not on file  Social History Narrative   Lives at home with husband.  Grandson stays with them a lot.   Children live nearby   Social Determinants of Health   Financial Resource Strain: Low Risk  (02/24/2022)   Overall Financial Resource Strain (CARDIA)     Difficulty of Paying Living Expenses: Not hard at all  Food Insecurity: No Food Insecurity (02/24/2022)   Hunger Vital Sign    Worried About Running Out of Food in the Last Year: Never true    Ran Out of Food in the Last Year: Never true  Transportation Needs: No Transportation Needs (02/24/2022)   PRAPARE - Administrator, Civil Service (Medical): No    Lack of Transportation (Non-Medical): No  Physical Activity: Sufficiently Active (02/24/2022)   Exercise Vital Sign    Days of Exercise per Week: 7 days    Minutes of Exercise per Session: 30 min  Stress: No Stress Concern Present (02/24/2022)   [employer] of Occupational Health - Occupational Stress Questionnaire    Feeling of Stress : Only a little  Social Connections: Moderately Isolated (02/24/2022)   Social Connection and Isolation Panel [NHANES]    Frequency of Communication with Friends and Family: More than three times a week    Frequency of Social Gatherings with Friends and Family: More than three times a week    Attends Religious Services: Never    Database administrator or Organizations: No    Attends Banker Meetings: Never    Marital Status: Married        Objective:   Physical Exam Vitals reviewed.  Constitutional:      General: She is not in acute distress.    Appearance: She is well-developed.  HENT:     Head: Normocephalic and atraumatic.     Right Ear: Tympanic membrane normal.     Left Ear: Tympanic membrane normal.  Eyes:     Pupils: Pupils are equal, round, and reactive to light.  Neck:     Thyroid: No thyromegaly.  Cardiovascular:     Rate and Rhythm: Normal rate and regular rhythm.     Heart sounds: Normal heart sounds. No murmur heard. Pulmonary:     Effort: Pulmonary effort is normal. No respiratory distress.     Breath sounds: Normal breath sounds. No wheezing.  Abdominal:     General: Bowel sounds are normal. There is no distension.     Palpations: Abdomen is  soft.     Tenderness: There is no abdominal tenderness.  Musculoskeletal:        General: No tenderness. Normal range of motion.     Cervical back: Normal range of motion and neck supple.  Skin:    General: Skin is warm and dry.  Neurological:     Mental Status: She is alert and oriented to person, place, and time.     Cranial Nerves: No cranial nerve deficit.     Deep Tendon Reflexes: Reflexes are normal and symmetric.  Psychiatric:        Behavior: Behavior normal.        Thought Content: Thought content normal.        Judgment: Judgment normal.  BP 124/86   Pulse 74   Temp (!) 97.5 F (36.4 C) (Temporal)   Ht 5\' 6"  (1.676 m)   Wt 171 lb (77.6 kg)   SpO2 95%   BMI 27.60 kg/m      Assessment & Plan:  TIMMY DELAHOUSSAYE comes in today with chief complaint of Medical Management of Chronic Issues   Diagnosis and orders addressed:  1. GAD (generalized anxiety disorder) - ALPRAZolam (XANAX) 0.5 MG tablet; Take 1 tablet (0.5 mg total) by mouth at bedtime as needed.  Dispense: 30 tablet; Refill: 3 - CBC with Differential/Platelet - CMP14+EGFR  2. Benzodiazepine dependence (HCC) - ALPRAZolam (XANAX) 0.5 MG tablet; Take 1 tablet (0.5 mg total) by mouth at bedtime as needed.  Dispense: 30 tablet; Refill: 3 - CBC with Differential/Platelet - CMP14+EGFR  3. Controlled substance agreement signed - ALPRAZolam (XANAX) 0.5 MG tablet; Take 1 tablet (0.5 mg total) by mouth at bedtime as needed.  Dispense: 30 tablet; Refill: 3 - CBC with Differential/Platelet - CMP14+EGFR  4. Need for shingles vaccine - Zoster, Recombinant (Shingrix) - CBC with Differential/Platelet - CMP14+EGFR  5. Annual physical exam - ToxASSURE Select 13 (MW), Urine - CBC with Differential/Platelet - CMP14+EGFR - Lipid panel - TSH  6. Vitamin D deficiency - CBC with Differential/Platelet - CMP14+EGFR  7. Slow transit constipation - CBC with Differential/Platelet - CMP14+EGFR  8. Overweight  (BMI 25.0-29.9) - CBC with Differential/Platelet - CMP14+EGFR  9. Osteoporosis, unspecified osteoporosis type, unspecified pathological fracture presence - CBC with Differential/Platelet - CMP14+EGFR  10. Lumbar radiculopathy - CBC with Differential/Platelet - CMP14+EGFR  11. Hypothyroidism, unspecified type - CBC with Differential/Platelet - CMP14+EGFR - TSH  12. Primary hypertension - CBC with Differential/Platelet - CMP14+EGFR  13. Gastroesophageal reflux disease, unspecified whether esophagitis present - CBC with Differential/Platelet - CMP14+EGFR  14. Degeneration of lumbar intervertebral disc - CBC with Differential/Platelet - CMP14+EGFR   Labs pending Patient reviewed in [place] controlled database, no flags noted. Contract and drug screen up dated today.  Health Maintenance reviewed Diet and exercise encouraged  Follow up plan: 3 months    Jannifer Rodney, FNP

## 2023-02-06 ENCOUNTER — Other Ambulatory Visit: Payer: Self-pay | Admitting: Family

## 2023-02-06 MED ORDER — ROSUVASTATIN CALCIUM 5 MG PO TABS: 5.0000 mg | ORAL_TABLET | Freq: Every day | ORAL | 3 refills | Status: DC

## 2023-02-11 ENCOUNTER — Ambulatory Visit (INDEPENDENT_AMBULATORY_CARE_PROVIDER_SITE_OTHER): Payer: Medicare Other

## 2023-02-11 ENCOUNTER — Ambulatory Visit (INDEPENDENT_AMBULATORY_CARE_PROVIDER_SITE_OTHER): Payer: Medicare Other | Admitting: Nurse Practitioner

## 2023-02-11 ENCOUNTER — Encounter: Payer: Self-pay | Admitting: Nurse Practitioner

## 2023-02-11 VITALS — BP 141/86 | HR 70 | Temp 97.6°F | Resp 20 | Ht 66.0 in | Wt 171.0 lb

## 2023-02-11 DIAGNOSIS — R059 Cough, unspecified: Secondary | ICD-10-CM | POA: Diagnosis not present

## 2023-02-11 DIAGNOSIS — R051 Acute cough: Secondary | ICD-10-CM

## 2023-02-11 DIAGNOSIS — K449 Diaphragmatic hernia without obstruction or gangrene: Secondary | ICD-10-CM | POA: Diagnosis not present

## 2023-02-11 DIAGNOSIS — J40 Bronchitis, not specified as acute or chronic: Secondary | ICD-10-CM | POA: Diagnosis not present

## 2023-02-11 DIAGNOSIS — R0981 Nasal congestion: Secondary | ICD-10-CM | POA: Diagnosis not present

## 2023-02-11 LAB — RSV AG, IMMUNOCHR, WAIVED: RSV Ag, Immunochr, Waived: NEGATIVE

## 2023-02-11 MED ORDER — AZITHROMYCIN 250 MG PO TABS
ORAL_TABLET | ORAL | 0 refills | Status: DC
Start: 2023-02-11 — End: 2023-04-27

## 2023-02-11 MED ORDER — PREDNISONE 20 MG PO TABS
40.0000 mg | ORAL_TABLET | Freq: Every day | ORAL | 0 refills | Status: AC
Start: 2023-02-11 — End: 2023-02-16

## 2023-02-11 NOTE — Patient Instructions (Signed)

## 2023-02-11 NOTE — Progress Notes (Signed)
Subjective:    Patient ID: Abigail Moss, female    DOB: [birth year], 77 y.o.   MRN: [id number]   Chief Complaint: cough  Cough This is a new problem. The current episode started in the past 7 days. The problem has been gradually worsening. The problem occurs every few hours. The cough is Productive of sputum. Associated symptoms include ear congestion, nasal congestion and rhinorrhea. Pertinent negatives include no fever or sore throat. Nothing aggravates the symptoms. She has tried OTC cough suppressant for the symptoms. The treatment provided mild relief.    Patient Active Problem List   Diagnosis Date Noted   Slow transit constipation 02/04/2022   Lumbar radiculopathy 09/30/2021   Degeneration of lumbar intervertebral disc 04/20/2020   Benzodiazepine dependence (HCC) 07/15/2018   Controlled substance agreement signed 07/15/2018   GAD (generalized anxiety disorder) 12/18/2015   Overweight (BMI 25.0-29.9) 12/18/2015   Hypothyroidism 12/11/2014   Allergic rhinitis 12/11/2014   Vitamin D deficiency 12/11/2014   Osteoporosis 12/11/2014   HTN (hypertension) 12/08/2012   GERD (gastroesophageal reflux disease) 12/08/2012       Review of Systems  Constitutional:  Negative for fever.  HENT:  Positive for rhinorrhea. Negative for sore throat.   Respiratory:  Positive for cough.        Objective:   Physical Exam HENT:     Right Ear: A PE tube is present.     Left Ear: A PE tube is present.     Nose: Congestion and rhinorrhea present.     Mouth/Throat:     Pharynx: No oropharyngeal exudate or posterior oropharyngeal erythema.  Cardiovascular:     Rate and Rhythm: Normal rate and regular rhythm.     Heart sounds: Normal heart sounds.  Pulmonary:     Effort: Pulmonary effort is normal.     Comments: Diminished breath sounds right lower lobe Musculoskeletal:     Cervical back: Normal range of motion and neck supple.  Skin:    General: Skin is warm.  Neurological:      General: No focal deficit present.     Mental Status: She is oriented to person, place, and time.  Psychiatric:        Mood and Affect: Mood normal.        Behavior: Behavior normal.     BP (!) 141/86   Pulse 70   Temp 97.6 F (36.4 C) (Temporal)   Resp 20   Ht 5\' 6"  (1.676 m)   Wt 171 lb (77.6 kg)   SpO2 94%   BMI 27.60 kg/m   Chest xray- right hemidiaphragm-Preliminary reading by Paulene Floor, FNP  [facility]  (-) RSV    Assessment & Plan:   [name] in today with chief complaint of Cough and Nasal Congestion   1. Nasal congestion - RSV Ag, Immunochr, Waived - Novel Coronavirus, NAA ([facility])  2. Acute cough - DG Chest 2 View  3. Bronchitis Focre fluids OTC cough meds as needed - azithromycin (ZITHROMAX Z-PAK) 250 MG tablet; As directed  Dispense: 6 tablet; Refill: 0 - predniSONE (DELTASONE) 20 MG tablet; Take 2 tablets (40 mg total) by mouth daily with breakfast for 5 days. 2 po daily for 5 days  Dispense: 10 tablet; Refill: 0    The above assessment and management plan was discussed with the patient. The patient verbalized understanding of and has agreed to the management plan. Patient is aware to call the clinic if symptoms persist or worsen. Patient is aware  when to return to the clinic for a follow-up visit. Patient educated on when it is appropriate to go to the emergency department.   Mary-Margaret Daphine Deutscher, FNP

## 2023-02-12 ENCOUNTER — Encounter: Payer: Medicare Other | Admitting: Physical Therapy

## 2023-02-15 ENCOUNTER — Other Ambulatory Visit: Payer: Self-pay | Admitting: Family

## 2023-02-15 DIAGNOSIS — J301 Allergic rhinitis due to pollen: Secondary | ICD-10-CM

## 2023-02-18 ENCOUNTER — Telehealth: Payer: Self-pay | Admitting: Family

## 2023-02-18 NOTE — Telephone Encounter (Signed)
Patient was seen on 8/7 for a cough and said she is not any better. Wants to know if something else can be called in. [facility] [place] if [facility] can be called in. Stated that she also had a chest x ray on this day and wants to talk to nurse about it.

## 2023-02-18 NOTE — Telephone Encounter (Signed)
No answer, no machine.

## 2023-02-20 ENCOUNTER — Telehealth (INDEPENDENT_AMBULATORY_CARE_PROVIDER_SITE_OTHER): Payer: Medicare Other | Admitting: Nurse Practitioner

## 2023-02-20 ENCOUNTER — Telehealth: Payer: Self-pay | Admitting: Family

## 2023-02-20 ENCOUNTER — Encounter: Payer: Self-pay | Admitting: Nurse Practitioner

## 2023-02-20 DIAGNOSIS — J069 Acute upper respiratory infection, unspecified: Secondary | ICD-10-CM | POA: Diagnosis not present

## 2023-02-20 MED ORDER — NIRMATRELVIR/RITONAVIR (PAXLOVID)TABLET: 3.0000 | ORAL_TABLET | Freq: Two times a day (BID) | ORAL | 0 refills | Status: AC

## 2023-02-20 MED ORDER — PREDNISONE 20 MG PO TABS: 40.0000 mg | ORAL_TABLET | Freq: Every day | ORAL | 0 refills | Status: AC

## 2023-02-20 NOTE — Telephone Encounter (Signed)
If has been more then 4 days- to late for antiviral and will just have  to do symptomatic treatment

## 2023-02-20 NOTE — Progress Notes (Addendum)
Virtual Visit Consent   Abigail Moss, you are scheduled for a virtual visit with Mary-Margaret Daphine Deutscher, FNP, a [facility] provider, today.     Just as with appointments in the office, your consent must be obtained to participate.  Your consent will be active for this visit and any virtual visit you may have with one of our providers in the next 365 days.     If you have a MyChart account, a copy of this consent can be sent to you electronically.  All virtual visits are billed to your insurance company just like a traditional visit in the office.    As this is a virtual visit, video technology does not allow for your provider to perform a traditional examination.  This may limit your provider's ability to fully assess your condition.  If your provider identifies any concerns that need to be evaluated in person or the need to arrange testing (such as labs, EKG, etc.), we will make arrangements to do so.     Although advances in technology are sophisticated, we cannot ensure that it will always work on either your end or our end.  If the connection with a video visit is poor, the visit may have to be switched to a telephone visit.  With either a video or telephone visit, we are not always able to ensure that we have a secure connection.     I need to obtain your verbal consent now.   Are you willing to proceed with your visit today? YES   Abigail Moss has provided verbal consent on 02/20/2023 for a virtual visit (video or telephone).   Mary-Margaret Daphine Deutscher, FNP   Date: 02/20/2023 11:02 AM   Virtual Visit via Video Note   I, Mary-Margaret Daphine Deutscher, connected with Abigail Moss ([id number], 1946/03/20) on 02/20/23 at 11:20 AM EDT by a video-enabled telemedicine application and verified that I am speaking with the correct person using two identifiers.  Location: Patient: Virtual Visit Location Patient: Home Provider: Virtual Visit Location Provider: Mobile   I discussed the limitations of  evaluation and management by telemedicine and the availability of in person appointments. The patient expressed understanding and agreed to proceed.    History of Present Illness: Abigail Moss is a 77 y.o. who identifies as a female who was assigned female at birth, and is being seen today for uri.  HPI: Patient was seen on 02/11/23. Was dx with sinusitis. Was given z pak. Started feeling better at first but is coughing again and is hoarse.     Review of Systems  Constitutional:  Negative for chills, fever and malaise/fatigue.  HENT:  Positive for congestion. Negative for sinus pain and sore throat.   Respiratory:  Positive for cough.   Musculoskeletal:  Negative for myalgias.  Neurological:  Negative for dizziness and headaches.    Problems:  Patient Active Problem List   Diagnosis Date Noted   Slow transit constipation 02/04/2022   Lumbar radiculopathy 09/30/2021   Degeneration of lumbar intervertebral disc 04/20/2020   Benzodiazepine dependence (HCC) 07/15/2018   Controlled substance agreement signed 07/15/2018   GAD (generalized anxiety disorder) 12/18/2015   Overweight (BMI 25.0-29.9) 12/18/2015   Hypothyroidism 12/11/2014   Allergic rhinitis 12/11/2014   Vitamin D deficiency 12/11/2014   Osteoporosis 12/11/2014   HTN (hypertension) 12/08/2012   GERD (gastroesophageal reflux disease) 12/08/2012    Allergies:  Allergies  Allergen Reactions   Baclofen    Elemental Sulfur  REACTION: itching   Gabapentin    Meloxicam    Neomycin     REACTION: itching   Latex Rash   Medications:  Current Outpatient Medications:    Albuterol Sulfate (PROAIR RESPICLICK) 108 (90 Base) MCG/ACT AEPB, Inhale 1-2 puffs into the lungs every 6 (six) hours as needed., Disp: 1 each, Rfl: 1   alendronate (FOSAMAX) 70 MG tablet, TAKE 1 TABLET BY MOUTH WEEKLY  WITH 8 OZ OF PLAIN WATER 30  MINUTES BEFORE FIRST FOOD, DRINK OR MEDS. STAY UPRIGHT FOR 30  MINS, Disp: 12 tablet, Rfl: 0   ALPRAZolam  (XANAX) 0.5 MG tablet, Take 1 tablet (0.5 mg total) by mouth at bedtime as needed., Disp: 30 tablet, Rfl: 3   aspirin EC 81 MG tablet, Take 81 mg by mouth daily., Disp: , Rfl:    azithromycin (ZITHROMAX Z-PAK) 250 MG tablet, As directed, Disp: 6 tablet, Rfl: 0   buPROPion (WELLBUTRIN XL) 150 MG 24 hr tablet, TAKE 1 TABLET BY MOUTH DAILY, Disp: 90 tablet, Rfl: 0   Calcium Citrate-Vitamin D (CALCIUM CITRATE + PO), Take 600 mg by mouth daily., Disp: , Rfl:    Cholecalciferol (VITAMIN D-3 PO), Take 800 mg by mouth daily., Disp: , Rfl:    diclofenac (VOLTAREN) 75 MG EC tablet, TAKE 1 TABLET BY MOUTH TWICE  DAILY AS NEEDED, Disp: 200 tablet, Rfl: 0   fluticasone (FLONASE) 50 MCG/ACT nasal spray, USE 2 SPRAYS IN BOTH NOSTRILS  DAILY, Disp: 48 g, Rfl: 1   HYDROcodone-acetaminophen (NORCO) 10-325 MG tablet, Take 1 tablet by mouth 3 (three) times daily as needed., Disp: , Rfl:    hydrocortisone (ANUSOL-HC) 25 MG suppository, Place 1 suppository (25 mg total) rectally 2 (two) times daily., Disp: 12 suppository, Rfl: 0   levothyroxine (SYNTHROID) 125 MCG tablet, TAKE 1 TABLET BY MOUTH DAILY, Disp: 100 tablet, Rfl: 2   losartan (COZAAR) 100 MG tablet, TAKE 1 TABLET BY MOUTH DAILY, Disp: 100 tablet, Rfl: 0   methocarbamol (ROBAXIN) 500 MG tablet, TAKE 1 TABLET BY MOUTH EVERY 8 (EIGHT) HOURS AS NEEDED FOR UP TO 7 DAYS FOR MUSCLE SPASMS., Disp: 90 tablet, Rfl: 1   montelukast (SINGULAIR) 10 MG tablet, TAKE 1 TABLET BY MOUTH DAILY AT  BEDTIME, Disp: 100 tablet, Rfl: 2   MULTIPLE VITAMIN PO, Take by mouth., Disp: , Rfl:    omeprazole (PRILOSEC) 40 MG capsule, TAKE 1 CAPSULE BY MOUTH DAILY, Disp: 100 capsule, Rfl: 2   Plecanatide (TRULANCE) 3 MG TABS, Take 3 mg by mouth daily., Disp: 90 tablet, Rfl: 1   rosuvastatin (CRESTOR) 5 MG tablet, Take 1 tablet (5 mg total) by mouth daily., Disp: 90 tablet, Rfl: 3  Observations/Objective: Patient is well-developed, well-nourished in no acute distress.  Resting comfortably   at home.  Head is normocephalic, atraumatic.  No labored breathing.  Speech is clear and coherent with logical content.  Patient is alert and oriented at baseline.  Raspy voice Dry cough  Assessment and Plan:  Abigail Moss in today with chief complaint of No chief complaint on file.   1. URI with cough and congestion 1. Take meds as prescribed 2. Use a cool mist humidifier especially during the winter months and when heat has been humid. 3. Use saline nose sprays frequently 4. Saline irrigations of the nose can be very helpful if done frequently.  * 4X daily for 1 week*  * Use of a nettie pot can be helpful with this. Follow directions with this* 5. Drink plenty  of fluids 6. Keep thermostat turn down low 7.For any cough or congestion- robitussin  8. For fever or aces or pains- take tylenol or ibuprofen appropriate for age and weight.  * for fevers greater than 101 orally you may alternate ibuprofen and tylenol every  3 hours.     Follow Up Instructions: I discussed the assessment and treatment plan with the patient. The patient was provided an opportunity to ask questions and all were answered. The patient agreed with the plan and demonstrated an understanding of the instructions.  A copy of instructions were sent to the patient via MyChart.  The patient was advised to call back or seek an in-person evaluation if the symptoms worsen or if the condition fails to improve as anticipated.  Time:  I spent 9 minutes with the patient via telehealth technology discussing the above problems/concerns.     Addeneum: patient wanted antiviral- so called in paxlovid Meds ordered this encounter  Medications   predniSONE (DELTASONE) 20 MG tablet    Sig: Take 2 tablets (40 mg total) by mouth daily with breakfast for 5 days. 2 po daily for 5 days    Dispense:  10 tablet    Refill:  0    Order Specific Question:   Supervising Provider    Answer:   Arville Care A [[id number]]    nirmatrelvir/ritonavir (PAXLOVID) 20 x 150 MG & 10 x 100MG  TABS    Sig: Take 3 tablets by mouth 2 (two) times daily for 5 days. (Take nirmatrelvir 150 mg two tablets twice daily for 5 days and ritonavir 100 mg one tablet twice daily for 5 days) Patient GFR is 76    Dispense:  30 tablet    Refill:  0    Order Specific Question:   Supervising Provider    Answer:   Nils Pyle [[id number]]     Mary-Margaret Daphine Deutscher, FNP

## 2023-02-20 NOTE — Telephone Encounter (Signed)
MMM called and spoke with patient. [facility]

## 2023-02-20 NOTE — Patient Instructions (Signed)

## 2023-02-20 NOTE — Addendum Note (Signed)
Addended by: Bennie Pierini on: 02/20/2023 04:35 PM   Modules accepted: Orders

## 2023-02-26 ENCOUNTER — Ambulatory Visit (INDEPENDENT_AMBULATORY_CARE_PROVIDER_SITE_OTHER): Payer: Medicare Other

## 2023-02-26 VITALS — Ht 66.0 in | Wt 167.0 lb

## 2023-02-26 DIAGNOSIS — Z0000 Encounter for general adult medical examination without abnormal findings: Secondary | ICD-10-CM

## 2023-02-26 DIAGNOSIS — Z780 Asymptomatic menopausal state: Secondary | ICD-10-CM

## 2023-02-26 NOTE — Patient Instructions (Signed)
Abigail Moss , Thank you for taking time to come for your Medicare Wellness Visit. I appreciate your ongoing commitment to your health goals. Please review the following plan we discussed and let me know if I can assist you in the future.   Referrals/Orders/Follow-Ups/Clinician Recommendations: Aim for 30 minutes of exercise or brisk walking, 6-8 glasses of water, and 5 servings of fruits and vegetables each day.   This is a list of the screening recommended for you and due dates:  Health Maintenance  Topic Date Due   COVID-19 Vaccine (3 - Moderna risk series) 09/24/2019   DEXA scan (bone density measurement)  04/11/2022   Flu Shot  02/05/2023   Medicare Annual Wellness Visit  02/26/2024   DTaP/Tdap/Td vaccine (4 - Td or Tdap) 12/11/2029   Pneumonia Vaccine  Completed   Hepatitis C Screening  Completed   Zoster (Shingles) Vaccine  Completed   HPV Vaccine  Aged Out   Colon Cancer Screening  Discontinued    Advanced directives: (Provided) Advance directive discussed with you today. I have provided a copy for you to complete at home and have notarized. Once this is complete, please bring a copy in to our office so we can scan it into your chart. Information on Advanced Care Planning can be found at [facility] Advance Health Care Directives Advance Health Care Directives ([email or web address]    Next Medicare Annual Wellness Visit scheduled for next year: Yes   Insert Preventive Care attachment  Insert FALL PREVENTION attachment if needed

## 2023-02-26 NOTE — Progress Notes (Signed)
Subjective:   Abigail Moss is a 77 y.o. female who presents for Medicare Annual (Subsequent) preventive examination.  Visit Complete: Virtual  I connected with  [name] on 02/26/23 by a audio enabled telemedicine application and verified that I am speaking with the correct person using two identifiers.  Patient Location: Home  Provider Location: Home Office  I discussed the limitations of evaluation and management by telemedicine. The patient expressed understanding and agreed to proceed.  Patient Medicare AWV questionnaire was completed by the patient on 02/26/2023; I have confirmed that all information answered by patient is correct and no changes since this date.  Review of Systems    Vital Signs: Unable to obtain new vitals due to this being a telehealth visit.  Cardiac Risk Factors include: advanced age (>34men, >62 women);dyslipidemia;hypertension     Objective:    Today's Vitals   02/26/23 0933  Weight: 167 lb (75.8 kg)  Height: 5\' 6"  (1.676 m)   Body mass index is 26.95 kg/m.     02/26/2023    9:37 AM 01/27/2023    6:16 PM 05/03/2022    1:17 PM 02/24/2022    9:49 AM 02/21/2021   10:09 AM 04/30/2020   10:52 AM 02/20/2020   10:20 AM  Advanced Directives  Does Patient Have a Medical Advance Directive? No No No No No No No  Would patient like information on creating a medical advance directive? Yes (MAU/Ambulatory/Procedural Areas - Information given)   No - Patient declined No - Patient declined  No - Patient declined    Current Medications (verified) Outpatient Encounter Medications as of 02/26/2023  Medication Sig   Albuterol Sulfate (PROAIR RESPICLICK) 108 (90 Base) MCG/ACT AEPB Inhale 1-2 puffs into the lungs every 6 (six) hours as needed.   alendronate (FOSAMAX) 70 MG tablet TAKE 1 TABLET BY MOUTH WEEKLY  WITH 8 OZ OF PLAIN WATER 30  MINUTES BEFORE FIRST FOOD, DRINK OR MEDS. STAY UPRIGHT FOR 30  MINS   ALPRAZolam (XANAX) 0.5 MG tablet Take 1 tablet (0.5 mg  total) by mouth at bedtime as needed.   aspirin EC 81 MG tablet Take 81 mg by mouth daily.   azithromycin (ZITHROMAX Z-PAK) 250 MG tablet As directed   buPROPion (WELLBUTRIN XL) 150 MG 24 hr tablet TAKE 1 TABLET BY MOUTH DAILY   Calcium Citrate-Vitamin D (CALCIUM CITRATE + PO) Take 600 mg by mouth daily.   Cholecalciferol (VITAMIN D-3 PO) Take 800 mg by mouth daily.   diclofenac (VOLTAREN) 75 MG EC tablet TAKE 1 TABLET BY MOUTH TWICE  DAILY AS NEEDED   fluticasone (FLONASE) 50 MCG/ACT nasal spray USE 2 SPRAYS IN BOTH NOSTRILS  DAILY   HYDROcodone-acetaminophen (NORCO) 10-325 MG tablet Take 1 tablet by mouth 3 (three) times daily as needed.   hydrocortisone (ANUSOL-HC) 25 MG suppository Place 1 suppository (25 mg total) rectally 2 (two) times daily.   levothyroxine (SYNTHROID) 125 MCG tablet TAKE 1 TABLET BY MOUTH DAILY   losartan (COZAAR) 100 MG tablet TAKE 1 TABLET BY MOUTH DAILY   methocarbamol (ROBAXIN) 500 MG tablet TAKE 1 TABLET BY MOUTH EVERY 8 (EIGHT) HOURS AS NEEDED FOR UP TO 7 DAYS FOR MUSCLE SPASMS.   montelukast (SINGULAIR) 10 MG tablet TAKE 1 TABLET BY MOUTH DAILY AT  BEDTIME   MULTIPLE VITAMIN PO Take by mouth.   omeprazole (PRILOSEC) 40 MG capsule TAKE 1 CAPSULE BY MOUTH DAILY   Plecanatide (TRULANCE) 3 MG TABS Take 3 mg by mouth daily.  rosuvastatin (CRESTOR) 5 MG tablet Take 1 tablet (5 mg total) by mouth daily.   No facility-administered encounter medications on file as of 02/26/2023.    Allergies (verified) Baclofen, Elemental sulfur, Gabapentin, Meloxicam, Neomycin, and Latex   History: Past Medical History:  Diagnosis Date   GERD (gastroesophageal reflux disease)    Hypertension    Osteopenia    Thyroid disease    Past Surgical History:  Procedure Laterality Date   EAR TUBE REMOVAL     ROTATOR CUFF REPAIR Left    TONSILLECTOMY     VEIN LIGATION AND STRIPPING     Family History  Problem Relation Age of Onset   Heart disease Mother 57       CHF   Diabetes  Father    Heart disease Sister        CHF   Cancer Sister        throat   Cancer Brother    Stroke Brother    COPD Brother    Breast cancer Neg Hx    Social History   Socioeconomic History   Marital status: Married    Spouse name: [name]   Number of children: 2   Years of education: 12   Highest education level: High school graduate  Occupational History   Occupation: retired     Comment: worked at [employer]  Tobacco Use   Smoking status: Former    Current packs/day: 0.00    Types: Cigarettes    Quit date: 07/07/1996    Years since quitting: 26.6   Smokeless tobacco: Never  Vaping Use   Vaping status: Never Used  Substance and Sexual Activity   Alcohol use: No    Alcohol/week: 0.0 standard drinks of alcohol   Drug use: No   Sexual activity: Not Currently  Other Topics Concern   Not on file  Social History Narrative   Lives at home with husband.  Grandson stays with them a lot.   Children live nearby   Social Determinants of Health   Financial Resource Strain: Low Risk  (02/26/2023)   Overall Financial Resource Strain (CARDIA)    Difficulty of Paying Living Expenses: Not hard at all  Food Insecurity: No Food Insecurity (02/26/2023)   Hunger Vital Sign    Worried About Running Out of Food in the Last Year: Never true    Ran Out of Food in the Last Year: Never true  Transportation Needs: No Transportation Needs (02/26/2023)   PRAPARE - Administrator, Civil Service (Medical): No    Lack of Transportation (Non-Medical): No  Physical Activity: Insufficiently Active (02/26/2023)   Exercise Vital Sign    Days of Exercise per Week: 3 days    Minutes of Exercise per Session: 30 min  Stress: No Stress Concern Present (02/26/2023)   [employer] of Occupational Health - Occupational Stress Questionnaire    Feeling of Stress : Not at all  Social Connections: Moderately Isolated (02/26/2023)   Social Connection and Isolation Panel [NHANES]    Frequency of  Communication with Friends and Family: More than three times a week    Frequency of Social Gatherings with Friends and Family: More than three times a week    Attends Religious Services: Never    Database administrator or Organizations: No    Attends Banker Meetings: Never    Marital Status: Married    Tobacco Counseling Counseling given: Not Answered   Clinical Intake:  Pre-visit preparation completed: Yes  Pain : No/denies pain     Nutritional Risks: None Diabetes: No  How often do you need to have someone help you when you read instructions, pamphlets, or other written materials from your doctor or pharmacy?: 1 - Never  Interpreter Needed?: No  Information entered by :: Renie Ora, LPN   Activities of Daily Living    02/26/2023    9:37 AM  In your present state of health, do you have any difficulty performing the following activities:  Hearing? 0  Vision? 0  Difficulty concentrating or making decisions? 0  Walking or climbing stairs? 0  Dressing or bathing? 0  Doing errands, shopping? 0  Preparing Food and eating ? N  Using the Toilet? N  In the past six months, have you accidently leaked urine? N  Do you have problems with loss of bowel control? N  Managing your Medications? N  Managing your Finances? N  Housekeeping or managing your Housekeeping? N    Patient Care Team: Junie Spencer, FNP as PCP - General (Family Medicine) Hart Carwin, MD (Inactive) as Consulting Physician (Gastroenterology) Landry Mellow, AUD (Audiology) Michaelle Copas, MD as Referring Physician (Optometry)  Indicate any recent Medical Services you may have received from other than [facility] providers in the past year (date may be approximate).     Assessment:   This is a routine wellness examination for [place].  Hearing/Vision screen Vision Screening - Comments:: Wears rx glasses - up to date with routine eye exams with  Dr.Le   Dietary issues and exercise  activities discussed:     Goals Addressed             This Visit's Progress    Exercise 3x per week (30 min per time)   On track    Walk for at least 30 minutes 3 times per week 02/24/22 - She hopes to get floors replaced in her trailer home       Depression Screen    02/26/2023    9:35 AM 02/11/2023   11:25 AM 02/05/2023   12:36 PM 07/17/2022    1:48 PM 04/03/2022    1:16 PM 02/24/2022    9:46 AM 02/04/2022   10:58 AM  PHQ 2/9 Scores  PHQ - 2 Score 0 0 0 0 0 0 0  PHQ- 9 Score 0 2 3 3  3 3     Fall Risk    02/26/2023    9:34 AM 02/11/2023   11:25 AM 07/17/2022    1:48 PM 04/03/2022    1:16 PM 02/24/2022    9:42 AM  Fall Risk   Falls in the past year? 0 0 1  1  Number falls in past yr: 0  0 0 0  Injury with Fall? 0  1 0 0  Risk for fall due to : No Fall Risks  History of fall(s) History of fall(s) Orthopedic patient;History of fall(s)  Follow up Falls prevention discussed  Education provided;Falls evaluation completed Falls evaluation completed Falls prevention discussed    MEDICARE RISK AT HOME: Medicare Risk at Home Any stairs in or around the home?: No If so, are there any without handrails?: No Home free of loose throw rugs in walkways, pet beds, electrical cords, etc?: Yes Adequate lighting in your home to reduce risk of falls?: Yes Life alert?: No Use of a cane, walker or w/c?: No Grab bars in the bathroom?: Yes Shower chair or bench in shower?: Yes Elevated toilet seat or a  handicapped toilet?: Yes  TIMED UP AND GO:  Was the test performed?  No    Cognitive Function:    11/21/2014   10:33 AM  MMSE - Mini Mental State Exam  Orientation to time 5  Orientation to Place 5  Registration 3  Attention/ Calculation 5  Recall 3  Language- name 2 objects 2  Language- repeat 1  Language- follow 3 step command 3  Language- read & follow direction 1  Write a sentence 1  Copy design 1  Total score 30        02/26/2023    9:37 AM 02/24/2022    9:51 AM 02/20/2020    10:22 AM 02/10/2019   10:18 AM  6CIT Screen  What Year? 0 points 0 points 0 points 0 points  What month? 0 points 0 points 0 points 0 points  What time? 0 points 0 points 0 points 0 points  Count back from 20 0 points 0 points 0 points 0 points  Months in reverse 0 points 0 points 0 points 0 points  Repeat phrase 0 points 0 points 0 points 0 points  Total Score 0 points 0 points 0 points 0 points    Immunizations Immunization History  Administered Date(s) Administered   Fluad Quad(high Dose 65+) 03/28/2020, 03/28/2022   Influenza Split 04/26/2009, 04/15/2010, 04/22/2011, 05/04/2012   Influenza, High Dose Seasonal PF 05/22/2018, 04/03/2019, 04/03/2019   Influenza,inj,Quad PF,6+ Mos 04/07/2013   Influenza-Unspecified 03/20/2016, 04/30/2017, 05/22/2018, 04/03/2019   Moderna Sars-Covid-2 Vaccination 07/30/2019, 08/27/2019   Novel Infuenza-h1n1-09 06/20/2008   Pneumococcal Conjugate-13 11/21/2014   Pneumococcal Polysaccharide-23 01/02/2011   Td 04/06/2006   Td,absorbed, Preservative Free, Adult Use, Lf Unspecified 04/06/2006   Tdap 12/12/2019   Zoster Recombinant(Shingrix) 02/04/2022, 02/05/2023    TDAP status: Up to date  Flu Vaccine status: Up to date  Pneumococcal vaccine status: Up to date  Covid-19 vaccine status: Completed vaccines  Qualifies for Shingles Vaccine? Yes   Zostavax completed Yes   Shingrix Completed?: Yes  Screening Tests Health Maintenance  Topic Date Due   COVID-19 Vaccine (3 - Moderna risk series) 09/24/2019   DEXA SCAN  04/11/2022   INFLUENZA VACCINE  02/05/2023   Medicare Annual Wellness (AWV)  02/26/2024   DTaP/Tdap/Td (4 - Td or Tdap) 12/11/2029   Pneumonia Vaccine 40+ Years old  Completed   Hepatitis C Screening  Completed   Zoster Vaccines- Shingrix  Completed   HPV VACCINES  Aged Out   Colonoscopy  Discontinued    Health Maintenance  Health Maintenance Due  Topic Date Due   COVID-19 Vaccine (3 - Moderna risk series) 09/24/2019    DEXA SCAN  04/11/2022   INFLUENZA VACCINE  02/05/2023    Colorectal cancer screening: No longer required.   Mammogram status: No longer required due to age.  Bone Density status: Ordered 02/26/2023. Pt provided with contact info and advised to call to schedule appt.  Lung Cancer Screening: (Low Dose CT Chest recommended if Age 3-80 years, 20 pack-year currently smoking OR have quit w/in 15years.) does not qualify.   Lung Cancer Screening Referral: n/a  Additional Screening:  Hepatitis C Screening: does not qualify; Completed 12/18/2015  Vision Screening: Recommended annual ophthalmology exams for early detection of glaucoma and other disorders of the eye. Is the patient up to date with their annual eye exam?  Yes  Who is the provider or what is the name of the office in which the patient attends annual eye exams? Dr.Le  If pt  is not established with a provider, would they like to be referred to a provider to establish care? No .   Dental Screening: Recommended annual dental exams for proper oral hygiene   Community Resource Referral / Chronic Care Management: CRR required this visit?  No   CCM required this visit?  No     Plan:     I have personally reviewed and noted the following in the patient's chart:   Medical and social history Use of alcohol, tobacco or illicit drugs  Current medications and supplements including opioid prescriptions. Patient is not currently taking opioid prescriptions. Functional ability and status Nutritional status Physical activity Advanced directives List of other physicians Hospitalizations, surgeries, and ER visits in previous 12 months Vitals Screenings to include cognitive, depression, and falls Referrals and appointments  In addition, I have reviewed and discussed with patient certain preventive protocols, quality metrics, and best practice recommendations. A written personalized care plan for preventive services as well as  general preventive health recommendations were provided to patient.     Lorrene Reid, LPN   3/66/4403   After Visit Summary: (MyChart) Due to this being a telephonic visit, the after visit summary with patients personalized plan was offered to patient via MyChart   Nurse Notes: none

## 2023-03-06 ENCOUNTER — Other Ambulatory Visit: Payer: Self-pay | Admitting: Family

## 2023-03-06 ENCOUNTER — Encounter: Payer: Self-pay | Admitting: Family

## 2023-03-10 ENCOUNTER — Other Ambulatory Visit: Payer: Self-pay | Admitting: *Deleted

## 2023-03-10 DIAGNOSIS — G8929 Other chronic pain: Secondary | ICD-10-CM

## 2023-03-10 DIAGNOSIS — B351 Tinea unguium: Secondary | ICD-10-CM | POA: Diagnosis not present

## 2023-03-10 DIAGNOSIS — M5416 Radiculopathy, lumbar region: Secondary | ICD-10-CM

## 2023-03-10 DIAGNOSIS — L84 Corns and callosities: Secondary | ICD-10-CM | POA: Diagnosis not present

## 2023-03-10 DIAGNOSIS — M5136 Other intervertebral disc degeneration, lumbar region: Secondary | ICD-10-CM

## 2023-03-10 DIAGNOSIS — M79676 Pain in unspecified toe(s): Secondary | ICD-10-CM | POA: Diagnosis not present

## 2023-03-10 DIAGNOSIS — I70203 Unspecified atherosclerosis of native arteries of extremities, bilateral legs: Secondary | ICD-10-CM | POA: Diagnosis not present

## 2023-03-10 MED ORDER — METHOCARBAMOL 500 MG PO TABS
ORAL_TABLET | ORAL | 2 refills | Status: DC
Start: 2023-03-10 — End: 2024-03-08

## 2023-03-17 ENCOUNTER — Other Ambulatory Visit: Payer: Self-pay | Admitting: Family Medicine

## 2023-03-18 ENCOUNTER — Other Ambulatory Visit: Payer: Self-pay | Admitting: Family

## 2023-03-20 ENCOUNTER — Other Ambulatory Visit: Payer: Self-pay | Admitting: Family

## 2023-03-20 DIAGNOSIS — I10 Essential (primary) hypertension: Secondary | ICD-10-CM

## 2023-03-24 DIAGNOSIS — M5136 Other intervertebral disc degeneration, lumbar region: Secondary | ICD-10-CM | POA: Diagnosis not present

## 2023-03-24 DIAGNOSIS — G894 Chronic pain syndrome: Secondary | ICD-10-CM | POA: Diagnosis not present

## 2023-03-24 DIAGNOSIS — Z5181 Encounter for therapeutic drug level monitoring: Secondary | ICD-10-CM | POA: Diagnosis not present

## 2023-03-24 DIAGNOSIS — M5416 Radiculopathy, lumbar region: Secondary | ICD-10-CM | POA: Diagnosis not present

## 2023-04-14 ENCOUNTER — Other Ambulatory Visit: Payer: Self-pay | Admitting: Family

## 2023-04-14 DIAGNOSIS — M810 Age-related osteoporosis without current pathological fracture: Secondary | ICD-10-CM

## 2023-04-17 ENCOUNTER — Other Ambulatory Visit: Payer: Self-pay | Admitting: Family

## 2023-04-18 ENCOUNTER — Other Ambulatory Visit: Payer: Self-pay | Admitting: Family

## 2023-04-18 DIAGNOSIS — F411 Generalized anxiety disorder: Secondary | ICD-10-CM

## 2023-04-27 ENCOUNTER — Ambulatory Visit (INDEPENDENT_AMBULATORY_CARE_PROVIDER_SITE_OTHER): Payer: Medicare Other

## 2023-04-27 ENCOUNTER — Ambulatory Visit (INDEPENDENT_AMBULATORY_CARE_PROVIDER_SITE_OTHER): Payer: Medicare Other | Admitting: Family Medicine

## 2023-04-27 ENCOUNTER — Encounter: Payer: Self-pay | Admitting: Family Medicine

## 2023-04-27 ENCOUNTER — Ambulatory Visit: Payer: Medicare Other

## 2023-04-27 VITALS — BP 130/85 | HR 67 | Temp 97.8°F | Ht 66.0 in | Wt 173.4 lb

## 2023-04-27 DIAGNOSIS — H60393 Other infective otitis externa, bilateral: Secondary | ICD-10-CM | POA: Diagnosis not present

## 2023-04-27 DIAGNOSIS — M7989 Other specified soft tissue disorders: Secondary | ICD-10-CM | POA: Diagnosis not present

## 2023-04-27 DIAGNOSIS — M25571 Pain in right ankle and joints of right foot: Secondary | ICD-10-CM

## 2023-04-27 MED ORDER — OFLOXACIN 0.3 % OT SOLN: 10.0000 [drp] | Freq: Every day | OTIC | 0 refills | Status: AC

## 2023-04-27 MED ORDER — PREDNISONE 10 MG (21) PO TBPK
ORAL_TABLET | ORAL | 0 refills | Status: DC
Start: 2023-04-27 — End: 2023-06-08

## 2023-04-27 NOTE — Progress Notes (Signed)
   Acute Office Visit  Subjective:     Patient ID: Abigail Moss, female    DOB: 1946/02/15, 77 y.o.   MRN: [id number]  Chief Complaint  Patient presents with   Ankle Pain    Ankle Pain  There was no injury mechanism. The pain is present in the right ankle. The quality of the pain is described as aching. The pain is moderate. The pain has been Fluctuating since onset. Pertinent negatives include no inability to bear weight, loss of motion, loss of sensation, muscle weakness, numbness or tingling. She reports no foreign bodies present. The symptoms are aggravated by weight bearing. She has tried elevation, immobilization, acetaminophen, rest and NSAIDs for the symptoms. The treatment provided mild relief.  Right ankle pain has been ongoing for the last few weeks.   She also report yellow-tan drainage from both of her ears. Denies pain or itching. No fever. Wears hearing aid. Gets infections frequently.   Review of Systems  Neurological:  Negative for tingling and numbness.        Objective:    BP 130/85   Pulse 67   Temp 97.8 F (36.6 C) (Temporal)   Ht 5\' 6"  (1.676 m)   Wt 173 lb 6 oz (78.6 kg)   SpO2 93%   BMI 27.98 kg/m    Physical Exam Vitals and nursing note reviewed.  Constitutional:      General: She is not in acute distress.    Appearance: She is not ill-appearing, toxic-appearing or diaphoretic.  HENT:     Right Ear: External ear normal. Drainage present.     Left Ear: External ear normal. Drainage present.     Ears:     Comments: Copious amounts of tan drainage in ear canals bilaterally.  Pulmonary:     Effort: No respiratory distress.     Breath sounds: Normal breath sounds.  Musculoskeletal:     Right ankle: Swelling (mild, posterior and lateral) present. No deformity, ecchymosis or lacerations. Tenderness present. Normal range of motion.     Right Achilles Tendon: Tenderness present. No defects.  Skin:    General: Skin is warm and dry.  Neurological:      Mental Status: She is alert and oriented to person, place, and time. Mental status is at baseline.     Gait: Gait abnormal (arrives in wheelchair).  Psychiatric:        Behavior: Behavior normal.     No results found for any visits on 04/27/23.      Assessment & Plan:   Demetrias was seen today for ankle pain.  Diagnoses and all orders for this visit:  Acute right ankle pain Suspect achilles tendonitis. Xray without fracture today- radiology read pending. Discussed rest, immobilization, elevated, ice. Continue voltaren BID. Prednisone burst as below. Will refer if symptoms worsen or do not improve.  -     Cancel: DG Ankle Complete Right; Future -     predniSONE (STERAPRED UNI-PAK 21 TAB) 10 MG (21) TBPK tablet; Use as directed on back of pill pack -     DG Ankle Complete Right; Future  Acute infective otitis externa, bilateral Ofloxacin as below.  -     ofloxacin (FLOXIN) 0.3 % OTIC solution; Place 10 drops into the right ear daily for 7 days.  Return if symptoms worsen or fail to improve.  The patient indicates understanding of these issues and agrees with the plan.  Gabriel Earing, FNP

## 2023-05-07 ENCOUNTER — Telehealth: Payer: Self-pay | Admitting: Family

## 2023-05-08 ENCOUNTER — Other Ambulatory Visit: Payer: Medicare Other

## 2023-05-08 ENCOUNTER — Ambulatory Visit: Payer: Medicare Other | Admitting: Family

## 2023-05-12 ENCOUNTER — Encounter: Payer: Self-pay | Admitting: Family

## 2023-05-12 ENCOUNTER — Ambulatory Visit (INDEPENDENT_AMBULATORY_CARE_PROVIDER_SITE_OTHER): Payer: Medicare Other | Admitting: Family

## 2023-05-12 VITALS — BP 125/80 | HR 75 | Temp 98.1°F | Ht 66.0 in | Wt 174.2 lb

## 2023-05-12 DIAGNOSIS — M5136 Other intervertebral disc degeneration, lumbar region with discogenic back pain only: Secondary | ICD-10-CM

## 2023-05-12 DIAGNOSIS — E039 Hypothyroidism, unspecified: Secondary | ICD-10-CM | POA: Diagnosis not present

## 2023-05-12 DIAGNOSIS — R350 Frequency of micturition: Secondary | ICD-10-CM | POA: Diagnosis not present

## 2023-05-12 DIAGNOSIS — E559 Vitamin D deficiency, unspecified: Secondary | ICD-10-CM

## 2023-05-12 DIAGNOSIS — F1320 Sedative, hypnotic or anxiolytic dependence, uncomplicated: Secondary | ICD-10-CM

## 2023-05-12 DIAGNOSIS — R42 Dizziness and giddiness: Secondary | ICD-10-CM

## 2023-05-12 DIAGNOSIS — K5901 Slow transit constipation: Secondary | ICD-10-CM

## 2023-05-12 DIAGNOSIS — K219 Gastro-esophageal reflux disease without esophagitis: Secondary | ICD-10-CM

## 2023-05-12 DIAGNOSIS — Z79899 Other long term (current) drug therapy: Secondary | ICD-10-CM

## 2023-05-12 DIAGNOSIS — F411 Generalized anxiety disorder: Secondary | ICD-10-CM

## 2023-05-12 DIAGNOSIS — E663 Overweight: Secondary | ICD-10-CM

## 2023-05-12 DIAGNOSIS — I10 Essential (primary) hypertension: Secondary | ICD-10-CM | POA: Diagnosis not present

## 2023-05-12 DIAGNOSIS — M5416 Radiculopathy, lumbar region: Secondary | ICD-10-CM

## 2023-05-12 LAB — URINALYSIS, COMPLETE
Bilirubin, UA: NEGATIVE
Glucose, UA: NEGATIVE
Nitrite, UA: NEGATIVE
Protein,UA: NEGATIVE
RBC, UA: NEGATIVE
Specific Gravity, UA: 1.025 (ref 1.005–1.030)
Urobilinogen, Ur: 0.2 mg/dL (ref 0.2–1.0)
pH, UA: 5.5 (ref 5.0–7.5)

## 2023-05-12 LAB — MICROSCOPIC EXAMINATION
Bacteria, UA: NONE SEEN
RBC, Urine: NONE SEEN /[HPF] (ref 0–2)
Renal Epithel, UA: NONE SEEN /[HPF]
Yeast, UA: NONE SEEN

## 2023-05-12 MED ORDER — ONDANSETRON HCL 4 MG PO TABS: 4.0000 mg | ORAL_TABLET | Freq: Three times a day (TID) | ORAL | 0 refills | Status: DC | PRN

## 2023-05-12 MED ORDER — ALPRAZOLAM 0.5 MG PO TABS: 0.5000 mg | ORAL_TABLET | Freq: Every evening | ORAL | 3 refills | Status: DC | PRN

## 2023-05-12 MED ORDER — OFLOXACIN 0.3 % OT SOLN: 5.0000 [drp] | Freq: Every day | OTIC | 1 refills | Status: DC

## 2023-05-12 NOTE — Progress Notes (Signed)
Subjective:    Patient ID: Abigail Moss, female    DOB: 12-Oct-1945, 77 y.o.   MRN: [id number]  Chief Complaint  Patient presents with   Medical Management of Chronic Issues    Husband + for covid    Pt presents to the office today for chronic follow up. She is drinking Ensure as needed since COVID she has not been able to eat meats.     She has osteoporosis and takes Fosamax.  Last Dexa scan 04/11/20.    She fell and broke her right wrist on 05/03/22. Has chronic back pain and Followed by Ortho and PT.   Her husband was just released from the hospital with COVID.  She reports she was feeling well, but started feeling dizzy since walking into office.  Hypertension This is a chronic problem. The current episode started more than 1 year ago. The problem is unchanged. The problem is uncontrolled. Associated symptoms include anxiety, malaise/fatigue and peripheral edema. Pertinent negatives include no shortness of breath. Risk factors for coronary artery disease include dyslipidemia. The current treatment provides moderate improvement. Identifiable causes of hypertension include a thyroid problem.  Constipation This is a chronic problem. The current episode started more than 1 year ago. The problem has been resolved since onset. Her stool frequency is 1 time per day. Associated symptoms include back pain. She has tried laxatives for the symptoms. The treatment provided moderate relief.  Gastroesophageal Reflux She complains of belching, heartburn and a hoarse voice. This is a chronic problem. The current episode started more than 1 year ago. The problem occurs occasionally. Associated symptoms include fatigue. She has tried a PPI for the symptoms. The treatment provided moderate relief.  Thyroid Problem Presents for follow-up visit. Symptoms include anxiety, constipation, dry skin, fatigue and hoarse voice. The symptoms have been stable.  Back Pain This is a chronic problem. The current  episode started more than 1 year ago. The problem occurs intermittently. The problem has been waxing and waning since onset. The pain is present in the lumbar spine. The quality of the pain is described as aching. The pain is at a severity of 3/10. The pain is moderate. She has tried analgesics for the symptoms. The treatment provided moderate relief.  Anxiety Presents for follow-up visit. Symptoms include excessive worry, nervous/anxious behavior and restlessness. Patient reports no shortness of breath. Symptoms occur occasionally. The severity of symptoms is moderate.    Urinary Frequency  This is a recurrent problem. The current episode started in the past 7 days. The problem has been waxing and waning. Associated symptoms include frequency and urgency. Pertinent negatives include no hematuria. She has tried increased fluids for the symptoms. The treatment provided mild relief.      Review of Systems  Constitutional:  Positive for fatigue and malaise/fatigue.  HENT:  Positive for hoarse voice.   Respiratory:  Negative for shortness of breath.   Gastrointestinal:  Positive for constipation and heartburn.  Genitourinary:  Positive for frequency and urgency. Negative for hematuria.  Musculoskeletal:  Positive for back pain.  Psychiatric/Behavioral:  The patient is nervous/anxious.   All other systems reviewed and are negative.      Objective:   Physical Exam Vitals reviewed.  Constitutional:      General: She is not in acute distress.    Appearance: She is well-developed.  HENT:     Head: Normocephalic and atraumatic.     Right Ear: Tympanic membrane normal. Drainage present. A PE tube is present.  Left Ear: Tympanic membrane normal. Drainage present. A PE tube is present.  Eyes:     Pupils: Pupils are equal, round, and reactive to light.  Neck:     Thyroid: No thyromegaly.  Cardiovascular:     Rate and Rhythm: Normal rate and regular rhythm.     Heart sounds: Normal heart  sounds. No murmur heard. Pulmonary:     Effort: Pulmonary effort is normal. No respiratory distress.     Breath sounds: Normal breath sounds. No wheezing.  Abdominal:     General: Bowel sounds are normal. There is no distension.     Palpations: Abdomen is soft.     Tenderness: There is no abdominal tenderness.  Musculoskeletal:        General: No tenderness. Normal range of motion.     Cervical back: Normal range of motion and neck supple.  Skin:    General: Skin is warm and dry.  Neurological:     Mental Status: She is alert and oriented to person, place, and time.     Cranial Nerves: No cranial nerve deficit.     Deep Tendon Reflexes: Reflexes are normal and symmetric.  Psychiatric:        Behavior: Behavior normal.        Thought Content: Thought content normal.        Judgment: Judgment normal.       BP 125/80   Pulse 75   Temp 98.1 F (36.7 C) (Temporal)   Ht 5\' 6"  (1.676 m)   Wt 174 lb 3.2 oz (79 kg)   SpO2 95%   BMI 28.12 kg/m      Assessment & Plan:   CLAUDENE GATLIFF comes in today with chief complaint of Medical Management of Chronic Issues (Husband + for covid/)   Diagnosis and orders addressed:  1. GAD (generalized anxiety disorder) - ALPRAZolam (XANAX) 0.5 MG tablet; Take 1 tablet (0.5 mg total) by mouth at bedtime as needed.  Dispense: 30 tablet; Refill: 3 - CBC with Differential/Platelet - CMP14+EGFR  2. Benzodiazepine dependence (HCC) - ALPRAZolam (XANAX) 0.5 MG tablet; Take 1 tablet (0.5 mg total) by mouth at bedtime as needed.  Dispense: 30 tablet; Refill: 3 - CBC with Differential/Platelet - CMP14+EGFR  3. Controlled substance agreement signed - ALPRAZolam (XANAX) 0.5 MG tablet; Take 1 tablet (0.5 mg total) by mouth at bedtime as needed.  Dispense: 30 tablet; Refill: 3 - CBC with Differential/Platelet - CMP14+EGFR  4. Primary hypertension - CBC with Differential/Platelet - CMP14+EGFR  5. Gastroesophageal reflux disease, unspecified whether  esophagitis present - CBC with Differential/Platelet - CMP14+EGFR  6. Hypothyroidism, unspecified type - CBC with Differential/Platelet - CMP14+EGFR  7. Lumbar radiculopathy - CBC with Differential/Platelet - CMP14+EGFR  8. Degeneration of intervertebral disc of lumbar region with discogenic back pain - CBC with Differential/Platelet - CMP14+EGFR  9. Overweight (BMI 25.0-29.9) - CBC with Differential/Platelet - CMP14+EGFR  10. Slow transit constipation - CBC with Differential/Platelet - CMP14+EGFR  11. Vitamin D deficiency - CBC with Differential/Platelet - CMP14+EGFR  12. Urinary frequency - CBC with Differential/Platelet - CMP14+EGFR - Urinalysis, Complete - Urine Culture   Labs pending Patient reviewed in [place] controlled database, no flags noted. Contract and drug screen are up to date.  Continue current medications  Pt will take home COVID test since she is dizzy Health Maintenance reviewed Diet and exercise encouraged  Follow up plan: 3 months   Jannifer Rodney, FNP

## 2023-05-12 NOTE — Addendum Note (Signed)
Addended by: Austin Miles F on: 05/12/2023 12:12 PM   Modules accepted: Orders

## 2023-05-12 NOTE — Patient Instructions (Signed)
Dizziness Dizziness is a common problem. It makes you feel unsteady or light-headed. You may feel like you're about to faint. Dizziness can lead to getting hurt if you stumble or fall. It's more common to feel dizzy if you're an older adult. Many things can cause you to feel dizzy. These include: Medicines. Dehydration. This is when there's not enough water in your body. Illness. Follow these instructions at home: Eating and drinking  Drink enough fluid to keep your pee (urine) pale yellow. This helps keep you from getting dehydrated. Try to drink more clear fluids, such as water. Do not drink alcohol. Try to limit how much caffeine you take in. Try to limit how much salt, also called sodium, you take in. Activity Try not to make quick movements. Stand up slowly from sitting in a chair. Steady yourself until you feel okay. In the morning, first sit up on the side of the bed. When you feel okay, hold onto something and slowly stand up. Do this until you know that your balance is okay. If you need to stand in one place for a long time, move your legs often. Tighten and relax the muscles in your legs while you're standing. Do not drive or use machines if you feel dizzy. Avoid bending down if you feel dizzy. Place items in your home so you can reach them without leaning over. Lifestyle Do not smoke, vape, or use products with nicotine or tobacco in them. If you need help quitting, talk with your health care provider. Try to lower your stress level. You can do this by using methods like yoga or meditation. Talk with your provider if you need help. General instructions Watch your dizziness for any changes. Take your medicines only as told by your provider. Talk with your provider if you think you're dizzy because of a medicine you're taking. Tell a friend or a family member that you're feeling dizzy. If they spot any changes in your behavior, have them call your provider. Contact a health care  provider if: Your dizziness doesn't go away, or you have new symptoms. Your dizziness gets worse. You feel like you may vomit. You have trouble hearing. You have a fever. You have neck pain or a stiff neck. You fall or get hurt. Get help right away if: You vomit each time you eat or drink. You have watery poop and can't eat or drink. You have trouble talking, walking, swallowing, or using your arms, hands, or legs. You feel very weak. You're bleeding. You're not thinking clearly, or you have trouble forming sentences. A friend or family member may spot this. Your vision changes, or you get a very bad headache. These symptoms may be an emergency. Call 911 right away. Do not wait to see if the symptoms will go away. Do not drive yourself to the hospital. This information is not intended to replace advice given to you by your health care provider. Make sure you discuss any questions you have with your health care provider. Document Revised: 08/07/2022 Document Reviewed: 08/07/2022 Elsevier Patient Education  2024 [employer].

## 2023-05-13 LAB — CBC WITH DIFFERENTIAL/PLATELET
Basophils Absolute: 0.1 10*3/uL (ref 0.0–0.2)
Basos: 1 %
EOS (ABSOLUTE): 0.3 10*3/uL (ref 0.0–0.4)
Eos: 6 %
Hematocrit: 37.5 % (ref 34.0–46.6)
Hemoglobin: 11.9 g/dL (ref 11.1–15.9)
Immature Grans (Abs): 0 10*3/uL (ref 0.0–0.1)
Immature Granulocytes: 0 %
Lymphocytes Absolute: 1.5 10*3/uL (ref 0.7–3.1)
Lymphs: 31 %
MCH: 31.7 pg (ref 26.6–33.0)
MCHC: 31.7 g/dL (ref 31.5–35.7)
MCV: 100 fL — ABNORMAL HIGH (ref 79–97)
Monocytes Absolute: 0.3 10*3/uL (ref 0.1–0.9)
Monocytes: 5 %
Neutrophils Absolute: 2.8 10*3/uL (ref 1.4–7.0)
Neutrophils: 57 %
Platelets: 239 10*3/uL (ref 150–450)
RBC: 3.75 x10E6/uL — ABNORMAL LOW (ref 3.77–5.28)
RDW: 12.4 % (ref 11.7–15.4)
WBC: 5 10*3/uL (ref 3.4–10.8)

## 2023-05-13 LAB — CMP14+EGFR
ALT: 20 [IU]/L (ref 0–32)
AST: 18 [IU]/L (ref 0–40)
Albumin: 3.8 g/dL (ref 3.8–4.8)
Alkaline Phosphatase: 54 [IU]/L (ref 44–121)
BUN/Creatinine Ratio: 22 (ref 12–28)
BUN: 22 mg/dL (ref 8–27)
Bilirubin Total: 0.3 mg/dL (ref 0.0–1.2)
CO2: 26 mmol/L (ref 20–29)
Calcium: 8.9 mg/dL (ref 8.7–10.3)
Chloride: 108 mmol/L — ABNORMAL HIGH (ref 96–106)
Creatinine, Ser: 0.99 mg/dL (ref 0.57–1.00)
Globulin, Total: 2.1 g/dL (ref 1.5–4.5)
Glucose: 85 mg/dL (ref 70–99)
Potassium: 4.4 mmol/L (ref 3.5–5.2)
Sodium: 146 mmol/L — ABNORMAL HIGH (ref 134–144)
Total Protein: 5.9 g/dL — ABNORMAL LOW (ref 6.0–8.5)
eGFR: 58 mL/min/{1.73_m2} — ABNORMAL LOW (ref >59)

## 2023-05-13 LAB — NOVEL CORONAVIRUS, NAA: SARS-CoV-2, NAA: NOT DETECTED

## 2023-05-14 ENCOUNTER — Other Ambulatory Visit: Payer: Self-pay | Admitting: Family

## 2023-05-14 DIAGNOSIS — K219 Gastro-esophageal reflux disease without esophagitis: Secondary | ICD-10-CM

## 2023-05-14 LAB — URINE CULTURE

## 2023-05-29 ENCOUNTER — Other Ambulatory Visit: Payer: Self-pay | Admitting: Family

## 2023-05-29 DIAGNOSIS — I10 Essential (primary) hypertension: Secondary | ICD-10-CM

## 2023-06-08 ENCOUNTER — Encounter: Payer: Self-pay | Admitting: Nurse Practitioner

## 2023-06-08 ENCOUNTER — Ambulatory Visit (INDEPENDENT_AMBULATORY_CARE_PROVIDER_SITE_OTHER): Payer: Medicare Other | Admitting: Nurse Practitioner

## 2023-06-08 VITALS — BP 145/84 | HR 71 | Temp 97.3°F | Resp 20 | Ht 66.0 in | Wt 166.0 lb

## 2023-06-08 DIAGNOSIS — J0101 Acute recurrent maxillary sinusitis: Secondary | ICD-10-CM | POA: Diagnosis not present

## 2023-06-08 MED ORDER — AMOXICILLIN-POT CLAVULANATE 875-125 MG PO TABS: 1.0000 | ORAL_TABLET | Freq: Two times a day (BID) | ORAL | 0 refills | Status: DC

## 2023-06-08 NOTE — Progress Notes (Signed)
Subjective:    Patient ID: Abigail STOBBS, female    DOB: Feb 27, 1946, 77 y.o.   MRN: [id number]   Chief Complaint: gums hurting (Had swollen gland a couple days ago) and Facial Pain   Sinusitis This is a new problem. The current episode started in the past 7 days. The problem has been waxing and waning since onset. There has been no fever. Her pain is at a severity of 6/10. The pain is mild. Associated symptoms include congestion, sinus pressure and a sore throat. Pertinent negatives include no coughing. Past treatments include nothing. The treatment provided no relief.    Patient Active Problem List   Diagnosis Date Noted   Slow transit constipation 02/04/2022   Lumbar radiculopathy 09/30/2021   Degeneration of lumbar intervertebral disc 04/20/2020   Benzodiazepine dependence (HCC) 07/15/2018   Controlled substance agreement signed 07/15/2018   GAD (generalized anxiety disorder) 12/18/2015   Overweight (BMI 25.0-29.9) 12/18/2015   Hypothyroidism 12/11/2014   Allergic rhinitis 12/11/2014   Vitamin D deficiency 12/11/2014   Osteoporosis 12/11/2014   HTN (hypertension) 12/08/2012   GERD (gastroesophageal reflux disease) 12/08/2012       Review of Systems  HENT:  Positive for congestion, sinus pressure and sore throat.   Respiratory:  Negative for cough.        Objective:   Physical Exam Vitals reviewed.  Constitutional:      Appearance: Normal appearance.  HENT:     Right Ear: Tympanic membrane normal.     Left Ear: Tympanic membrane normal.     Nose: Congestion and rhinorrhea present.     Right Sinus: Maxillary sinus tenderness present.     Left Sinus: Maxillary sinus tenderness present.  Cardiovascular:     Rate and Rhythm: Normal rate and regular rhythm.     Heart sounds: Normal heart sounds.  Pulmonary:     Effort: Pulmonary effort is normal.     Breath sounds: Normal breath sounds.  Neurological:     General: No focal deficit present.     Mental Status: She  is alert and oriented to person, place, and time.     BP (!) 145/84   Pulse 71   Temp (!) 97.3 F (36.3 C) (Temporal)   Resp 20   Ht 5\' 6"  (1.676 m)   Wt 166 lb (75.3 kg)   SpO2 94%   BMI 26.79 kg/m        Assessment & Plan:   [name] in today with chief complaint of gums hurting (Had swollen gland a couple days ago) and Facial Pain   1. Acute recurrent maxillary sinusitis 1. Take meds as prescribed 2. Use a cool mist humidifier especially during the winter months and when heat has been humid. 3. Use saline nose sprays frequently 4. Saline irrigations of the nose can be very helpful if done frequently.  * 4X daily for 1 week*  * Use of a nettie pot can be helpful with this. Follow directions with this* 5. Drink plenty of fluids 6. Keep thermostat turn down low 7.For any cough or congestion- OTC 8. For fever or aces or pains- take tylenol or ibuprofen appropriate for age and weight.  * for fevers greater than 101 orally you may alternate ibuprofen and tylenol every  3 hours.    - amoxicillin-clavulanate (AUGMENTIN) 875-125 MG tablet; Take 1 tablet by mouth 2 (two) times daily.  Dispense: 14 tablet; Refill: 0    The above assessment and management plan was  discussed with the patient. The patient verbalized understanding of and has agreed to the management plan. Patient is aware to call the clinic if symptoms persist or worsen. Patient is aware when to return to the clinic for a follow-up visit. Patient educated on when it is appropriate to go to the emergency department.   Mary-Margaret Daphine Deutscher, FNP

## 2023-06-08 NOTE — Patient Instructions (Signed)

## 2023-06-19 DIAGNOSIS — M5416 Radiculopathy, lumbar region: Secondary | ICD-10-CM | POA: Diagnosis not present

## 2023-06-19 DIAGNOSIS — M5116 Intervertebral disc disorders with radiculopathy, lumbar region: Secondary | ICD-10-CM | POA: Diagnosis not present

## 2023-06-29 ENCOUNTER — Other Ambulatory Visit: Payer: Self-pay | Admitting: Family

## 2023-06-29 DIAGNOSIS — F411 Generalized anxiety disorder: Secondary | ICD-10-CM

## 2023-07-17 ENCOUNTER — Other Ambulatory Visit: Payer: Self-pay | Admitting: Family

## 2023-07-17 DIAGNOSIS — M51369 Other intervertebral disc degeneration, lumbar region without mention of lumbar back pain or lower extremity pain: Secondary | ICD-10-CM

## 2023-07-17 DIAGNOSIS — M5416 Radiculopathy, lumbar region: Secondary | ICD-10-CM

## 2023-07-17 DIAGNOSIS — G8929 Other chronic pain: Secondary | ICD-10-CM

## 2023-07-28 ENCOUNTER — Other Ambulatory Visit: Payer: Self-pay | Admitting: Family Medicine

## 2023-07-28 DIAGNOSIS — M25571 Pain in right ankle and joints of right foot: Secondary | ICD-10-CM

## 2023-07-29 ENCOUNTER — Encounter: Payer: Self-pay | Admitting: Family

## 2023-08-07 ENCOUNTER — Other Ambulatory Visit: Payer: Self-pay | Admitting: Family

## 2023-08-07 DIAGNOSIS — I10 Essential (primary) hypertension: Secondary | ICD-10-CM

## 2023-08-14 ENCOUNTER — Ambulatory Visit (INDEPENDENT_AMBULATORY_CARE_PROVIDER_SITE_OTHER): Payer: Medicare Other | Admitting: Family

## 2023-08-14 ENCOUNTER — Ambulatory Visit (INDEPENDENT_AMBULATORY_CARE_PROVIDER_SITE_OTHER): Payer: Medicare Other

## 2023-08-14 ENCOUNTER — Other Ambulatory Visit: Payer: Self-pay | Admitting: Family

## 2023-08-14 ENCOUNTER — Encounter: Payer: Self-pay | Admitting: Family

## 2023-08-14 VITALS — BP 129/84 | HR 65 | Temp 98.5°F | Ht 66.0 in | Wt 172.0 lb

## 2023-08-14 DIAGNOSIS — K5901 Slow transit constipation: Secondary | ICD-10-CM | POA: Diagnosis not present

## 2023-08-14 DIAGNOSIS — Z79899 Other long term (current) drug therapy: Secondary | ICD-10-CM

## 2023-08-14 DIAGNOSIS — M5416 Radiculopathy, lumbar region: Secondary | ICD-10-CM

## 2023-08-14 DIAGNOSIS — M5136 Other intervertebral disc degeneration, lumbar region with discogenic back pain only: Secondary | ICD-10-CM

## 2023-08-14 DIAGNOSIS — I10 Essential (primary) hypertension: Secondary | ICD-10-CM | POA: Diagnosis not present

## 2023-08-14 DIAGNOSIS — F411 Generalized anxiety disorder: Secondary | ICD-10-CM | POA: Diagnosis not present

## 2023-08-14 DIAGNOSIS — K219 Gastro-esophageal reflux disease without esophagitis: Secondary | ICD-10-CM

## 2023-08-14 DIAGNOSIS — R5383 Other fatigue: Secondary | ICD-10-CM

## 2023-08-14 DIAGNOSIS — M810 Age-related osteoporosis without current pathological fracture: Secondary | ICD-10-CM

## 2023-08-14 DIAGNOSIS — Z780 Asymptomatic menopausal state: Secondary | ICD-10-CM | POA: Diagnosis not present

## 2023-08-14 DIAGNOSIS — F1320 Sedative, hypnotic or anxiolytic dependence, uncomplicated: Secondary | ICD-10-CM

## 2023-08-14 DIAGNOSIS — J301 Allergic rhinitis due to pollen: Secondary | ICD-10-CM

## 2023-08-14 DIAGNOSIS — E559 Vitamin D deficiency, unspecified: Secondary | ICD-10-CM | POA: Diagnosis not present

## 2023-08-14 DIAGNOSIS — E039 Hypothyroidism, unspecified: Secondary | ICD-10-CM

## 2023-08-14 DIAGNOSIS — R350 Frequency of micturition: Secondary | ICD-10-CM | POA: Diagnosis not present

## 2023-08-14 DIAGNOSIS — M8580 Other specified disorders of bone density and structure, unspecified site: Secondary | ICD-10-CM | POA: Insufficient documentation

## 2023-08-14 DIAGNOSIS — E663 Overweight: Secondary | ICD-10-CM

## 2023-08-14 DIAGNOSIS — M8589 Other specified disorders of bone density and structure, multiple sites: Secondary | ICD-10-CM | POA: Diagnosis not present

## 2023-08-14 LAB — MICROSCOPIC EXAMINATION
Epithelial Cells (non renal): NONE SEEN /[HPF] (ref 0–10)
RBC, Urine: NONE SEEN /[HPF] (ref 0–2)
Renal Epithel, UA: NONE SEEN /[HPF]
Yeast, UA: NONE SEEN

## 2023-08-14 LAB — URINALYSIS, COMPLETE
Bilirubin, UA: NEGATIVE
Glucose, UA: NEGATIVE
Leukocytes,UA: NEGATIVE
Nitrite, UA: NEGATIVE
Protein,UA: NEGATIVE
RBC, UA: NEGATIVE
Specific Gravity, UA: 1.02 (ref 1.005–1.030)
Urobilinogen, Ur: 0.2 mg/dL (ref 0.2–1.0)
pH, UA: 5.5 (ref 5.0–7.5)

## 2023-08-14 MED ORDER — CETIRIZINE HCL 10 MG PO TABS: 10.0000 mg | ORAL_TABLET | Freq: Every day | ORAL | 1 refills | Status: DC

## 2023-08-14 MED ORDER — OMEPRAZOLE 40 MG PO CPDR: 40.0000 mg | DELAYED_RELEASE_CAPSULE | Freq: Every day | ORAL | 0 refills | Status: DC

## 2023-08-14 MED ORDER — ALENDRONATE SODIUM 70 MG PO TABS: ORAL_TABLET | ORAL | 1 refills | Status: DC

## 2023-08-14 MED ORDER — ALPRAZOLAM 0.5 MG PO TABS: 0.5000 mg | ORAL_TABLET | Freq: Every evening | ORAL | 3 refills | Status: DC | PRN

## 2023-08-14 MED ORDER — ROSUVASTATIN CALCIUM 5 MG PO TABS: 5.0000 mg | ORAL_TABLET | Freq: Every day | ORAL | 3 refills | Status: AC

## 2023-08-14 MED ORDER — BUPROPION HCL ER (XL) 150 MG PO TB24: 150.0000 mg | ORAL_TABLET | Freq: Every day | ORAL | 0 refills | Status: DC

## 2023-08-14 MED ORDER — LEVOTHYROXINE SODIUM 125 MCG PO TABS: 125.0000 ug | ORAL_TABLET | Freq: Every day | ORAL | 2 refills | Status: DC

## 2023-08-14 MED ORDER — MONTELUKAST SODIUM 10 MG PO TABS: ORAL_TABLET | ORAL | 2 refills | Status: DC

## 2023-08-14 NOTE — Patient Instructions (Signed)
 Health Maintenance After Age 80 After age 27, you are at a higher risk for certain long-term diseases and infections as well as injuries from falls. Falls are a major cause of broken bones and head injuries in people who are older than age 73. Getting regular preventive care can help to keep you healthy and well. Preventive care includes getting regular testing and making lifestyle changes as recommended by your health care provider. Talk with your health care provider about: Which screenings and tests you should have. A screening is a test that checks for a disease when you have no symptoms. A diet and exercise plan that is right for you. What should I know about screenings and tests to prevent falls? Screening and testing are the best ways to find a health problem early. Early diagnosis and treatment give you the best chance of managing medical conditions that are common after age 90. Certain conditions and lifestyle choices may make you more likely to have a fall. Your health care provider may recommend: Regular vision checks. Poor vision and conditions such as cataracts can make you more likely to have a fall. If you wear glasses, make sure to get your prescription updated if your vision changes. Medicine review. Work with your health care provider to regularly review all of the medicines you are taking, including over-the-counter medicines. Ask your health care provider about any side effects that may make you more likely to have a fall. Tell your health care provider if any medicines that you take make you feel dizzy or sleepy. Strength and balance checks. Your health care provider may recommend certain tests to check your strength and balance while standing, walking, or changing positions. Foot health exam. Foot pain and numbness, as well as not wearing proper footwear, can make you more likely to have a fall. Screenings, including: Osteoporosis screening. Osteoporosis is a condition that causes  the bones to get weaker and break more easily. Blood pressure screening. Blood pressure changes and medicines to control blood pressure can make you feel dizzy. Depression screening. You may be more likely to have a fall if you have a fear of falling, feel depressed, or feel unable to do activities that you used to do. Alcohol  use screening. Using too much alcohol  can affect your balance and may make you more likely to have a fall. Follow these instructions at home: Lifestyle Do not drink alcohol  if: Your health care provider tells you not to drink. If you drink alcohol : Limit how much you have to: 0-1 drink a day for women. 0-2 drinks a day for men. Know how much alcohol  is in your drink. In the [place] one drink equals one 12 oz bottle of beer (355 mL), one 5 oz glass of wine (148 mL), or one 1 oz glass of hard liquor (44 mL). Do not use any products that contain nicotine or tobacco. These products include cigarettes, chewing tobacco, and vaping devices, such as e-cigarettes. If you need help quitting, ask your health care provider. Activity  Follow a regular exercise program to stay fit. This will help you maintain your balance. Ask your health care provider what types of exercise are appropriate for you. If you need a cane or walker, use it as recommended by your health care provider. Wear supportive shoes that have nonskid soles. Safety  Remove any tripping hazards, such as rugs, cords, and clutter. Install safety equipment such as grab bars in bathrooms and safety rails on stairs. Keep rooms and walkways  well-lit. General instructions Talk with your health care provider about your risks for falling. Tell your health care provider if: You fall. Be sure to tell your health care provider about all falls, even ones that seem minor. You feel dizzy, tiredness (fatigue), or off-balance. Take over-the-counter and prescription medicines only as told by your health care provider. These include  supplements. Eat a healthy diet and maintain a healthy weight. A healthy diet includes low-fat dairy products, low-fat (lean) meats, and fiber from whole grains, beans, and lots of fruits and vegetables. Stay current with your vaccines. Schedule regular health, dental, and eye exams. Summary Having a healthy lifestyle and getting preventive care can help to protect your health and wellness after age 15. Screening and testing are the best way to find a health problem early and help you avoid having a fall. Early diagnosis and treatment give you the best chance for managing medical conditions that are more common for people who are older than age 42. Falls are a major cause of broken bones and head injuries in people who are older than age 64. Take precautions to prevent a fall at home. Work with your health care provider to learn what changes you can make to improve your health and wellness and to prevent falls. This information is not intended to replace advice given to you by your health care provider. Make sure you discuss any questions you have with your health care provider. Document Revised: 11/12/2020 Document Reviewed: 11/12/2020 Elsevier Patient Education  2024 [employer].

## 2023-08-14 NOTE — Progress Notes (Signed)
 Subjective:    Patient ID: Abigail Moss, female    DOB: 1946/06/10, 78 y.o.   MRN: [id number]  Chief Complaint  Patient presents with   Medical Management of Chronic Issues    3 month   Pt presents to the office today for chronic follow up. She is drinking Ensure as needed since COVID she has not been able to eat meats.     She has osteoporosis and takes Fosamax .  Last Dexa scan 04/11/20.    She fell and broke her right wrist on 05/03/22. Has chronic back pain and Followed by Ortho. She completed PT.  Hypertension This is a chronic problem. The current episode started more than 1 year ago. The problem is unchanged. The problem is uncontrolled. Associated symptoms include anxiety, malaise/fatigue and peripheral edema. Pertinent negatives include no shortness of breath. Risk factors for coronary artery disease include dyslipidemia. The current treatment provides moderate improvement. Identifiable causes of hypertension include a thyroid  problem.  Constipation This is a chronic problem. The current episode started more than 1 year ago. The problem has been resolved since onset. Her stool frequency is 1 time per day. Associated symptoms include back pain. Pertinent negatives include no nausea or vomiting. She has tried laxatives for the symptoms. The treatment provided moderate relief.  Gastroesophageal Reflux She complains of belching and heartburn. She reports no hoarse voice or no nausea. This is a chronic problem. The current episode started more than 1 year ago. The problem occurs occasionally. Associated symptoms include fatigue. Risk factors include obesity. She has tried a PPI for the symptoms. The treatment provided moderate relief.  Thyroid  Problem Presents for follow-up visit. Symptoms include anxiety, constipation, dry skin and fatigue. Patient reports no hoarse voice. The symptoms have been stable.  Back Pain This is a chronic problem. The current episode started more than 1 year  ago. The problem occurs intermittently. The problem has been waxing and waning since onset. The pain is present in the lumbar spine. The quality of the pain is described as aching. The pain is at a severity of 4/10. The pain is moderate. She has tried analgesics for the symptoms. The treatment provided moderate relief.  Anxiety Presents for follow-up visit. Symptoms include excessive worry, nervous/anxious behavior and restlessness. Patient reports no nausea or shortness of breath. Symptoms occur most days (caring for her husband). The severity of symptoms is moderate.    Urinary Frequency  This is a recurrent problem. The current episode started in the past 7 days. The problem has been waxing and waning. The patient is experiencing no pain. Associated symptoms include frequency, hesitancy and urgency. Pertinent negatives include no nausea or vomiting. She has tried increased fluids for the symptoms. The treatment provided mild relief.      Review of Systems  Constitutional:  Positive for fatigue and malaise/fatigue.  HENT:  Negative for hoarse voice.   Respiratory:  Negative for shortness of breath.   Gastrointestinal:  Positive for constipation and heartburn. Negative for nausea and vomiting.  Genitourinary:  Positive for frequency, hesitancy and urgency.  Musculoskeletal:  Positive for back pain.  Psychiatric/Behavioral:  The patient is nervous/anxious.   All other systems reviewed and are negative.      Objective:   Physical Exam Vitals reviewed.  Constitutional:      General: She is not in acute distress.    Appearance: She is well-developed.  HENT:     Head: Normocephalic and atraumatic.     Right Ear: Tympanic  membrane normal. Drainage present. A PE tube is present.     Left Ear: Tympanic membrane normal. Drainage present. A PE tube is present.  Eyes:     Pupils: Pupils are equal, round, and reactive to light.  Neck:     Thyroid : No thyromegaly.  Cardiovascular:     Rate  and Rhythm: Normal rate and regular rhythm.     Heart sounds: Normal heart sounds. No murmur heard. Pulmonary:     Effort: Pulmonary effort is normal. No respiratory distress.     Breath sounds: Normal breath sounds. No wheezing.  Abdominal:     General: Bowel sounds are normal. There is no distension.     Palpations: Abdomen is soft.     Tenderness: There is no abdominal tenderness.  Musculoskeletal:        General: No tenderness. Normal range of motion.     Cervical back: Normal range of motion and neck supple.  Skin:    General: Skin is warm and dry.  Neurological:     Mental Status: She is alert and oriented to person, place, and time.     Cranial Nerves: No cranial nerve deficit.     Motor: Weakness present.     Gait: Gait abnormal (using cane, lost of balance.).     Deep Tendon Reflexes: Reflexes are normal and symmetric.  Psychiatric:        Behavior: Behavior normal.        Thought Content: Thought content normal.        Judgment: Judgment normal.      BP (!) 140/82   Pulse 65   Temp 98.5 F (36.9 C)   Ht 5' 6 (1.676 m)   Wt 172 lb (78 kg)   SpO2 96%   BMI 27.76 kg/m      Assessment & Plan:   Abigail Moss comes in today with chief complaint of Medical Management of Chronic Issues (3 month)   Diagnosis and orders addressed:  1. GAD (generalized anxiety disorder) (Primary) - CMP14+EGFR - buPROPion  (WELLBUTRIN  XL) 150 MG 24 hr tablet; Take 1 tablet (150 mg total) by mouth daily.  Dispense: 90 tablet; Refill: 0 - ALPRAZolam  (XANAX ) 0.5 MG tablet; Take 1 tablet (0.5 mg total) by mouth at bedtime as needed.  Dispense: 30 tablet; Refill: 3  2. Primary hypertension - CMP14+EGFR  3. Hypothyroidism, unspecified type - CMP14+EGFR - levothyroxine  (SYNTHROID ) 125 MCG tablet; Take 1 tablet (125 mcg total) by mouth daily.  Dispense: 100 tablet; Refill: 2 - TSH  4. Vitamin D  deficiency - CMP14+EGFR  5. Overweight (BMI 25.0-29.9) - CMP14+EGFR  6. Fatigue,  unspecified type - CMP14+EGFR  7. Osteoporosis, unspecified osteoporosis type, unspecified pathological fracture presence - CMP14+EGFR - alendronate  (FOSAMAX ) 70 MG tablet; TAKE 1 TABLET BY MOUTH WEEKLY  WITH 8 OZ OF PLAIN WATER 30  MINUTES BEFORE FIRST FOOD, DRINK OR MEDS. STAY UPRIGHT FOR 30  MINS  Dispense: 12 tablet; Refill: 1  8. Essential hypertension - CMP14+EGFR  9. Gastroesophageal reflux disease - CMP14+EGFR - omeprazole  (PRILOSEC) 40 MG capsule; Take 1 capsule (40 mg total) by mouth daily.  Dispense: 100 capsule; Refill: 0  10. Benzodiazepine dependence (HCC) - CMP14+EGFR - ALPRAZolam  (XANAX ) 0.5 MG tablet; Take 1 tablet (0.5 mg total) by mouth at bedtime as needed.  Dispense: 30 tablet; Refill: 3  11. Controlled substance agreement signed - CMP14+EGFR - ALPRAZolam  (XANAX ) 0.5 MG tablet; Take 1 tablet (0.5 mg total) by mouth at bedtime as needed.  Dispense: 30 tablet; Refill: 3  12. Degeneration of intervertebral disc of lumbar region with discogenic back pain - CMP14+EGFR  13. Gastroesophageal reflux disease, unspecified whether esophagitis present - CMP14+EGFR - omeprazole  (PRILOSEC) 40 MG capsule; Take 1 capsule (40 mg total) by mouth daily.  Dispense: 100 capsule; Refill: 0  14. Slow transit constipation - CMP14+EGFR  15. Lumbar radiculopathy - CMP14+EGFR  16. Urinary frequency - Urinalysis, Complete  17. Allergic rhinitis due to pollen, unspecified seasonality Start Zyrtec  10 mg - cetirizine  (ZYRTEC  ALLERGY) 10 MG tablet; Take 1 tablet (10 mg total) by mouth daily.  Dispense: 90 tablet; Refill: 1    Labs pending Patient reviewed in [place] controlled database, no flags noted. Contract and drug screen are up to date.  Continue current medications  Health Maintenance reviewed Diet and exercise encouraged  Follow up plan: 3 months   Bari Learn, FNP

## 2023-08-15 LAB — CMP14+EGFR
ALT: 18 [IU]/L (ref 0–32)
AST: 21 [IU]/L (ref 0–40)
Albumin: 4 g/dL (ref 3.8–4.8)
Alkaline Phosphatase: 59 [IU]/L (ref 44–121)
BUN/Creatinine Ratio: 17 (ref 12–28)
BUN: 14 mg/dL (ref 8–27)
Bilirubin Total: 0.3 mg/dL (ref 0.0–1.2)
CO2: 23 mmol/L (ref 20–29)
Calcium: 8.9 mg/dL (ref 8.7–10.3)
Chloride: 105 mmol/L (ref 96–106)
Creatinine, Ser: 0.84 mg/dL (ref 0.57–1.00)
Globulin, Total: 2 g/dL (ref 1.5–4.5)
Glucose: 79 mg/dL (ref 70–99)
Potassium: 4.3 mmol/L (ref 3.5–5.2)
Sodium: 143 mmol/L (ref 134–144)
Total Protein: 6 g/dL (ref 6.0–8.5)
eGFR: 71 mL/min/{1.73_m2} (ref >59)

## 2023-08-15 LAB — TSH: TSH: 11.6 u[IU]/mL — ABNORMAL HIGH (ref 0.450–4.500)

## 2023-08-18 ENCOUNTER — Other Ambulatory Visit: Payer: Self-pay | Admitting: Family

## 2023-08-18 DIAGNOSIS — G894 Chronic pain syndrome: Secondary | ICD-10-CM | POA: Diagnosis not present

## 2023-08-18 DIAGNOSIS — M5416 Radiculopathy, lumbar region: Secondary | ICD-10-CM | POA: Diagnosis not present

## 2023-08-18 DIAGNOSIS — M51362 Other intervertebral disc degeneration, lumbar region with discogenic back pain and lower extremity pain: Secondary | ICD-10-CM | POA: Diagnosis not present

## 2023-08-18 DIAGNOSIS — Z5181 Encounter for therapeutic drug level monitoring: Secondary | ICD-10-CM | POA: Diagnosis not present

## 2023-08-18 MED ORDER — LEVOTHYROXINE SODIUM 150 MCG PO TABS
150.0000 ug | ORAL_TABLET | Freq: Every day | ORAL | 4 refills | Status: DC
Start: 2023-08-18 — End: 2023-11-16

## 2023-09-25 DIAGNOSIS — M5416 Radiculopathy, lumbar region: Secondary | ICD-10-CM | POA: Diagnosis not present

## 2023-10-11 ENCOUNTER — Other Ambulatory Visit: Payer: Self-pay | Admitting: Family

## 2023-10-11 DIAGNOSIS — J301 Allergic rhinitis due to pollen: Secondary | ICD-10-CM

## 2023-10-16 ENCOUNTER — Other Ambulatory Visit: Payer: Self-pay | Admitting: Family

## 2023-10-16 DIAGNOSIS — I10 Essential (primary) hypertension: Secondary | ICD-10-CM

## 2023-10-20 DIAGNOSIS — B351 Tinea unguium: Secondary | ICD-10-CM | POA: Diagnosis not present

## 2023-10-20 DIAGNOSIS — M79675 Pain in left toe(s): Secondary | ICD-10-CM | POA: Diagnosis not present

## 2023-10-20 DIAGNOSIS — I70203 Unspecified atherosclerosis of native arteries of extremities, bilateral legs: Secondary | ICD-10-CM | POA: Diagnosis not present

## 2023-10-20 DIAGNOSIS — L84 Corns and callosities: Secondary | ICD-10-CM | POA: Diagnosis not present

## 2023-10-20 DIAGNOSIS — M79674 Pain in right toe(s): Secondary | ICD-10-CM | POA: Diagnosis not present

## 2023-11-10 DIAGNOSIS — L97511 Non-pressure chronic ulcer of other part of right foot limited to breakdown of skin: Secondary | ICD-10-CM | POA: Diagnosis not present

## 2023-11-12 ENCOUNTER — Other Ambulatory Visit: Payer: Self-pay | Admitting: Family

## 2023-11-12 DIAGNOSIS — K219 Gastro-esophageal reflux disease without esophagitis: Secondary | ICD-10-CM

## 2023-11-15 ENCOUNTER — Other Ambulatory Visit: Payer: Self-pay | Admitting: Family

## 2023-11-17 ENCOUNTER — Ambulatory Visit: Payer: Self-pay

## 2023-11-17 NOTE — Telephone Encounter (Signed)
 Appointment scheduled.

## 2023-11-17 NOTE — Telephone Encounter (Signed)
 Chief Complaint: weakness Symptoms: weakness, low BP last week Frequency: couple days Pertinent Negatives: Patient denies SOB, CP, palpitations, vomiting, diarrhea, bleeding, fever Disposition: [] ED /[] Urgent Care (no appt availability in office) / [x] Appointment(In office/virtual)/ []  [facility]/ [] Home Care/ [] Refused Recommended Disposition /[] [facility]/ []  Follow-up with PCP Additional Notes: Pt reports weakness for a few days "like I have the flu." Pt endorses cough but denies all other flu or viral symptoms. Pt states last weak her BP was lower than normal for her. Pt states last week her systolic numbers were in the range of 110-155 and her diastolic was 50-60. Today pt's BP is 118/78 which she states is better. Pt reports she feels better today than she did yesterday. Pt reports she can stand and walk and perform activities normally, but finds she needs to sleep more. Pt reports she thinks she needs to be tested for a UTI d/t ongoing urinary frequency without burning. Pt denies CP, SOB, palpitations, vomiting, nausea, diarrhea, bleeding, dizziness. RN scheduled pt for tomorrow at 1425. RN advised pt if she becomes more weak, experiences one-sided weakness, CP, SOB, vomiting, or diarrhea that she needs to call 911. Pt verbalized understanding.    Copied from CRM [id number]. Topic: Appointments - Appointment Scheduling >> Nov 17, 2023 12:43 PM Tiffany H wrote: Patient called to report a sudden drop in blood pressure.She's weak. Her blood pressure has been unstable for the past 2 weeks. Please assist. Reason for Disposition  Taking a medicine that could cause weakness (e.g., blood pressure medications, diuretics)  Answer Assessment - Initial Assessment Questions 1. DESCRIPTION: "Describe how you are feeling."     Weak like she has the flu 2. SEVERITY: "How bad is it?"  "Can you stand and walk?"   - MILD (0-3): Feels weak or tired, but does not interfere with work,  school or normal activities.   - MODERATE (4-7): Able to stand and walk; weakness interferes with work, school, or normal activities.   - SEVERE (8-10): Unable to stand or walk; unable to do usual activities.     Pt states she is sleeping more but can stand and walk and perform normal activities  3. ONSET: "When did these symptoms begin?" (e.g., hours, days, weeks, months)     A few days ago 4. CAUSE: "What do you think is causing the weakness or fatigue?" (e.g., not drinking enough fluids, medical problem, trouble sleeping)     "I dont have a good appetite, I dont eat mea, I lost my taste for a lot of things, last time I took a COVID shot I couldn't eat meat, I don't eat right but I eat plenty", "I have been trying to drink more, I drank some Gatorade" 5. NEW MEDICINES:  "Have you started on any new medicines recently?" (e.g., opioid pain medicines, benzodiazepines, muscle relaxants, antidepressants, antihistamines, neuroleptics, beta blockers)     No new medications 6. OTHER SYMPTOMS: "Do you have any other symptoms?" (e.g., chest pain, fever, cough, SOB, vomiting, diarrhea, bleeding, other areas of pain)     Low Bps last week. 110-115 systolic and 50-60s diastolic last week. BP is 118/78 today. States that is getting better. Taking losartan , feels like maybe it is too much. Denies diarrhea. Denies vomiting. Denies fever but has not checked. Denies bloody stools. Denies pain "just really weak." Denies dizziness. Denies SOB right now. "I felt a little short winded last night when I laid down", resolved. Cough last night. Denies CP. Denies difficulty  sleeping. Thinks she needs tested for a UTI d/t frequency but denies burning  Protocols used: Weakness (Generalized) and Fatigue-A-AH

## 2023-11-18 ENCOUNTER — Other Ambulatory Visit: Payer: Self-pay | Admitting: Family

## 2023-11-18 ENCOUNTER — Encounter: Payer: Self-pay | Admitting: Family Medicine

## 2023-11-18 ENCOUNTER — Ambulatory Visit (INDEPENDENT_AMBULATORY_CARE_PROVIDER_SITE_OTHER): Admitting: Family Medicine

## 2023-11-18 VITALS — BP 134/86 | HR 74 | Temp 97.0°F | Ht 66.0 in | Wt 171.6 lb

## 2023-11-18 DIAGNOSIS — E039 Hypothyroidism, unspecified: Secondary | ICD-10-CM | POA: Diagnosis not present

## 2023-11-18 DIAGNOSIS — J0101 Acute recurrent maxillary sinusitis: Secondary | ICD-10-CM | POA: Diagnosis not present

## 2023-11-18 DIAGNOSIS — R6889 Other general symptoms and signs: Secondary | ICD-10-CM | POA: Diagnosis not present

## 2023-11-18 DIAGNOSIS — R0981 Nasal congestion: Secondary | ICD-10-CM

## 2023-11-18 DIAGNOSIS — R350 Frequency of micturition: Secondary | ICD-10-CM | POA: Diagnosis not present

## 2023-11-18 DIAGNOSIS — F411 Generalized anxiety disorder: Secondary | ICD-10-CM

## 2023-11-18 DIAGNOSIS — J309 Allergic rhinitis, unspecified: Secondary | ICD-10-CM | POA: Diagnosis not present

## 2023-11-18 LAB — URINALYSIS, ROUTINE W REFLEX MICROSCOPIC
Bilirubin, UA: NEGATIVE
Glucose, UA: NEGATIVE
Ketones, UA: NEGATIVE
Leukocytes,UA: NEGATIVE
Nitrite, UA: NEGATIVE
Protein,UA: NEGATIVE
RBC, UA: NEGATIVE
Specific Gravity, UA: 1.015 (ref 1.005–1.030)
Urobilinogen, Ur: 0.2 mg/dL (ref 0.2–1.0)
pH, UA: 5.5 (ref 5.0–7.5)

## 2023-11-18 MED ORDER — AMOXICILLIN-POT CLAVULANATE 875-125 MG PO TABS
1.0000 | ORAL_TABLET | Freq: Two times a day (BID) | ORAL | 0 refills | Status: DC
Start: 2023-11-18 — End: 2024-03-08

## 2023-11-18 MED ORDER — PREDNISONE 10 MG PO TABS: ORAL_TABLET | ORAL | 0 refills | Status: DC

## 2023-11-18 NOTE — Progress Notes (Signed)
 Subjective:  Patient ID: Abigail Moss, female    DOB: 27-Oct-1945  Age: 78 y.o. MRN: [id number]  CC: Urinary Frequency (X 2-3 days ), Weakness, and Nasal Congestion (X 2-3 days )   HPI Abigail Moss presents for tired and weak. Onset 2 weeks ago. Blowing nose, yellow. Not more thirsty. No cough or fever. A little dyspneic when she lays down for the last few days.   Pt. Desired to broaden visit to a chronic condition review of thyroid  and anemia as well. I agreed to do a limited review as a result of her weakness. I encouraged her to follow up with [facility] for her chronic care.      11/18/2023    2:21 PM 08/14/2023   11:26 AM 08/14/2023   11:18 AM  Depression screen PHQ 2/9  Decreased Interest 0 0 0  Down, Depressed, Hopeless 0 0 0  PHQ - 2 Score 0 0 0  Altered sleeping 1 1 0  Tired, decreased energy 2 2 0  Change in appetite 1 1 0  Feeling bad or failure about yourself  0 0 0  Trouble concentrating 0 0 0  Moving slowly or fidgety/restless 0 0 0  Suicidal thoughts 0 0 0  PHQ-9 Score 4 4 0  Difficult doing work/chores Not difficult at all Not difficult at all Not difficult at all    History Abigail Moss has a past medical history of GERD (gastroesophageal reflux disease), Hypertension, Osteopenia, and Thyroid  disease.   She has a past surgical history that includes Tonsillectomy; Ear tube removal; Rotator cuff repair (Left); and Vein ligation and stripping.   Her family history includes COPD in her brother; Cancer in her brother and sister; Diabetes in her father; Heart disease in her sister; Heart disease (age of onset: 56) in her mother; Stroke in her brother.She reports that she quit smoking about 27 years ago. Her smoking use included cigarettes. She has never used smokeless tobacco. She reports that she does not drink alcohol and does not use drugs.    ROS Review of Systems  Constitutional:  Negative for activity change, appetite change, chills, fever and unexpected weight change.   HENT:  Positive for congestion, postnasal drip and rhinorrhea. Negative for ear discharge, ear pain, hearing loss, nosebleeds, sneezing and trouble swallowing.        Denies hair loss  Respiratory:  Negative for chest tightness and shortness of breath.   Cardiovascular:  Negative for chest pain and palpitations.  Gastrointestinal:  Negative for constipation.  Endocrine: Negative for cold intolerance and heat intolerance.  Genitourinary:  Positive for frequency.  Skin:  Negative for rash.    Objective:  BP 134/86   Pulse 74   Temp (!) 97 F (36.1 C)   Ht 5\' 6"  (1.676 m)   Wt 171 lb 9.6 oz (77.8 kg)   SpO2 93%   BMI 27.70 kg/m   BP Readings from Last 3 Encounters:  11/18/23 134/86  08/14/23 129/84  06/08/23 (!) 145/84    Wt Readings from Last 3 Encounters:  11/18/23 171 lb 9.6 oz (77.8 kg)  08/14/23 172 lb (78 kg)  06/08/23 166 lb (75.3 kg)     Physical Exam Constitutional:      General: She is not in acute distress.    Appearance: She is well-developed.  Cardiovascular:     Rate and Rhythm: Normal rate and regular rhythm.  Pulmonary:     Breath sounds: Normal breath sounds.  Musculoskeletal:  General: Normal range of motion.  Skin:    General: Skin is warm and dry.  Neurological:     Mental Status: She is alert and oriented to person, place, and time.      Assessment & Plan:  Urinary frequency -     Urinalysis, Routine w reflex microscopic -     Urine Culture  Nasal congestion -     COVID-19, Flu A+B and RSV -     CBC with Differential/Platelet  Acute recurrent maxillary sinusitis -     CBC with Differential/Platelet  Allergic rhinitis, unspecified seasonality, unspecified trigger -     CBC with Differential/Platelet  Hypothyroidism, unspecified type -     CBC with Differential/Platelet -     CMP14+EGFR -     TSH + free T4  Other orders -     Amoxicillin -Pot Clavulanate; Take 1 tablet by mouth 2 (two) times daily. Take all of this  medication  Dispense: 20 tablet; Refill: 0 -     predniSONE ; Take 5 daily for 2 days followed by 4,3,2 and 1 for 2 days each.  Dispense: 30 tablet; Refill: 0     Follow-up: Return if symptoms worsen or fail to improve.  Roise Cleaver, M.D.

## 2023-11-19 LAB — CBC WITH DIFFERENTIAL/PLATELET
Basophils Absolute: 0.1 10*3/uL (ref 0.0–0.2)
Basos: 1 %
EOS (ABSOLUTE): 0.3 10*3/uL (ref 0.0–0.4)
Eos: 7 %
Hematocrit: 35.6 % (ref 34.0–46.6)
Hemoglobin: 11.6 g/dL (ref 11.1–15.9)
Immature Grans (Abs): 0 10*3/uL (ref 0.0–0.1)
Immature Granulocytes: 0 %
Lymphocytes Absolute: 1.5 10*3/uL (ref 0.7–3.1)
Lymphs: 34 %
MCH: 31.8 pg (ref 26.6–33.0)
MCHC: 32.6 g/dL (ref 31.5–35.7)
MCV: 98 fL — ABNORMAL HIGH (ref 79–97)
Monocytes Absolute: 0.4 10*3/uL (ref 0.1–0.9)
Monocytes: 9 %
Neutrophils Absolute: 2.1 10*3/uL (ref 1.4–7.0)
Neutrophils: 49 %
Platelets: 226 10*3/uL (ref 150–450)
RBC: 3.65 x10E6/uL — ABNORMAL LOW (ref 3.77–5.28)
RDW: 12.7 % (ref 11.7–15.4)
WBC: 4.3 10*3/uL (ref 3.4–10.8)

## 2023-11-19 LAB — CMP14+EGFR
ALT: 18 IU/L (ref 0–32)
AST: 18 IU/L (ref 0–40)
Albumin: 4 g/dL (ref 3.8–4.8)
Alkaline Phosphatase: 62 IU/L (ref 44–121)
BUN/Creatinine Ratio: 23 (ref 12–28)
BUN: 21 mg/dL (ref 8–27)
Bilirubin Total: 0.4 mg/dL (ref 0.0–1.2)
CO2: 24 mmol/L (ref 20–29)
Calcium: 9 mg/dL (ref 8.7–10.3)
Chloride: 107 mmol/L — ABNORMAL HIGH (ref 96–106)
Creatinine, Ser: 0.92 mg/dL (ref 0.57–1.00)
Globulin, Total: 2.1 g/dL (ref 1.5–4.5)
Glucose: 80 mg/dL (ref 70–99)
Potassium: 4.4 mmol/L (ref 3.5–5.2)
Sodium: 145 mmol/L — ABNORMAL HIGH (ref 134–144)
Total Protein: 6.1 g/dL (ref 6.0–8.5)
eGFR: 63 mL/min/{1.73_m2} (ref >59)

## 2023-11-19 LAB — TSH+FREE T4
Free T4: 1.69 ng/dL (ref 0.82–1.77)
TSH: 0.168 u[IU]/mL — ABNORMAL LOW (ref 0.450–4.500)

## 2023-11-20 LAB — URINE CULTURE: Organism ID, Bacteria: NO GROWTH

## 2023-11-21 LAB — COVID-19, FLU A+B AND RSV
Influenza A, NAA: NOT DETECTED
Influenza B, NAA: NOT DETECTED
RSV, NAA: NOT DETECTED
SARS-CoV-2, NAA: NOT DETECTED

## 2023-11-22 ENCOUNTER — Ambulatory Visit: Payer: Self-pay | Admitting: Family Medicine

## 2023-11-30 IMAGING — MG MM DIGITAL SCREENING BILAT W/ TOMO AND CAD
8 series · 8 of 24 positions shown · non-contrast
Comparison: Previous exam(s).

CLINICAL DATA: Screening.

EXAM:
DIGITAL SCREENING BILATERAL MAMMOGRAM WITH TOMOSYNTHESIS AND CAD
TECHNIQUE: Bilateral screening digital craniocaudal and mediolateral oblique
mammograms were obtained. Bilateral screening digital breast
tomosynthesis was performed. The images were evaluated with
computer-aided detection.

[L CC synth-2D]
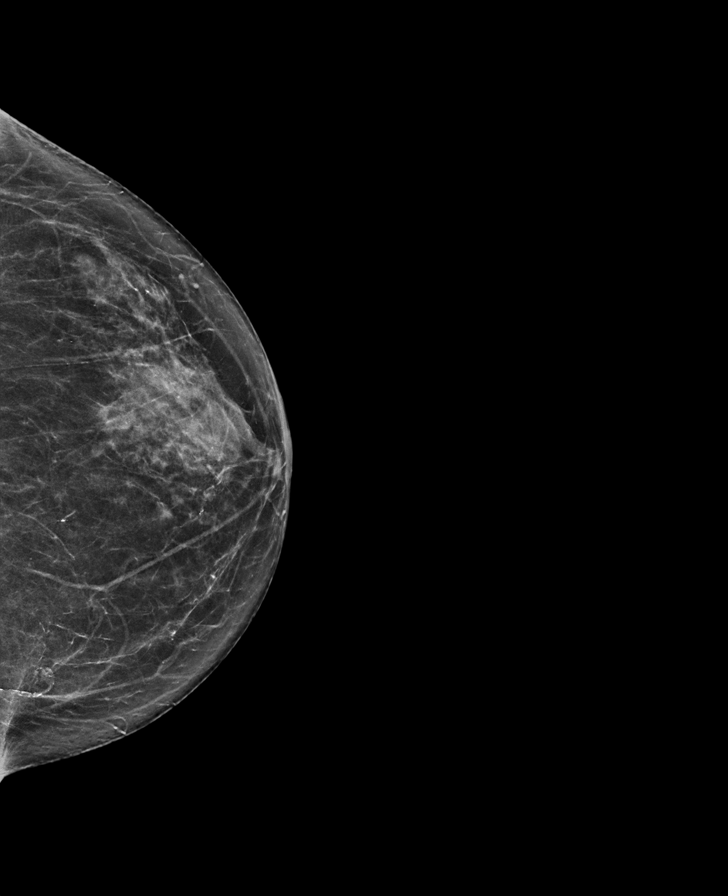

[L MLO synth-2D]
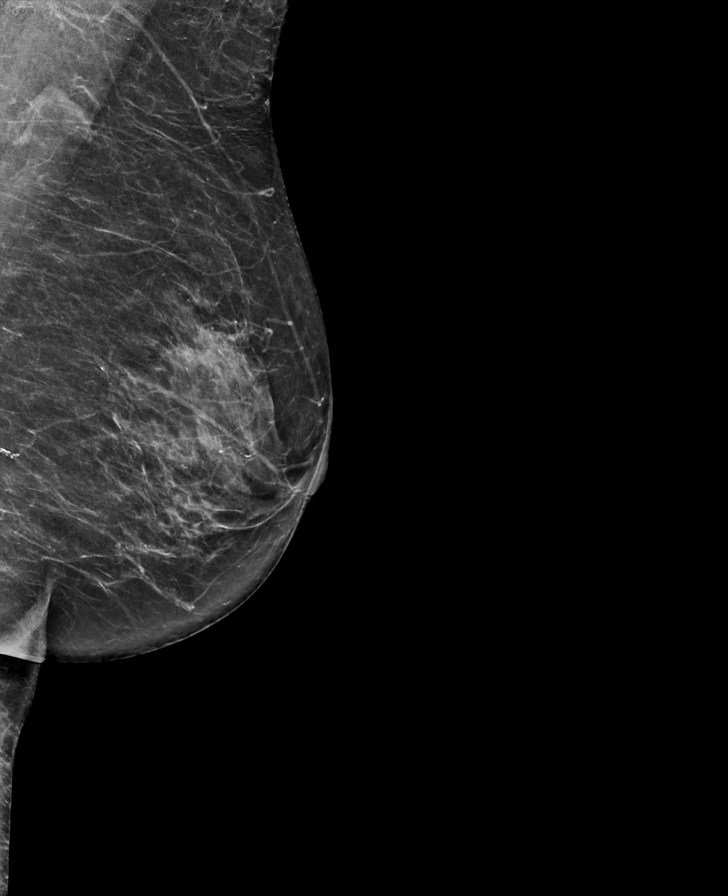

[R CC synth-2D]
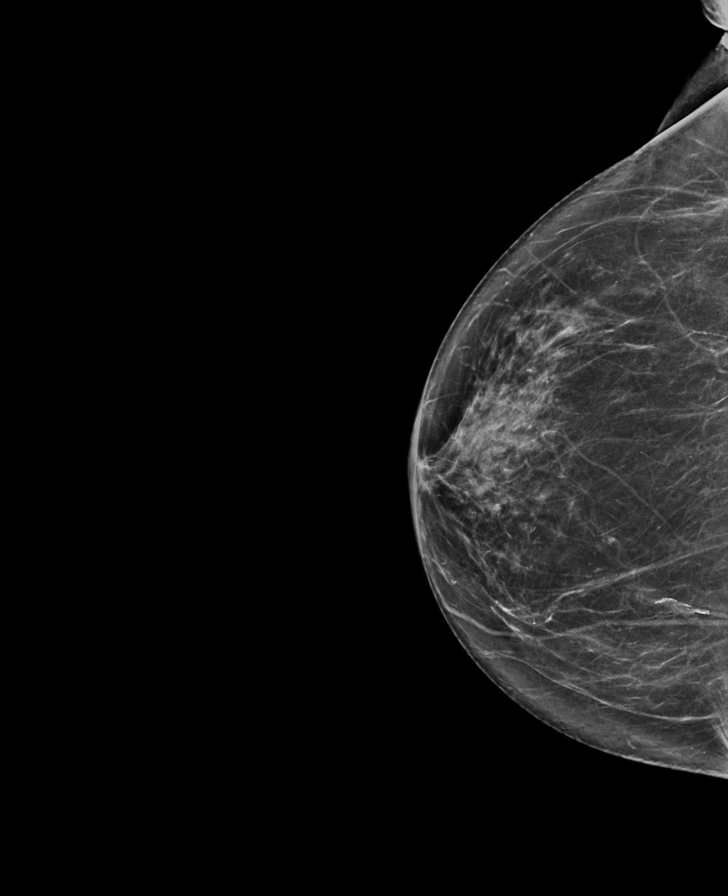

[R MLO synth-2D]
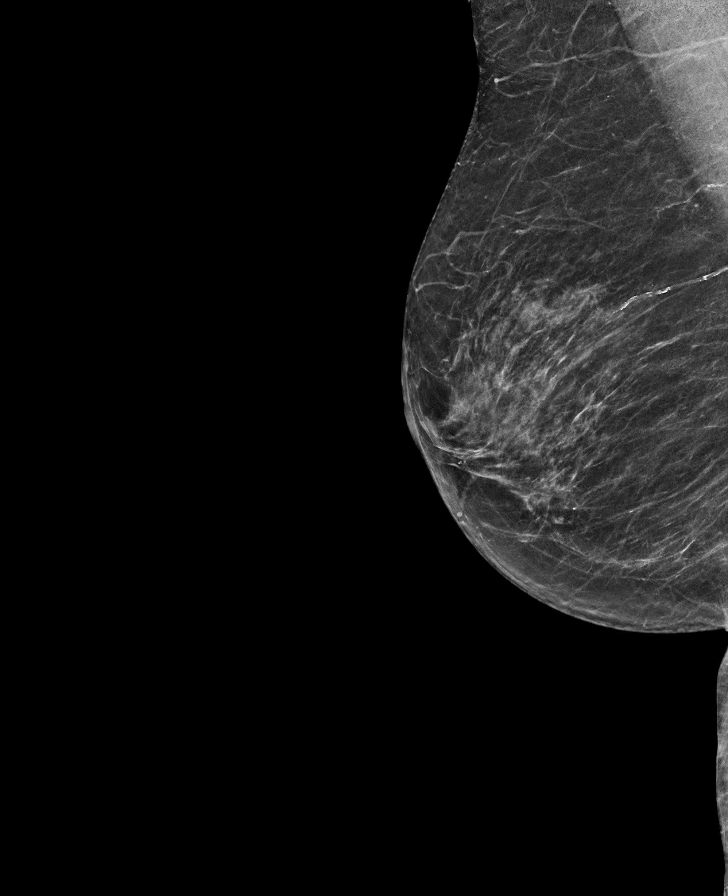

[R MLO tomo · tomo slice 32/63.0]
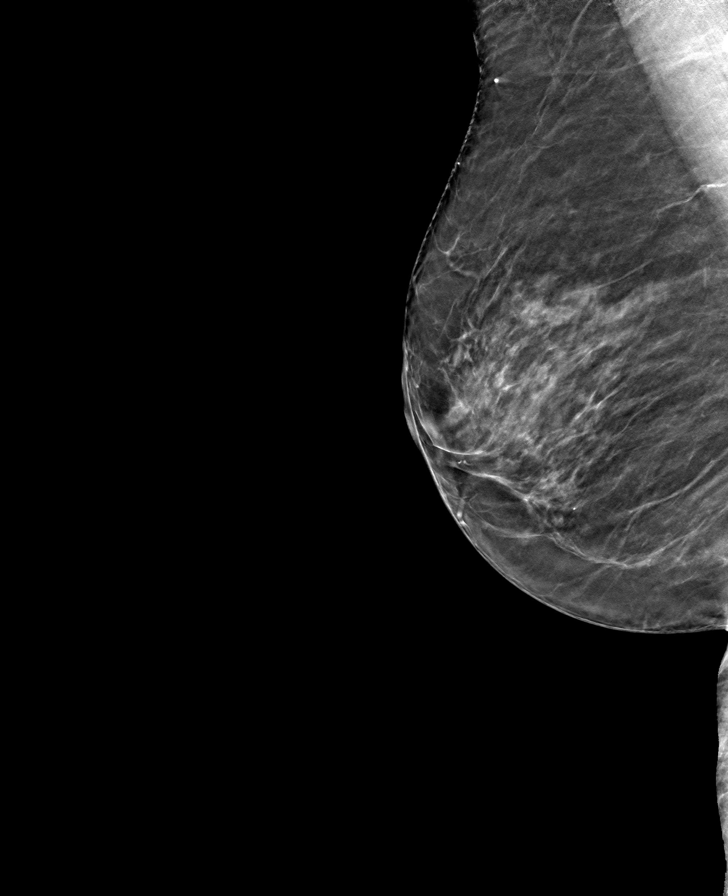

[L CC tomo · tomo slice 31/60.0]
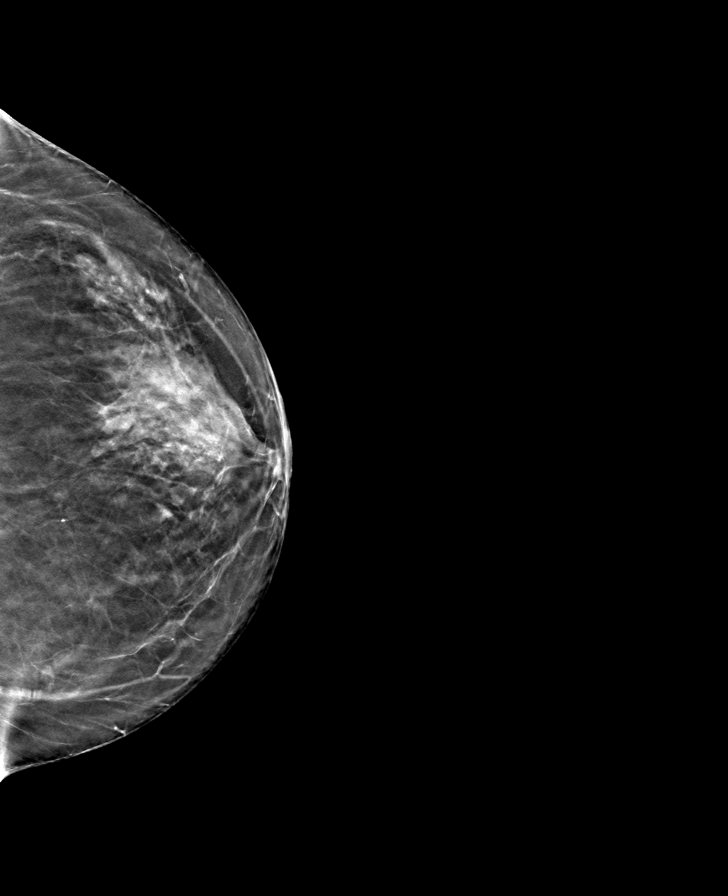

[L MLO tomo · tomo slice 33/66.0]
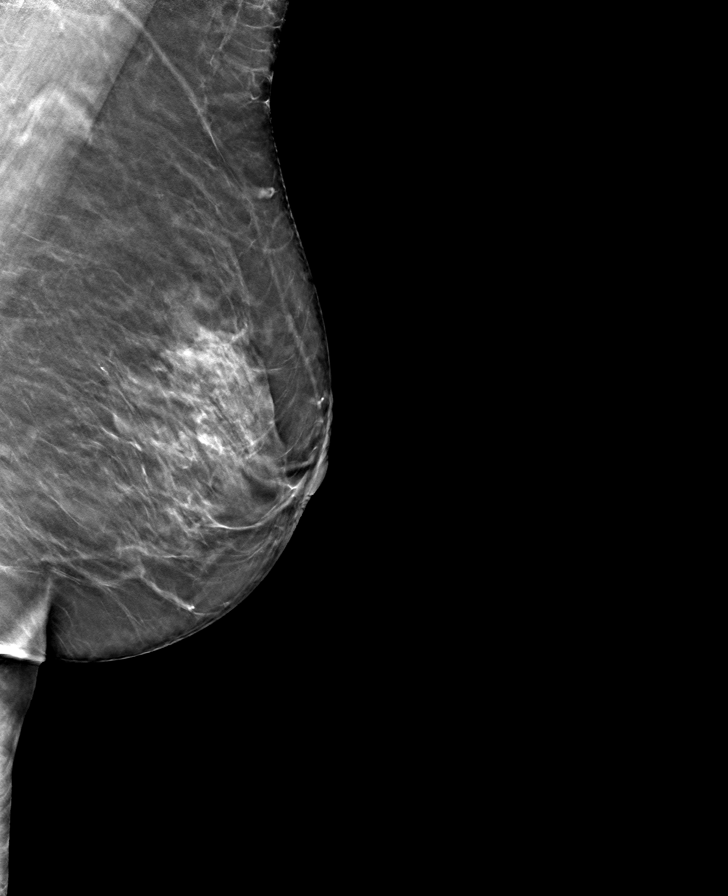

[R CC tomo · tomo slice 34/67.0]
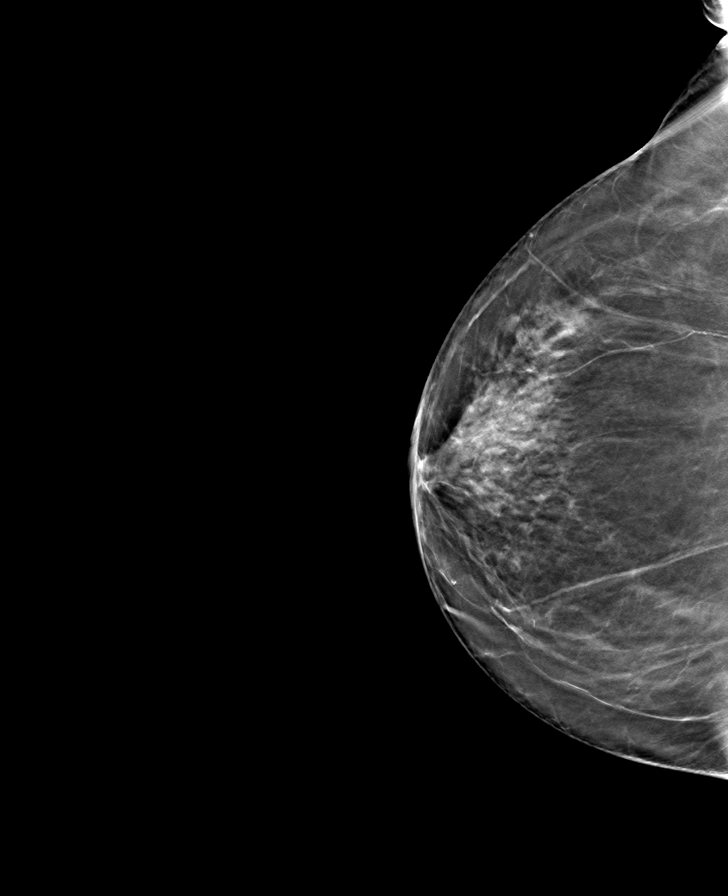

[8 of 24 positions shown; findings below may reference images not displayed]

ACR Breast Density Category c: The breast tissue is heterogeneously
dense, which may obscure small masses.
FINDINGS: There are no findings suspicious for malignancy.
IMPRESSION: No mammographic evidence of malignancy. A result letter of this
screening mammogram will be mailed directly to the patient.

RECOMMENDATION:
Screening mammogram in one year. (Code:[id number])

BI-RADS CATEGORY  1: Negative.

## 2023-12-03 DIAGNOSIS — L97511 Non-pressure chronic ulcer of other part of right foot limited to breakdown of skin: Secondary | ICD-10-CM | POA: Diagnosis not present

## 2023-12-16 DIAGNOSIS — Z79899 Other long term (current) drug therapy: Secondary | ICD-10-CM | POA: Diagnosis not present

## 2023-12-16 DIAGNOSIS — M51362 Other intervertebral disc degeneration, lumbar region with discogenic back pain and lower extremity pain: Secondary | ICD-10-CM | POA: Diagnosis not present

## 2023-12-16 DIAGNOSIS — M5416 Radiculopathy, lumbar region: Secondary | ICD-10-CM | POA: Diagnosis not present

## 2023-12-25 ENCOUNTER — Other Ambulatory Visit: Payer: Self-pay | Admitting: Family

## 2023-12-25 DIAGNOSIS — I10 Essential (primary) hypertension: Secondary | ICD-10-CM

## 2023-12-28 NOTE — Telephone Encounter (Signed)
 Christy NTBS in Aug for a 6 mos FU RF sent to pharmacy

## 2023-12-28 NOTE — Telephone Encounter (Signed)
 Appt scheduled for 02/18/2024

## 2024-01-09 ENCOUNTER — Other Ambulatory Visit: Payer: Self-pay | Admitting: Family

## 2024-01-12 DIAGNOSIS — M79675 Pain in left toe(s): Secondary | ICD-10-CM | POA: Diagnosis not present

## 2024-01-12 DIAGNOSIS — L84 Corns and callosities: Secondary | ICD-10-CM | POA: Diagnosis not present

## 2024-01-12 DIAGNOSIS — I70203 Unspecified atherosclerosis of native arteries of extremities, bilateral legs: Secondary | ICD-10-CM | POA: Diagnosis not present

## 2024-01-12 DIAGNOSIS — M79674 Pain in right toe(s): Secondary | ICD-10-CM | POA: Diagnosis not present

## 2024-01-12 DIAGNOSIS — B351 Tinea unguium: Secondary | ICD-10-CM | POA: Diagnosis not present

## 2024-01-29 ENCOUNTER — Other Ambulatory Visit: Payer: Self-pay | Admitting: Family

## 2024-01-29 DIAGNOSIS — K219 Gastro-esophageal reflux disease without esophagitis: Secondary | ICD-10-CM

## 2024-02-12 ENCOUNTER — Other Ambulatory Visit: Payer: Self-pay | Admitting: Family

## 2024-02-18 ENCOUNTER — Ambulatory Visit: Admitting: Family

## 2024-02-19 ENCOUNTER — Encounter: Payer: Self-pay | Admitting: Family

## 2024-03-02 ENCOUNTER — Ambulatory Visit: Payer: Medicare Other

## 2024-03-02 VITALS — BP 134/86 | HR 74 | Ht 66.0 in | Wt 171.0 lb

## 2024-03-02 DIAGNOSIS — Z0000 Encounter for general adult medical examination without abnormal findings: Secondary | ICD-10-CM

## 2024-03-02 NOTE — Patient Instructions (Signed)
 Abigail Moss , Thank you for taking time out of your busy schedule to complete your Annual Wellness Visit with me. I enjoyed our conversation and look forward to speaking with you again next year. I, as well as your care team,  appreciate your ongoing commitment to your health goals. Please review the following plan we discussed and let me know if I can assist you in the future. Your Game plan/ To Do List    Referrals: If you haven't heard from the office you've been referred to, please reach out to them at the phone provided.   Follow up Visits: We will see or speak with you next year for your Next Medicare AWV with our clinical staff on 03/06/25 at 1:50p.m Have you seen your provider in the last 6 months (3 months if uncontrolled diabetes)? Yes  Clinician Recommendations:  Aim for 30 minutes of exercise or brisk walking, 6-8 glasses of water, and 5 servings of fruits and vegetables each day.       This is a list of the screenings recommended for you:  Health Maintenance  Topic Date Due   COVID-19 Vaccine (3 - Moderna risk series) 09/24/2019   Flu Shot  02/05/2024   Colon Cancer Screening  08/13/2024*   Medicare Annual Wellness Visit  03/02/2025   DEXA scan (bone density measurement)  08/13/2025   DTaP/Tdap/Td vaccine (4 - Td or Tdap) 12/11/2029   Pneumococcal Vaccine for age over 62  Completed   Zoster (Shingles) Vaccine  Completed   HPV Vaccine  Aged Out   Meningitis B Vaccine  Aged Out   Hepatitis C Screening  Discontinued  *Topic was postponed. The date shown is not the original due date.    Advanced directives: (Declined) Advance directive discussed with you today. Even though you declined this today, please call our office should you change your mind, and we can give you the proper paperwork for you to fill out. Advance Care Planning is important because it:  [x]  Makes sure you receive the medical care that is consistent with your values, goals, and preferences  [x]  It provides  guidance to your family and loved ones and reduces their decisional burden about whether or not they are making the right decisions based on your wishes.  Follow the link provided in your after visit summary or read over the paperwork we have mailed to you to help you started getting your Advance Directives in place. If you need assistance in completing these, please reach out to us  so that we can help you!  See attachments for Preventive Care and Fall Prevention Tips.

## 2024-03-02 NOTE — Progress Notes (Signed)
 Subjective:   Abigail Moss is a 78 y.o. who presents for a Medicare Wellness preventive visit.  As a reminder, Annual Wellness Visits don't include a physical exam, and some assessments may be limited, especially if this visit is performed virtually. We may recommend an in-person follow-up visit with your provider if needed.  Visit Complete: Virtual I connected with  Abigail Moss on 03/02/24 by a audio enabled telemedicine application and verified that I am speaking with the correct person using two identifiers.  Patient Location: Home  Provider Location: Home Office  I discussed the limitations of evaluation and management by telemedicine. The patient expressed understanding and agreed to proceed.  Vital Signs: Because this visit was a virtual/telehealth visit, some criteria may be missing or patient reported. Any vitals not documented were not able to be obtained and vitals that have been documented are patient reported.  VideoDeclined- This patient declined Librarian, academic. Therefore the visit was completed with audio only.  Persons Participating in Visit: Patient.  AWV Questionnaire: No: Patient Medicare AWV questionnaire was not completed prior to this visit.  Cardiac Risk Factors include: advanced age (>63men, >33 women);obesity (BMI >30kg/m2)     Objective:    Today's Vitals   03/02/24 1419  BP: 134/86  Pulse: 74  Weight: 171 lb (77.6 kg)  Height: 5' 6 (1.676 m)   Body mass index is 27.6 kg/m.     03/02/2024    2:56 PM 02/26/2023    9:37 AM 01/27/2023    6:16 PM 05/03/2022    1:17 PM 02/24/2022    9:49 AM 02/21/2021   10:09 AM 04/30/2020   10:52 AM  Advanced Directives  Does Patient Have a Medical Advance Directive? No No No No No No No  Would patient like information on creating a medical advance directive?  Yes (MAU/Ambulatory/Procedural Areas - Information given)   No - Patient declined No - Patient declined     Current  Medications (verified) Outpatient Encounter Medications as of 03/02/2024  Medication Sig   Albuterol  Sulfate (PROAIR  RESPICLICK) 108 (90 Base) MCG/ACT AEPB Inhale 1-2 puffs into the lungs every 6 (six) hours as needed.   alendronate  (FOSAMAX ) 70 MG tablet TAKE 1 TABLET BY MOUTH WEEKLY  WITH 8 OZ OF PLAIN WATER 30  MINUTES BEFORE FIRST FOOD, DRINK OR MEDS. STAY UPRIGHT FOR 30  MINS   ALPRAZolam  (XANAX ) 0.5 MG tablet Take 1 tablet (0.5 mg total) by mouth at bedtime as needed.   amoxicillin -clavulanate (AUGMENTIN ) 875-125 MG tablet Take 1 tablet by mouth 2 (two) times daily. Take all of this medication   aspirin EC 81 MG tablet Take 81 mg by mouth daily.   buPROPion  (WELLBUTRIN  XL) 150 MG 24 hr tablet TAKE 1 TABLET BY MOUTH DAILY   Calcium  Citrate-Vitamin D  (CALCIUM  CITRATE + PO) Take 600 mg by mouth daily.   cetirizine  (ZYRTEC  ALLERGY) 10 MG tablet Take 1 tablet (10 mg total) by mouth daily.   Cholecalciferol (VITAMIN D -3 PO) Take 800 mg by mouth daily.   diclofenac  (VOLTAREN ) 75 MG EC tablet TAKE 1 TABLET BY MOUTH TWICE  DAILY AS NEEDED   fluticasone  (FLONASE ) 50 MCG/ACT nasal spray USE 2 SPRAYS IN BOTH NOSTRILS  DAILY   HYDROcodone-acetaminophen  (NORCO) 10-325 MG tablet Take 1 tablet by mouth 3 (three) times daily as needed.   levothyroxine  (SYNTHROID ) 150 MCG tablet Take 1 tablet (150 mcg total) by mouth daily before breakfast.   losartan  (COZAAR ) 100 MG tablet TAKE  1 TABLET BY MOUTH DAILY   methocarbamol  (ROBAXIN ) 500 MG tablet TAKE 1 TABLET BY MOUTH EVERY 8 (EIGHT) HOURS AS NEEDED FOR UP TO 7 DAYS FOR MUSCLE SPASMS.   montelukast  (SINGULAIR ) 10 MG tablet TAKE 1 TABLET BY MOUTH  DAILY AT BEDTIME   MULTIPLE VITAMIN PO Take by mouth.   ofloxacin  (FLOXIN ) 0.3 % OTIC solution Place 5 drops into both ears daily.   omeprazole  (PRILOSEC) 40 MG capsule TAKE 1 CAPSULE BY MOUTH DAILY   ondansetron  (ZOFRAN ) 4 MG tablet Take 1 tablet (4 mg total) by mouth every 8 (eight) hours as needed for nausea or  vomiting.   predniSONE  (DELTASONE ) 10 MG tablet Take 5 daily for 2 days followed by 4,3,2 and 1 for 2 days each.   rosuvastatin  (CRESTOR ) 5 MG tablet Take 1 tablet (5 mg total) by mouth daily.   No facility-administered encounter medications on file as of 03/02/2024.    Allergies (verified) Baclofen , Elemental sulfur, Gabapentin , Meloxicam , Neomycin , Sulfur, and Latex   History: Past Medical History:  Diagnosis Date   GERD (gastroesophageal reflux disease)    Hypertension    Osteopenia    Thyroid  disease    Past Surgical History:  Procedure Laterality Date   EAR TUBE REMOVAL     ROTATOR CUFF REPAIR Left    TONSILLECTOMY     VEIN LIGATION AND STRIPPING     Family History  Problem Relation Age of Onset   Heart disease Mother 28       CHF   Diabetes Father    Heart disease Sister        CHF   Cancer Sister        throat   Cancer Brother    Stroke Brother    COPD Brother    Breast cancer Neg Hx    Social History   Socioeconomic History   Marital status: Married    Spouse name: Nadara   Number of children: 2   Years of education: 12   Highest education level: High school graduate  Occupational History   Occupation: retired     Comment: worked at [employer]  Tobacco Use   Smoking status: Former    Current packs/day: 0.00    Types: Cigarettes    Quit date: 07/07/1996    Years since quitting: 27.6   Smokeless tobacco: Never  Vaping Use   Vaping status: Never Used  Substance and Sexual Activity   Alcohol use: No    Alcohol/week: 0.0 standard drinks of alcohol   Drug use: No   Sexual activity: Not Currently  Other Topics Concern   Not on file  Social History Narrative   Lives at home with husband.  Grandson stays with them a lot.   Children live nearby   Social Drivers of Health   Financial Resource Strain: Low Risk  (03/02/2024)   Overall Financial Resource Strain (CARDIA)    Difficulty of Paying Living Expenses: Not hard at all  Food Insecurity: No Food  Insecurity (03/02/2024)   Hunger Vital Sign    Worried About Running Out of Food in the Last Year: Never true    Ran Out of Food in the Last Year: Never true  Transportation Needs: No Transportation Needs (03/02/2024)   PRAPARE - Administrator, Civil Service (Medical): No    Lack of Transportation (Non-Medical): No  Physical Activity: Insufficiently Active (03/02/2024)   Exercise Vital Sign    Days of Exercise per Week: 3 days  Minutes of Exercise per Session: 30 min  Stress: No Stress Concern Present (03/02/2024)   [employer] of Occupational Health - Occupational Stress Questionnaire    Feeling of Stress: Not at all  Social Connections: Moderately Isolated (03/02/2024)   Social Connection and Isolation Panel    Frequency of Communication with Friends and Family: More than three times a week    Frequency of Social Gatherings with Friends and Family: More than three times a week    Attends Religious Services: Never    Database administrator or Organizations: No    Attends Engineer, structural: Never    Marital Status: Married    Tobacco Counseling Counseling given: Yes    Clinical Intake:  Pre-visit preparation completed: Yes  Pain : No/denies pain     BMI - recorded: 27.6 Nutritional Status: BMI 25 -29 Overweight Nutritional Risks: None Diabetes: No  No results found for: HGBA1C   How often do you need to have someone help you when you read instructions, pamphlets, or other written materials from your doctor or pharmacy?: 1 - Never  Interpreter Needed?: No  Information entered by :: alia t/cma   Activities of Daily Living     03/02/2024    2:23 PM  In your present state of health, do you have any difficulty performing the following activities:  Hearing? 1  Vision? 0  Difficulty concentrating or making decisions? 0  Walking or climbing stairs? 0  Dressing or bathing? 0  Doing errands, shopping? 1  Comment pt's grandson and  daughter  Quarry manager and eating ? N  Using the Toilet? N  In the past six months, have you accidently leaked urine? Y  Do you have problems with loss of bowel control? N  Managing your Medications? N  Managing your Finances? N  Housekeeping or managing your Housekeeping? N    Patient Care Team: Lavell Bari LABOR, FNP as PCP - General (Family Medicine) Obie Princella HERO, MD (Inactive) as Consulting Physician (Gastroenterology) Albina Maffucci, AUD (Audiology) Ladora Ross Lacy Phebe, MD as Referring Physician (Optometry)  I have updated your Care Teams any recent Medical Services you may have received from other providers in the past year.     Assessment:   This is a routine wellness examination for [place].  Hearing/Vision screen Hearing Screening - Comments:: Pt uses hearing aids Vision Screening - Comments:: Pt wear glasses/pt goes to [facility] in [place]/last ov 28yrs ago   Goals Addressed             This Visit's Progress    Exercise 3x per week (30 min per time)   On track    Walk for at least 30 minutes 3 times per week 02/24/22 - She hopes to get floors replaced in her trailer home       Depression Screen     03/02/2024    2:28 PM 11/18/2023    2:21 PM 08/14/2023   11:26 AM 08/14/2023   11:18 AM 06/08/2023   11:47 AM 05/12/2023   11:44 AM 04/27/2023   11:52 AM  PHQ 2/9 Scores  PHQ - 2 Score 0 0 0 0 0 0   PHQ- 9 Score  4 4 0 2 0   Exception Documentation       Patient refusal    Fall Risk     03/02/2024    2:22 PM 11/18/2023    2:21 PM 08/14/2023   11:18 AM 06/08/2023   11:47 AM 05/12/2023  11:51 AM  Fall Risk   Falls in the past year? 0 0 0 0 0  Number falls in past yr: 0 0 0    Injury with Fall? 0 0 0    Risk for fall due to : No Fall Risks No Fall Risks No Fall Risks    Follow up Falls evaluation completed Falls evaluation completed Falls evaluation completed      MEDICARE RISK AT HOME:  Medicare Risk at Home Any stairs in or around the home?: Yes If so, are  there any without handrails?: Yes Home free of loose throw rugs in walkways, pet beds, electrical cords, etc?: Yes Adequate lighting in your home to reduce risk of falls?: Yes Life alert?: No Use of a cane, walker or w/c?: No Grab bars in the bathroom?: Yes Shower chair or bench in shower?: Yes Elevated toilet seat or a handicapped toilet?: Yes  TIMED UP AND GO:  Was the test performed?  no  Cognitive Function: 6CIT completed    11/21/2014   10:33 AM  MMSE - Mini Mental State Exam  Orientation to time 5   Orientation to Place 5   Registration 3   Attention/ Calculation 5   Recall 3   Language- name 2 objects 2   Language- repeat 1  Language- follow 3 step command 3   Language- read & follow direction 1   Write a sentence 1   Copy design 1   Total score 30      Data saved with a previous flowsheet row definition        03/02/2024    2:29 PM 02/26/2023    9:37 AM 02/24/2022    9:51 AM 02/20/2020   10:22 AM 02/10/2019   10:18 AM  6CIT Screen  What Year? 0 points 0 points 0 points 0 points 0 points  What month? 0 points 0 points 0 points 0 points 0 points  What time? 0 points 0 points 0 points 0 points 0 points  Count back from 20 0 points 0 points 0 points 0 points 0 points  Months in reverse 0 points 0 points 0 points 0 points 0 points  Repeat phrase 0 points 0 points 0 points 0 points 0 points  Total Score 0 points 0 points 0 points 0 points 0 points    Immunizations Immunization History  Administered Date(s) Administered   Fluad Quad(high Dose 65+) 03/28/2020, 03/28/2022   Fluad Trivalent(High Dose 65+) 04/20/2023   INFLUENZA, HIGH DOSE SEASONAL PF 05/22/2018, 04/03/2019, 04/03/2019   Influenza Split 04/26/2009, 04/15/2010, 04/22/2011, 05/04/2012   Influenza,inj,Quad PF,6+ Mos 04/07/2013   Influenza-Unspecified 03/20/2016, 04/30/2017, 05/22/2018, 04/03/2019   Moderna Sars-Covid-2 Vaccination 07/30/2019, 08/27/2019   Novel Infuenza-h1n1-09 06/20/2008    Pneumococcal Conjugate-13 11/21/2014   Pneumococcal Polysaccharide-23 01/02/2011   Td 04/06/2006   Td,absorbed, Preservative Free, Adult Use, Lf Unspecified 04/06/2006   Tdap 12/12/2019   Zoster Recombinant(Shingrix ) 02/04/2022, 02/05/2023    Screening Tests Health Maintenance  Topic Date Due   COVID-19 Vaccine (3 - Moderna risk series) 09/24/2019   INFLUENZA VACCINE  02/05/2024   Colonoscopy  08/13/2024 (Originally 08/14/2023)   Medicare Annual Wellness (AWV)  03/02/2025   DEXA SCAN  08/13/2025   DTaP/Tdap/Td (4 - Td or Tdap) 12/11/2029   Pneumococcal Vaccine: 50+ Years  Completed   Zoster Vaccines- Shingrix   Completed   HPV VACCINES  Aged Out   Meningococcal B Vaccine  Aged Out   Hepatitis C Screening  Discontinued    Health  Maintenance  Health Maintenance Due  Topic Date Due   COVID-19 Vaccine (3 - Moderna risk series) 09/24/2019   INFLUENZA VACCINE  02/05/2024   Health Maintenance Items Addressed: See Nurse Notes at the end of this note  Additional Screening:  Vision Screening: Recommended annual ophthalmology exams for early detection of glaucoma and other disorders of the eye. Would you like a referral to an eye doctor? No    Dental Screening: Recommended annual dental exams for proper oral hygiene  Community Resource Referral / Chronic Care Management: CRR required this visit?  No   CCM required this visit?  No   Plan:    I have personally reviewed and noted the following in the patient's chart:   Medical and social history Use of alcohol, tobacco or illicit drugs  Current medications and supplements including opioid prescriptions. Patient is not currently taking opioid prescriptions. Functional ability and status Nutritional status Physical activity Advanced directives List of other physicians Hospitalizations, surgeries, and ER visits in previous 12 months Vitals Screenings to include cognitive, depression, and falls Referrals and  appointments  In addition, I have reviewed and discussed with patient certain preventive protocols, quality metrics, and best practice recommendations. A written personalized care plan for preventive services as well as general preventive health recommendations were provided to patient.   Ozie Ned, CMA   03/02/2024   After Visit Summary: (Declined) Due to this being a telephonic visit, with patients personalized plan was offered to patient but patient Declined AVS at this time   Notes: Nothing significant to report at this time.

## 2024-03-03 DIAGNOSIS — L97511 Non-pressure chronic ulcer of other part of right foot limited to breakdown of skin: Secondary | ICD-10-CM | POA: Diagnosis not present

## 2024-03-06 ENCOUNTER — Other Ambulatory Visit: Payer: Self-pay | Admitting: Family

## 2024-03-06 DIAGNOSIS — I10 Essential (primary) hypertension: Secondary | ICD-10-CM

## 2024-03-08 ENCOUNTER — Ambulatory Visit: Payer: Self-pay | Admitting: Family

## 2024-03-08 ENCOUNTER — Encounter: Payer: Self-pay | Admitting: Family

## 2024-03-08 ENCOUNTER — Ambulatory Visit: Admitting: Family

## 2024-03-08 VITALS — BP 126/82 | HR 81 | Temp 97.8°F | Ht 66.0 in | Wt 174.6 lb

## 2024-03-08 DIAGNOSIS — F1320 Sedative, hypnotic or anxiolytic dependence, uncomplicated: Secondary | ICD-10-CM

## 2024-03-08 DIAGNOSIS — Z23 Encounter for immunization: Secondary | ICD-10-CM

## 2024-03-08 DIAGNOSIS — I10 Essential (primary) hypertension: Secondary | ICD-10-CM

## 2024-03-08 DIAGNOSIS — E039 Hypothyroidism, unspecified: Secondary | ICD-10-CM | POA: Diagnosis not present

## 2024-03-08 DIAGNOSIS — Z0000 Encounter for general adult medical examination without abnormal findings: Secondary | ICD-10-CM

## 2024-03-08 DIAGNOSIS — M5441 Lumbago with sciatica, right side: Secondary | ICD-10-CM

## 2024-03-08 DIAGNOSIS — G8929 Other chronic pain: Secondary | ICD-10-CM

## 2024-03-08 DIAGNOSIS — F411 Generalized anxiety disorder: Secondary | ICD-10-CM

## 2024-03-08 DIAGNOSIS — K219 Gastro-esophageal reflux disease without esophagitis: Secondary | ICD-10-CM

## 2024-03-08 DIAGNOSIS — E663 Overweight: Secondary | ICD-10-CM

## 2024-03-08 DIAGNOSIS — Z0001 Encounter for general adult medical examination with abnormal findings: Secondary | ICD-10-CM | POA: Diagnosis not present

## 2024-03-08 DIAGNOSIS — M5416 Radiculopathy, lumbar region: Secondary | ICD-10-CM

## 2024-03-08 DIAGNOSIS — E559 Vitamin D deficiency, unspecified: Secondary | ICD-10-CM

## 2024-03-08 DIAGNOSIS — M810 Age-related osteoporosis without current pathological fracture: Secondary | ICD-10-CM

## 2024-03-08 DIAGNOSIS — Z79899 Other long term (current) drug therapy: Secondary | ICD-10-CM | POA: Diagnosis not present

## 2024-03-08 DIAGNOSIS — K5901 Slow transit constipation: Secondary | ICD-10-CM

## 2024-03-08 DIAGNOSIS — M51362 Other intervertebral disc degeneration, lumbar region with discogenic back pain and lower extremity pain: Secondary | ICD-10-CM

## 2024-03-08 DIAGNOSIS — R5383 Other fatigue: Secondary | ICD-10-CM

## 2024-03-08 DIAGNOSIS — R350 Frequency of micturition: Secondary | ICD-10-CM

## 2024-03-08 LAB — LIPID PANEL

## 2024-03-08 LAB — URINALYSIS, COMPLETE
Bilirubin, UA: NEGATIVE
Glucose, UA: NEGATIVE
Nitrite, UA: POSITIVE — AB
Specific Gravity, UA: 1.025 (ref 1.005–1.030)
Urobilinogen, Ur: 0.2 mg/dL (ref 0.2–1.0)
pH, UA: 5.5 (ref 5.0–7.5)

## 2024-03-08 LAB — MICROSCOPIC EXAMINATION
Epithelial Cells (non renal): NONE SEEN /HPF (ref 0–10)
RBC, Urine: >30 /HPF — AB (ref 0–2)
Renal Epithel, UA: NONE SEEN /HPF
WBC, UA: >30 /HPF — AB (ref 0–5)
Yeast, UA: NONE SEEN

## 2024-03-08 MED ORDER — MONTELUKAST SODIUM 10 MG PO TABS: ORAL_TABLET | ORAL | 2 refills | Status: AC

## 2024-03-08 MED ORDER — LOSARTAN POTASSIUM 100 MG PO TABS: 100.0000 mg | ORAL_TABLET | Freq: Every day | ORAL | 2 refills | Status: AC

## 2024-03-08 MED ORDER — OMEPRAZOLE 40 MG PO CPDR
40.0000 mg | DELAYED_RELEASE_CAPSULE | Freq: Every day | ORAL | 2 refills | Status: AC
Start: 2024-03-08 — End: ?

## 2024-03-08 MED ORDER — METHOCARBAMOL 500 MG PO TABS: ORAL_TABLET | ORAL | 2 refills | Status: DC

## 2024-03-08 MED ORDER — ALPRAZOLAM 0.5 MG PO TABS: 0.5000 mg | ORAL_TABLET | Freq: Every evening | ORAL | 3 refills | Status: AC | PRN

## 2024-03-08 MED ORDER — OFLOXACIN 0.3 % OT SOLN: 5.0000 [drp] | Freq: Every day | OTIC | 1 refills | Status: DC

## 2024-03-08 MED ORDER — CEPHALEXIN 500 MG PO CAPS: 500.0000 mg | ORAL_CAPSULE | Freq: Two times a day (BID) | ORAL | 0 refills | Status: DC

## 2024-03-08 NOTE — Progress Notes (Signed)
 Subjective:    Patient ID: Abigail Moss, female    DOB: April 11, 1946, 78 y.o.   MRN: [id number]  Chief Complaint  Patient presents with   Medical Management of Chronic Issues   Pt presents to the office today for CPE.   She is drinking Ensure as needed since COVID she has not been able to eat meats.     She has osteoporosis and takes Fosamax .  Last Dexa scan 08/14/23.    She fell and broke her right wrist on 05/03/22. Has chronic back pain and Followed by Ortho. She completed PT.  Hypertension This is a chronic problem. The current episode started more than 1 year ago. The problem has been resolved since onset. The problem is controlled. Associated symptoms include anxiety, malaise/fatigue and peripheral edema. Pertinent negatives include no shortness of breath. Risk factors for coronary artery disease include dyslipidemia and sedentary lifestyle. Past treatments include angiotensin blockers. The current treatment provides moderate improvement. Identifiable causes of hypertension include a thyroid  problem.  Constipation This is a chronic problem. The current episode started more than 1 year ago. The problem has been resolved since onset. Her stool frequency is 1 time per day. Associated symptoms include back pain. Pertinent negatives include no nausea or vomiting. She has tried laxatives for the symptoms. The treatment provided moderate relief.  Gastroesophageal Reflux She complains of belching and heartburn. She reports no hoarse voice or no nausea. This is a chronic problem. The current episode started more than 1 year ago. The problem occurs occasionally. The symptoms are aggravated by certain foods. Associated symptoms include fatigue. Risk factors include obesity. She has tried a PPI for the symptoms. The treatment provided moderate relief.  Thyroid  Problem Presents for follow-up visit. Symptoms include anxiety, constipation, dry skin and fatigue. Patient reports no hoarse voice. The  symptoms have been stable.  Back Pain This is a chronic problem. The current episode started more than 1 year ago. The problem occurs intermittently. The problem has been waxing and waning since onset. The pain is present in the lumbar spine. The quality of the pain is described as aching. The pain is at a severity of 5/10. The pain is moderate. She has tried analgesics for the symptoms. The treatment provided moderate relief.  Anxiety Presents for follow-up visit. Symptoms include excessive worry, nervous/anxious behavior and restlessness. Patient reports no nausea or shortness of breath. Symptoms occur most days (caring for her husband). The severity of symptoms is moderate.    Urinary Frequency  This is a recurrent problem. The current episode started in the past 7 days. The problem has been waxing and waning. The patient is experiencing no pain. Associated symptoms include frequency, hesitancy and urgency. Pertinent negatives include no nausea or vomiting. She has tried increased fluids for the symptoms. The treatment provided mild relief.      Review of Systems  Constitutional:  Positive for fatigue and malaise/fatigue.  HENT:  Negative for hoarse voice.   Respiratory:  Negative for shortness of breath.   Gastrointestinal:  Positive for constipation and heartburn. Negative for nausea and vomiting.  Genitourinary:  Positive for frequency, hesitancy and urgency.  Musculoskeletal:  Positive for back pain.  Psychiatric/Behavioral:  The patient is nervous/anxious.   All other systems reviewed and are negative.      Objective:   Physical Exam Vitals reviewed.  Constitutional:      General: She is not in acute distress.    Appearance: She is well-developed.  HENT:  Head: Normocephalic and atraumatic.     Right Ear: Tympanic membrane normal. Drainage present. A PE tube is present.     Left Ear: Tympanic membrane normal. Drainage present. A PE tube is present.  Eyes:     Pupils:  Pupils are equal, round, and reactive to light.  Neck:     Thyroid : No thyromegaly.  Cardiovascular:     Rate and Rhythm: Normal rate and regular rhythm.     Heart sounds: Normal heart sounds. No murmur heard. Pulmonary:     Effort: Pulmonary effort is normal. No respiratory distress.     Breath sounds: Normal breath sounds. No wheezing.  Abdominal:     General: Bowel sounds are normal. There is no distension.     Palpations: Abdomen is soft.     Tenderness: There is no abdominal tenderness.  Musculoskeletal:        General: No tenderness. Normal range of motion.     Cervical back: Normal range of motion and neck supple.     Right lower leg: Edema (trace) present.     Left lower leg: Edema (trace) present.  Skin:    General: Skin is warm and dry.  Neurological:     Mental Status: She is alert and oriented to person, place, and time.     Cranial Nerves: No cranial nerve deficit.     Motor: Weakness present.     Gait: Gait abnormal (using cane, lost of balance.).     Deep Tendon Reflexes: Reflexes are normal and symmetric.  Psychiatric:        Behavior: Behavior normal.        Thought Content: Thought content normal.        Judgment: Judgment normal.       BP 126/82   Pulse 81   Temp 97.8 F (36.6 C) (Temporal)   Ht 5' 6 (1.676 m)   Wt 174 lb 9.6 oz (79.2 kg)   SpO2 96%   BMI 28.18 kg/m      Assessment & Plan:   KELLYANN ORDWAY comes in today with chief complaint of Medical Management of Chronic Issues   Diagnosis and orders addressed:  1. GAD (generalized anxiety disorder) - ALPRAZolam  (XANAX ) 0.5 MG tablet; Take 1 tablet (0.5 mg total) by mouth at bedtime as needed.  Dispense: 30 tablet; Refill: 3 - ToxASSURE Select 13 (MW), Urine - CMP14+EGFR - CBC with Differential/Platelet  2. Benzodiazepine dependence (HCC) - ALPRAZolam  (XANAX ) 0.5 MG tablet; Take 1 tablet (0.5 mg total) by mouth at bedtime as needed.  Dispense: 30 tablet; Refill: 3 - ToxASSURE Select 13  (MW), Urine - CMP14+EGFR - CBC with Differential/Platelet  3. Controlled substance agreement signed - ALPRAZolam  (XANAX ) 0.5 MG tablet; Take 1 tablet (0.5 mg total) by mouth at bedtime as needed.  Dispense: 30 tablet; Refill: 3 - CMP14+EGFR - CBC with Differential/Platelet  4. Essential hypertension - losartan  (COZAAR ) 100 MG tablet; Take 1 tablet (100 mg total) by mouth daily.  Dispense: 100 tablet; Refill: 2 - CMP14+EGFR - CBC with Differential/Platelet  5. Degeneration of intervertebral disc of lumbar region with discogenic back pain and lower extremity pain - methocarbamol  (ROBAXIN ) 500 MG tablet; TAKE 1 TABLET BY MOUTH EVERY 8 (EIGHT) HOURS AS NEEDED FOR UP TO 7 DAYS FOR MUSCLE SPASMS.  Dispense: 90 tablet; Refill: 2 - CMP14+EGFR - CBC with Differential/Platelet  6. Lumbar radiculopathy - methocarbamol  (ROBAXIN ) 500 MG tablet; TAKE 1 TABLET BY MOUTH EVERY 8 (EIGHT) HOURS AS NEEDED FOR  UP TO 7 DAYS FOR MUSCLE SPASMS.  Dispense: 90 tablet; Refill: 2 - CMP14+EGFR - CBC with Differential/Platelet  7. Chronic right-sided low back pain with right-sided sciatica - methocarbamol  (ROBAXIN ) 500 MG tablet; TAKE 1 TABLET BY MOUTH EVERY 8 (EIGHT) HOURS AS NEEDED FOR UP TO 7 DAYS FOR MUSCLE SPASMS.  Dispense: 90 tablet; Refill: 2 - CMP14+EGFR - CBC with Differential/Platelet  8. Gastroesophageal reflux disease without esophagitis - omeprazole  (PRILOSEC) 40 MG capsule; Take 1 capsule (40 mg total) by mouth daily.  Dispense: 100 capsule; Refill: 2 - CMP14+EGFR - CBC with Differential/Platelet  9. Annual physical exam (Primary) - CMP14+EGFR - CBC with Differential/Platelet - Lipid panel - TSH - Vitamin B12 - VITAMIN D  25 Hydroxy (Vit-D Deficiency, Fractures)  10. Encounter for immunization - Flu vaccine HIGH DOSE PF(Fluzone Trivalent)  11. Primary hypertension - CMP14+EGFR - CBC with Differential/Platelet  12. Hypothyroidism, unspecified type - CMP14+EGFR - CBC with  Differential/Platelet - TSH  13. Vitamin D  deficiency - CMP14+EGFR - CBC with Differential/Platelet  14. Osteoporosis, unspecified osteoporosis type, unspecified pathological fracture presence - CMP14+EGFR - CBC with Differential/Platelet  15. Overweight (BMI 25.0-29.9) - CMP14+EGFR - CBC with Differential/Platelet  16. Slow transit constipation - CMP14+EGFR - CBC with Differential/Platelet  17. Other fatigue - CMP14+EGFR - CBC with Differential/Platelet - TSH - Vitamin B12 - VITAMIN D  25 Hydroxy (Vit-D Deficiency, Fractures)  18. Urinary frequency - Urinalysis, Complete    Labs pending Patient reviewed in [place] controlled database, no flags noted. Contract and drug screen are up to date.  Continue current medications  Health Maintenance reviewed Diet and exercise encouraged  Follow up plan: 3 months   Bari Learn, FNP

## 2024-03-08 NOTE — Patient Instructions (Signed)
 Fatigue If you have fatigue, you feel tired all the time and have a lack of energy or a lack of motivation. Fatigue may make it difficult to start or complete tasks because of exhaustion. Occasional or mild fatigue is often a normal response to activity or life. However, long-term (chronic) or extreme fatigue may be a symptom of a medical condition such as: Depression. Not having enough red blood cells or hemoglobin in the blood (anemia). A problem with a small gland located in the lower front part of the neck (thyroid disorder). Rheumatologic conditions. These are problems related to the body's defense system (immune system). Infections, especially certain viral infections. Fatigue can also lead to negative health outcomes over time. Follow these instructions at home: Medicines Take over-the-counter and prescription medicines only as told by your health care provider. Take a multivitamin if told by your health care provider. Do not use herbal or dietary supplements unless they are approved by your health care provider. Eating and drinking  Avoid heavy meals in the evening. Eat a well-balanced diet, which includes lean proteins, whole grains, plenty of fruits and vegetables, and low-fat dairy products. Avoid eating or drinking too many products with caffeine in them. Avoid alcohol. Drink enough fluid to keep your urine pale yellow. Activity  Exercise regularly, as told by your health care provider. Use or practice techniques to help you relax, such as yoga, tai chi, meditation, or massage therapy. Lifestyle Change situations that cause you stress. Try to keep your work and personal schedules in balance. Do not use recreational or illegal drugs. General instructions Monitor your fatigue for any changes. Go to bed and get up at the same time every day. Avoid fatigue by pacing yourself during the day and getting enough sleep at night. Maintain a healthy weight. Contact a health care  provider if: Your fatigue does not get better. You have a fever. You suddenly lose or gain weight. You have headaches. You have trouble falling asleep or sleeping through the night. You feel angry, guilty, anxious, or sad. You have swelling in your legs or another part of your body. Get help right away if: You feel confused, feel like you might faint, or faint. Your vision is blurry or you have a severe headache. You have severe pain in your abdomen, your back, or the area between your waist and hips (pelvis). You have chest pain, shortness of breath, or an irregular or fast heartbeat. You are unable to urinate, or you urinate less than normal. You have abnormal bleeding from the rectum, nose, lungs, nipples, or, if you are female, the vagina. You vomit blood. You have thoughts about hurting yourself or others. These symptoms may be an emergency. Get help right away. Call 911. Do not wait to see if the symptoms will go away. Do not drive yourself to the hospital. Get help right away if you feel like you may hurt yourself or others, or have thoughts about taking your own life. Go to your nearest emergency room or: Call 911. Call the National Suicide Prevention Lifeline at (262)721-8699 or 988. This is open 24 hours a day. Text the Crisis Text Line at 8450584327. Summary If you have fatigue, you feel tired all the time and have a lack of energy or a lack of motivation. Fatigue may make it difficult to start or complete tasks because of exhaustion. Long-term (chronic) or extreme fatigue may be a symptom of a medical condition. Exercise regularly, as told by your health care provider.  Change situations that cause you stress. Try to keep your work and personal schedules in balance. This information is not intended to replace advice given to you by your health care provider. Make sure you discuss any questions you have with your health care provider. Document Revised: 04/15/2021 Document  Reviewed: 04/15/2021 Elsevier Patient Education  2024 [employer].

## 2024-03-09 LAB — CMP14+EGFR
ALT: 17 IU/L (ref 0–32)
AST: 17 IU/L (ref 0–40)
Albumin: 3.8 g/dL (ref 3.8–4.8)
Alkaline Phosphatase: 66 IU/L (ref 44–121)
BUN/Creatinine Ratio: 21 (ref 12–28)
BUN: 22 mg/dL (ref 8–27)
Bilirubin Total: 0.3 mg/dL (ref 0.0–1.2)
CO2: 24 mmol/L (ref 20–29)
Calcium: 8.6 mg/dL — AB (ref 8.7–10.3)
Chloride: 108 mmol/L — AB (ref 96–106)
Creatinine, Ser: 1.07 mg/dL — AB (ref 0.57–1.00)
Globulin, Total: 2 g/dL (ref 1.5–4.5)
Glucose: 70 mg/dL (ref 70–99)
Potassium: 4.1 mmol/L (ref 3.5–5.2)
Sodium: 145 mmol/L — AB (ref 134–144)
Total Protein: 5.8 g/dL — AB (ref 6.0–8.5)
eGFR: 53 mL/min/1.73 — AB (ref >59)

## 2024-03-09 LAB — CBC WITH DIFFERENTIAL/PLATELET
Basophils Absolute: 0 x10E3/uL (ref 0.0–0.2)
Basos: 1 %
EOS (ABSOLUTE): 0.3 x10E3/uL (ref 0.0–0.4)
Eos: 4 %
Hematocrit: 35.1 % (ref 34.0–46.6)
Hemoglobin: 11.4 g/dL (ref 11.1–15.9)
Immature Grans (Abs): 0 x10E3/uL (ref 0.0–0.1)
Immature Granulocytes: 0 %
Lymphocytes Absolute: 1.5 x10E3/uL (ref 0.7–3.1)
Lymphs: 21 %
MCH: 31.9 pg (ref 26.6–33.0)
MCHC: 32.5 g/dL (ref 31.5–35.7)
MCV: 98 fL — ABNORMAL HIGH (ref 79–97)
Monocytes Absolute: 0.5 x10E3/uL (ref 0.1–0.9)
Monocytes: 6 %
Neutrophils Absolute: 5.1 x10E3/uL (ref 1.4–7.0)
Neutrophils: 68 %
Platelets: 256 x10E3/uL (ref 150–450)
RBC: 3.57 x10E6/uL — ABNORMAL LOW (ref 3.77–5.28)
RDW: 12.5 % (ref 11.7–15.4)
WBC: 7.5 x10E3/uL (ref 3.4–10.8)

## 2024-03-09 LAB — LIPID PANEL
Cholesterol, Total: 96 mg/dL — AB (ref 100–199)
HDL: 41 mg/dL (ref >39)
LDL CALC COMMENT:: 2.3 ratio (ref 0.0–4.4)
LDL Chol Calc (NIH): 38 mg/dL (ref 0–99)
Triglycerides: 87 mg/dL (ref 0–149)
VLDL Cholesterol Cal: 17 mg/dL (ref 5–40)

## 2024-03-09 LAB — TSH: TSH: 0.852 u[IU]/mL (ref 0.450–4.500)

## 2024-03-09 LAB — VITAMIN D 25 HYDROXY (VIT D DEFICIENCY, FRACTURES): Vit D, 25-Hydroxy: 65.1 ng/mL (ref 30.0–100.0)

## 2024-03-09 LAB — VITAMIN B12: Vitamin B-12: 285 pg/mL (ref 232–1245)

## 2024-03-10 LAB — TOXASSURE SELECT 13 (MW), URINE

## 2024-03-11 ENCOUNTER — Telehealth: Payer: Self-pay | Admitting: Family Medicine

## 2024-03-11 MED ORDER — CIPROFLOXACIN HCL 500 MG PO TABS: 500.0000 mg | ORAL_TABLET | Freq: Two times a day (BID) | ORAL | 0 refills | Status: DC

## 2024-03-11 NOTE — Telephone Encounter (Signed)
 Copied from CRM [id number]. Topic: Clinical - Lab/Test Results >> Mar 11, 2024  9:09 AM Abigail Moss wrote: Reason for CRM: Pt called in about Urine test and wanted to know if the culture was back, looked at results from 09/02 and per notes advised she was already told labs. But pt said culture was sent off and would like to know if the culture results are back.   Pt also stated she is taking medication and not working    Pls follow up with pt. [phone]

## 2024-03-11 NOTE — Telephone Encounter (Signed)
 Patient aware and verbalized understanding.

## 2024-03-11 NOTE — Telephone Encounter (Signed)
 For some reason urine culture not ordered. Stop Keflex  and start cipro . Prescription sent to pharmacy

## 2024-03-11 NOTE — Telephone Encounter (Signed)
 Called patient she is no better started the abx Tuesday and she still has the same symptoms. Of frequency and urgency can she get a change in abx uses [facility].

## 2024-04-07 DIAGNOSIS — L97511 Non-pressure chronic ulcer of other part of right foot limited to breakdown of skin: Secondary | ICD-10-CM | POA: Diagnosis not present

## 2024-04-07 DIAGNOSIS — M79674 Pain in right toe(s): Secondary | ICD-10-CM | POA: Diagnosis not present

## 2024-04-12 ENCOUNTER — Other Ambulatory Visit: Payer: Self-pay | Admitting: Family

## 2024-04-12 DIAGNOSIS — M810 Age-related osteoporosis without current pathological fracture: Secondary | ICD-10-CM

## 2024-04-14 DIAGNOSIS — M5416 Radiculopathy, lumbar region: Secondary | ICD-10-CM | POA: Diagnosis not present

## 2024-04-19 ENCOUNTER — Telehealth: Admitting: Nurse Practitioner

## 2024-04-19 ENCOUNTER — Encounter: Payer: Self-pay | Admitting: Nurse Practitioner

## 2024-04-19 ENCOUNTER — Other Ambulatory Visit: Payer: Self-pay

## 2024-04-19 DIAGNOSIS — R300 Dysuria: Secondary | ICD-10-CM

## 2024-04-19 LAB — URINALYSIS, ROUTINE W REFLEX MICROSCOPIC
Bilirubin, UA: NEGATIVE
Glucose, UA: NEGATIVE
Ketones, UA: NEGATIVE
Leukocytes,UA: NEGATIVE
Nitrite, UA: NEGATIVE
Protein,UA: NEGATIVE
RBC, UA: NEGATIVE
Specific Gravity, UA: 1.01 (ref 1.005–1.030)
Urobilinogen, Ur: 0.2 mg/dL (ref 0.2–1.0)
pH, UA: 6.5 (ref 5.0–7.5)

## 2024-04-19 LAB — MICROSCOPIC EXAMINATION
Bacteria, UA: NONE SEEN
Epithelial Cells (non renal): NONE SEEN /HPF (ref 0–10)
RBC, Urine: NONE SEEN /HPF (ref 0–2)
Renal Epithel, UA: NONE SEEN /HPF
WBC, UA: NONE SEEN /HPF (ref 0–5)
Yeast, UA: NONE SEEN

## 2024-04-19 NOTE — Progress Notes (Signed)
 Virtual Visit Consent   Abigail Moss, you are scheduled for a virtual visit with Mary-Margaret Gladis, FNP, a [facility] provider, today.     Just as with appointments in the office, your consent must be obtained to participate.  Your consent will be active for this visit and any virtual visit you may have with one of our providers in the next 365 days.     If you have a MyChart account, a copy of this consent can be sent to you electronically.  All virtual visits are billed to your insurance company just like a traditional visit in the office.    As this is a virtual visit, video technology does not allow for your provider to perform a traditional examination.  This may limit your provider's ability to fully assess your condition.  If your provider identifies any concerns that need to be evaluated in person or the need to arrange testing (such as labs, EKG, etc.), we will make arrangements to do so.     Although advances in technology are sophisticated, we cannot ensure that it will always work on either your end or our end.  If the connection with a video visit is poor, the visit may have to be switched to a telephone visit.  With either a video or telephone visit, we are not always able to ensure that we have a secure connection.     I need to obtain your verbal consent now.   Are you willing to proceed with your visit today? YES   Abigail Moss has provided verbal consent on 04/19/2024 for a virtual visit (video or telephone).   Mary-Margaret Gladis, FNP   Date: 04/19/2024 10:29 AM   Virtual Visit via Video Note   I, Mary-Margaret Gladis, connected with Abigail Moss ([id number], 11/15/1945) on 04/19/24 at 10:50 AM EDT by a video-enabled telemedicine application and verified that I am speaking with the correct person using two identifiers.  Location: Patient: Virtual Visit Location Patient: Home Provider: Virtual Visit Location Provider: Mobile   I discussed the limitations of  evaluation and management by telemedicine and the availability of in person appointments. The patient expressed understanding and agreed to proceed.    History of Present Illness: Abigail Moss is a 78 y.o. who identifies as a female who was assigned female at birth, and is being seen today for dysuria.  HPI: Patient states she had a UTI a few weeks ago , but no culture was done. Wonder if did not completely resolve.   Dysuria  This is a new problem. The current episode started in the past 7 days. The problem occurs intermittently. The problem has been gradually worsening. The quality of the pain is described as aching. The pain is at a severity of 5/10. The pain is moderate. There has been no fever. She is Not sexually active. There is No history of pyelonephritis. Associated symptoms include frequency and urgency. Pertinent negatives include no flank pain or hesitancy. She has tried nothing for the symptoms.    Review of Systems  Genitourinary:  Positive for dysuria, frequency and urgency. Negative for flank pain and hesitancy.    Problems:  Patient Active Problem List   Diagnosis Date Noted   Slow transit constipation 02/04/2022   Lumbar radiculopathy 09/30/2021   Degeneration of lumbar intervertebral disc 04/20/2020   Benzodiazepine dependence (HCC) 07/15/2018   Controlled substance agreement signed 07/15/2018   GAD (generalized anxiety disorder) 12/18/2015   Overweight (BMI 25.0-29.9)  12/18/2015   Hypothyroidism 12/11/2014   Allergic rhinitis 12/11/2014   Vitamin D  deficiency 12/11/2014   Osteoporosis 12/11/2014   HTN (hypertension) 12/08/2012   GERD (gastroesophageal reflux disease) 12/08/2012    Allergies:  Allergies  Allergen Reactions   Baclofen     Elemental Sulfur     REACTION: itching   Gabapentin     Meloxicam     Neomycin      REACTION: itching   Sulfur Other (See Comments)    sulfur   Latex Rash   Medications:  Current Outpatient Medications:    ciprofloxacin   (CIPRO ) 500 MG tablet, Take 1 tablet (500 mg total) by mouth 2 (two) times daily., Disp: 10 tablet, Rfl: 0   Albuterol  Sulfate (PROAIR  RESPICLICK) 108 (90 Base) MCG/ACT AEPB, Inhale 1-2 puffs into the lungs every 6 (six) hours as needed., Disp: 1 each, Rfl: 1   alendronate  (FOSAMAX ) 70 MG tablet, TAKE 1 TABLET BY MOUTH WEEKLY  WITH 8 OZ OF PLAIN WATER 30  MINUTES BEFORE FIRST FOOD, DRINK OR MEDS. STAY UPRIGHT FOR 30  MINS, Disp: 12 tablet, Rfl: 0   ALPRAZolam  (XANAX ) 0.5 MG tablet, Take 1 tablet (0.5 mg total) by mouth at bedtime as needed., Disp: 30 tablet, Rfl: 3   aspirin EC 81 MG tablet, Take 81 mg by mouth daily., Disp: , Rfl:    buPROPion  (WELLBUTRIN  XL) 150 MG 24 hr tablet, TAKE 1 TABLET BY MOUTH DAILY, Disp: 90 tablet, Rfl: 3   Calcium  Citrate-Vitamin D  (CALCIUM  CITRATE + PO), Take 600 mg by mouth daily., Disp: , Rfl:    cephALEXin  (KEFLEX ) 500 MG capsule, Take 1 capsule (500 mg total) by mouth 2 (two) times daily., Disp: 14 capsule, Rfl: 0   cetirizine  (ZYRTEC  ALLERGY) 10 MG tablet, Take 1 tablet (10 mg total) by mouth daily., Disp: 90 tablet, Rfl: 1   Cholecalciferol (VITAMIN D -3 PO), Take 800 mg by mouth daily., Disp: , Rfl:    diclofenac  (VOLTAREN ) 75 MG EC tablet, TAKE 1 TABLET BY MOUTH TWICE  DAILY AS NEEDED, Disp: 200 tablet, Rfl: 2   fluticasone  (FLONASE ) 50 MCG/ACT nasal spray, USE 2 SPRAYS IN BOTH NOSTRILS  DAILY, Disp: 48 g, Rfl: 1   HYDROcodone-acetaminophen  (NORCO) 10-325 MG tablet, Take 1 tablet by mouth 3 (three) times daily as needed., Disp: , Rfl:    levothyroxine  (SYNTHROID ) 150 MCG tablet, Take 1 tablet (150 mcg total) by mouth daily before breakfast., Disp: 90 tablet, Rfl: 2   losartan  (COZAAR ) 100 MG tablet, Take 1 tablet (100 mg total) by mouth daily., Disp: 100 tablet, Rfl: 2   methocarbamol  (ROBAXIN ) 500 MG tablet, TAKE 1 TABLET BY MOUTH EVERY 8 (EIGHT) HOURS AS NEEDED FOR UP TO 7 DAYS FOR MUSCLE SPASMS., Disp: 90 tablet, Rfl: 2   montelukast  (SINGULAIR ) 10 MG tablet,  TAKE 1 TABLET BY MOUTH  DAILY AT BEDTIME, Disp: 100 tablet, Rfl: 2   MULTIPLE VITAMIN PO, Take by mouth., Disp: , Rfl:    ofloxacin  (FLOXIN ) 0.3 % OTIC solution, Place 5 drops into both ears daily., Disp: 5 mL, Rfl: 1   omeprazole  (PRILOSEC) 40 MG capsule, Take 1 capsule (40 mg total) by mouth daily., Disp: 100 capsule, Rfl: 2   rosuvastatin  (CRESTOR ) 5 MG tablet, Take 1 tablet (5 mg total) by mouth daily., Disp: 90 tablet, Rfl: 3  Observations/Objective: Patient is well-developed, well-nourished in no acute distress.  Resting comfortably  at home.  Head is normocephalic, atraumatic.  No labored breathing.  Speech is clear and coherent with logical  content.  Patient is alert and oriented at baseline.  No back pain No pelvic pain Urine brought in was clear Assessment and Plan:  Abigail Moss in today with chief complaint of No chief complaint on file.   1. Dysuria Force fluids Culture pending Patient informed that urine was clear - Urine Culture - Urinalysis, Routine w reflex microscopic   Follow Up Instructions: I discussed the assessment and treatment plan with the patient. The patient was provided an opportunity to ask questions and all were answered. The patient agreed with the plan and demonstrated an understanding of the instructions.  A copy of instructions were sent to the patient via MyChart.  The patient was advised to call back or seek an in-person evaluation if the symptoms worsen or if the condition fails to improve as anticipated.  Time:  I spent 12 minutes with the patient via telehealth technology discussing the above problems/concerns.    Mary-Margaret Gladis, FNP

## 2024-04-21 ENCOUNTER — Ambulatory Visit: Payer: Self-pay | Admitting: Nurse Practitioner

## 2024-04-21 LAB — URINE CULTURE

## 2024-05-11 ENCOUNTER — Encounter

## 2024-05-23 ENCOUNTER — Ambulatory Visit

## 2024-05-23 ENCOUNTER — Ambulatory Visit: Payer: Self-pay

## 2024-05-23 ENCOUNTER — Encounter: Payer: Self-pay | Admitting: Family

## 2024-05-23 ENCOUNTER — Ambulatory Visit (INDEPENDENT_AMBULATORY_CARE_PROVIDER_SITE_OTHER): Admitting: Family

## 2024-05-23 VITALS — BP 117/72 | HR 81 | Temp 97.8°F | Ht 66.0 in | Wt 165.0 lb

## 2024-05-23 DIAGNOSIS — R6889 Other general symptoms and signs: Secondary | ICD-10-CM | POA: Diagnosis not present

## 2024-05-23 DIAGNOSIS — R0602 Shortness of breath: Secondary | ICD-10-CM

## 2024-05-23 DIAGNOSIS — R051 Acute cough: Secondary | ICD-10-CM

## 2024-05-23 DIAGNOSIS — R531 Weakness: Secondary | ICD-10-CM

## 2024-05-23 DIAGNOSIS — J208 Acute bronchitis due to other specified organisms: Secondary | ICD-10-CM

## 2024-05-23 DIAGNOSIS — Z1152 Encounter for screening for COVID-19: Secondary | ICD-10-CM

## 2024-05-23 DIAGNOSIS — B9689 Other specified bacterial agents as the cause of diseases classified elsewhere: Secondary | ICD-10-CM

## 2024-05-23 MED ORDER — DOXYCYCLINE HYCLATE 100 MG PO CAPS: 100.0000 mg | ORAL_CAPSULE | Freq: Two times a day (BID) | ORAL | 0 refills | Status: DC

## 2024-05-23 MED ORDER — PREDNISONE 10 MG (21) PO TBPK: ORAL_TABLET | ORAL | 0 refills | Status: DC

## 2024-05-23 NOTE — Progress Notes (Signed)
 Subjective:    Patient ID: Abigail Moss, female    DOB: 03/14/1946, 78 y.o.   MRN: [id number]  Chief Complaint  Patient presents with   Shortness of Breath   Cough   PT presents to the office today with cough that started a few days ago, but worsen yesterday.  Shortness of Breath Associated symptoms include a fever and headaches. Pertinent negatives include no ear pain or wheezing.  Cough This is a new problem. The current episode started yesterday. The problem has been unchanged. The problem occurs every few minutes. The cough is Non-productive. Associated symptoms include a fever, headaches, myalgias, nasal congestion, postnasal drip and shortness of breath. Pertinent negatives include no ear congestion, ear pain or wheezing. She has tried OTC cough suppressant and rest for the symptoms. The treatment provided mild relief.      Review of Systems  Constitutional:  Positive for fever.  HENT:  Positive for postnasal drip. Negative for ear pain.   Respiratory:  Positive for cough and shortness of breath. Negative for wheezing.   Musculoskeletal:  Positive for myalgias.  Neurological:  Positive for headaches.  All other systems reviewed and are negative.   Social History   Socioeconomic History   Marital status: Married    Spouse name: Nadara   Number of children: 2   Years of education: 12   Highest education level: High school graduate  Occupational History   Occupation: retired     Comment: worked at [employer]  Tobacco Use   Smoking status: Former    Current packs/day: 0.00    Types: Cigarettes    Quit date: 07/07/1996    Years since quitting: 27.8   Smokeless tobacco: Never  Vaping Use   Vaping status: Never Used  Substance and Sexual Activity   Alcohol use: No    Alcohol/week: 0.0 standard drinks of alcohol   Drug use: No   Sexual activity: Not Currently  Other Topics Concern   Not on file  Social History Narrative   Lives at home with husband.  Grandson stays with  them a lot.   Children live nearby   Social Drivers of Health   Financial Resource Strain: Low Risk  (03/02/2024)   Overall Financial Resource Strain (CARDIA)    Difficulty of Paying Living Expenses: Not hard at all  Food Insecurity: No Food Insecurity (03/02/2024)   Hunger Vital Sign    Worried About Running Out of Food in the Last Year: Never true    Ran Out of Food in the Last Year: Never true  Transportation Needs: No Transportation Needs (03/02/2024)   PRAPARE - Administrator, Civil Service (Medical): No    Lack of Transportation (Non-Medical): No  Physical Activity: Insufficiently Active (03/02/2024)   Exercise Vital Sign    Days of Exercise per Week: 3 days    Minutes of Exercise per Session: 30 min  Stress: No Stress Concern Present (03/02/2024)   [employer] of Occupational Health - Occupational Stress Questionnaire    Feeling of Stress: Not at all  Social Connections: Moderately Isolated (03/02/2024)   Social Connection and Isolation Panel    Frequency of Communication with Friends and Family: More than three times a week    Frequency of Social Gatherings with Friends and Family: More than three times a week    Attends Religious Services: Never    Database Administrator or Organizations: No    Attends Banker Meetings: Never  Marital Status: Married   Family History  Problem Relation Age of Onset   Heart disease Mother 54       CHF   Diabetes Father    Heart disease Sister        CHF   Cancer Sister        throat   Cancer Brother    Stroke Brother    COPD Brother    Breast cancer Neg Hx         Objective:   Physical Exam Vitals reviewed.  Constitutional:      General: She is not in acute distress.    Appearance: She is well-developed.  HENT:     Head: Normocephalic and atraumatic.     Right Ear: Tympanic membrane normal.     Left Ear: Tympanic membrane normal.  Eyes:     Pupils: Pupils are equal, round, and reactive to  light.  Neck:     Thyroid : No thyromegaly.  Cardiovascular:     Rate and Rhythm: Normal rate and regular rhythm.     Heart sounds: Normal heart sounds. No murmur heard. Pulmonary:     Effort: Pulmonary effort is normal. No respiratory distress.     Breath sounds: Decreased breath sounds present. No wheezing.  Abdominal:     General: Bowel sounds are normal. There is no distension.     Palpations: Abdomen is soft.     Tenderness: There is no abdominal tenderness.  Musculoskeletal:        General: No tenderness. Normal range of motion.     Cervical back: Normal range of motion and neck supple.  Skin:    General: Skin is warm and dry.  Neurological:     Mental Status: She is alert and oriented to person, place, and time.     Cranial Nerves: No cranial nerve deficit.     Deep Tendon Reflexes: Reflexes are normal and symmetric.  Psychiatric:        Behavior: Behavior normal.        Thought Content: Thought content normal.        Judgment: Judgment normal.       BP 117/72   Pulse 81   Temp 97.8 F (36.6 C) (Temporal)   Ht 5' 6 (1.676 m)   Wt 165 lb (74.8 kg)   SpO2 94%   BMI 26.63 kg/m      Assessment & Plan:  CLYTIE SHETLEY comes in today with chief complaint of Shortness of Breath and Cough   Diagnosis and orders addressed:  1. Flu-like symptoms (Primary) - Veritor SARS-CoV-2 and Flu A+B  2. Encounter for screening for COVID-19 - Veritor SARS-CoV-2 and Flu A+B  3. SOB (shortness of breath) - DG Chest 2 View; Future  4. Acute cough - DG Chest 2 View; Future  5. Weakness - Urinalysis, Complete - Urine Culture  6. Acute bacterial bronchitis Start doxycyline and prednisone  today - Use a cool mist humidifier  -Use saline nose sprays frequently -Force fluids -For any cough or congestion  Use plain Mucinex - regular strength or max strength is fine -For fever or aces or pains- take tylenol  or ibuprofen. -Throat lozenges if help -Follow up if symptoms worsen  or do not improve  - doxycycline  (VIBRAMYCIN ) 100 MG capsule; Take 1 capsule (100 mg total) by mouth 2 (two) times daily.  Dispense: 20 capsule; Refill: 0 - predniSONE  (STERAPRED UNI-PAK 21 TAB) 10 MG (21) TBPK tablet; Use as directed  Dispense: 21 tablet; Refill:  0      Bari Learn, [place]

## 2024-05-23 NOTE — Telephone Encounter (Signed)
 FYI Only or Action Required?: FYI only for provider: appointment scheduled on 05/23/24.  Patient was last seen in primary care on 04/19/2024 by Gladis Mustard, FNP.  Called Nurse Triage reporting No chief complaint on file..  Symptoms began yesterday.  Interventions attempted: Prescription medications: albuterol .  Symptoms are: unchanged.  Triage Disposition: See HCP Within 4 Hours (Or PCP Triage)  Patient/caregiver understands and will follow disposition?: Yes Reason for Disposition  [1] MILD difficulty breathing (e.g., minimal/no SOB at rest, SOB with walking, pulse < 100) AND [2] NEW-onset or WORSE than normal  Answer Assessment - Initial Assessment Questions Pt denies lung disease but has singulair  and albuterol . Believes she has them for allergies. States she is experiencing fatigue, coughing, sob and decreased SPO2 x 2 days. Before albuterol , SPO2 was 85%. After albuterol , SPO2 was 94%. Currently 94% while on the phone with this RN. Pt is speaking in clear and full sentences. Denies wheezing or extremity edema.  She is already scheduled for the soonest available same day visit. ED precautions reviewed, pt verbalized understanding.   1. RESPIRATORY STATUS: Describe your breathing? (e.g., wheezing, shortness of breath, unable to speak, severe coughing)      Denies wheezing 2. ONSET: When did this breathing problem begin?      Yesterday 3. PATTERN Does the difficult breathing come and go, or has it been constant since it started?      Constant 4. SEVERITY: How bad is your breathing? (e.g., mild, moderate, severe)      Moderate, improves with albuterol  5. RECURRENT SYMPTOM: Have you had difficulty breathing before? If Yes, ask: When was the last time? and What happened that time?      States  6. CARDIAC HISTORY: Do you have any history of heart disease? (e.g., heart attack, angina, bypass surgery, angioplasty)      Denies 7. LUNG HISTORY: Do you have any  history of lung disease?  (e.g., pulmonary embolus, asthma, emphysema)     Denies, but has rx for albuterol  and singular. States is for allergies. 8. CAUSE: What do you think is causing the breathing problem?      Pt states she believes it was triggered by allergies. 9. OTHER SYMPTOMS: Do you have any other symptoms? (e.g., chest pain, cough, dizziness, fever, runny nose)     Denies 10. O2 SATURATION MONITOR:  Do you use an oxygen saturation monitor (pulse oximeter) at home? If Yes, ask: What is your reading (oxygen level) today? What is your usual oxygen saturation reading? (e.g., 95%)       See above  Protocols used: Breathing Difficulty-A-AH   Copied from CRM #[id number]. Topic: Clinical - Red Word Triage >> May 23, 2024 12:53 PM Teressa P wrote: Red Word that prompted transfer to Nurse Triage: SOB  oxygen level id 85  Past Medical History:  Diagnosis Date   GERD (gastroesophageal reflux disease)    Hypertension    Osteopenia    Thyroid  disease

## 2024-05-23 NOTE — Telephone Encounter (Signed)
 APPT MADE

## 2024-05-23 NOTE — Patient Instructions (Signed)

## 2024-05-24 LAB — URINALYSIS, COMPLETE
Bilirubin, UA: NEGATIVE
Glucose, UA: NEGATIVE
Ketones, UA: NEGATIVE
Leukocytes,UA: NEGATIVE
Nitrite, UA: NEGATIVE
Protein,UA: NEGATIVE
Specific Gravity, UA: 1.02 (ref 1.005–1.030)
Urobilinogen, Ur: 0.2 mg/dL (ref 0.2–1.0)
pH, UA: 6 (ref 5.0–7.5)

## 2024-05-24 LAB — VERITOR SARS-COV-2 AND FLU A+B
BD Veritor SARS-CoV-2 Ag: NEGATIVE
Influenza A: NEGATIVE
Influenza B: NEGATIVE

## 2024-05-25 LAB — URINE CULTURE

## 2024-05-26 ENCOUNTER — Ambulatory Visit: Payer: Self-pay | Admitting: Family

## 2024-06-14 ENCOUNTER — Ambulatory Visit: Payer: Self-pay | Admitting: Family

## 2024-06-30 ENCOUNTER — Other Ambulatory Visit: Payer: Self-pay | Admitting: Family

## 2024-06-30 DIAGNOSIS — M810 Age-related osteoporosis without current pathological fracture: Secondary | ICD-10-CM

## 2024-07-03 ENCOUNTER — Other Ambulatory Visit: Payer: Self-pay | Admitting: Family

## 2024-07-03 DIAGNOSIS — J301 Allergic rhinitis due to pollen: Secondary | ICD-10-CM

## 2024-07-21 ENCOUNTER — Ambulatory Visit: Payer: Self-pay

## 2024-07-21 NOTE — Telephone Encounter (Signed)
 FYI Only or Action Required?: Action required by provider: Please call patient, refusing to be seen today in office or Urgent Care due to lack of transportation.  Patient was last seen in primary care on 05/23/2024 by Lavell Bari LABOR, FNP.  Called Nurse Triage reporting Fatigue and Shortness of Breath.  Symptoms began several weeks ago.  Interventions attempted: Rest, hydration, or home remedies.  Symptoms are: stable.  Triage Disposition: See HCP Within 4 Hours (Or PCP Triage)  Patient/caregiver understands and will follow disposition?: No, wishes to speak with PCP   Notified CAL @ 1400, Montie will notify staff.    Copied from CRM [id number]. Topic: Clinical - Red Word Triage >> Jul 21, 2024  1:45 PM Willma R wrote: Red Word that prompted transfer to Nurse Triage: Patient states over the last couple of weeks has been experiencing a lot of weakness, doesn't feel like doing anything. Sometimes feels Short of Breath but says she checks her oxygen and its always good.    Reason for Disposition  [1] MILD difficulty breathing (e.g., minimal/no SOB at rest, SOB with walking, pulse < 100) AND [2] NEW-onset or WORSE than normal  Additional Information  Negative: Difficulty breathing    See alternative assessment.  Answer Assessment - Initial Assessment Questions 1. DESCRIPTION: Describe how you are feeling.     Mild weakness/ fatigue  2. SEVERITY: How bad is it?  Can you stand and walk?     Mild  3. ONSET: When did these symptoms begin? (e.g., hours, days, weeks, months)     2 weeks ago  4. CAUSE: What do you think is causing the weakness or fatigue? (e.g., not drinking enough fluids, medical problem, trouble sleeping)     Thryoid  5. NEW MEDICINES:  Have you started on any new medicines recently? (e.g., opioid pain medicines, benzodiazepines, muscle relaxants, antidepressants, antihistamines, neuroleptics, beta blockers)     Denies  6. OTHER SYMPTOMS: Do you  have any other symptoms? (e.g., chest pain, fever, cough, SOB, vomiting, diarrhea, bleeding, other areas of pain)     Mild shortness of breath  7. EXPOSURES: Lives in a mobile home.  No visible mold growth, no known water leaks or trouble with humidity patient is aware of.  Denies musty smell.  Answer Assessment - Initial Assessment Questions 1. RESPIRATORY STATUS: Describe your breathing? (e.g., wheezing, shortness of breath, unable to speak, severe coughing)      Mild difficulty (winded) with activity only, none currently while on phone  2. ONSET: When did this breathing problem begin?      2 weeks ago  3. PATTERN Does the difficult breathing come and go, or has it been constant since it started?      Comes/goes with activity  4. SEVERITY: How bad is your breathing? (e.g., mild, moderate, severe)      Winded- mild, doesn't last long, happens when bending over or moving too much  5. RECURRENT SYMPTOM: Have you had difficulty breathing before? If Yes, ask: When was the last time? and What happened that time?      No past history  6. CARDIAC HISTORY: Do you have any history of heart disease? (e.g., heart attack, angina, bypass surgery, angioplasty)      Denies chest pain or palpitations/ fluttering. Denies any extremity swelling. Denies history.   7. LUNG HISTORY: Do you have any history of lung disease?  (e.g., pulmonary embolus, asthma, emphysema)     Denies  8. CAUSE: What do you think is  causing the breathing problem?      Mentions thyroid  issue in past  9. OTHER SYMPTOMS: Do you have any other symptoms? (e.g., chest pain, cough, dizziness, fever, runny nose)     Denies hx of bleeding, sometimes sees blood on toilet paper and reports due to hemorrhoids, denies any blood in toilet or black/tarry stools;  hx of sciatica, due for epidural, wants to get checked out; denies dizzy or light-headed  10. O2 SATURATION MONITOR:  Do you use an oxygen saturation  monitor (pulse oximeter) at home? If Yes, ask: What is your reading (oxygen level) today? What is your usual oxygen saturation reading? (e.g., 95%)       95-98% when feeling winded (Room Air)   12. TRAVEL: Have you traveled out of the country in the last month? (e.g., travel history, exposures)       No recent travel or history of blood clots.   BP recently lower than usual, last checked was 101/69.  Tends to go lower at night.  Protocols used: Weakness (Generalized) and Fatigue-A-AH, Breathing Difficulty-A-AH

## 2024-07-21 NOTE — Telephone Encounter (Signed)
 Called and spoke with the patient she just wanted an appointment made to check her thyroid . Made patient an appointment for next week that works with her transportation issues.

## 2024-07-22 ENCOUNTER — Ambulatory Visit: Payer: Self-pay

## 2024-07-22 NOTE — Telephone Encounter (Signed)
 FYI Only or Action Required?: FYI only for provider: appointment scheduled on 1/20.  Patient was last seen in primary care on 05/23/2024 by Lavell Bari LABOR, FNP.  Called Nurse Triage reporting Shortness of Breath.  Symptoms began several weeks ago.  Interventions attempted: Nothing.  Symptoms are: stable.  Triage Disposition: See PCP Within 2 Weeks  Patient/caregiver understands and will follow disposition?: Yes         Message from Lonell PEDLAR sent at 07/22/2024  9:25 AM EST  Reason for Triage: Patient reports fatigue, occasional shortness of breath   Reason for Disposition  [1] MILD longstanding difficulty breathing (e.g., minimal/no SOB at rest, SOB with walking, pulse < 100) AND [2] SAME as normal  Answer Assessment - Initial Assessment Questions 1. RESPIRATORY STATUS: Describe your breathing? (e.g., wheezing, shortness of breath, unable to speak, severe coughing)      SOB, on exertion.  Not current during call. Intermittent with exertion, she reports it does Not happen often.   2. ONSET: When did this breathing problem begin?      X 2 weeks   3. PATTERN Does the difficult breathing come and go, or has it been constant since it started?      Intermittent   4. SEVERITY: How bad is your breathing? (e.g., mild, moderate, severe)      Mild   5. RECURRENT SYMPTOM: Have you had difficulty breathing before? If Yes, ask: When was the last time? and What happened that time?      No   6. CARDIAC HISTORY: Do you have any history of heart disease? (e.g., heart attack, angina, bypass surgery, angioplasty)      HTN  7. LUNG HISTORY: Do you have any history of lung disease?  (e.g., pulmonary embolus, asthma, emphysema)     No  8. CAUSE: What do you think is causing the breathing problem?      Unsure   9. OTHER SYMPTOMS: Do you have any other symptoms? (e.g., chest pain, cough, dizziness, fever, runny nose)     Weakness    Patient called in to triage  with complaints of SOB, Not current during call. Intermittent with exertion, she reports it does Not happen often, but wishes to have her Thyroid  checked.   This has been ongoing for x 2 weeks. The patient stated she suspects a UTI due to frequent urination.    Sooner Appointment offered for 1/19, however due to transportation patient will keep previously scheduled appt on 1/20; Patient agrees with the plan of care, and will reach out if symptoms worsen or persist.  Protocols used: Breathing Difficulty-A-AH

## 2024-07-22 NOTE — Telephone Encounter (Signed)
 Apt scheduled.

## 2024-07-26 ENCOUNTER — Ambulatory Visit: Admitting: Family

## 2024-07-26 ENCOUNTER — Encounter: Payer: Self-pay | Admitting: Family

## 2024-07-26 ENCOUNTER — Ambulatory Visit

## 2024-07-26 ENCOUNTER — Ambulatory Visit: Payer: Self-pay | Admitting: Family

## 2024-07-26 VITALS — BP 122/77 | HR 67 | Temp 97.7°F | Ht 66.0 in | Wt 169.0 lb

## 2024-07-26 DIAGNOSIS — E039 Hypothyroidism, unspecified: Secondary | ICD-10-CM

## 2024-07-26 DIAGNOSIS — R0602 Shortness of breath: Secondary | ICD-10-CM

## 2024-07-26 DIAGNOSIS — K219 Gastro-esophageal reflux disease without esophagitis: Secondary | ICD-10-CM | POA: Diagnosis not present

## 2024-07-26 DIAGNOSIS — J301 Allergic rhinitis due to pollen: Secondary | ICD-10-CM | POA: Diagnosis not present

## 2024-07-26 DIAGNOSIS — E663 Overweight: Secondary | ICD-10-CM | POA: Diagnosis not present

## 2024-07-26 DIAGNOSIS — M5136 Other intervertebral disc degeneration, lumbar region with discogenic back pain only: Secondary | ICD-10-CM

## 2024-07-26 DIAGNOSIS — F411 Generalized anxiety disorder: Secondary | ICD-10-CM | POA: Diagnosis not present

## 2024-07-26 DIAGNOSIS — R350 Frequency of micturition: Secondary | ICD-10-CM

## 2024-07-26 DIAGNOSIS — M5416 Radiculopathy, lumbar region: Secondary | ICD-10-CM | POA: Diagnosis not present

## 2024-07-26 DIAGNOSIS — I10 Essential (primary) hypertension: Secondary | ICD-10-CM | POA: Diagnosis not present

## 2024-07-26 DIAGNOSIS — K5901 Slow transit constipation: Secondary | ICD-10-CM | POA: Diagnosis not present

## 2024-07-26 DIAGNOSIS — F1320 Sedative, hypnotic or anxiolytic dependence, uncomplicated: Secondary | ICD-10-CM

## 2024-07-26 DIAGNOSIS — Z79899 Other long term (current) drug therapy: Secondary | ICD-10-CM

## 2024-07-26 LAB — ANEMIA PROFILE B
Basophils Absolute: 0.1 x10E3/uL (ref 0.0–0.2)
Basos: 2 %
EOS (ABSOLUTE): 0.3 x10E3/uL (ref 0.0–0.4)
Eos: 6 %
Ferritin: 42 ng/mL (ref 15–150)
Folate: >20 ng/mL
Hematocrit: 34.4 % (ref 34.0–46.6)
Hemoglobin: 11.1 g/dL (ref 11.1–15.9)
Immature Grans (Abs): 0 x10E3/uL (ref 0.0–0.1)
Immature Granulocytes: 0 %
Iron Saturation: 30 % (ref 15–55)
Iron: 80 ug/dL (ref 27–139)
Lymphocytes Absolute: 1.6 x10E3/uL (ref 0.7–3.1)
Lymphs: 40 %
MCH: 30.8 pg (ref 26.6–33.0)
MCHC: 32.3 g/dL (ref 31.5–35.7)
MCV: 96 fL (ref 79–97)
Monocytes Absolute: 0.4 x10E3/uL (ref 0.1–0.9)
Monocytes: 11 %
Neutrophils Absolute: 1.6 x10E3/uL (ref 1.4–7.0)
Neutrophils: 41 %
Platelets: 232 x10E3/uL (ref 150–450)
RBC: 3.6 x10E6/uL — ABNORMAL LOW (ref 3.77–5.28)
RDW: 12.8 % (ref 11.7–15.4)
Retic Ct Pct: 1 % (ref 0.6–2.6)
Total Iron Binding Capacity: 268 ug/dL (ref 250–450)
UIBC: 188 ug/dL (ref 118–369)
Vitamin B-12: 398 pg/mL (ref 232–1245)
WBC: 3.9 x10E3/uL (ref 3.4–10.8)

## 2024-07-26 LAB — CMP14+EGFR
ALT: 41 IU/L — ABNORMAL HIGH (ref 0–32)
AST: 26 IU/L (ref 0–40)
Albumin: 3.8 g/dL (ref 3.8–4.8)
Alkaline Phosphatase: 74 IU/L (ref 49–135)
BUN/Creatinine Ratio: 22 (ref 12–28)
BUN: 18 mg/dL (ref 8–27)
Bilirubin Total: <0.2 mg/dL (ref 0.0–1.2)
CO2: 23 mmol/L (ref 20–29)
Calcium: 8.4 mg/dL — ABNORMAL LOW (ref 8.7–10.3)
Chloride: 108 mmol/L — ABNORMAL HIGH (ref 96–106)
Creatinine, Ser: 0.81 mg/dL (ref 0.57–1.00)
Globulin, Total: 1.9 g/dL (ref 1.5–4.5)
Glucose: 76 mg/dL (ref 70–99)
Potassium: 4 mmol/L (ref 3.5–5.2)
Sodium: 145 mmol/L — ABNORMAL HIGH (ref 134–144)
Total Protein: 5.7 g/dL — ABNORMAL LOW (ref 6.0–8.5)
eGFR: 73 mL/min/1.73

## 2024-07-26 LAB — URINALYSIS, COMPLETE
Bilirubin, UA: NEGATIVE
Glucose, UA: NEGATIVE
Leukocytes,UA: NEGATIVE
Nitrite, UA: NEGATIVE
Protein,UA: NEGATIVE
RBC, UA: NEGATIVE
Specific Gravity, UA: 1.02 (ref 1.005–1.030)
Urobilinogen, Ur: 0.2 mg/dL (ref 0.2–1.0)
pH, UA: 5.5 (ref 5.0–7.5)

## 2024-07-26 LAB — TSH: TSH: 0.158 u[IU]/mL — ABNORMAL LOW (ref 0.450–4.500)

## 2024-07-26 MED ORDER — CETIRIZINE HCL 10 MG PO TABS: 10.0000 mg | ORAL_TABLET | Freq: Every day | ORAL | 1 refills | Status: AC

## 2024-07-26 NOTE — Progress Notes (Signed)
 "  Subjective:    Patient ID: Abigail Moss, female    DOB: 08/28/1945, 79 y.o.   MRN: [id number]  Chief Complaint  Patient presents with   Fatigue    Patient states her  BP is dropping.   Shortness of Breath    Can not talk and walk at the same time she gives out of breath but she checks her o2 levels or normal.    Pt presents to the office today for chronic follow up.    She is drinking Ensure as needed since COVID she has not been able to eat meats.     She has osteoporosis and takes Fosamax .  Last Dexa scan 08/14/23.    Has chronic back pain and Followed by Ortho. She completed PT.   Complaining of fatigue and SOB at times. Reports SOB with laying down and walking.  Hypertension This is a chronic problem. The current episode started more than 1 year ago. The problem has been resolved since onset. The problem is controlled. Associated symptoms include anxiety, malaise/fatigue, peripheral edema and shortness of breath. Pertinent negatives include no headaches. Risk factors for coronary artery disease include dyslipidemia, sedentary lifestyle and post-menopausal state. Past treatments include angiotensin blockers. The current treatment provides moderate improvement. Identifiable causes of hypertension include a thyroid  problem.  Constipation This is a chronic problem. The current episode started more than 1 year ago. The problem has been resolved since onset. Her stool frequency is 1 time per day. Associated symptoms include back pain. Pertinent negatives include no fever. She has tried laxatives for the symptoms. The treatment provided moderate relief.  Gastroesophageal Reflux She complains of belching and heartburn. She reports no hoarse voice or no sore throat. This is a chronic problem. The current episode started more than 1 year ago. The problem occurs occasionally. The symptoms are aggravated by certain foods. Associated symptoms include fatigue. Risk factors include obesity. She has  tried a PPI for the symptoms. The treatment provided moderate relief.  Thyroid  Problem Presents for follow-up visit. Symptoms include anxiety, constipation, dry skin and fatigue. Patient reports no hoarse voice. The symptoms have been stable.  Back Pain This is a chronic problem. The current episode started more than 1 year ago. The problem occurs intermittently. The problem has been waxing and waning since onset. The pain is present in the lumbar spine. The quality of the pain is described as aching. The pain is at a severity of 7/10. The pain is moderate. The symptoms are aggravated by twisting and bending. Pertinent negatives include no fever or headaches. She has tried analgesics for the symptoms. The treatment provided moderate relief.  Anxiety Presents for follow-up visit. Symptoms include excessive worry, nervous/anxious behavior, restlessness and shortness of breath. Symptoms occur occasionally (caring for her husband). The severity of symptoms is moderate.    Shortness of Breath This is a new problem. The current episode started 1 to 4 weeks ago. The problem occurs intermittently. Pertinent negatives include no fever, headaches, rhinorrhea or sore throat. The symptoms are aggravated by any activity. The treatment provided mild relief.  Urinary Frequency  This is a recurrent problem. The current episode started 1 to 4 weeks ago. The problem occurs intermittently. The problem has been waxing and waning. Associated symptoms include frequency.      Review of Systems  Constitutional:  Positive for fatigue and malaise/fatigue. Negative for fever.  HENT:  Negative for hoarse voice, rhinorrhea and sore throat.   Respiratory:  Positive for shortness  of breath.   Gastrointestinal:  Positive for constipation and heartburn.  Genitourinary:  Positive for frequency.  Musculoskeletal:  Positive for back pain.  Neurological:  Negative for headaches.  Psychiatric/Behavioral:  The patient is  nervous/anxious.   All other systems reviewed and are negative.      Objective:   Physical Exam Vitals reviewed.  Constitutional:      General: She is not in acute distress.    Appearance: She is well-developed.  HENT:     Head: Normocephalic and atraumatic.     Right Ear: Tympanic membrane normal. Drainage present. A PE tube is present.     Left Ear: Tympanic membrane normal. Drainage present. A PE tube is present.  Eyes:     Pupils: Pupils are equal, round, and reactive to light.  Neck:     Thyroid : No thyromegaly.  Cardiovascular:     Rate and Rhythm: Normal rate and regular rhythm.     Heart sounds: Normal heart sounds. No murmur heard. Pulmonary:     Effort: Pulmonary effort is normal. No respiratory distress.     Breath sounds: Normal breath sounds. No wheezing.  Abdominal:     General: Bowel sounds are normal. There is no distension.     Palpations: Abdomen is soft.     Tenderness: There is no abdominal tenderness.  Musculoskeletal:        General: No tenderness. Normal range of motion.     Cervical back: Normal range of motion and neck supple.     Right lower leg: Edema (trace) present.     Left lower leg: Edema (trace) present.  Skin:    General: Skin is warm and dry.  Neurological:     Mental Status: She is alert and oriented to person, place, and time.     Cranial Nerves: No cranial nerve deficit.     Motor: Weakness present.     Gait: Gait abnormal (using cane).     Deep Tendon Reflexes: Reflexes are normal and symmetric.  Psychiatric:        Behavior: Behavior normal.        Thought Content: Thought content normal.        Judgment: Judgment normal.       BP (!) 147/84   Pulse 66   Temp 97.7 F (36.5 C) (Temporal)   Ht 5' 6 (1.676 m)   Wt 169 lb (76.7 kg)   SpO2 97%   BMI 27.28 kg/m      Assessment & Plan:   Abigail Moss comes in today with chief complaint of Fatigue (Patient states her /BP is dropping.) and Shortness of Breath (Can not  talk and walk at the same time she gives out of breath but she checks her o2 levels or normal. )   Diagnosis and orders addressed:  1. Allergic rhinitis due to pollen, unspecified seasonality - cetirizine  (ZYRTEC  ALLERGY) 10 MG tablet; Take 1 tablet (10 mg total) by mouth daily.  Dispense: 90 tablet; Refill: 1 - CMP14+EGFR  2. Benzodiazepine dependence (HCC) - CMP14+EGFR  3. Degeneration of intervertebral disc of lumbar region with discogenic back pain - CMP14+EGFR  4. Slow transit constipation - CMP14+EGFR  5. Overweight (BMI 25.0-29.9) - CMP14+EGFR  6. Lumbar radiculopathy - CMP14+EGFR  7. Hypothyroidism, unspecified type - TSH - CMP14+EGFR  8. Primary hypertension  - CMP14+EGFR - Ambulatory referral to Cardiology  9. Gastroesophageal reflux disease, unspecified whether esophagitis present - CMP14+EGFR  10. GAD (generalized anxiety disorder)  - CMP14+EGFR  11. Controlled substance agreement signed - CMP14+EGFR  12. SOB (shortness of breath) (Primary)  - TSH - CMP14+EGFR - Anemia Profile B - DG Chest 2 View; Future - EKG 12-Lead - Ambulatory referral to Cardiology  13. Urinary frequency - Urinalysis, Complete    Labs pending Patient reviewed in [place] controlled database, no flags noted. Contract and drug screen are up to date.  Will do referral to Cardiologists for SOB Continue current medications  Health Maintenance reviewed Diet and exercise encouraged  Follow up plan: 3 months   Bari Learn, FNP  "

## 2024-07-26 NOTE — Patient Instructions (Signed)
Shortness of Breath, Adult Shortness of breath is when a person has trouble breathing or when a person feels like she or he is having trouble breathing in enough air. Shortness of breath could be a sign of a medical problem. Follow these instructions at home:  Pollutants Do not use any products that contain nicotine or tobacco. These products include cigarettes, chewing tobacco, and vaping devices, such as e-cigarettes. This also includes cigars and pipes. If you need help quitting, ask your health care provider. Avoid things that can irritate your airways, including: Smoke. This includes campfire smoke, forest fire smoke, and secondhand smoke from tobacco products. Do not smoke or allow others to smoke in your home. Mold. Dust. Air pollution. Chemical fumes. Things that can give you an allergic reaction (allergens) if you have allergies. Common allergens include pollen from grasses or trees and animal dander. Keep your living space clean and free of mold and dust. General instructions Pay attention to any changes in your symptoms. Take over-the-counter and prescription medicines only as told by your health care provider. This includes oxygen therapy and inhaled medicines. Rest as needed. Return to your normal activities as told by your health care provider. Ask your health care provider what activities are safe for you. Keep all follow-up visits. This is important. Contact a health care provider if: Your condition does not improve as soon as expected. You have a hard time doing your normal activities, even after you rest. You have new symptoms. You cannot walk up stairs or exercise the way that you normally do. Get help right away if: Your shortness of breath gets worse. You have shortness of breath when you are resting. You feel light-headed or you faint. You have a cough that is not controlled with medicines. You cough up blood. You have pain with breathing. You have pain in your  chest, arms, shoulders, or abdomen. You have a fever. These symptoms may be an emergency. Get help right away. Call 911. Do not wait to see if the symptoms will go away. Do not drive yourself to the hospital. Summary Shortness of breath is when a person has trouble breathing enough air. It can be a sign of a medical problem. Avoid things that irritate your lungs, such as smoking, pollution, mold, and dust. Pay attention to changes in your symptoms and contact your health care provider if you have a hard time completing daily activities because of shortness of breath. This information is not intended to replace advice given to you by your health care provider. Make sure you discuss any questions you have with your health care provider. Document Revised: 02/09/2021 Document Reviewed: 02/09/2021 Elsevier Patient Education  2024 [employer].

## 2024-07-28 MED ORDER — LEVOTHYROXINE SODIUM 125 MCG PO TABS: 125.0000 ug | ORAL_TABLET | Freq: Every day | ORAL | 3 refills | Status: DC

## 2024-08-10 DIAGNOSIS — I4510 Unspecified right bundle-branch block: Secondary | ICD-10-CM | POA: Insufficient documentation

## 2024-08-10 NOTE — Progress Notes (Unsigned)
 "    Cardiology Office Note   Date:  08/11/2024   ID:  [name], [name] 05-Dec-1945, MRN [id number]  PCP:  Lavell Bari LABOR, FNP  Cardiologist:   None Referring:  Lavell Bari LABOR, FNP  Chief Complaint  Patient presents with   Hypertension      History of Present Illness: Abigail Moss is a 79 y.o. female who presents for evaluation of HTN.  I saw her in 2016 for evaluation of RBBB.  She did not need other testing or change in therapy.    She now presents with dizzy spells.  These are happening mostly daily.  It happens when her heart rate is recorded in the 40s.  She says she gets a little dizzy and short of breath and she will take a blood pressure and heart rate and she has noticed heart rates in the 40s.  She has not had any frank syncope.  She has not had any chest discomfort, neck or arm discomfort.  She has not had any PND or orthopnea.  She gets around with a cane.  She takes care of her husband whose got some disability.  She does bring her blood pressure list and they run from the 99 systolic to the 139's.  They are typically running in the 110s.  She has had no orthostatic symptoms but she does move slowly.  Today in the office she was not orthostatic.         Past Medical History:  Diagnosis Date   GERD (gastroesophageal reflux disease)    Hypertension    Osteopenia    Thyroid  disease     Past Surgical History:  Procedure Laterality Date   EAR TUBE REMOVAL     ROTATOR CUFF REPAIR Left    TONSILLECTOMY     VEIN LIGATION AND STRIPPING       Current Outpatient Medications  Medication Sig Dispense Refill   Albuterol  Sulfate (PROAIR  RESPICLICK) 108 (90 Base) MCG/ACT AEPB Inhale 1-2 puffs into the lungs every 6 (six) hours as needed. 1 each 1   alendronate  (FOSAMAX ) 70 MG tablet TAKE 1 TABLET BY MOUTH WEEKLY  WITH 8 OZ OF PLAIN WATER 30  MINUTES BEFORE FIRST FOOD, DRINK OR MEDS. STAY UPRIGHT FOR 30  MINS 12 tablet 3   ALPRAZolam  (XANAX ) 0.5 MG tablet Take 1 tablet (0.5  mg total) by mouth at bedtime as needed. 30 tablet 3   aspirin EC 81 MG tablet Take 81 mg by mouth daily.     Calcium  Citrate-Vitamin D  (CALCIUM  CITRATE + PO) Take 600 mg by mouth daily.     cetirizine  (ZYRTEC  ALLERGY) 10 MG tablet Take 1 tablet (10 mg total) by mouth daily. 90 tablet 1   diclofenac  (VOLTAREN ) 75 MG EC tablet TAKE 1 TABLET BY MOUTH TWICE  DAILY AS NEEDED 200 tablet 2   fluticasone  (FLONASE ) 50 MCG/ACT nasal spray USE 2 SPRAYS IN BOTH NOSTRILS  DAILY 32 g 2   HYDROcodone-acetaminophen  (NORCO) 10-325 MG tablet Take 1 tablet by mouth 3 (three) times daily as needed.     levothyroxine  (SYNTHROID ) 150 MCG tablet Take 150 mcg by mouth daily before breakfast.     losartan  (COZAAR ) 100 MG tablet Take 1 tablet (100 mg total) by mouth daily. 100 tablet 2   montelukast  (SINGULAIR ) 10 MG tablet TAKE 1 TABLET BY MOUTH  DAILY AT BEDTIME 100 tablet 2   MULTIPLE VITAMIN PO Take by mouth.     omeprazole  (PRILOSEC) 40 MG  capsule Take 1 capsule (40 mg total) by mouth daily. 100 capsule 2   rosuvastatin  (CRESTOR ) 5 MG tablet Take 1 tablet (5 mg total) by mouth daily. 90 tablet 3   No current facility-administered medications for this visit.    Allergies:   Baclofen , Elemental sulfur, Gabapentin , Meloxicam , Neomycin , Sulfur, and Latex    Social History:  The patient  reports that she quit smoking about 28 years ago. Her smoking use included cigarettes. She has never used smokeless tobacco. She reports that she does not drink alcohol and does not use drugs.   Family History:  The patient's family history includes COPD in her brother; Cancer in her brother and sister; Diabetes in her father; Heart disease in her sister; Heart disease (age of onset: 37) in her mother; Stroke in her brother.    ROS:  Please see the history of present illness.   Otherwise, review of systems are positive for none.   All other systems are reviewed and negative.    PHYSICAL EXAM: VS:  BP (!) 164/97 (BP Location:  Left Arm, Patient Position: Sitting, Cuff Size: Normal)   Pulse 66   Resp 16   Ht 5' 6 (1.676 m)   SpO2 97%   BMI 27.28 kg/m  , BMI Body mass index is 27.28 kg/m. GENERAL:  Well appearing HEENT:  Pupils equal round and reactive, fundi not visualized, oral mucosa unremarkable NECK:  No jugular venous distention, waveform within normal limits, carotid upstroke brisk and symmetric, no bruits, no thyromegaly LYMPHATICS:  No cervical, inguinal adenopathy LUNGS:  Clear to auscultation bilaterally BACK:  No CVA tenderness CHEST:  Unremarkable HEART:  PMI not displaced or sustained,S1 and S2 within normal limits, no S3, no S4, no clicks, no rubs, no murmurs ABD:  Flat, positive bowel sounds normal in frequency in pitch, no bruits, no rebound, no guarding, no midline pulsatile mass, no hepatomegaly, no splenomegaly EXT:  2 plus pulses throughout, no edema, no cyanosis no clubbing SKIN:  No rashes no nodules NEURO:  Cranial nerves II through XII grossly intact, motor grossly intact throughout [facility]:  Cognitively intact, oriented to person place and time    EKG:    07/26/2024 sinus rhythm, right bundle branch block, rate 63, no acute ST-T wave changes.    Recent Labs: 07/26/2024: ALT 41; BUN 18; Creatinine, Ser 0.81; Hemoglobin 11.1; Platelets 232; Potassium 4.0; Sodium 145; TSH 0.158    Lipid Panel    Component Value Date/Time   CHOL 96 (L) 03/08/2024 1500   TRIG 87 03/08/2024 1500   TRIG 102 11/21/2014 1108   HDL 41 03/08/2024 1500   HDL 51 11/21/2014 1108   CHOLHDL 2.3 03/08/2024 1500   LDLCALC 38 03/08/2024 1500   LDLCALC 108 (H) 04/07/2013 1120      Wt Readings from Last 3 Encounters:  07/26/24 169 lb (76.7 kg)  05/23/24 165 lb (74.8 kg)  03/08/24 174 lb 9.6 oz (79.2 kg)      Other studies Reviewed: Additional studies/ records that were reviewed today include: Labs. Review of the above records demonstrates:  Please see elsewhere in the note.     ASSESSMENT AND  PLAN:  HTN: Her blood pressure somewhat labile.  I am to have her continue to take blood pressure readings and I might reduce her losartan  but for now she remain on the meds as listed.  Bradycardia: I will start with a 3-day monitor.  I suspect she is having symptomatic bradycardia arrhythmias and might need further  therapy.  Should be able to catch symptoms and a bradycardic event with a 3-day monitor but will consider extended monitoring as indicated.  For now she is not on any AV nodal blocking agents.  No change in therapy.  We talked about calling EMS if she has any progressive symptoms such as syncope.  Electrolytes and TSH were normal a few months ago.  Current medicines are reviewed at length with the patient today.  The patient does not have concerns regarding medicines.  The following changes have been made:  no change  Labs/ tests ordered today include:   Orders Placed This Encounter  Procedures   LONG TERM MONITOR (3-14 DAYS)     Disposition:   FU with me in one month.     Signed, Lynwood Schilling, MD  08/11/2024 7:42 PM    [facility]    "

## 2024-08-11 ENCOUNTER — Ambulatory Visit: Admitting: Cardiology

## 2024-08-11 ENCOUNTER — Encounter: Payer: Self-pay | Admitting: Cardiology

## 2024-08-11 ENCOUNTER — Ambulatory Visit

## 2024-08-11 VITALS — BP 164/97 | HR 66 | Resp 16 | Ht 66.0 in

## 2024-08-11 DIAGNOSIS — I4510 Unspecified right bundle-branch block: Secondary | ICD-10-CM

## 2024-08-11 DIAGNOSIS — I10 Essential (primary) hypertension: Secondary | ICD-10-CM | POA: Diagnosis not present

## 2024-08-11 DIAGNOSIS — R001 Bradycardia, unspecified: Secondary | ICD-10-CM

## 2024-08-11 NOTE — Patient Instructions (Addendum)
 Medication Instructions:  Your physician recommends that you continue on your current medications as directed. Please refer to the Current Medication list given to you today.  *If you need a refill on your cardiac medications before your next appointment, please call your pharmacy*  Lab Work: NONE If you have labs (blood work) drawn today and your tests are completely normal, you will receive your results only by: MyChart Message (if you have MyChart) OR A paper copy in the mail If you have any lab test that is abnormal or we need to change your treatment, we will call you to review the results.  Testing/Procedures: 3 Day Zio Heart Monitor Your physician has requested that you wear a Zio heart monitor for __3___ days. This will be mailed to your home with instructions on how to apply the monitor and how to return it when finished. Please allow 2 weeks after returning the heart monitor before our office calls you with the results.   Follow-Up: At [facility], you and your health needs are our priority.  As part of our continuing mission to provide you with exceptional heart care, our providers are all part of one team.  This team includes your primary Cardiologist (physician) and Advanced Practice Providers or APPs (Physician Assistants and Nurse Practitioners) who all work together to provide you with the care you need, when you need it.  Your next appointment:   1 month in [place] (ok to double book per Dr. Lavona)  Provider:   Lavona, MD  We recommend signing up for the patient portal called MyChart.  Sign up information is provided on this After Visit Summary.  MyChart is used to connect with patients for Virtual Visits (Telemedicine).  Patients are able to view lab/test results, encounter notes, upcoming appointments, etc.  Non-urgent messages can be sent to your provider as well.   To learn more about what you can do with MyChart, go to [email or web address].    Other Instructions  ZIO XT- Long Term Monitor Instructions  Your physician has requested you wear a ZIO patch monitor for 3 days.  This is a single patch monitor. [employer] supplies one patch monitor per enrollment. Additional stickers are not available. Please do not apply patch if you will be having a Nuclear Stress Test,  Echocardiogram, Cardiac CT, MRI, or Chest Xray during the period you would be wearing the  monitor. The patch cannot be worn during these tests. You cannot remove and re-apply the  ZIO XT patch monitor.  Your ZIO patch monitor will be mailed 3 day USPS to your address on file. It may take 3-5 days  to receive your monitor after you have been enrolled.  Once you have received your monitor, please review the enclosed instructions. Your monitor  has already been registered assigning a specific monitor serial # to you.  Billing and Patient Assistance Program Information  We have supplied [employer] with any of your insurance information on file for billing purposes. [employer] offers a sliding scale Patient Assistance Program for patients that do not have  insurance, or whose insurance does not completely cover the cost of the ZIO monitor.  You must apply for the Patient Assistance Program to qualify for this discounted rate.  To apply, please call [employer] at [phone], select option 4, select option 2, ask to apply for  Patient Assistance Program. M[place] will ask your household income, and how many people  are in your household. They will quote your out-of-pocket cost based on  that information.  [employer] will also be able to set up a 52-month, interest-free payment plan if needed.  Applying the monitor   Shave hair from upper left chest.  Hold abrader disc by orange tab. Rub abrader in 40 strokes over the upper left chest as  indicated in your monitor instructions.  Clean area with 4 enclosed alcohol pads. Let dry.  Apply patch as indicated in monitor instructions.  Patch will be placed under collarbone on left  side of chest with arrow pointing upward.  Rub patch adhesive wings for 2 minutes. Remove white label marked 1. Remove the white  label marked 2. Rub patch adhesive wings for 2 additional minutes.  While looking in a mirror, press and release button in center of patch. A small green light will  flash 3-4 times. This will be your only indicator that the monitor has been turned on.  Do not shower for the first 24 hours. You may shower after the first 24 hours.  Press the button if you feel a symptom. You will hear a small click. Record Date, Time and  Symptom in the Patient Logbook.  When you are ready to remove the patch, follow instructions on the last 2 pages of Patient  Logbook. Stick patch monitor onto the last page of Patient Logbook.  Place Patient Logbook in the blue and white box. Use locking tab on box and tape box closed  securely. The blue and white box has prepaid postage on it. Please place it in the mailbox as  soon as possible. Your physician should have your test results approximately 7 days after the  monitor has been mailed back to [facility].  Call [facility] Customer Care at [phone] if you have questions regarding  your ZIO XT patch monitor. Call them immediately if you see an orange light blinking on your  monitor.  If your monitor falls off in less than 4 days, contact our Monitor department at [phone].  If your monitor becomes loose or falls off after 4 days call [employer] at [phone] for  suggestions on securing your monitor

## 2024-08-11 NOTE — Progress Notes (Unsigned)
 Enrolled for [employer] to mail a ZIO XT long term holter monitor to the patients address on file.

## 2024-09-21 ENCOUNTER — Ambulatory Visit: Admitting: Cardiology

## 2024-10-12 ENCOUNTER — Ambulatory Visit: Admitting: Cardiology

## 2025-03-06 ENCOUNTER — Ambulatory Visit: Payer: Self-pay
